# Patient Record
Sex: Female | Born: 1935 | Race: White | Hispanic: No | Marital: Single | Smoking: Former smoker
Health system: Southern US, Community
[De-identification: ages and names within clinical notes are randomized; demographics above are authoritative.]

## PROBLEM LIST (undated history)

## (undated) DIAGNOSIS — H9190 Unspecified hearing loss, unspecified ear: Secondary | ICD-10-CM

## (undated) DIAGNOSIS — R002 Palpitations: Secondary | ICD-10-CM

## (undated) DIAGNOSIS — R413 Other amnesia: Secondary | ICD-10-CM

## (undated) DIAGNOSIS — S32599A Other specified fracture of unspecified pubis, initial encounter for closed fracture: Secondary | ICD-10-CM

## (undated) DIAGNOSIS — M79671 Pain in right foot: Secondary | ICD-10-CM

## (undated) DIAGNOSIS — H3530 Unspecified macular degeneration: Secondary | ICD-10-CM

## (undated) DIAGNOSIS — T63441A Toxic effect of venom of bees, accidental (unintentional), initial encounter: Secondary | ICD-10-CM

## (undated) DIAGNOSIS — E785 Hyperlipidemia, unspecified: Secondary | ICD-10-CM

## (undated) DIAGNOSIS — K5900 Constipation, unspecified: Secondary | ICD-10-CM

## (undated) DIAGNOSIS — I480 Paroxysmal atrial fibrillation: Secondary | ICD-10-CM

## (undated) DIAGNOSIS — M1611 Unilateral primary osteoarthritis, right hip: Secondary | ICD-10-CM

## (undated) DIAGNOSIS — E041 Nontoxic single thyroid nodule: Secondary | ICD-10-CM

## (undated) DIAGNOSIS — I2510 Atherosclerotic heart disease of native coronary artery without angina pectoris: Secondary | ICD-10-CM

## (undated) DIAGNOSIS — R42 Dizziness and giddiness: Secondary | ICD-10-CM

## (undated) DIAGNOSIS — I10 Essential (primary) hypertension: Secondary | ICD-10-CM

## (undated) DIAGNOSIS — M810 Age-related osteoporosis without current pathological fracture: Secondary | ICD-10-CM

## (undated) DIAGNOSIS — K121 Other forms of stomatitis: Secondary | ICD-10-CM

## (undated) HISTORY — DX: Other amnesia: R41.3

## (undated) HISTORY — DX: Nontoxic single thyroid nodule: E04.1

## (undated) HISTORY — DX: Constipation, unspecified: K59.00

## (undated) HISTORY — PX: FOOT SURGERY: SHX648

## (undated) HISTORY — DX: Age-related osteoporosis without current pathological fracture: M81.0

## (undated) HISTORY — DX: Palpitations: R00.2

## (undated) HISTORY — PX: COLONOSCOPY: SHX174

## (undated) HISTORY — DX: Paroxysmal atrial fibrillation: I48.0

## (undated) HISTORY — DX: Hyperlipidemia, unspecified: E78.5

## (undated) HISTORY — DX: Pain in right foot: M79.671

## (undated) HISTORY — DX: Unilateral primary osteoarthritis, right hip: M16.11

## (undated) HISTORY — DX: Essential (primary) hypertension: I10

## (undated) HISTORY — DX: Atherosclerotic heart disease of native coronary artery without angina pectoris: I25.10

## (undated) HISTORY — DX: Unspecified macular degeneration: H35.30

## (undated) HISTORY — DX: Other specified fracture of unspecified pubis, initial encounter for closed fracture: S32.599A

## (undated) HISTORY — PX: KNEE SURGERY: SHX244

## (undated) HISTORY — DX: Other forms of stomatitis: K12.1

---
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
## 1966-11-21 HISTORY — PX: ABDOMINAL HYSTERECTOMY: SHX81

## 2011-07-08 ENCOUNTER — Encounter (HOSPITAL_COMMUNITY): Payer: Medicare Other | Attending: Orthopedic Surgery

## 2011-07-08 DIAGNOSIS — Z79899 Other long term (current) drug therapy: Secondary | ICD-10-CM | POA: Insufficient documentation

## 2011-07-08 DIAGNOSIS — M810 Age-related osteoporosis without current pathological fracture: Secondary | ICD-10-CM | POA: Insufficient documentation

## 2011-07-08 DIAGNOSIS — E7800 Pure hypercholesterolemia, unspecified: Secondary | ICD-10-CM | POA: Insufficient documentation

## 2012-02-16 ENCOUNTER — Other Ambulatory Visit: Payer: Self-pay | Admitting: Family Medicine

## 2012-02-16 DIAGNOSIS — Z1231 Encounter for screening mammogram for malignant neoplasm of breast: Secondary | ICD-10-CM

## 2012-02-28 ENCOUNTER — Ambulatory Visit
Admission: RE | Admit: 2012-02-28 | Discharge: 2012-02-28 | Disposition: A | Discharge status: Home / Self Care | Payer: Medicare Other | Source: Ambulatory Visit | Attending: Family Medicine | Admitting: Family Medicine

## 2012-02-28 DIAGNOSIS — Z1231 Encounter for screening mammogram for malignant neoplasm of breast: Secondary | ICD-10-CM

## 2012-08-01 ENCOUNTER — Other Ambulatory Visit (HOSPITAL_COMMUNITY): Payer: Self-pay | Admitting: *Deleted

## 2012-08-03 ENCOUNTER — Encounter (HOSPITAL_COMMUNITY): Admission: RE | Admit: 2012-08-03 | Payer: Medicare Other | Source: Ambulatory Visit

## 2012-10-04 ENCOUNTER — Ambulatory Visit (HOSPITAL_COMMUNITY)
Admission: RE | Admit: 2012-10-04 | Discharge: 2012-10-04 | Disposition: A | Discharge status: Home / Self Care | Payer: Medicare Other | Source: Ambulatory Visit | Attending: Orthopedic Surgery | Admitting: Orthopedic Surgery

## 2012-10-04 ENCOUNTER — Encounter (HOSPITAL_COMMUNITY): Payer: Self-pay

## 2012-10-04 DIAGNOSIS — M810 Age-related osteoporosis without current pathological fracture: Secondary | ICD-10-CM | POA: Insufficient documentation

## 2012-10-04 DIAGNOSIS — S72009A Fracture of unspecified part of neck of unspecified femur, initial encounter for closed fracture: Secondary | ICD-10-CM | POA: Insufficient documentation

## 2012-10-04 DIAGNOSIS — X58XXXA Exposure to other specified factors, initial encounter: Secondary | ICD-10-CM | POA: Insufficient documentation

## 2012-10-04 DIAGNOSIS — M899 Disorder of bone, unspecified: Secondary | ICD-10-CM | POA: Insufficient documentation

## 2012-10-04 MED ORDER — SODIUM CHLORIDE 0.9 % IV SOLN
Freq: Once | INTRAVENOUS | Status: AC
  Administered 2012-10-04: 12:00:00 via INTRAVENOUS

## 2012-10-04 MED ORDER — ZOLEDRONIC ACID 5 MG/100ML IV SOLN
5.0000 mg | Freq: Once | INTRAVENOUS | Status: AC
  Administered 2012-10-04: 5 mg via INTRAVENOUS
  Filled 2012-10-04: qty 100

## 2012-10-07 ENCOUNTER — Emergency Department (HOSPITAL_COMMUNITY): Payer: Medicare Other

## 2012-10-07 ENCOUNTER — Emergency Department (HOSPITAL_COMMUNITY)
Admission: EM | Admit: 2012-10-07 | Discharge: 2012-10-08 | Disposition: A | Discharge status: Home / Self Care | Payer: Medicare Other | Attending: Emergency Medicine | Admitting: Emergency Medicine

## 2012-10-07 DIAGNOSIS — S81009A Unspecified open wound, unspecified knee, initial encounter: Secondary | ICD-10-CM | POA: Insufficient documentation

## 2012-10-07 DIAGNOSIS — Y9301 Activity, walking, marching and hiking: Secondary | ICD-10-CM | POA: Insufficient documentation

## 2012-10-07 DIAGNOSIS — W1789XA Other fall from one level to another, initial encounter: Secondary | ICD-10-CM | POA: Insufficient documentation

## 2012-10-07 DIAGNOSIS — W19XXXA Unspecified fall, initial encounter: Secondary | ICD-10-CM

## 2012-10-07 DIAGNOSIS — S0003XA Contusion of scalp, initial encounter: Secondary | ICD-10-CM | POA: Insufficient documentation

## 2012-10-07 DIAGNOSIS — Z79899 Other long term (current) drug therapy: Secondary | ICD-10-CM | POA: Insufficient documentation

## 2012-10-07 DIAGNOSIS — S61409A Unspecified open wound of unspecified hand, initial encounter: Secondary | ICD-10-CM | POA: Insufficient documentation

## 2012-10-07 DIAGNOSIS — S46909A Unspecified injury of unspecified muscle, fascia and tendon at shoulder and upper arm level, unspecified arm, initial encounter: Secondary | ICD-10-CM | POA: Insufficient documentation

## 2012-10-07 DIAGNOSIS — IMO0002 Reserved for concepts with insufficient information to code with codable children: Secondary | ICD-10-CM

## 2012-10-07 DIAGNOSIS — Z87891 Personal history of nicotine dependence: Secondary | ICD-10-CM | POA: Insufficient documentation

## 2012-10-07 DIAGNOSIS — S0990XA Unspecified injury of head, initial encounter: Secondary | ICD-10-CM | POA: Insufficient documentation

## 2012-10-07 DIAGNOSIS — S81809A Unspecified open wound, unspecified lower leg, initial encounter: Secondary | ICD-10-CM | POA: Insufficient documentation

## 2012-10-07 DIAGNOSIS — Z23 Encounter for immunization: Secondary | ICD-10-CM | POA: Insufficient documentation

## 2012-10-07 DIAGNOSIS — S5010XA Contusion of unspecified forearm, initial encounter: Secondary | ICD-10-CM | POA: Insufficient documentation

## 2012-10-07 DIAGNOSIS — S4980XA Other specified injuries of shoulder and upper arm, unspecified arm, initial encounter: Secondary | ICD-10-CM | POA: Insufficient documentation

## 2012-10-07 DIAGNOSIS — R51 Headache: Secondary | ICD-10-CM | POA: Insufficient documentation

## 2012-10-07 DIAGNOSIS — Y929 Unspecified place or not applicable: Secondary | ICD-10-CM | POA: Insufficient documentation

## 2012-10-07 DIAGNOSIS — S1093XA Contusion of unspecified part of neck, initial encounter: Secondary | ICD-10-CM | POA: Insufficient documentation

## 2012-10-07 MED ORDER — TETANUS-DIPHTH-ACELL PERTUSSIS 5-2.5-18.5 LF-MCG/0.5 IM SUSP
0.5000 mL | Freq: Once | INTRAMUSCULAR | Status: AC
  Administered 2012-10-07: 0.5 mL via INTRAMUSCULAR
  Filled 2012-10-07: qty 0.5

## 2012-10-07 NOTE — ED Notes (Signed)
Pt complaining of right groin twitching.  Injuries assessed with Dr. Jeraldine Loots, bleeding controlled at present.  Pt alert and oriented X 4.

## 2012-10-07 NOTE — ED Provider Notes (Signed)
History     CSN: [id number]  Arrival date & time 10/07/12  2051   First MD Initiated Contact with Patient 10/07/12 2110      Chief Complaint  Patient presents with  . Fall  . Laceration    (Consider location/radiation/quality/duration/timing/severity/associated sxs/prior treatment) HPI Comments: The patient states she was "racing" down the stairs and lost her footing. She states she slid down 6 stairs and skinned bilateral shins and bruised her arms. She also hit her head. She denies loss of consciousness. She complains of a mild headache but more so throbbing of her left leg.  Patient is a 78 y.o. female presenting with fall and skin laceration. The history is provided by the patient. No language interpreter was used.  Fall The accident occurred 1 to 2 hours ago. The fall occurred while walking. She fell from a height of 3 to 5 ft. She landed on a hard floor. The volume of blood lost was minimal. The point of impact was the head (Bilateral shins and forearms). The pain is present in the head (Head, bilateral shins and forearms, left shoulder). The pain is at a severity of 7/10. The pain is moderate. She was ambulatory at the scene. There was no entrapment after the fall. There was no drug use involved in the accident. There was no alcohol use involved in the accident. Associated symptoms include headaches. Pertinent negatives include no visual change, no fever, no abdominal pain, no bowel incontinence, no nausea, no vomiting and no loss of consciousness. The symptoms are aggravated by pressure on the injury. Treatment on scene includes a c-collar and a backboard. She has tried nothing for the symptoms. The treatment provided no relief.  Laceration     No past medical history on file.  No past surgical history on file.  No family history on file.  History  Substance Use Topics  . Smoking status: Former Smoker -- 0.5 packs/day    Types: Cigarettes    Quit date: 10/05/1971  .  Smokeless tobacco: Not on file  . Alcohol Use: No    OB History    Grav Para Term Preterm Abortions TAB SAB Ect Mult Living                  Review of Systems  Constitutional: Negative for fever, chills, activity change and appetite change.  HENT: Negative for congestion, rhinorrhea, neck pain, neck stiffness and sinus pressure.   Eyes: Negative for discharge and visual disturbance.  Respiratory: Negative for cough, chest tightness, shortness of breath, wheezing and stridor.   Cardiovascular: Negative for chest pain and leg swelling.  Gastrointestinal: Negative for nausea, vomiting, abdominal pain, diarrhea, abdominal distention and bowel incontinence.  Genitourinary: Negative for decreased urine volume and difficulty urinating.  Musculoskeletal: Positive for arthralgias (right shoulder). Negative for back pain.  Skin: Negative for color change and pallor.  Neurological: Positive for headaches. Negative for loss of consciousness, weakness and light-headedness.  Psychiatric/Behavioral: Negative for behavioral problems and agitation.  All other systems reviewed and are negative.    Allergies  Penicillins  Home Medications   Current Outpatient Rx  Name  Route  Sig  Dispense  Refill  . BIOTIN 10 MG PO CAPS   Oral   Take 1 tablet by mouth daily.         Marland Kitchen CALCIUM CARBONATE-VITAMIN D 250-125 MG-UNIT PO TABS   Oral   Take 1 tablet by mouth daily.         . ADULT  MULTIVITAMIN W/MINERALS CH   Oral   Take 1 tablet by mouth daily.         Marland Kitchen PRESERVISION AREDS 2 PO   Oral   Take 2 capsules by mouth daily.         Marland Kitchen SIMVASTATIN 20 MG PO TABS   Oral   Take 20 mg by mouth daily.            BP 173/91  Pulse 79  Temp 98.2 F (36.8 C) (Oral)  Resp 20  SpO2 98%  Physical Exam  Nursing note and vitals reviewed. Constitutional: She is oriented to person, place, and time. She appears well-developed and well-nourished. No distress.  HENT:  Head: Normocephalic.    Mouth/Throat: No oropharyngeal exudate.       Moderate-sized hematoma just above the left lateral eyebrow on the patient's for head. Superficial abrasion over hematoma.  Eyes: EOM are normal. Pupils are equal, round, and reactive to light. Right eye exhibits no discharge. Left eye exhibits no discharge.  Neck: Neck supple. No JVD present.       Immobilizer and c-collar. No tenderness or step-offs.  Cardiovascular: Normal rate, regular rhythm and normal heart sounds.   Pulmonary/Chest: Effort normal and breath sounds normal. No stridor. No respiratory distress. She exhibits no tenderness.  Abdominal: Soft. Bowel sounds are normal. She exhibits no distension. There is no tenderness. There is no guarding.  Musculoskeletal: She exhibits tenderness. She exhibits no edema.       Tenderness over the lateral and anterior left shoulder. Decreased range of motion secondary to pain. Sensation intact throughout. Superficial 2 cm laceration over dorsum of right hand. Hematoma over the mid dorsal left forearm. Superficial abrasion to left elbow. Large 15-20 cm laceration over anterior right lower leg involving soft tissue. 3 cm laceration over anterior right lower leg that is superficial.  Neurological: She is alert and oriented to person, place, and time. No cranial nerve deficit. She exhibits normal muscle tone.  Skin: Skin is warm and dry. No rash noted. She is not diaphoretic.  Psychiatric: She has a normal mood and affect. Her behavior is normal. Judgment and thought content normal.    ED Course  LACERATION REPAIR Performed by: Warrick Parisian Authorized by: Warrick Parisian Consent: Verbal consent obtained. Body area: lower extremity Location details: right lower leg Laceration length: 15 cm Foreign bodies: no foreign bodies Tendon involvement: none Nerve involvement: none Vascular damage: no Anesthesia: local infiltration Local anesthetic: lidocaine 2% without epinephrine Anesthetic total: 25  ml Patient sedated: no Preparation: Patient was prepped and draped in the usual sterile fashion. Irrigation solution: saline Irrigation method: jet lavage Amount of cleaning: extensive Degree of undermining: none Skin closure: 3-0 Prolene Fascia closure: 4-0 Vicryl Number of sutures: 25 Technique: running Approximation: close Approximation difficulty: complex Patient tolerance: Patient tolerated the procedure well with no immediate complications.   (including critical care time)  Labs Reviewed - No data to display Dg Tibia/fibula Left  10/07/2012  *RADIOLOGY REPORT*  Clinical Data: Lacerations and pain at bilateral lower legs post fall  LEFT TIBIA AND FIBULA - 2 VIEW  Comparison: None  Findings: Osseous demineralization. Degenerative changes left knee with chondrocalcinosis question CPPD. Question osteochondral defect at the medial femoral condyle. No acute fracture, dislocation or bone destruction. No definite soft tissue gas or radiopaque foreign body.  IMPRESSION: No acute abnormalities. Osseous demineralization with degenerative changes question CPPD left knee. Question old osteochondral defect at the medial femoral condyle.   Original Report  Authenticated By: Ulyses Southward, M.D.    Dg Tibia/fibula Right  10/07/2012  *RADIOLOGY REPORT*  Clinical Data:  Pain and lacerations at the anterior aspect of bilateral lower legs post fall  RIGHT TIBIA AND FIBULA - 2 VIEW  Comparison: None  Findings: Dressing artifacts at proximal right lower leg and at ankle. Scattered foci of soft tissue gas laterally at the proximal lower leg. Bones appear diffusely demineralized. Chondrocalcinosis at knee. No acute fracture, dislocation, or bone destruction. No radiopaque foreign bodies identified.  IMPRESSION: Osseous demineralization. Soft tissue gas at lateral aspect of proximal right lower leg consistent with laceration history. Chondrocalcinosis at right knee question CPPD.   Original Report Authenticated By:  Ulyses Southward, M.D.    Ct Head Wo Contrast  10/07/2012  *RADIOLOGY REPORT*  Clinical Data:  Fall down stairs, left frontal hematoma  CT HEAD WITHOUT CONTRAST CT CERVICAL SPINE WITHOUT CONTRAST  Technique:  Multidetector CT imaging of the head and cervical spine was performed following the standard protocol without intravenous contrast.  Multiplanar CT image reconstructions of the cervical spine were also generated.  Comparison:  None  CT HEAD  Findings: Generalized atrophy. Normal ventricular morphology. No midline shift or mass effect. Minimal small vessel chronic ischemic changes of deep cerebral white matter. No intracranial hemorrhage, mass lesion or evidence of acute infarction. No definite extra-axial fluid collections. Large left frontal scalp hematoma. Atherosclerotic calcifications at the internal carotid and vertebral arteries at skull base. Bones appear diffusely demineralized. Calvaria appear intact.  IMPRESSION: Atrophy with minimal small vessel chronic ischemic changes of deep cerebral white matter. No acute intracranial abnormalities.  CT CERVICAL SPINE  Findings: Osseous demineralization. Multilevel disc space narrowing and endplate spur formation greatest at C5-C6. Diffuse multilevel facet degenerative changes bilaterally. Visualized skull base intact. Pannus with calcification identified adjacent to the odontoid process. Vertebral body heights maintained without fracture or subluxation. Prevertebral soft tissues normal thickness. Scattered distal calcification. Small bilateral thyroid nodules, with a nodule measuring 12 mm at inferior pole left lobe. Scattered atherosclerotic calcifications of the carotid arteries bilaterally.  IMPRESSION:  Multilevel degenerative disc and facet disease changes of the cervical spine. No acute cervical spine abnormalities. Multiple bilateral thyroid nodules, with at least one nodule measuring 12 mm diameter; follow-up dedicated non emergent thyroid ultrasound  recommended.   Original Report Authenticated By: Ulyses Southward, M.D.    Ct Cervical Spine Wo Contrast  10/07/2012  *RADIOLOGY REPORT*  Clinical Data:  Fall down stairs, left frontal hematoma  CT HEAD WITHOUT CONTRAST CT CERVICAL SPINE WITHOUT CONTRAST  Technique:  Multidetector CT imaging of the head and cervical spine was performed following the standard protocol without intravenous contrast.  Multiplanar CT image reconstructions of the cervical spine were also generated.  Comparison:  None  CT HEAD  Findings: Generalized atrophy. Normal ventricular morphology. No midline shift or mass effect. Minimal small vessel chronic ischemic changes of deep cerebral white matter. No intracranial hemorrhage, mass lesion or evidence of acute infarction. No definite extra-axial fluid collections. Large left frontal scalp hematoma. Atherosclerotic calcifications at the internal carotid and vertebral arteries at skull base. Bones appear diffusely demineralized. Calvaria appear intact.  IMPRESSION: Atrophy with minimal small vessel chronic ischemic changes of deep cerebral white matter. No acute intracranial abnormalities.  CT CERVICAL SPINE  Findings: Osseous demineralization. Multilevel disc space narrowing and endplate spur formation greatest at C5-C6. Diffuse multilevel facet degenerative changes bilaterally. Visualized skull base intact. Pannus with calcification identified adjacent to the odontoid process. Vertebral body heights maintained without  fracture or subluxation. Prevertebral soft tissues normal thickness. Scattered distal calcification. Small bilateral thyroid nodules, with a nodule measuring 12 mm at inferior pole left lobe. Scattered atherosclerotic calcifications of the carotid arteries bilaterally.  IMPRESSION:  Multilevel degenerative disc and facet disease changes of the cervical spine. No acute cervical spine abnormalities. Multiple bilateral thyroid nodules, with at least one nodule measuring 12 mm  diameter; follow-up dedicated non emergent thyroid ultrasound recommended.   Original Report Authenticated By: Ulyses Southward, M.D.    Dg Shoulder Left  10/07/2012  *RADIOLOGY REPORT*  Clinical Data: Fall, laceration, left shoulder pain.  LEFT SHOULDER - 2+ VIEW  Comparison:  None  Findings: Degraded by overlying artifact. No displaced fracture.  A subtle fracture could be obscured by this artifact.  No dislocation of the glenohumeral joint.  The acromioclavicular joint is mildly widened with associated degenerative change.  Left upper lung is predominately clear.  IMPRESSION: Degraded by overlying artifact.  No dislocation or displaced fracture. Consider repeat radiographs if clinical concern for subtle osseous injury persists.  Mild AC joint degenerative change and widening is not favored to be acute however correlate with point tenderness.   Original Report Authenticated By: Jearld Lesch, M.D.      1. Fall   2. Laceration   3. Scalp contusion       MDM  9:25 PM s/p mech fall. Will get CTH, CT csp, Xrays L shoulder, bilat tib fib. Tdap given. Neuro intact, alert. Denies LOC or syncope.  12:36 AM  Tolerated repair well. Imaging neg. proph abx due to depth of R leg wound. Gave strict return and infection precautions. Pt deemed stable for discharge. Return precautions were provided and pt expressed understanding to return to ED if any acute symptoms return. Follow up was instructed which pt also expressed understanding. All questions were answered and pt was in agreement w/ plan.       Warrick Parisian, MD 10/08/12 [phone]

## 2012-10-07 NOTE — ED Notes (Signed)
Pt arrived via [facility] from [facility]. She was walking down stairs when she tripped. Avulsion on right anterior lower extremity, and skin tear on lower left extremity, hmatoma 3 inch diameter on left side forehead with LAC, and skin tear left elbow. Denies N/V, blood thinners or pain unless you touch her. NKDA.

## 2012-10-08 MED ORDER — SODIUM CHLORIDE 0.9 % IV SOLN
Freq: Once | INTRAVENOUS | Status: AC
  Administered 2012-10-08: 01:00:00 via INTRAVENOUS

## 2012-10-08 MED ORDER — HYDROCODONE-ACETAMINOPHEN 5-325 MG PO TABS: 1.0000 | ORAL_TABLET | Freq: Four times a day (QID) | ORAL | Status: DC | PRN

## 2012-10-08 MED ORDER — CEPHALEXIN 250 MG PO CAPS: 250.0000 mg | ORAL_CAPSULE | Freq: Three times a day (TID) | ORAL | Status: AC

## 2012-10-08 NOTE — ED Notes (Signed)
Helped pt to get dressed, IV removed, [facility] called for transport.

## 2012-10-08 NOTE — ED Notes (Signed)
Pt's residence [facility] called, spoke with a nurse, Lynden Ang about injuries and treatments that pt received in the ED.  Asked to assist patient into apartment and check on her throughout the night.

## 2012-10-10 ENCOUNTER — Encounter (HOSPITAL_COMMUNITY): Payer: Self-pay

## 2012-10-10 ENCOUNTER — Emergency Department (HOSPITAL_COMMUNITY)
Admission: EM | Admit: 2012-10-10 | Discharge: 2012-10-10 | Disposition: A | Discharge status: Home / Self Care | Payer: Medicare Other | Attending: Emergency Medicine | Admitting: Emergency Medicine

## 2012-10-10 DIAGNOSIS — X58XXXA Exposure to other specified factors, initial encounter: Secondary | ICD-10-CM | POA: Insufficient documentation

## 2012-10-10 DIAGNOSIS — T8133XA Disruption of traumatic injury wound repair, initial encounter: Secondary | ICD-10-CM | POA: Insufficient documentation

## 2012-10-10 DIAGNOSIS — Z79899 Other long term (current) drug therapy: Secondary | ICD-10-CM | POA: Insufficient documentation

## 2012-10-10 DIAGNOSIS — Y929 Unspecified place or not applicable: Secondary | ICD-10-CM | POA: Insufficient documentation

## 2012-10-10 DIAGNOSIS — Z87891 Personal history of nicotine dependence: Secondary | ICD-10-CM | POA: Insufficient documentation

## 2012-10-10 DIAGNOSIS — T8130XA Disruption of wound, unspecified, initial encounter: Secondary | ICD-10-CM

## 2012-10-10 DIAGNOSIS — Y939 Activity, unspecified: Secondary | ICD-10-CM | POA: Insufficient documentation

## 2012-10-10 NOTE — ED Provider Notes (Signed)
History    This chart was scribed for Cheri Guppy, MD, MD by Smitty Pluck, ED Scribe. The patient was seen in room TR07C and the patient's care was started at 11:30AM.   CSN: [id number]  Arrival date & time 10/10/12  1008     Chief Complaint  Patient presents with  . Repair stitches     (Consider location/radiation/quality/duration/timing/severity/associated sxs/prior treatment) The history is provided by the patient. No language interpreter was used.   Candice Willis is a 78 y.o. female who presents to the Emergency Department due to suture recheck. Pt had sutures placed on right shin 4 days ago. She was worried that some sutures had come out and that it was not healing properly. She denies any fever, chills, nausea, vomiting and any other symptoms.   No past medical history on file.  Past Surgical History  Procedure Date  . Appendectomy   . Abdominal hysterectomy     No family history on file.  History  Substance Use Topics  . Smoking status: Former Smoker -- 0.5 packs/day    Types: Cigarettes    Quit date: 10/05/1971  . Smokeless tobacco: Not on file  . Alcohol Use: Yes     Comment: occasional    OB History    Grav Para Term Preterm Abortions TAB SAB Ect Mult Living                  Review of Systems  Constitutional: Negative for fever and chills.  Respiratory: Negative for shortness of breath.   Gastrointestinal: Negative for nausea and vomiting.  Neurological: Negative for weakness.  All other systems reviewed and are negative.    Allergies  Penicillins  Home Medications   Current Outpatient Rx  Name  Route  Sig  Dispense  Refill  . BIOTIN 10 MG PO CAPS   Oral   Take 1 tablet by mouth daily.         Marland Kitchen CALCIUM CARBONATE-VITAMIN D 250-125 MG-UNIT PO TABS   Oral   Take 1 tablet by mouth daily.         . CEPHALEXIN 250 MG PO CAPS   Oral   Take 1 capsule (250 mg total) by mouth 3 (three) times daily.   15 capsule   0   .  HYDROCODONE-ACETAMINOPHEN 5-325 MG PO TABS   Oral   Take 1 tablet by mouth every 6 (six) hours as needed for pain.   10 tablet   0   . ADULT MULTIVITAMIN W/MINERALS CH   Oral   Take 1 tablet by mouth daily.         Idolina Primer PRESERVISION PO   Oral   Take 1 tablet by mouth 2 (two) times daily.         Marland Kitchen SIMVASTATIN 20 MG PO TABS   Oral   Take 20 mg by mouth every morning.            BP 128/74  Pulse 81  Temp 97.7 F (36.5 C) (Oral)  Resp 18  Ht 5\' 3"  (1.6 m)  Wt 125 lb (56.7 kg)  BMI 22.14 kg/m2  SpO2 97%  Physical Exam  Nursing note and vitals reviewed. Constitutional: She is oriented to person, place, and time. She appears well-developed and well-nourished. No distress.  HENT:  Head: Normocephalic and atraumatic.  Eyes: EOM are normal.  Neck: Neck supple. No tracheal deviation present.  Cardiovascular: Normal rate.   Pulmonary/Chest: Effort normal. No respiratory distress.  Musculoskeletal:  Normal range of motion.  Neurological: She is alert and oriented to person, place, and time.  Skin: Skin is warm and dry.       Healing wound bilaterally  3 stitches have fallen out of laceration on right shin showing slight dehiscence  No evidence of infection   Psychiatric: She has a normal mood and affect. Her behavior is normal.    ED Course  Procedures (including critical care time) DIAGNOSTIC STUDIES: Oxygen Saturation is 97% on room air, normal by my interpretation.    COORDINATION OF CARE: 11:33 AM Discussed ED treatment with pt     Labs Reviewed - No data to display No results found.   No diagnosis found.    MDM  Healing wounds with mild dehiscence in right leg wound.  Dehiscence is several days old. Can't be closed and dose not need closure. No signs infection      I personally performed the services described in this documentation, which was scribed in my presence. The recorded information has been reviewed and is accurate.    Cheri Guppy, MD 10/10/12 [phone]

## 2012-10-10 NOTE — ED Provider Notes (Signed)
  I performed a history and physical examination of Candice Willis and discussed her management with Dr. Ambrose Mantle.  I agree with the history, physical, assessment, and plan of care, with the following exceptions: None  On my exam the patient was in no distress.  There are no neurologic deficits.  The patient did have wounds to all extremities.  With Dr. Ambrose Mantle we repaired her most prominent laceration of her right lower extremity, please see his documentation.  Additionally, the patient required Steri-Strips and gauze dressing of several other wounds.  All procedures were tolerated well.  She was discharged in stable condition.  Elyse Jarvis, MD 10/10/12 [phone]

## 2012-10-10 NOTE — ED Notes (Signed)
Right shin wound has a 2cm part that has no stitches and is open. No drainage or odor noted from wound. Other stitches in place.

## 2012-10-10 NOTE — ED Notes (Signed)
Pt had stitches placed on her right leg and was told by her nurse that she needs to be seen because it looks like the stitches are coming out.

## 2012-10-23 ENCOUNTER — Other Ambulatory Visit: Payer: Self-pay | Admitting: Nurse Practitioner

## 2012-10-23 DIAGNOSIS — E041 Nontoxic single thyroid nodule: Secondary | ICD-10-CM

## 2012-10-25 ENCOUNTER — Ambulatory Visit
Admission: RE | Admit: 2012-10-25 | Discharge: 2012-10-25 | Disposition: A | Discharge status: Home / Self Care | Payer: Medicare Other | Source: Ambulatory Visit | Attending: Nurse Practitioner | Admitting: Nurse Practitioner

## 2012-10-25 DIAGNOSIS — E041 Nontoxic single thyroid nodule: Secondary | ICD-10-CM

## 2012-11-05 ENCOUNTER — Other Ambulatory Visit (HOSPITAL_COMMUNITY): Payer: Self-pay | Admitting: Cardiovascular Disease

## 2012-11-05 DIAGNOSIS — I6529 Occlusion and stenosis of unspecified carotid artery: Secondary | ICD-10-CM

## 2012-12-10 ENCOUNTER — Telehealth (INDEPENDENT_AMBULATORY_CARE_PROVIDER_SITE_OTHER): Payer: Self-pay | Admitting: General Surgery

## 2012-12-10 ENCOUNTER — Encounter (INDEPENDENT_AMBULATORY_CARE_PROVIDER_SITE_OTHER): Payer: Self-pay | Admitting: General Surgery

## 2012-12-10 NOTE — Telephone Encounter (Signed)
Called to req recds again from Dr.Jones office [phone] but no answer and had to leave message to fax them to Candice Willis's attn

## 2012-12-12 ENCOUNTER — Ambulatory Visit (INDEPENDENT_AMBULATORY_CARE_PROVIDER_SITE_OTHER): Payer: Medicare Other | Admitting: General Surgery

## 2012-12-12 ENCOUNTER — Encounter (INDEPENDENT_AMBULATORY_CARE_PROVIDER_SITE_OTHER): Payer: Self-pay | Admitting: General Surgery

## 2012-12-12 VITALS — BP 134/72 | HR 64 | Resp 12 | Ht 63.0 in | Wt 128.0 lb

## 2012-12-12 DIAGNOSIS — E042 Nontoxic multinodular goiter: Secondary | ICD-10-CM

## 2012-12-12 NOTE — Progress Notes (Signed)
Subjective:     Patient ID: Candice Willis, female   DOB: 01-20-1924, 79 y.o.   MRN: [id number]  HPI The patient is an 79 year old female is referred by Dr. Yetta Barre secondary. Thyroid ultrasound which revealed microcalcifications. Patient states she's had no such symptoms of hypo-or hyperthyroidism or dysphagia from breathing.  The patient has no family history of a type of endocrine cancer. Her brothers have had prostate cancer and 1 brother has had laryngeal cancer.. Her sister is breast cancer.  Review of Systems  Constitutional: Negative.   HENT: Negative.   Respiratory: Negative.   Cardiovascular: Negative.   Neurological: Negative.        Objective:   Physical Exam  Constitutional: She is oriented to person, place, and time. She appears well-developed and well-nourished.  HENT:  Head: Normocephalic and atraumatic.  Eyes: Conjunctivae normal and EOM are normal. Pupils are equal, round, and reactive to light.  Neck: Normal range of motion. Neck supple. No mass and no thyromegaly present.  Cardiovascular: Normal rate and normal heart sounds.   Pulmonary/Chest: Effort normal.  Abdominal: Soft.  Musculoskeletal: Normal range of motion.  Neurological: She is alert and oriented to person, place, and time.       Assessment:     A 79 year old female was referred for bilateral thyroid nodules with a calcification. I discussed with the patient possibilities could potentially represent thyroid cancer. However it is fairly nonspecific. We discussed the need for biopsy of these nodules and then plan the  next a based on that.    Plan:     1. We'll schedule the patient for ultrasound-guided FNA of her thyroid nodules.  2. We'll have patient followup after FNA for discussion of the results and further planning. The patient was agreed with this plan.

## 2012-12-13 ENCOUNTER — Telehealth (INDEPENDENT_AMBULATORY_CARE_PROVIDER_SITE_OTHER): Payer: Self-pay

## 2012-12-13 ENCOUNTER — Other Ambulatory Visit (INDEPENDENT_AMBULATORY_CARE_PROVIDER_SITE_OTHER): Payer: Self-pay

## 2012-12-13 DIAGNOSIS — E042 Nontoxic multinodular goiter: Secondary | ICD-10-CM

## 2012-12-13 NOTE — Telephone Encounter (Signed)
Henri at [facility] called stating radiologist has reviewed imaging and request 2 additional orders be put in epic. There are 3 areas he would like to bx. Right upper pole 14mm, Left lower pole 19mm and Left upper pole 23mm. I advised her we will review with Dr Derrell Lolling and I will send request for orders to be put in epic with his assistant Lawson Fiscal. Henri can be reached at [phone].

## 2012-12-13 NOTE — Telephone Encounter (Signed)
Per verbal orders Dr.Ramirez said this was ok and per [facility] LPN she said she had put orders in system and copy in the box

## 2012-12-13 NOTE — Telephone Encounter (Signed)
Per Dr Derrell Lolling 2 other orders placed in epic for multiple thyroid bx.

## 2012-12-18 ENCOUNTER — Ambulatory Visit
Admission: RE | Admit: 2012-12-18 | Discharge: 2012-12-18 | Disposition: A | Discharge status: Home / Self Care | Payer: Medicare Other | Source: Ambulatory Visit | Attending: General Surgery | Admitting: General Surgery

## 2012-12-18 ENCOUNTER — Other Ambulatory Visit (HOSPITAL_COMMUNITY)
Admission: RE | Admit: 2012-12-18 | Discharge: 2012-12-18 | Disposition: A | Discharge status: Home / Self Care | Payer: Medicare Other | Source: Ambulatory Visit | Attending: Interventional Radiology | Admitting: Interventional Radiology

## 2012-12-18 DIAGNOSIS — E049 Nontoxic goiter, unspecified: Secondary | ICD-10-CM | POA: Insufficient documentation

## 2012-12-18 DIAGNOSIS — E042 Nontoxic multinodular goiter: Secondary | ICD-10-CM

## 2012-12-20 ENCOUNTER — Telehealth (INDEPENDENT_AMBULATORY_CARE_PROVIDER_SITE_OTHER): Payer: Self-pay | Admitting: General Surgery

## 2012-12-20 NOTE — Telephone Encounter (Signed)
Called to set up patient's

## 2012-12-20 NOTE — Telephone Encounter (Signed)
Called to set up reck thyroid appt per AR...she is coming in 2/4@11 :20.Marland KitchenMarland Kitchenpt is aware

## 2012-12-25 ENCOUNTER — Encounter (INDEPENDENT_AMBULATORY_CARE_PROVIDER_SITE_OTHER): Payer: Self-pay | Admitting: General Surgery

## 2012-12-25 ENCOUNTER — Ambulatory Visit (INDEPENDENT_AMBULATORY_CARE_PROVIDER_SITE_OTHER): Payer: Medicare Other | Admitting: General Surgery

## 2012-12-25 VITALS — BP 142/78 | HR 80 | Temp 97.9°F | Resp 18 | Ht 63.0 in | Wt 125.2 lb

## 2012-12-25 DIAGNOSIS — E049 Nontoxic goiter, unspecified: Secondary | ICD-10-CM

## 2012-12-25 NOTE — Progress Notes (Signed)
Patient ID: Candice Willis, female   DOB: May 16, 1924, 79 y.o.   MRN: [id number] The patient is a 79 year old female who was referred for thyroid nodule with calcifications ultrasound.  The patient underwent FNA biopsy 2 thyroid nodules which revealed non-neoplastic goiter. The patient again states she has no signs or symptomatology of hyper thyroidism/hypothyroidism, or any compressive symptoms.  As we initially discussed on her first visit the patient like to opening Surgery and considering the patient has no pressing signs for operative intervention patient can continue to be monitored by ultrasound and thyroid function tests. Should the patient's thyroid nodule become symptomatic or become enlarged over the next year or 2 we can rediscuss the possibility of thyroidectomy.  The patient to followup with Dr. Yetta Barre and follow up with her thyroid function tests an thyroid ultrasound on a yearly basis.  The patient follow up when necessary

## 2013-02-13 ENCOUNTER — Encounter (HOSPITAL_COMMUNITY): Payer: Medicare Other

## 2013-02-28 ENCOUNTER — Ambulatory Visit (HOSPITAL_COMMUNITY)
Admission: RE | Admit: 2013-02-28 | Discharge: 2013-02-28 | Disposition: A | Discharge status: Home / Self Care | Payer: Medicare Other | Source: Ambulatory Visit | Attending: Cardiovascular Disease | Admitting: Cardiovascular Disease

## 2013-02-28 DIAGNOSIS — I6529 Occlusion and stenosis of unspecified carotid artery: Secondary | ICD-10-CM | POA: Insufficient documentation

## 2013-02-28 NOTE — Progress Notes (Signed)
Carotid Duplex Imaging Complete Candice Willis 

## 2013-03-27 ENCOUNTER — Encounter (INDEPENDENT_AMBULATORY_CARE_PROVIDER_SITE_OTHER): Payer: Self-pay

## 2013-04-16 ENCOUNTER — Other Ambulatory Visit: Payer: Self-pay | Admitting: Nurse Practitioner

## 2013-04-16 DIAGNOSIS — Z1231 Encounter for screening mammogram for malignant neoplasm of breast: Secondary | ICD-10-CM

## 2013-04-19 ENCOUNTER — Ambulatory Visit
Admission: RE | Admit: 2013-04-19 | Discharge: 2013-04-19 | Disposition: A | Discharge status: Home / Self Care | Payer: Medicare Other | Source: Ambulatory Visit | Attending: Nurse Practitioner | Admitting: Nurse Practitioner

## 2013-04-19 DIAGNOSIS — Z1231 Encounter for screening mammogram for malignant neoplasm of breast: Secondary | ICD-10-CM

## 2014-03-25 ENCOUNTER — Other Ambulatory Visit: Payer: Self-pay

## 2014-04-08 ENCOUNTER — Other Ambulatory Visit: Payer: Self-pay | Admitting: Family Medicine

## 2014-04-08 DIAGNOSIS — M818 Other osteoporosis without current pathological fracture: Secondary | ICD-10-CM

## 2014-04-08 DIAGNOSIS — Z1231 Encounter for screening mammogram for malignant neoplasm of breast: Secondary | ICD-10-CM

## 2014-04-21 ENCOUNTER — Other Ambulatory Visit: Payer: Medicare Other

## 2014-04-21 ENCOUNTER — Ambulatory Visit: Payer: Medicare Other

## 2014-05-08 ENCOUNTER — Ambulatory Visit
Admission: RE | Admit: 2014-05-08 | Discharge: 2014-05-08 | Disposition: A | Discharge status: Home / Self Care | Payer: Medicare Other | Source: Ambulatory Visit | Attending: Family Medicine | Admitting: Family Medicine

## 2014-05-08 DIAGNOSIS — M818 Other osteoporosis without current pathological fracture: Secondary | ICD-10-CM

## 2014-05-08 DIAGNOSIS — Z1231 Encounter for screening mammogram for malignant neoplasm of breast: Secondary | ICD-10-CM

## 2014-10-15 ENCOUNTER — Encounter (HOSPITAL_COMMUNITY): Payer: Self-pay | Admitting: *Deleted

## 2014-10-15 ENCOUNTER — Emergency Department (HOSPITAL_COMMUNITY): Payer: Medicare Other

## 2014-10-15 ENCOUNTER — Emergency Department (HOSPITAL_COMMUNITY)
Admission: EM | Admit: 2014-10-15 | Discharge: 2014-10-15 | Disposition: A | Discharge status: Home / Self Care | Payer: Medicare Other | Attending: Emergency Medicine | Admitting: Emergency Medicine

## 2014-10-15 DIAGNOSIS — M1611 Unilateral primary osteoarthritis, right hip: Secondary | ICD-10-CM

## 2014-10-15 DIAGNOSIS — S32511A Fracture of superior rim of right pubis, initial encounter for closed fracture: Secondary | ICD-10-CM | POA: Insufficient documentation

## 2014-10-15 DIAGNOSIS — Y998 Other external cause status: Secondary | ICD-10-CM | POA: Insufficient documentation

## 2014-10-15 DIAGNOSIS — Y93E1 Activity, personal bathing and showering: Secondary | ICD-10-CM | POA: Insufficient documentation

## 2014-10-15 DIAGNOSIS — E785 Hyperlipidemia, unspecified: Secondary | ICD-10-CM | POA: Insufficient documentation

## 2014-10-15 DIAGNOSIS — Z8739 Personal history of other diseases of the musculoskeletal system and connective tissue: Secondary | ICD-10-CM | POA: Insufficient documentation

## 2014-10-15 DIAGNOSIS — S32591A Other specified fracture of right pubis, initial encounter for closed fracture: Secondary | ICD-10-CM | POA: Diagnosis not present

## 2014-10-15 DIAGNOSIS — Z7982 Long term (current) use of aspirin: Secondary | ICD-10-CM | POA: Diagnosis not present

## 2014-10-15 DIAGNOSIS — Y9289 Other specified places as the place of occurrence of the external cause: Secondary | ICD-10-CM | POA: Insufficient documentation

## 2014-10-15 DIAGNOSIS — S79911A Unspecified injury of right hip, initial encounter: Secondary | ICD-10-CM | POA: Diagnosis present

## 2014-10-15 DIAGNOSIS — I10 Essential (primary) hypertension: Secondary | ICD-10-CM | POA: Insufficient documentation

## 2014-10-15 DIAGNOSIS — Z791 Long term (current) use of non-steroidal anti-inflammatories (NSAID): Secondary | ICD-10-CM | POA: Insufficient documentation

## 2014-10-15 DIAGNOSIS — R52 Pain, unspecified: Secondary | ICD-10-CM

## 2014-10-15 DIAGNOSIS — W19XXXA Unspecified fall, initial encounter: Secondary | ICD-10-CM

## 2014-10-15 DIAGNOSIS — Z79899 Other long term (current) drug therapy: Secondary | ICD-10-CM | POA: Diagnosis not present

## 2014-10-15 DIAGNOSIS — Z87891 Personal history of nicotine dependence: Secondary | ICD-10-CM | POA: Insufficient documentation

## 2014-10-15 DIAGNOSIS — S29091A Other injury of muscle and tendon of front wall of thorax, initial encounter: Secondary | ICD-10-CM | POA: Diagnosis not present

## 2014-10-15 DIAGNOSIS — S32599A Other specified fracture of unspecified pubis, initial encounter for closed fracture: Secondary | ICD-10-CM

## 2014-10-15 DIAGNOSIS — Z880 Allergy status to penicillin: Secondary | ICD-10-CM | POA: Insufficient documentation

## 2014-10-15 DIAGNOSIS — W01198A Fall on same level from slipping, tripping and stumbling with subsequent striking against other object, initial encounter: Secondary | ICD-10-CM | POA: Diagnosis not present

## 2014-10-15 HISTORY — DX: Other specified fracture of unspecified pubis, initial encounter for closed fracture: S32.599A

## 2014-10-15 HISTORY — DX: Unilateral primary osteoarthritis, right hip: M16.11

## 2014-10-15 LAB — CBC WITH DIFFERENTIAL/PLATELET
BASOS ABS: 0 10*3/uL (ref 0.0–0.1)
Basophils Relative: 0 % (ref 0–1)
EOS PCT: 0 % (ref 0–5)
Eosinophils Absolute: 0.1 10*3/uL (ref 0.0–0.7)
HCT: 34.1 % — ABNORMAL LOW (ref 36.0–46.0)
Hemoglobin: 11.7 g/dL — ABNORMAL LOW (ref 12.0–15.0)
LYMPHS ABS: 0.6 10*3/uL — AB (ref 0.7–4.0)
Lymphocytes Relative: 5 % — ABNORMAL LOW (ref 12–46)
MCH: 30.4 pg (ref 26.0–34.0)
MCHC: 34.3 g/dL (ref 30.0–36.0)
MCV: 88.6 fL (ref 78.0–100.0)
Monocytes Absolute: 0.6 10*3/uL (ref 0.1–1.0)
Monocytes Relative: 4 % (ref 3–12)
NEUTROS ABS: 11.2 10*3/uL — AB (ref 1.7–7.7)
NEUTROS PCT: 91 % — AB (ref 43–77)
PLATELETS: 248 10*3/uL (ref 150–400)
RBC: 3.85 MIL/uL — AB (ref 3.87–5.11)
RDW: 13.3 % (ref 11.5–15.5)
WBC: 12.4 10*3/uL — ABNORMAL HIGH (ref 4.0–10.5)

## 2014-10-15 LAB — TYPE AND SCREEN
ABO/RH(D): A POS
ANTIBODY SCREEN: NEGATIVE

## 2014-10-15 LAB — PROTIME-INR
INR: 1.06 (ref 0.00–1.49)
Prothrombin Time: 13.9 seconds (ref 11.6–15.2)

## 2014-10-15 LAB — BASIC METABOLIC PANEL
ANION GAP: 13 (ref 5–15)
BUN: 10 mg/dL (ref 6–23)
CALCIUM: 9.5 mg/dL (ref 8.4–10.5)
CO2: 23 mEq/L (ref 19–32)
Chloride: 99 mEq/L (ref 96–112)
Creatinine, Ser: 0.7 mg/dL (ref 0.50–1.10)
GFR calc Af Amer: 87 mL/min — ABNORMAL LOW (ref >90)
GFR, EST NON AFRICAN AMERICAN: 75 mL/min — AB (ref >90)
GLUCOSE: 114 mg/dL — AB (ref 70–99)
POTASSIUM: 4.4 meq/L (ref 3.7–5.3)
SODIUM: 135 meq/L — AB (ref 137–147)

## 2014-10-15 LAB — ABO/RH: ABO/RH(D): A POS

## 2014-10-15 MED ORDER — MORPHINE SULFATE 4 MG/ML IJ SOLN
4.0000 mg | Freq: Once | INTRAMUSCULAR | Status: AC
  Administered 2014-10-15: 4 mg via INTRAVENOUS
  Filled 2014-10-15: qty 1

## 2014-10-15 MED ORDER — ONDANSETRON HCL 4 MG/2ML IJ SOLN
4.0000 mg | Freq: Once | INTRAMUSCULAR | Status: AC
  Administered 2014-10-15: 4 mg via INTRAVENOUS
  Filled 2014-10-15: qty 2

## 2014-10-15 MED ORDER — DOCUSATE SODIUM 100 MG PO CAPS: 100.0000 mg | ORAL_CAPSULE | Freq: Every day | ORAL | Status: DC

## 2014-10-15 MED ORDER — HYDROCODONE-ACETAMINOPHEN 5-325 MG PO TABS: 1.0000 | ORAL_TABLET | Freq: Four times a day (QID) | ORAL | Status: DC | PRN

## 2014-10-15 NOTE — ED Notes (Signed)
Bed: WA06 Expected date:  Expected time:  Means of arrival:  Comments: EMS fall/htn/hip pain

## 2014-10-15 NOTE — ED Notes (Signed)
[facility]

## 2014-10-15 NOTE — Discharge Instructions (Signed)
Stable Pelvic Fracture °You have one or more fractures (this means there is a break in the bones) of the pelvis. The pelvis is the ring of bones that make up your hipbones. These are the bones you sit on and the lower part of the spine. It is like a boney ring where your legs attach and which supports your upper body. You have an undisplaced fracture. This means the bones are in good position. The pelvic fracture you have is a simple (uncomplicated) fracture. °DIAGNOSIS  °X-rays usually diagnose these fractures. °TREATMENT  °The goal of treating pelvic fractures is to get the bones to heal in a good position. The patient should return to normal activities as soon as possible. Such fractures are often treated with normal bed rest and conservative measures.  °HOME CARE INSTRUCTIONS  °· You should be on bed rest for as long as directed by your caregiver. Change positions of your legs every 1-2 hours to maintain good blood flow. You may sit as long as is tolerable. Following this, you may do usual activities, but avoid strenuous activities for as long as directed by your caregiver. °· Only take over-the-counter or prescription medicines for pain, discomfort, or fever as directed by your caregiver. °· Bed rest may also be used for discomfort. °· Resume your activities when you are able. Use a cane or crutch on the injured side to reduce pain while walking, as needed. °· If you develop increased pain or discomfort not relieved with medications, contact your caregiver. °· Warning: Do not drive a car or operate a motor vehicle until your caregiver specifically tells you it is safe to do so. °SEEK IMMEDIATE MEDICAL CARE IF:  °· You feel light-headed or faint, develop chest pain or shortness of breath. °· An unexplained oral temperature above 102° F (38.9° C) develops. °· You develop blood in the urine or in the stools. °· There is difficulty urinating, and/or having a bowel movement, or pain with these efforts. °· There is a  difficulty or increased pain with walking. °· There is swelling in one or both legs that is not normal. °Document Released: 01/16/2002 Document Revised: 03/24/2014 Document Reviewed: 06/20/2008 °ExitCare® Patient Information ©2015 [employer]. This information is not intended to replace advice given to you by your health care provider. Make sure you discuss any questions you have with your health care provider. ° °

## 2014-10-15 NOTE — ED Notes (Signed)
Per EMS - patient comes from [facility].  She was in the shower at the fitness center and fell getting out of the shower.  Patient c/o right hip pain.  No shortening or rotation noted.  No obvious crepitus or deformity, but patient is exceptionally painful to movement and touch.  Patient denies hitting head or LOC.  Patient denies neck and back pain.  +2 peripheral pulse palpated in RLE in ED.  Patient's vitals, BP 198/107 (hx of same), HR 72, 98% on 2 L [place] (pt is not usually on O2).  Patient received 50 mcg of Fentanyl and 4 mg of Zofran IVP en route.  Patient's pain 0/10 after receiving Fentanyl.

## 2014-10-15 NOTE — ED Notes (Signed)
Pt unable to ambulate with walker because of the pain.

## 2014-10-15 NOTE — Progress Notes (Signed)
CSW received consult that patient arrived in ED from [facility] but would need short-term rehab before returning. CSW confirmed with Katie @ [facility] that they would be able to take her in their SNF. Patient agreeable. Discharge packet given to RN, Vernona RiegerLaura.   Lincoln MaxinKelly Jazlin Tapscott, LCSW [facility] Clinical Social Worker cell #: [phone]

## 2014-10-15 NOTE — ED Provider Notes (Addendum)
CSN: [id number]     Arrival date & time 10/15/14  1045 History   First MD Initiated Contact with Patient 10/15/14 1052     Chief Complaint  Patient presents with  . Hip Pain    post fall    HPI Pt had been swimming for exercise this morning.  She was getting out of the shower, slipped and fell on her right hip.  The pain is not bad when she is not moving but when she does it causes significant pain.  No other injuries.  No LOC.   No chest pain initially although her ribs are a little sore now.  No abdominal pain.  No back pain. Past Medical History  Diagnosis Date  . Hyperlipidemia   . Hypertension   . Osteoporosis    Past Surgical History  Procedure Laterality Date  . Appendectomy    . Abdominal hysterectomy    . Foot surgery      left  . Knee surgery     Family History  Problem Relation Age of Onset  . Heart disease Father   . Cancer Father     prostate  . Hyperlipidemia Sister   . Cancer Sister     breast  . Heart disease Brother   . Hyperlipidemia Brother   . Cancer Brother     prostate  . Cancer Maternal Grandmother     colon  . Heart disease Paternal Grandmother    History  Substance Use Topics  . Smoking status: Former Smoker -- 0.50 packs/day    Types: Cigarettes    Quit date: 10/05/1971  . Smokeless tobacco: Never Used  . Alcohol Use: Yes     Comment: 1-4 per week   OB History    No data available     Review of Systems  All other systems reviewed and are negative.     Allergies  Penicillins  Home Medications   Prior to Admission medications   Medication Sig Start Date End Date Taking? Authorizing Provider  aspirin 81 MG tablet Take 81 mg by mouth daily.   Yes Historical Provider, MD  Biotin 10 MG CAPS Take 1 tablet by mouth daily.   Yes Historical Provider, MD  celecoxib (CELEBREX) 200 MG capsule Take 200 mg by mouth daily.   Yes Historical Provider, MD  Doxylamine Succinate, Sleep, (SLEEP AID PO) Take 1 tablet by mouth at bedtime as  needed (for sleep).   Yes Historical Provider, MD  Fish Oil-Krill Oil (KRILL OIL PLUS) CAPS Take 1 capsule by mouth daily.   Yes Historical Provider, MD  ibuprofen (ADVIL,MOTRIN) 200 MG tablet Take 200 mg by mouth every 6 (six) hours as needed for moderate pain.   Yes Historical Provider, MD  Multiple Vitamin (MULTIVITAMIN WITH MINERALS) TABS Take 1 tablet by mouth daily.   Yes Historical Provider, MD  Multiple Vitamin (VITAMIN E/FOLIC ACID/B-6/B-12) CAPS Take 1 capsule by mouth daily.   Yes Historical Provider, MD  Multiple Vitamins-Minerals (OCUVITE PRESERVISION PO) Take 2 tablets by mouth daily.    Yes Historical Provider, MD  simvastatin (ZOCOR) 40 MG tablet Take 40 mg by mouth daily.   Yes Historical Provider, MD  docusate sodium (COLACE) 100 MG capsule Take 1 capsule (100 mg total) by mouth daily. 10/15/14   Linwood Dibbles, MD  HYDROcodone-acetaminophen (NORCO/VICODIN) 5-325 MG per tablet Take 1-2 tablets by mouth every 6 (six) hours as needed. 10/15/14   Linwood Dibbles, MD   BP 180/70 mmHg  Pulse 76  Temp(Src)  97.8 F (36.6 C) (Oral)  Resp 20  SpO2 99% Physical Exam  Constitutional: She appears well-developed and well-nourished. No distress.  HENT:  Head: Normocephalic and atraumatic.  Right Ear: External ear normal.  Left Ear: External ear normal.  Eyes: Conjunctivae are normal. Right eye exhibits no discharge. Left eye exhibits no discharge. No scleral icterus.  Neck: Neck supple. No tracheal deviation present.  Cardiovascular: Normal rate, regular rhythm and intact distal pulses.   Pulmonary/Chest: Effort normal and breath sounds normal. No accessory muscle usage or stridor. No respiratory distress. She has no wheezes. She has no rales. She exhibits bony tenderness. She exhibits no crepitus.    Abdominal: Soft. Bowel sounds are normal. She exhibits no distension. There is no tenderness. There is no rebound and no guarding.  Musculoskeletal: She exhibits no edema.       Right shoulder:  Normal.       Left shoulder: Normal.       Right elbow: She exhibits normal range of motion and no laceration (small superficial abrasions). No tenderness found.       Right hip: She exhibits tenderness. She exhibits normal range of motion (some pain with external rotation), no swelling, no crepitus and no deformity.       Right knee: Normal.       Right ankle: Normal.       Cervical back: Normal.       Thoracic back: Normal.       Lumbar back: Normal.  Neurological: She is alert. She has normal strength. No cranial nerve deficit (no facial droop, extraocular movements intact, no slurred speech) or sensory deficit. She exhibits normal muscle tone. She displays no seizure activity. Coordination normal.  Skin: Skin is warm and dry. No rash noted.  Psychiatric: She has a normal mood and affect.  Nursing note and vitals reviewed.   ED Course  Procedures (including critical care time) Labs Review Labs Reviewed  BASIC METABOLIC PANEL - Abnormal; Notable for the following:    Sodium 135 (*)    Glucose, Bld 114 (*)    GFR calc non Af Amer 75 (*)    GFR calc Af Amer 87 (*)    All other components within normal limits  CBC WITH DIFFERENTIAL - Abnormal; Notable for the following:    WBC 12.4 (*)    RBC 3.85 (*)    Hemoglobin 11.7 (*)    HCT 34.1 (*)    Neutrophils Relative % 91 (*)    Neutro Abs 11.2 (*)    Lymphocytes Relative 5 (*)    Lymphs Abs 0.6 (*)    All other components within normal limits  PROTIME-INR  TYPE AND SCREEN  ABO/RH    Imaging Review Dg Ribs Unilateral W/chest Right  10/15/2014   CLINICAL DATA:  Previous feel  EXAM: RIGHT RIBS AND CHEST - 3+ VIEW  COMPARISON:  None.  FINDINGS: The lungs are mildly hyperinflated. There is no pneumothorax or pleural effusion. There is increased density in the pulmonary apices bilaterally which not entirely new. The heart and pulmonary vascularity are normal. The mediastinum is normal in width. The trachea is midline.  Right rib  detail films reveal no acute displaced fractures.  IMPRESSION: 1. No acute rib fracture is demonstrated. 2. There is no acute cardiopulmonary abnormality.   Electronically Signed   By: David  Swazil[place]Jordan   On: 10/15/2014 12:16   Dg Hip Complete Right  10/15/2014   CLINICAL DATA:  Right hip pain after slipping  getting out of shower.  EXAM: RIGHT HIP - COMPLETE 2+ VIEW  COMPARISON:  None.  FINDINGS: Mildly displaced and comminuted fractures are seen involving the right inferior and superior pubic rami. These appear to be closed and posttraumatic. No fracture dislocation is seen involving either hip joint. Mild osteophyte formation is seen involving the right hip suggesting mild degenerative joint disease.  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 Mildly displaced and comminuted fractures of the right superior and inferior pubic rami are noted. This is the initial encounter.   Electronically Signed   By: Roque LiasJames  Green M.D.   On: 10/15/2014 12:12    Medications  morphine 4 MG/ML injection 4 mg (4 mg Intravenous Given 10/15/14 1301)  ondansetron (ZOFRAN) injection 4 mg (4 mg Intravenous Given 10/15/14 1300)    MDM   Final diagnoses:  Fracture of multiple pubic rami, right, closed, initial encounter    Pt has a pubic rami fracture.  No surgery is required.  Pt can bear weight on this injury but she will need some assistance while she recovers.  [facility]  Will consult with social work to get her into a higher level of assistance, ie rehab section.  Recommend rolling or standard walker for support.  Norco for pain.  Colace to help prevent constipation.  Follow up with Dr Shon BatonBrooks or her own orthpedist.     Linwood DibblesJon Verena Shawgo, MD 10/15/14 1331  Pt complained of some foot pain when standing.  It was more that the pain shot into her foot.  I have re-examined her foot.  No bruising or swelling.  No ttp of her toes or foot.  Able to squeeze her foot without pain.  Doubt  fracture or injury.  Linwood DibblesJon Yvetta Drotar, MD 10/15/14 [id number]

## 2014-10-15 NOTE — Progress Notes (Signed)
[facility] called for transport.     Willowdean Luhmann, LCSW [facility] Clinical Social Worker cell #: [phone]  

## 2014-10-15 NOTE — ED Notes (Signed)
[facility] in [facility].  Patient states she slipped as she got out and fell on right side.  Patient c/o right hip pain.  No shortening or rotation noted.  +2 RLE pulse palpated.  Lung sounds are clear bilat. No S3/S4 or split noted.  Patient's abdomen of soft and non-tender to palpation.  Bowel sounds are hypoactive, but present.

## 2014-10-20 ENCOUNTER — Non-Acute Institutional Stay (SKILLED_NURSING_FACILITY): Payer: Medicare Other | Admitting: Nurse Practitioner

## 2014-10-20 ENCOUNTER — Encounter: Payer: Self-pay | Admitting: Nurse Practitioner

## 2014-10-20 DIAGNOSIS — S329XXD Fracture of unspecified parts of lumbosacral spine and pelvis, subsequent encounter for fracture with routine healing: Secondary | ICD-10-CM

## 2014-10-20 DIAGNOSIS — M810 Age-related osteoporosis without current pathological fracture: Secondary | ICD-10-CM

## 2014-10-20 DIAGNOSIS — K5900 Constipation, unspecified: Secondary | ICD-10-CM

## 2014-10-20 DIAGNOSIS — K5909 Other constipation: Secondary | ICD-10-CM | POA: Insufficient documentation

## 2014-10-20 DIAGNOSIS — E785 Hyperlipidemia, unspecified: Secondary | ICD-10-CM

## 2014-10-20 DIAGNOSIS — I10 Essential (primary) hypertension: Secondary | ICD-10-CM

## 2014-10-20 DIAGNOSIS — S329XXA Fracture of unspecified parts of lumbosacral spine and pelvis, initial encounter for closed fracture: Secondary | ICD-10-CM | POA: Insufficient documentation

## 2014-10-20 NOTE — Assessment & Plan Note (Addendum)
Takes Colace daily. Large hard BM this am. She desires DulcoLax suppository 10mg  qd prn. Continue to observe.

## 2014-10-20 NOTE — Assessment & Plan Note (Addendum)
Mechanical fall 2//25/15 resulted in R pelvis fx. 05/08/14 last bone density. Will add Ca 1200mg  and Vit D 1000u po daily. Update Vit D level.

## 2014-10-20 NOTE — Progress Notes (Signed)
Patient ID: Candice Willis, female   DOB: 1923-12-10, 80 y.o.   MRN: [id number]   Code Status: DNR  Allergies  Allergen Reactions  . Penicillins Other (See Comments)    Unknown     Chief Complaint  Patient presents with  . Medical Management of Chronic Issues    HPI: Patient is a 80 y.o. female [facility] today for evaluation of s/p ED evaluation for R hip pain sustained from fall  and other chronic medical conditions.    10/15/14 ED evaluation for R hip pain sustained from a mechanical fall at home. Xray R hip/ribs: IMPRESSION:1. No acute rib fracture is demonstrated. 2. 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 Mildly displaced and comminuted fractures of the right superior and inferior pubic rami are noted. This is the initial encounter.    Pt has a pubic rami fracture.  No surgery is required.  Pt can bear weight on this injury but she will need some assistance while she recovers.  SNF for [place].    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 Mildly displaced and comminuted fractures of the right superior and inferior pubic rami are noted. This is the initial encounter.  Shot pain into her right foot. Takes Celebrex 200mg  daily and prn Ibuprofen 200mg  prn and Hydrocodone/Acetaminophen 5/325 q6h prn. Adding Omeprazole 20mg  for GI protection.       Osteoporosis    Mechanical fall 2//25/15 resulted in R pelvis fx. 05/08/14 last bone density. Will add Ca 1200mg  and Vit D 1000u po daily. Update Vit D level.     Hyperlipidemia    Continue Simvastatin 40mg  daily, update lipid panel.     Essential hypertension    Mild elevated Sbp140s, she is asymptomatic, no antihypertensive agent, update CBC, CMP and TSH    Constipation - Primary    Takes Colace daily. Large hard BM this am. She desires DulcoLax suppository 10mg  qd prn. Continue to observe.        Review of Systems:  Review of Systems  Constitutional: Negative for fever, chills, weight loss, malaise/fatigue and diaphoresis.  HENT: Positive for hearing loss. Negative for congestion, ear discharge, ear pain, nosebleeds, sore throat and tinnitus.   Eyes: Negative for blurred vision, double vision, photophobia, pain, discharge and redness.  Respiratory: Negative for cough, hemoptysis, sputum production, shortness of breath, wheezing and stridor.   Cardiovascular: Negative for chest pain, palpitations, orthopnea, claudication, leg swelling and PND.  Gastrointestinal: Positive for heartburn, nausea and constipation. Negative for vomiting, abdominal pain, diarrhea, blood in stool and melena.  Genitourinary: Negative for dysuria, urgency, frequency, hematuria and flank pain.  Musculoskeletal: Positive for joint pain and falls. Negative for myalgias, back pain and neck pain.       Sustained right pelvis fx from a fall at home. Pain is 3-4/10 with weight bearing.   Skin: Negative for itching and rash.       Right elbow bruise  Neurological: Negative for dizziness, tingling, tremors, sensory change, speech change, focal weakness, seizures, loss of consciousness, weakness and headaches.  Endo/Heme/Allergies: Negative for environmental allergies and polydipsia. Does not bruise/bleed easily.  Psychiatric/Behavioral: Negative for depression, suicidal ideas, hallucinations, memory loss and substance abuse. The patient is not  nervous/anxious and does not have insomnia.        Mildly confusion of time and place.       Past Medical History  Diagnosis Date  . Hyperlipidemia   . Hypertension   . Osteoporosis    Past Surgical History  Procedure Laterality Date  . Appendectomy    . Abdominal hysterectomy    . Foot surgery      left  . Knee surgery      Social History:   reports that she quit smoking about 43 years ago. Her smoking use included Cigarettes. She smoked 0.50 packs per day. She has never used smokeless tobacco. She reports that she drinks alcohol. She reports that she does not use illicit drugs.  Family History  Problem Relation Age of Onset  . Heart disease Father   . Cancer Father     prostate  . Hyperlipidemia Sister   . Cancer Sister     breast  . Heart disease Brother   . Hyperlipidemia Brother   . Cancer Brother     prostate  . Cancer Maternal Grandmother     colon  . Heart disease Paternal Grandmother     Medications: Patient's Medications  New Prescriptions   No medications on file  Previous Medications   ASPIRIN 81 MG TABLET    Take 81 mg by mouth daily.   BIOTIN 10 MG CAPS    Take 1 tablet by mouth daily.   CELECOXIB (CELEBREX) 200 MG CAPSULE    Take 200 mg by mouth daily.   DOCUSATE SODIUM (COLACE) 100 MG CAPSULE    Take 1 capsule (100 mg total) by mouth daily.   DOXYLAMINE SUCCINATE, SLEEP, (SLEEP AID PO)    Take 1 tablet by mouth at bedtime as needed (for sleep).   FISH OIL-KRILL OIL (KRILL OIL PLUS) CAPS    Take 1 capsule by mouth daily.   HYDROCODONE-ACETAMINOPHEN (NORCO/VICODIN) 5-325 MG PER TABLET    Take 1-2 tablets by mouth every 6 (six) hours as needed.   IBUPROFEN (ADVIL,MOTRIN) 200 MG TABLET    Take 200 mg by mouth every 6 (six) hours as needed for moderate pain.   MULTIPLE VITAMIN (MULTIVITAMIN WITH MINERALS) TABS    Take 1 tablet by mouth daily.   MULTIPLE VITAMIN (VITAMIN E/FOLIC ACID/B-6/B-12) CAPS    Take 1 capsule by mouth daily.   MULTIPLE VITAMINS-MINERALS (OCUVITE PRESERVISION PO)    Take 2 tablets by mouth daily.    SIMVASTATIN (ZOCOR) 40 MG TABLET    Take 40 mg by mouth daily.  Modified Medications   No medications on file  Discontinued Medications   No medications on file     Physical Exam: Physical Exam  Constitutional: She appears well-developed and well-nourished. No  distress.  HENT:  Head: Normocephalic and atraumatic.  Right Ear: External ear normal.  Left Ear: External ear normal.  Nose: Nose normal.  Mouth/Throat: Oropharynx is clear and moist. No oropharyngeal exudate.  Eyes: Conjunctivae and EOM are normal. Pupils are equal, round, and reactive to light. Right eye exhibits no discharge. Left eye exhibits no discharge. No scleral icterus.  Neck: Normal range of motion. Neck supple. No JVD present. No tracheal deviation present. No thyromegaly present.  Cardiovascular: Normal rate, regular rhythm and intact distal pulses.  Exam reveals no gallop and no friction rub.   No murmur heard. Pulmonary/Chest: Effort normal and breath sounds normal. No stridor. No respiratory distress. She has no wheezes. She has no rales. She exhibits  no tenderness.  Abdominal: Soft. Bowel sounds are normal. She exhibits no distension and no mass. There is no tenderness. There is no rebound and no guarding.  Musculoskeletal: She exhibits tenderness. She exhibits no edema.  Right hip ROM with pain and weight bearing. Also c/o right sided rib cage pain when palpated but not with deep breath or truncal movement.   Lymphadenopathy:    She has no cervical adenopathy.  Neurological: She is alert. She displays normal reflexes. No cranial nerve deficit. She exhibits normal muscle tone. Coordination normal.  Skin: Skin is warm and dry. No rash noted. She is not diaphoretic. No erythema. No pallor.  Right elbow abrasion  Psychiatric: She has a normal mood and affect. Her behavior is normal. Judgment and thought content normal.  Thought it was morning around 4pm upon my visit. Also confused her bed and bathroom.     Filed Vitals:   10/20/14 1036  BP: 144/87  Pulse: 76  Temp: 97.4 F (36.3 C)  TempSrc: Tympanic  Resp: 16      Labs reviewed: Basic Metabolic Panel:  Recent Labs  16/10/96 1208  NA 135*  K 4.4  CL 99  CO2 23  GLUCOSE 114*  BUN 10  CREATININE 0.70    CALCIUM 9.5   Liver Function Tests: No results for input(s): AST, ALT, ALKPHOS, BILITOT, PROT, ALBUMIN in the last 8760 hours. No results for input(s): LIPASE, AMYLASE in the last 8760 hours. No results for input(s): AMMONIA in the last 8760 hours. CBC:  Recent Labs  10/15/14 1208  WBC 12.4*  NEUTROABS 11.2*  HGB 11.7*  HCT 34.1*  MCV 88.6  PLT 248   Lipid Panel: No results for input(s): CHOL, HDL, LDLCALC, TRIG, CHOLHDL, LDLDIRECT in the last 8760 hours.  Past Procedures:  10/15/14 X-ray   R rib: IMPRESSION:1. No acute rib fracture is demonstrated. 2. There is no acute cardiopulmonary abnormality.  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 Mildly displaced and comminuted fractures of the right superior and inferior pubic rami are noted. This is the initial encounter.  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 Mildly displaced and comminuted fractures of the right superior and inferior pubic rami are noted. This is the initial encounter.  Shot pain into her right foot. Takes Celebrex 200mg  daily and prn Ibuprofen 200mg  prn and Hydrocodone/Acetaminophen 5/325 q6h prn. Adding Omeprazole 20mg  for GI protection.     Hyperlipidemia Continue Simvastatin 40mg  daily, update lipid panel.   Essential hypertension Mild elevated Sbp140s, she is asymptomatic, no antihypertensive agent, update CBC, CMP and TSH  Osteoporosis Mechanical fall 2//25/15 resulted in R pelvis fx. 05/08/14 last bone density. Will add Ca 1200mg  and Vit D 1000u po daily. Update Vit D level.   Constipation Takes Colace daily. Large hard BM this am. She desires DulcoLax suppository 10mg  qd prn. Continue to observe.     Family/ Staff Communication: observe the patient  Goals of Care: [place]  Labs/tests ordered: CBC, CMP, TSH, Vit D, and lipid panel.

## 2014-10-20 NOTE — Assessment & Plan Note (Signed)
Mild elevated Sbp140s, she is asymptomatic, no antihypertensive agent, update CBC, CMP and TSH

## 2014-10-20 NOTE — Assessment & Plan Note (Addendum)
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 Mildly displaced and comminuted fractures of the right superior and inferior pubic rami are noted. This is the initial encounter.  Shot pain into her right foot. Takes Celebrex 200mg  daily and prn Ibuprofen 200mg  prn and Hydrocodone/Acetaminophen 5/325 q6h prn. Adding Omeprazole 20mg  for GI protection.

## 2014-10-20 NOTE — Assessment & Plan Note (Signed)
Continue Simvastatin 40mg  daily, update lipid panel.

## 2014-10-21 LAB — CBC AND DIFFERENTIAL
HEMATOCRIT: 32 % — AB (ref 36–46)
Hemoglobin: 11.2 g/dL — AB (ref 12.0–16.0)
Platelets: 259 10*3/uL (ref 150–399)
WBC: 6.1 10^3/mL

## 2014-10-21 LAB — TSH: TSH: 2.12 u[IU]/mL (ref 0.41–5.90)

## 2014-10-21 LAB — LIPID PANEL
Cholesterol: 138 mg/dL (ref 0–200)
HDL: 62 mg/dL (ref 35–70)
LDL Cholesterol: 62 mg/dL
TRIGLYCERIDES: 69 mg/dL (ref 40–160)

## 2014-10-21 LAB — HEPATIC FUNCTION PANEL
ALT: 24 U/L (ref 7–35)
AST: 27 U/L (ref 13–35)
Alkaline Phosphatase: 63 U/L (ref 25–125)
BILIRUBIN, TOTAL: 0.9 mg/dL

## 2014-10-21 LAB — BASIC METABOLIC PANEL
BUN: 16 mg/dL (ref 4–21)
CREATININE: 0.6 mg/dL (ref 0.5–1.1)
GLUCOSE: 87 mg/dL
Potassium: 4.4 mmol/L (ref 3.4–5.3)
SODIUM: 133 mmol/L — AB (ref 137–147)

## 2014-10-22 ENCOUNTER — Other Ambulatory Visit: Payer: Self-pay | Admitting: Nurse Practitioner

## 2014-10-22 DIAGNOSIS — M810 Age-related osteoporosis without current pathological fracture: Secondary | ICD-10-CM

## 2014-10-22 DIAGNOSIS — E785 Hyperlipidemia, unspecified: Secondary | ICD-10-CM

## 2014-10-23 ENCOUNTER — Non-Acute Institutional Stay (SKILLED_NURSING_FACILITY): Payer: Medicare Other | Admitting: Internal Medicine

## 2014-10-23 DIAGNOSIS — K121 Other forms of stomatitis: Secondary | ICD-10-CM

## 2014-10-23 DIAGNOSIS — S329XXD Fracture of unspecified parts of lumbosacral spine and pelvis, subsequent encounter for fracture with routine healing: Secondary | ICD-10-CM

## 2014-10-23 DIAGNOSIS — R413 Other amnesia: Secondary | ICD-10-CM

## 2014-10-23 DIAGNOSIS — E041 Nontoxic single thyroid nodule: Secondary | ICD-10-CM

## 2014-10-23 DIAGNOSIS — E559 Vitamin D deficiency, unspecified: Secondary | ICD-10-CM

## 2014-10-23 DIAGNOSIS — M79671 Pain in right foot: Secondary | ICD-10-CM

## 2014-10-24 ENCOUNTER — Encounter: Payer: Self-pay | Admitting: Internal Medicine

## 2014-10-24 DIAGNOSIS — M79671 Pain in right foot: Secondary | ICD-10-CM | POA: Insufficient documentation

## 2014-10-24 DIAGNOSIS — F0150 Vascular dementia without behavioral disturbance: Secondary | ICD-10-CM | POA: Insufficient documentation

## 2014-10-24 DIAGNOSIS — E559 Vitamin D deficiency, unspecified: Secondary | ICD-10-CM | POA: Insufficient documentation

## 2014-10-24 DIAGNOSIS — K121 Other forms of stomatitis: Secondary | ICD-10-CM | POA: Insufficient documentation

## 2014-10-24 DIAGNOSIS — E041 Nontoxic single thyroid nodule: Secondary | ICD-10-CM | POA: Insufficient documentation

## 2014-10-24 DIAGNOSIS — R413 Other amnesia: Secondary | ICD-10-CM | POA: Insufficient documentation

## 2014-10-24 NOTE — Progress Notes (Signed)
Patient ID: Candice Willis, female   DOB: 30-Mar-1924, 80 y.o.   MRN: [id number]    HISTORY AND PHYSICAL  Location:  [facility]  Nursing Home Room Number: 14 Place of Service: SNF (31)   Extended Emergency Contact Information Primary Emergency Contact: Clark,Diana  [place] Home Phone: [phone] Relation: Friend  Advanced Directive information  unknown  Chief Complaint  Patient presents with  . New Admit To SNF    following ER 11/125/15 after fall with mildly displaced and comminuted fracture of the right superior and inferior pubic rami.    HPI:  This patient was seen in the emergency room 10/15/14 after a fall. Evaluation revealed a diagnoses of mildly displaced and comminuted fractures involving the right inferior and right superior pubic rami. Incidental x-ray findings also included DJD of the right hip with osteophytes  Multiple other chronic problems include hypertension, osteoporosis, hyperlipidemia, paroxysmal atrial fibrillation, arteriosclerotic cardiovascular disease, macular degeneration, palpitations, constipation, pain in the right foot, mild memory deficit, and a history of a thyroid nodule with surgery. She now complains of ulcers on her hard palate.  Labs since admission showed relatively low vitamin D at 30 and a hemoglobin of 11.2 with a normal MCV of 86.6. Lab was otherwise close to normal.  Past Medical History  Diagnosis Date  . Hyperlipidemia   . Hypertension   . Osteoporosis   . Macular degeneration   . Paroxysmal atrial fibrillation   . Arteriosclerotic cardiovascular disease   . Palpitations   . Constipation   . Memory deficit   . Ulcer of hard palate   . Pain in right foot   . Thyroid nodule     Status post surgery  . Fracture of multiple pubic rami 10/15/14    Right inferior and superior pubic rami  . Degenerative joint disease of right hip 10/15/14    Past Surgical History  Procedure Laterality Date  .  Appendectomy    . Abdominal hysterectomy    . Foot surgery      left  . Knee surgery      Patient Care Team: Paulino Rily, MD as PCP - General (Family Medicine) Venita Lick, MD as Consulting Physician (Orthopedic Surgery) Marykay Lex, MD as Consulting Physician (Cardiology)  History   Social History  . Marital Status: Single    Spouse Name: N/A    Number of Children: N/A  . Years of Education: N/A   Occupational History  . Not on file.   Social History Main Topics  . Smoking status: Former Smoker -- 0.50 packs/day    Types: Cigarettes    Quit date: 10/05/1971  . Smokeless tobacco: Never Used  . Alcohol Use: Yes     Comment: 1-4 per week  . Drug Use: No  . Sexual Activity: Not on file   Other Topics Concern  . Not on file   Social History Narrative     reports that she quit smoking about 43 years ago. Her smoking use included Cigarettes. She smoked 0.50 packs per day. She has never used smokeless tobacco. She reports that she drinks alcohol. She reports that she does not use illicit drugs.  Family History  Problem Relation Age of Onset  . Heart disease Father   . Cancer Father     prostate  . Hyperlipidemia Sister   . Cancer Sister     breast  . Heart disease Brother   . Hyperlipidemia Brother   . Cancer Brother  prostate  . Cancer Maternal Grandmother     colon  . Heart disease Paternal Grandmother    Family Status  Relation Status Death Age  . Father Deceased   . Mother Deceased     Immunization History  Administered Date(s) Administered  . Tdap 10/07/2012    Allergies  Allergen Reactions  . Penicillins Other (See Comments)    Unknown     Medications: Patient's Medications  New Prescriptions   No medications on file  Previous Medications   ASPIRIN 81 MG TABLET    Take 81 mg by mouth daily.   BIOTIN 10 MG CAPS    Take 1 tablet by mouth daily.   CELECOXIB (CELEBREX) 200 MG CAPSULE    Take 200 mg by mouth daily.   DOCUSATE  SODIUM (COLACE) 100 MG CAPSULE    Take 1 capsule (100 mg total) by mouth daily.   DOXYLAMINE SUCCINATE, SLEEP, (SLEEP AID PO)    Take 1 tablet by mouth at bedtime as needed (for sleep).   FISH OIL-KRILL OIL (KRILL OIL PLUS) CAPS    Take 1 capsule by mouth daily.   HYDROCODONE-ACETAMINOPHEN (NORCO/VICODIN) 5-325 MG PER TABLET    Take 1-2 tablets by mouth every 6 (six) hours as needed.   IBUPROFEN (ADVIL,MOTRIN) 200 MG TABLET    Take 200 mg by mouth every 6 (six) hours as needed for moderate pain.   MULTIPLE VITAMIN (MULTIVITAMIN WITH MINERALS) TABS    Take 1 tablet by mouth daily.   MULTIPLE VITAMIN (VITAMIN E/FOLIC WUJW/J-1/B-14) CAPS    Take 1 capsule by mouth daily.   MULTIPLE VITAMINS-MINERALS (OCUVITE PRESERVISION PO)    Take 2 tablets by mouth daily.    SIMVASTATIN (ZOCOR) 40 MG TABLET    Take 40 mg by mouth daily.  Modified Medications   No medications on file  Discontinued Medications   No medications on file    Review of Systems  Constitutional: Negative for fever, chills, diaphoresis, activity change, appetite change, fatigue and unexpected weight change.  HENT: Negative for congestion, ear discharge, ear pain, hearing loss, postnasal drip, rhinorrhea, sore throat, tinnitus, trouble swallowing and voice change.        Complains of sores on the roof of the mouth.  Eyes: Negative for pain, redness, itching and visual disturbance.  Respiratory: Negative for cough, choking, shortness of breath and wheezing.   Cardiovascular: Negative for chest pain, palpitations and leg swelling.  Gastrointestinal: Negative for nausea, abdominal pain, diarrhea, constipation and abdominal distention.  Endocrine: Negative for cold intolerance, heat intolerance, polydipsia, polyphagia and polyuria.  Genitourinary: Negative for dysuria, urgency, frequency, hematuria, flank pain, vaginal discharge, difficulty urinating and pelvic pain.  Musculoskeletal: Negative for myalgias, back pain, arthralgias, gait  problem, neck pain and neck stiffness.       Tender in the right groin and pubis  Skin: Negative for color change, pallor and rash.  Allergic/Immunologic: Negative.   Neurological: Negative for dizziness, tremors, seizures, syncope, weakness, numbness and headaches.       Mild confusion and forgetfulness.  Hematological: Negative for adenopathy. Does not bruise/bleed easily.  Psychiatric/Behavioral: Negative for suicidal ideas, hallucinations, behavioral problems, confusion, sleep disturbance, dysphoric mood and agitation. The patient is not nervous/anxious and is not hyperactive.     Filed Vitals:   10/24/14 1558  BP: 154/68  Pulse: 78  Temp: 96.8 F (36 C)  Resp: 16  Weight: 129 lb (58.514 kg)   Body mass index is 22.86 kg/(m^2).  Physical Exam  Constitutional: She  is oriented to person, place, and time. She appears well-developed and well-nourished. No distress.  HENT:  Right Ear: External ear normal.  Left Ear: External ear normal.  Nose: Nose normal.  Mouth/Throat: Oropharynx is clear and moist. No oropharyngeal exudate.  Multiple small ulcerations on the hard palate  Eyes: Conjunctivae and EOM are normal. Pupils are equal, round, and reactive to light. No scleral icterus.  Neck: No JVD present. No tracheal deviation present. No thyromegaly present.  Cardiovascular: Normal rate, regular rhythm, normal heart sounds and intact distal pulses.  Exam reveals no gallop and no friction rub.   No murmur heard. Pulmonary/Chest: Effort normal. No respiratory distress. She has no wheezes. She has no rales. She exhibits no tenderness.  Abdominal: She exhibits no distension and no mass. There is no tenderness.  Musculoskeletal: Normal range of motion. She exhibits no edema or tenderness.  Tender in the right groin and pubis  Lymphadenopathy:    She has no cervical adenopathy.  Neurological: She is alert and oriented to person, place, and time. No cranial nerve deficit. Coordination  normal.  Mild confusion and forgetfulness  Skin: No rash noted. She is not diaphoretic. No erythema. No pallor.  Psychiatric: She has a normal mood and affect. Her behavior is normal. Judgment and thought content normal.     Labs reviewed: Lab on 10/22/2014  Component Date Value Ref Range Status  . Hemoglobin 10/21/2014 11.2* 12.0 - 16.0 g/dL Final  . HCT 10/21/2014 32* 36 - 46 % Final  . Platelets 10/21/2014 259  150 - 399 K/L Final  . WBC 10/21/2014 6.1   Final  . Glucose 10/21/2014 87   Final  . BUN 10/21/2014 16  4 - 21 mg/dL Final  . Creatinine 10/21/2014 0.6  0.5 - 1.1 mg/dL Final  . Potassium 10/21/2014 4.4  3.4 - 5.3 mmol/L Final  . Sodium 10/21/2014 133* 137 - 147 mmol/L Final  . Triglycerides 10/21/2014 69  40 - 160 mg/dL Final  . Cholesterol 10/21/2014 138  0 - 200 mg/dL Final  . HDL 10/21/2014 62  35 - 70 mg/dL Final  . LDL Cholesterol 10/21/2014 62   Final  . Alkaline Phosphatase 10/21/2014 63  25 - 125 U/L Final  . ALT 10/21/2014 24  7 - 35 U/L Final  . AST 10/21/2014 27  13 - 35 U/L Final  . Bilirubin, Total 10/21/2014 0.9   Final  . TSH 10/21/2014 2.12  0.41 - 5.90 uIU/mL Final  Admission on 10/15/2014, Discharged on 10/15/2014  Component Date Value Ref Range Status  . Sodium 10/15/2014 135* 137 - 147 mEq/L Final  . Potassium 10/15/2014 4.4  3.7 - 5.3 mEq/L Final  . Chloride 10/15/2014 99  96 - 112 mEq/L Final  . CO2 10/15/2014 23  19 - 32 mEq/L Final  . Glucose, Bld 10/15/2014 114* 70 - 99 mg/dL Final  . BUN 10/15/2014 10  6 - 23 mg/dL Final  . Creatinine, Ser 10/15/2014 0.70  0.50 - 1.10 mg/dL Final  . Calcium 10/15/2014 9.5  8.4 - 10.5 mg/dL Final  . GFR calc non Af Amer 10/15/2014 75* >90 mL/min Final  . GFR calc Af Amer 10/15/2014 87* >90 mL/min Final   Comment: (NOTE) The eGFR has been calculated using the CKD EPI equation. This calculation has not been validated in all clinical situations. eGFR's persistently <90 mL/min signify possible Chronic  Kidney Disease.   . Anion gap 10/15/2014 13  5 - 15 Final  . WBC 10/15/2014  12.4* 4.0 - 10.5 K/uL Final  . RBC 10/15/2014 3.85* 3.87 - 5.11 MIL/uL Final  . Hemoglobin 10/15/2014 11.7* 12.0 - 15.0 g/dL Final  . HCT 10/15/2014 34.1* 36.0 - 46.0 % Final  . MCV 10/15/2014 88.6  78.0 - 100.0 fL Final  . MCH 10/15/2014 30.4  26.0 - 34.0 pg Final  . MCHC 10/15/2014 34.3  30.0 - 36.0 g/dL Final  . RDW 10/15/2014 13.3  11.5 - 15.5 % Final  . Platelets 10/15/2014 248  150 - 400 K/uL Final  . Neutrophils Relative % 10/15/2014 91* 43 - 77 % Final  . Neutro Abs 10/15/2014 11.2* 1.7 - 7.7 K/uL Final  . Lymphocytes Relative 10/15/2014 5* 12 - 46 % Final  . Lymphs Abs 10/15/2014 0.6* 0.7 - 4.0 K/uL Final  . Monocytes Relative 10/15/2014 4  3 - 12 % Final  . Monocytes Absolute 10/15/2014 0.6  0.1 - 1.0 K/uL Final  . Eosinophils Relative 10/15/2014 0  0 - 5 % Final  . Eosinophils Absolute 10/15/2014 0.1  0.0 - 0.7 K/uL Final  . Basophils Relative 10/15/2014 0  0 - 1 % Final  . Basophils Absolute 10/15/2014 0.0  0.0 - 0.1 K/uL Final  . Prothrombin Time 10/15/2014 13.9  11.6 - 15.2 seconds Final  . INR 10/15/2014 1.06  0.00 - 1.49 Final  . ABO/RH(D) 10/15/2014 A POS   Final  . Antibody Screen 10/15/2014 NEG   Final  . Sample Expiration 10/15/2014 10/18/2014   Final  . ABO/RH(D) 10/15/2014 A POS   Final    Dg Ribs Unilateral W/chest Right  10/15/2014   CLINICAL DATA:  Previous feel  EXAM: RIGHT RIBS AND CHEST - 3+ VIEW  COMPARISON:  None.  FINDINGS: The lungs are mildly hyperinflated. There is no pneumothorax or pleural effusion. There is increased density in the pulmonary apices bilaterally which not entirely new. The heart and pulmonary vascularity are normal. The mediastinum is normal in width. The trachea is midline.  Right rib detail films reveal no acute displaced fractures.  IMPRESSION: 1. No acute rib fracture is demonstrated. 2. There is no acute cardiopulmonary abnormality.   Electronically  Signed   By: David  Martinique   On: 10/15/2014 12:16   Dg Hip Complete Right  10/15/2014   CLINICAL DATA:  Right hip pain after slipping getting out of shower.  EXAM: RIGHT HIP - COMPLETE 2+ VIEW  COMPARISON:  None.  FINDINGS: Mildly displaced and comminuted fractures are seen involving the right inferior and superior pubic rami. These appear to be closed and posttraumatic. No fracture dislocation is seen involving either hip joint. Mild osteophyte formation is seen involving the right hip suggesting mild degenerative joint disease.  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 Mildly displaced and comminuted fractures of the right superior and inferior pubic rami are noted. This is the initial encounter.   Electronically Signed   By: Sabino Dick M.D.   On: 10/15/2014 12:12     Assessment/Plan  1. Pelvis fracture, right, with routine healing, subsequent encounter Patient is currently in skilled nursing facility for short-term rehabilitation. Goals are for safe ambulation, strengthening, self-sufficiency.  2. Ulcer of hard palate Etiology uncertain. Magic mouthwash started.  3. Memory deficit Observe. Not currently medicated. Unclear if this is Alzheimer's or vascular dementia.  4. Pain in right foot Chronic. Wears arch supports.  5. Thyroid nodule Remote history. She is unable to date when it was present. She says she has had  surgery to remove it in the past.  6. Vitamin D deficiency Vitamin D 2000 units daily started. Omeprazole changed to ranitidine because of potential drug effect to reduce vitamin D.

## 2014-10-27 ENCOUNTER — Encounter: Payer: Self-pay | Admitting: Nurse Practitioner

## 2014-10-27 ENCOUNTER — Non-Acute Institutional Stay (SKILLED_NURSING_FACILITY): Payer: Medicare Other | Admitting: Nurse Practitioner

## 2014-10-27 DIAGNOSIS — I10 Essential (primary) hypertension: Secondary | ICD-10-CM

## 2014-10-27 DIAGNOSIS — K121 Other forms of stomatitis: Secondary | ICD-10-CM

## 2014-10-27 DIAGNOSIS — K5900 Constipation, unspecified: Secondary | ICD-10-CM

## 2014-10-27 DIAGNOSIS — S329XXS Fracture of unspecified parts of lumbosacral spine and pelvis, sequela: Secondary | ICD-10-CM

## 2014-10-27 NOTE — Progress Notes (Signed)
Patient ID: Candice Willis, female   DOB: [birth year], 80 y.o.   MRN: [id number]   Code Status: DNR  Allergies  Allergen Reactions  . Penicillins Other (See Comments)    Unknown     Chief Complaint  Patient presents with  . Medical Management of Chronic Issues  . Acute Visit    c/o the roof of her mouth is sore.     HPI: Patient is a 80 y.o. female seen in the SNF at [facility] today for evaluation of soreness the roof of mouth  and other chronic medical conditions.    10/15/14 ED evaluation for R hip pain sustained from a mechanical fall at home. Xray R hip/ribs: IMPRESSION:1. No acute rib fracture is demonstrated. 2. 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 Mildly displaced and comminuted fractures of the right superior and inferior pubic rami are noted. This is the initial encounter.    Pt has a pubic rami fracture.  No surgery is required.  Pt can bear weight on this injury but she will need some assistance while she recovers.  SNF for Rehab and [place].    Problem List Items Addressed This Visit    Stomatitis - Primary    Will try Magic mouth wash 5ml s/s ac and hs x 2 weeks.     Pelvis fracture, right    10/15/14 X-ray   R hip: IMPRESSION:Mild degenerative joint disease of the right hip is noted. Mildly displaced and comminuted fractures of the right superior and inferior pubic rami are noted. This is the initial encounter.  Shot pain into her right foot. Takes Mobic 7.5mg  daily and prn Ibuprofen 200mg  q6h prn and Hydrocodone/Acetaminophen 5/325 q6h prn(jused most 2x/day). Continue Omeprazole 20mg  for GI protection.        Essential hypertension    Mild elevated Sbp140s, she is asymptomatic, no antihypertensive agent, takes ASA and and Atorvastatin for risk reduction.     Constipation    Takes Colace daily. DulcoLax suppository 10mg  qd prn. Continue  to observe.         Review of Systems:  Review of Systems  Constitutional: Negative for fever, chills, weight loss, malaise/fatigue and diaphoresis.  HENT: Positive for hearing loss. Negative for congestion, ear discharge, ear pain, nosebleeds, sore throat and tinnitus.        Roof of mouth soreness.   Eyes: Negative for blurred vision, double vision, photophobia, pain, discharge and redness.  Respiratory: Negative for cough, hemoptysis, sputum production, shortness of breath, wheezing and stridor.   Cardiovascular: Negative for chest pain, palpitations, orthopnea, claudication, leg swelling and PND.  Gastrointestinal: Positive for heartburn, nausea and constipation. Negative for vomiting, abdominal pain, diarrhea, blood in stool and melena.  Genitourinary: Negative for dysuria, urgency, frequency, hematuria and flank pain.  Musculoskeletal: Positive for joint pain and falls. Negative for myalgias, back pain and neck pain.       Sustained right pelvis fx from a fall at home. Pain is 3-4/10 with weight bearing.   Skin: Negative for itching and rash.       Right elbow bruise  Neurological: Negative for dizziness, tingling, tremors, sensory change, speech change, focal weakness, seizures, loss of consciousness, weakness and headaches.  Endo/Heme/Allergies: Negative for environmental allergies and polydipsia. Does not bruise/bleed easily.  Psychiatric/Behavioral: Negative for depression, suicidal ideas, hallucinations, memory loss and substance abuse. The patient is not nervous/anxious and does  not have insomnia.        Mildly confusion of time and place.       Past Medical History  Diagnosis Date  . Hyperlipidemia   . Hypertension   . Osteoporosis   . Macular degeneration   . Paroxysmal atrial fibrillation   . Arteriosclerotic cardiovascular disease   . Palpitations   . Constipation   . Memory deficit   . Ulcer of hard palate   . Pain in right foot   . Thyroid nodule     Status  post surgery  . Fracture of multiple pubic rami 10/15/14    Right inferior and superior pubic rami  . Degenerative joint disease of right hip 10/15/14   Past Surgical History  Procedure Laterality Date  . Appendectomy    . Abdominal hysterectomy    . Foot surgery      left  . Knee surgery     Social History:   reports that she quit smoking about 43 years ago. Her smoking use included Cigarettes. She smoked 0.50 packs per day. She has never used smokeless tobacco. She reports that she drinks alcohol. She reports that she does not use illicit drugs.  Family History  Problem Relation Age of Onset  . Heart disease Father   . Cancer Father     prostate  . Hyperlipidemia Sister   . Cancer Sister     breast  . Heart disease Brother   . Hyperlipidemia Brother   . Cancer Brother     prostate  . Cancer Maternal Grandmother     colon  . Heart disease Paternal Grandmother     Medications: Patient's Medications  New Prescriptions   No medications on file  Previous Medications   ASPIRIN 81 MG TABLET    Take 81 mg by mouth daily.   BIOTIN 10 MG CAPS    Take 1 tablet by mouth daily.   CELECOXIB (CELEBREX) 200 MG CAPSULE    Take 200 mg by mouth daily.   DOCUSATE SODIUM (COLACE) 100 MG CAPSULE    Take 1 capsule (100 mg total) by mouth daily.   DOXYLAMINE SUCCINATE, SLEEP, (SLEEP AID PO)    Take 1 tablet by mouth at bedtime as needed (for sleep).   FISH OIL-KRILL OIL (KRILL OIL PLUS) CAPS    Take 1 capsule by mouth daily.   HYDROCODONE-ACETAMINOPHEN (NORCO/VICODIN) 5-325 MG PER TABLET    Take 1-2 tablets by mouth every 6 (six) hours as needed.   IBUPROFEN (ADVIL,MOTRIN) 200 MG TABLET    Take 200 mg by mouth every 6 (six) hours as needed for moderate pain.   MULTIPLE VITAMIN (MULTIVITAMIN WITH MINERALS) TABS    Take 1 tablet by mouth daily.   MULTIPLE VITAMIN (VITAMIN E/FOLIC ACID/B-6/B-12) CAPS    Take 1 capsule by mouth daily.   MULTIPLE VITAMINS-MINERALS (OCUVITE PRESERVISION PO)     Take 2 tablets by mouth daily.    SIMVASTATIN (ZOCOR) 40 MG TABLET    Take 40 mg by mouth daily.  Modified Medications   No medications on file  Discontinued Medications   No medications on file     Physical Exam: Physical Exam  Constitutional: She appears well-developed and well-nourished. No distress.  HENT:  Head: Normocephalic and atraumatic.  Right Ear: External ear normal.  Left Ear: External ear normal.  Nose: Nose normal.  Mouth/Throat: Oropharynx is clear and moist. No oropharyngeal exudate.  Redness and inflamed roof of mouth.   Eyes: Conjunctivae and EOM are  normal. Pupils are equal, round, and reactive to light. Right eye exhibits no discharge. Left eye exhibits no discharge. No scleral icterus.  Neck: Normal range of motion. Neck supple. No JVD present. No tracheal deviation present. No thyromegaly present.  Cardiovascular: Normal rate, regular rhythm and intact distal pulses.  Exam reveals no gallop and no friction rub.   No murmur heard. Pulmonary/Chest: Effort normal and breath sounds normal. No stridor. No respiratory distress. She has no wheezes. She has no rales. She exhibits no tenderness.  Abdominal: Soft. Bowel sounds are normal. She exhibits no distension and no mass. There is no tenderness. There is no rebound and no guarding.  Musculoskeletal: She exhibits tenderness. She exhibits no edema.  Right hip ROM with pain and weight bearing. Also c/o right sided rib cage pain when palpated but not with deep breath or truncal movement.   Lymphadenopathy:    She has no cervical adenopathy.  Neurological: She is alert. She displays normal reflexes. No cranial nerve deficit. She exhibits normal muscle tone. Coordination normal.  Skin: Skin is warm and dry. No rash noted. She is not diaphoretic. No erythema. No pallor.  Right elbow abrasion  Psychiatric: She has a normal mood and affect. Her behavior is normal. Judgment and thought content normal.  Thought it was morning  around 4pm upon my visit. Also confused her bed and bathroom.     Filed Vitals:   10/27/14 1329  BP: 154/68  Pulse: 78  Temp: 96.8 F (36 C)  TempSrc: Tympanic  Resp: 16      Labs reviewed: Basic Metabolic Panel:  Recent Labs  16/08/9610/25/15 1208 10/21/14  NA 135* 133*  K 4.4 4.4  CL 99  --   CO2 23  --   GLUCOSE 114*  --   BUN 10 16  CREATININE 0.70 0.6  CALCIUM 9.5  --   TSH  --  2.12   Liver Function Tests:  Recent Labs  10/21/14  AST 27  ALT 24  ALKPHOS 63   No results for input(s): LIPASE, AMYLASE in the last 8760 hours. No results for input(s): AMMONIA in the last 8760 hours. CBC:  Recent Labs  10/15/14 1208 10/21/14  WBC 12.4* 6.1  NEUTROABS 11.2*  --   HGB 11.7* 11.2*  HCT 34.1* 32*  MCV 88.6  --   PLT 248 259   Lipid Panel:  Recent Labs  10/21/14  CHOL 138  HDL 62  LDLCALC 62  TRIG 69    Past Procedures:  10/15/14 X-ray   R rib: IMPRESSION:1. No acute rib fracture is demonstrated. 2. There is no acute cardiopulmonary abnormality.  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 Mildly displaced and comminuted fractures of the right superior and inferior pubic rami are noted. This is the initial encounter.  Assessment/Plan Stomatitis Will try Magic mouth wash 5ml s/s ac and hs x 2 weeks.   Essential hypertension Mild elevated Sbp140s, she is asymptomatic, no antihypertensive agent, takes ASA and and Atorvastatin for risk reduction.   Constipation Takes Colace daily. DulcoLax suppository 10mg  qd prn. Continue to observe.    Pelvis fracture, right 10/15/14 X-ray   R hip: IMPRESSION:Mild degenerative joint disease of the right hip is noted. Mildly displaced and comminuted fractures of the right superior and inferior pubic rami are noted. This is the initial encounter.  Shot pain into her right foot. Takes Mobic 7.5mg  daily and prn Ibuprofen 200mg  q6h prn and  Hydrocodone/Acetaminophen  5/325 q6h prn(jused most 2x/day). Continue Omeprazole 20mg  for GI protection.        Family/ Staff Communication: observe the patient  Goals of Care: [place]  Labs/tests ordered: none

## 2014-10-27 NOTE — Assessment & Plan Note (Signed)
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 Mildly displaced and comminuted fractures of the right superior and inferior pubic rami are noted. This is the initial encounter.  Shot pain into her right foot. Takes Mobic 7.5mg  daily and prn Ibuprofen 200mg  q6h prn and Hydrocodone/Acetaminophen 5/325 q6h prn(jused most 2x/day). Continue Omeprazole 20mg  for GI protection.

## 2014-10-27 NOTE — Assessment & Plan Note (Signed)
Mild elevated Sbp140s, she is asymptomatic, no antihypertensive agent, takes ASA and and Atorvastatin for risk reduction.

## 2014-10-27 NOTE — Assessment & Plan Note (Signed)
Will try Magic mouth wash 5ml s/s ac and hs x 2 weeks.

## 2014-10-27 NOTE — Assessment & Plan Note (Signed)
Takes Colace daily. DulcoLax suppository 10mg  qd prn. Continue to observe.

## 2014-11-24 ENCOUNTER — Non-Acute Institutional Stay (SKILLED_NURSING_FACILITY): Payer: Medicare Other | Admitting: Nurse Practitioner

## 2014-11-24 ENCOUNTER — Encounter: Payer: Self-pay | Admitting: Nurse Practitioner

## 2014-11-24 DIAGNOSIS — S329XXS Fracture of unspecified parts of lumbosacral spine and pelvis, sequela: Secondary | ICD-10-CM

## 2014-11-24 DIAGNOSIS — I10 Essential (primary) hypertension: Secondary | ICD-10-CM

## 2014-11-24 DIAGNOSIS — K5900 Constipation, unspecified: Secondary | ICD-10-CM

## 2014-11-24 NOTE — Assessment & Plan Note (Signed)
Takes Colace and MiraLax daily. DulcoLax suppository  qd prn. Continue to observe.

## 2014-11-24 NOTE — Assessment & Plan Note (Signed)
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 Mildly displaced and comminuted fractures of the right superior and inferior pubic rami are noted. This is the initial encounter.  Shot pain into her right foot. Takes Mobic 7.5mg  daily and prn Ibuprofen  q6h prn and Hydrocodone/Acetaminophen 5/325 q6h prn. Continue Omeprazole  for GI protection.   11/24/13 better.

## 2014-11-24 NOTE — Progress Notes (Signed)
Patient ID: Candice Willis, female   DOB: 1924/02/12, 81 y.o.   MRN: [id number]   Code Status: DNR  Allergies  Allergen Reactions  . Penicillins Other (See Comments)    Unknown     Chief Complaint  Patient presents with  . Medical Management of Chronic Issues    HPI: Patient is a 81 y.o. female seen in the SNF at [facility] today for evaluation of chronic medical conditions.    10/15/14 ED evaluation for R hip pain sustained from a mechanical fall at home. Xray R hip/ribs: IMPRESSION:1. No acute rib fracture is demonstrated. 2. 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 Mildly displaced and comminuted fractures of the right superior and inferior pubic rami are noted. This is the initial encounter.    Pt has a pubic rami fracture.  No surgery is required.  Pt can bear weight on this injury but she will need some assistance while she recovers.  SNF for [place]hab and [place]    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 Mildly displaced and comminuted fractures of the right superior and inferior pubic rami are noted. This is the initial encounter.  Shot pain into her right foot. Takes Mobic 7.5mg  daily and prn Ibuprofen  q6h prn and Hydrocodone/Acetaminophen 5/325 q6h prn. Continue Omeprazole  for GI protection.   11/24/13 better.         Essential hypertension    Mild elevated Sbp140s, she is asymptomatic, no antihypertensive agent, takes ASA and and Atorvastatin for risk reduction.      Constipation - Primary    Takes Colace and MiraLax daily. DulcoLax suppository  qd prn. Continue to observe.          Review of Systems:  Review of Systems  Constitutional: Negative for fever, chills, weight loss, malaise/fatigue and  diaphoresis.  HENT: Positive for hearing loss. Negative for congestion, ear discharge, ear pain, nosebleeds, sore throat and tinnitus.        Roof of mouth soreness-resolved  Eyes: Negative for blurred vision, double vision, photophobia, pain, discharge and redness.  Respiratory: Negative for cough, hemoptysis, sputum production, shortness of breath, wheezing and stridor.   Cardiovascular: Negative for chest pain, palpitations, orthopnea, claudication, leg swelling and PND.  Gastrointestinal: Positive for heartburn and constipation. Negative for nausea, vomiting, abdominal pain, diarrhea, blood in stool and melena.  Genitourinary: Negative for dysuria, urgency, frequency, hematuria and flank pain.  Musculoskeletal: Positive for joint pain and falls. Negative for myalgias, back pain and neck pain.       Sustained right pelvis fx from a fall at home. Pain is 3-4/10 with weight bearing-better controlled.   Skin: Negative for itching and rash.       Right elbow bruise-healed.   Neurological: Negative for dizziness, tingling, tremors, sensory change, speech change, focal weakness, seizures, loss of consciousness, weakness and headaches.  Endo/Heme/Allergies: Negative for environmental allergies and polydipsia. Does not bruise/bleed easily.  Psychiatric/Behavioral: Negative for depression, suicidal ideas, hallucinations, memory loss and substance abuse. The patient is not nervous/anxious and does not have insomnia.        Mildly confusion of time and place.       Past Medical History  Diagnosis Date  . Hyperlipidemia   . Hypertension   .  Osteoporosis   . Macular degeneration   . Paroxysmal atrial fibrillation   . Arteriosclerotic cardiovascular disease   . Palpitations   . Constipation   . Memory deficit   . Ulcer of hard palate   . Pain in right foot   . Thyroid nodule     Status post surgery  . Fracture of multiple pubic rami 10/15/14    Right inferior and superior pubic rami  .  Degenerative joint disease of right hip 10/15/14   Past Surgical History  Procedure Laterality Date  . Appendectomy    . Abdominal hysterectomy    . Foot surgery      left  . Knee surgery     Social History:   reports that she quit smoking about 43 years ago. Her smoking use included Cigarettes. She smoked 0.50 packs per day. She has never used smokeless tobacco. She reports that she drinks alcohol. She reports that she does not use illicit drugs.  Family History  Problem Relation Age of Onset  . Heart disease Father   . Cancer Father     prostate  . Hyperlipidemia Sister   . Cancer Sister     breast  . Heart disease Brother   . Hyperlipidemia Brother   . Cancer Brother     prostate  . Cancer Maternal Grandmother     colon  . Heart disease Paternal Grandmother     Medications: Patient's Medications  New Prescriptions   No medications on file  Previous Medications   ASPIRIN 81 MG TABLET    Take 81 mg by mouth daily.   BIOTIN 10 MG CAPS    Take 1 tablet by mouth daily.   CELECOXIB (CELEBREX) 200 MG CAPSULE    Take 200 mg by mouth daily.   DOCUSATE SODIUM (COLACE) 100 MG CAPSULE    Take 1 capsule (100 mg total) by mouth daily.   DOXYLAMINE SUCCINATE, SLEEP, (SLEEP AID PO)    Take 1 tablet by mouth at bedtime as needed (for sleep).   FISH OIL-KRILL OIL (KRILL OIL PLUS) CAPS    Take 1 capsule by mouth daily.   HYDROCODONE-ACETAMINOPHEN (NORCO/VICODIN) 5-325 MG PER TABLET    Take 1-2 tablets by mouth every 6 (six) hours as needed.   IBUPROFEN (ADVIL,MOTRIN) 200 MG TABLET    Take 200 mg by mouth every 6 (six) hours as needed for moderate pain.   MULTIPLE VITAMIN (MULTIVITAMIN WITH MINERALS) TABS    Take 1 tablet by mouth daily.   MULTIPLE VITAMIN (VITAMIN E/FOLIC ACID/B-6/B-12) CAPS    Take 1 capsule by mouth daily.   MULTIPLE VITAMINS-MINERALS (OCUVITE PRESERVISION PO)    Take 2 tablets by mouth daily.    SIMVASTATIN (ZOCOR) 40 MG TABLET    Take 40 mg by mouth daily.    Modified Medications   No medications on file  Discontinued Medications   No medications on file     Physical Exam: Physical Exam  Constitutional: She appears well-developed and well-nourished. No distress.  HENT:  Head: Normocephalic and atraumatic.  Right Ear: External ear normal.  Left Ear: External ear normal.  Nose: Nose normal.  Mouth/Throat: Oropharynx is clear and moist. No oropharyngeal exudate.  Eyes: Conjunctivae and EOM are normal. Pupils are equal, round, and reactive to light. Right eye exhibits no discharge. Left eye exhibits no discharge. No scleral icterus.  Neck: Normal range of motion. Neck supple. No JVD present. No tracheal deviation present. No thyromegaly present.  Cardiovascular: Normal rate, regular  rhythm and intact distal pulses.  Exam reveals no gallop and no friction rub.   No murmur heard. Pulmonary/Chest: Effort normal and breath sounds normal. No stridor. No respiratory distress. She has no wheezes. She has no rales. She exhibits no tenderness.  Abdominal: Soft. Bowel sounds are normal. She exhibits no distension and no mass. There is no tenderness. There is no rebound and no guarding.  Musculoskeletal: She exhibits tenderness. She exhibits no edema.  Right hip ROM with pain and weight bearing. Also c/o right sided rib cage pain when palpated but not with deep breath or truncal movement.  Better controlled pain  Lymphadenopathy:    She has no cervical adenopathy.  Neurological: She is alert. She displays normal reflexes. No cranial nerve deficit. She exhibits normal muscle tone. Coordination normal.  Skin: Skin is warm and dry. No rash noted. She is not diaphoretic. No erythema. No pallor.  Right elbow abrasion  Psychiatric: She has a normal mood and affect. Her behavior is normal. Judgment and thought content normal.    Filed Vitals:   11/24/14 1419  BP: 160/70  Pulse: 74  Temp: 97 F (36.1 C)  TempSrc: Tympanic  Resp: 20      Labs  reviewed: Basic Metabolic Panel:  Recent Labs  16/10/96 1208 10/21/14  NA 135* 133*  K 4.4 4.4  CL 99  --   CO2 23  --   GLUCOSE 114*  --   BUN 10 16  CREATININE 0.70 0.6  CALCIUM 9.5  --   TSH  --  2.12   Liver Function Tests:  Recent Labs  10/21/14  AST 27  ALT 24  ALKPHOS 63   No results for input(s): LIPASE, AMYLASE in the last 8760 hours. No results for input(s): AMMONIA in the last 8760 hours. CBC:  Recent Labs  10/15/14 1208 10/21/14  WBC 12.4* 6.1  NEUTROABS 11.2*  --   HGB 11.7* 11.2*  HCT 34.1* 32*  MCV 88.6  --   PLT 248 259   Lipid Panel:  Recent Labs  10/21/14  CHOL 138  HDL 62  LDLCALC 62  TRIG 69    Past Procedures:  10/15/14 X-ray   R rib: IMPRESSION:1. No acute rib fracture is demonstrated. 2. There is no acute cardiopulmonary abnormality.  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 Mildly displaced and comminuted fractures of the right superior and inferior pubic rami are noted. This is the initial encounter.  Assessment/Plan Constipation Takes Colace and MiraLax daily. DulcoLax suppository 10mg  qd prn. Continue to observe.     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 Mildly displaced and comminuted fractures of the right superior and inferior pubic rami are noted. This is the initial encounter.  Shot pain into her right foot. Takes Mobic 7.5mg  daily and prn Ibuprofen 200mg  q6h prn and Hydrocodone/Acetaminophen 5/325 q6h prn. Continue Omeprazole 20mg  for GI protection.   11/24/13 better.       Essential hypertension Mild elevated Sbp140s, she is asymptomatic, no antihypertensive agent, takes ASA and and Atorvastatin for risk reduction.      Family/ Staff Communication: observe the patient  Goals of Care: [place]  Labs/tests ordered: none

## 2014-11-24 NOTE — Assessment & Plan Note (Signed)
Mild elevated Sbp140s, she is asymptomatic, no antihypertensive agent, takes ASA and and Atorvastatin for risk reduction.  

## 2015-01-28 ENCOUNTER — Ambulatory Visit: Payer: Medicare Other | Admitting: Internal Medicine

## 2015-01-28 ENCOUNTER — Encounter: Payer: Medicare Other | Admitting: Internal Medicine

## 2015-03-04 ENCOUNTER — Telehealth (HOSPITAL_COMMUNITY): Payer: Self-pay | Admitting: *Deleted

## 2015-04-09 ENCOUNTER — Other Ambulatory Visit: Payer: Self-pay | Admitting: Family Medicine

## 2015-04-09 DIAGNOSIS — Z1231 Encounter for screening mammogram for malignant neoplasm of breast: Secondary | ICD-10-CM

## 2015-05-05 ENCOUNTER — Other Ambulatory Visit: Payer: Self-pay | Admitting: *Deleted

## 2015-05-05 DIAGNOSIS — M79651 Pain in right thigh: Secondary | ICD-10-CM

## 2015-05-12 ENCOUNTER — Ambulatory Visit
Admission: RE | Admit: 2015-05-12 | Discharge: 2015-05-12 | Disposition: A | Discharge status: Home / Self Care | Payer: Medicare Other | Source: Ambulatory Visit | Attending: Family Medicine | Admitting: Family Medicine

## 2015-05-12 DIAGNOSIS — Z1231 Encounter for screening mammogram for malignant neoplasm of breast: Secondary | ICD-10-CM

## 2015-06-11 ENCOUNTER — Encounter: Payer: Self-pay | Admitting: Vascular Surgery

## 2015-06-16 ENCOUNTER — Ambulatory Visit (HOSPITAL_COMMUNITY)
Admission: RE | Admit: 2015-06-16 | Discharge: 2015-06-16 | Disposition: A | Discharge status: Home / Self Care | Payer: Medicare Other | Source: Ambulatory Visit | Attending: Vascular Surgery | Admitting: Vascular Surgery

## 2015-06-16 ENCOUNTER — Encounter: Payer: Self-pay | Admitting: Vascular Surgery

## 2015-06-16 ENCOUNTER — Ambulatory Visit (INDEPENDENT_AMBULATORY_CARE_PROVIDER_SITE_OTHER): Payer: Medicare Other | Admitting: Vascular Surgery

## 2015-06-16 VITALS — BP 188/92 | HR 88 | Temp 97.6°F | Resp 16 | Ht 63.0 in | Wt 118.0 lb

## 2015-06-16 DIAGNOSIS — M79651 Pain in right thigh: Secondary | ICD-10-CM | POA: Diagnosis not present

## 2015-06-16 DIAGNOSIS — M79604 Pain in right leg: Secondary | ICD-10-CM | POA: Diagnosis not present

## 2015-06-16 DIAGNOSIS — M25561 Pain in right knee: Secondary | ICD-10-CM | POA: Insufficient documentation

## 2015-06-16 NOTE — Progress Notes (Signed)
Filed Vitals:   06/16/15 0947 06/16/15 0952  BP: 180/90 188/92  Pulse: 88   Temp: 97.6 F (36.4 C)   Resp: 16   Height:  (1.6 m)   Weight: 118 lb (53.524 kg)   SpO2: 100%

## 2015-06-16 NOTE — Progress Notes (Signed)
Subjective:     Patient ID: Candice Willis, female   DOB: 08-Nov-1924, 81 y.o.   MRN: [id number]  HPI  This 81 year old female was referred by Dr. Knox Royalty for possible varicose veins right leg. Patient states she has noticed a "lump" in her mid thigh area for the past few years. She now has some discomfort radiating lateral to this. She's had no history of DVT, thrombophlebitis, stasis ulcers, bleeding, or chronic edema. She does not roll last to compression stockings.   Past Medical History  Diagnosis Date  . Hyperlipidemia   . Hypertension   . Osteoporosis   . Macular degeneration   . Paroxysmal atrial fibrillation   . Arteriosclerotic cardiovascular disease   . Palpitations   . Constipation   . Memory deficit   . Ulcer of hard palate   . Pain in right foot   . Thyroid nodule     Status post surgery  . Fracture of multiple pubic rami 10/15/14    Right inferior and superior pubic rami  . Degenerative joint disease of right hip 10/15/14    History  Substance Use Topics  . Smoking status: Former Smoker -- 0.50 packs/day    Types: Cigarettes    Quit date: 10/05/1971  . Smokeless tobacco: Never Used  . Alcohol Use: Yes     Comment: 1-4 per week    Family History  Problem Relation Age of Onset  . Heart disease Father   . Cancer Father     prostate  . Hyperlipidemia Sister   . Cancer Sister     breast  . Heart disease Brother   . Hyperlipidemia Brother   . Cancer Brother     prostate  . Cancer Maternal Grandmother     colon  . Heart disease Paternal Grandmother     Allergies  Allergen Reactions  . Penicillins Other (See Comments)    Unknown      Current outpatient prescriptions:  .  aspirin 81 MG tablet, Take 81 mg by mouth daily., Disp: , Rfl:  .  Biotin 10 MG CAPS, Take 1 tablet by mouth daily., Disp: , Rfl:  .  celecoxib (CELEBREX) 200 MG capsule, Take 200 mg by mouth daily., Disp: , Rfl:  .  docusate sodium (COLACE) 100 MG capsule, Take 1 capsule  (100 mg total) by mouth daily., Disp: 60 capsule, Rfl: 0 .  Doxylamine Succinate, Sleep, (SLEEP AID PO), Take 1 tablet by mouth at bedtime as needed (for sleep)., Disp: , Rfl:  .  Fish Oil-Krill Oil (KRILL OIL PLUS) CAPS, Take 1 capsule by mouth daily., Disp: , Rfl:  .  HYDROcodone-acetaminophen (NORCO/VICODIN) 5-325 MG per tablet, Take 1-2 tablets by mouth every 6 (six) hours as needed., Disp: 30 tablet, Rfl: 0 .  ibuprofen (ADVIL,MOTRIN) 200 MG tablet, Take 200 mg by mouth every 6 (six) hours as needed for moderate pain., Disp: , Rfl:  .  Multiple Vitamin (MULTIVITAMIN WITH MINERALS) TABS, Take 1 tablet by mouth daily., Disp: , Rfl:  .  Multiple Vitamin (VITAMIN E/FOLIC ACID/B-6/B-12) CAPS, Take 1 capsule by mouth daily., Disp: , Rfl:  .  Multiple Vitamins-Minerals (OCUVITE PRESERVISION PO), Take 2 tablets by mouth daily. , Disp: , Rfl:  .  simvastatin (ZOCOR) 40 MG tablet, Take 40 mg by mouth daily., Disp: , Rfl:   Filed Vitals:   06/16/15 0947 06/16/15 0952  BP: 180/90 188/92  Pulse: 88   Temp: 97.6 F (36.4 C)   Resp: 16  Height: 5\' 3"  (1.6 m)   Weight: 118 lb (53.524 kg)   SpO2: 100%     Body mass index is 20.91 kg/(m^2).          Review of Systems Denies chest pain, dyspnea on exertion, PND, orthopnea, hemoptysis, claudication.     Objective:   Physical Exam BP 188/92 mmHg  Pulse 88  Temp(Src) 97.6 F (36.4 C)  Resp 16  Ht 5\' 3"  (1.6 m)  Wt 118 lb (53.524 kg)  BMI 20.91 kg/m2  SpO2 100%  Gen.-alert and oriented x3 in no apparent distress -elderly - spry HEENT normal for age Lungs no rhonchi or wheezing Cardiovascular regular rhythm no murmurs carotid pulses 3+ palpable no bruits audible Abdomen soft nontender no palpable masses Musculoskeletal free of  major deformities Skin clear -no rashes Neurologic normal Lower extremities 3+ femoral and dorsalis pedis pulses palpable bilaterally with no edema  examination of right leg has small palpable "lump" about  1-1/2 cm in diameter in mid anterior thigh which appears to be lipoma. A few spider veins lateral to this but no significant varicosities are noted.   Today I ordered a venous duplex exam of the right leg which I reviewed and interpreted. There is no DVT. There is some reflux at the saphenofemoral junction which is not significant. There is a 2.2 x 0.77 cm mass with no color flow noted where the patient's lump is located.       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

## 2015-09-10 ENCOUNTER — Emergency Department (HOSPITAL_COMMUNITY): Payer: Medicare Other

## 2015-09-10 ENCOUNTER — Inpatient Hospital Stay (HOSPITAL_COMMUNITY)
Admission: EM | Admit: 2015-09-10 | Discharge: 2015-09-14 | DRG: 481 | Disposition: A | Discharge status: Skilled Nursing Facility | Payer: Medicare Other | Attending: Internal Medicine | Admitting: Internal Medicine

## 2015-09-10 ENCOUNTER — Encounter (HOSPITAL_COMMUNITY): Payer: Self-pay | Admitting: Emergency Medicine

## 2015-09-10 DIAGNOSIS — Z791 Long term (current) use of non-steroidal anti-inflammatories (NSAID): Secondary | ICD-10-CM | POA: Diagnosis not present

## 2015-09-10 DIAGNOSIS — E785 Hyperlipidemia, unspecified: Secondary | ICD-10-CM | POA: Diagnosis present

## 2015-09-10 DIAGNOSIS — F0150 Vascular dementia without behavioral disturbance: Secondary | ICD-10-CM | POA: Diagnosis present

## 2015-09-10 DIAGNOSIS — I10 Essential (primary) hypertension: Secondary | ICD-10-CM | POA: Diagnosis present

## 2015-09-10 DIAGNOSIS — M810 Age-related osteoporosis without current pathological fracture: Secondary | ICD-10-CM | POA: Diagnosis present

## 2015-09-10 DIAGNOSIS — Y9389 Activity, other specified: Secondary | ICD-10-CM

## 2015-09-10 DIAGNOSIS — I480 Paroxysmal atrial fibrillation: Secondary | ICD-10-CM | POA: Diagnosis present

## 2015-09-10 DIAGNOSIS — W1839XA Other fall on same level, initial encounter: Secondary | ICD-10-CM | POA: Diagnosis present

## 2015-09-10 DIAGNOSIS — S72009A Fracture of unspecified part of neck of unspecified femur, initial encounter for closed fracture: Secondary | ICD-10-CM

## 2015-09-10 DIAGNOSIS — I2510 Atherosclerotic heart disease of native coronary artery without angina pectoris: Secondary | ICD-10-CM | POA: Diagnosis present

## 2015-09-10 DIAGNOSIS — E041 Nontoxic single thyroid nodule: Secondary | ICD-10-CM | POA: Diagnosis present

## 2015-09-10 DIAGNOSIS — R42 Dizziness and giddiness: Secondary | ICD-10-CM | POA: Diagnosis present

## 2015-09-10 DIAGNOSIS — S72001A Fracture of unspecified part of neck of right femur, initial encounter for closed fracture: Secondary | ICD-10-CM | POA: Diagnosis present

## 2015-09-10 DIAGNOSIS — H3530 Unspecified macular degeneration: Secondary | ICD-10-CM | POA: Diagnosis present

## 2015-09-10 DIAGNOSIS — E871 Hypo-osmolality and hyponatremia: Secondary | ICD-10-CM | POA: Diagnosis not present

## 2015-09-10 DIAGNOSIS — D62 Acute posthemorrhagic anemia: Secondary | ICD-10-CM | POA: Diagnosis not present

## 2015-09-10 DIAGNOSIS — T148XXA Other injury of unspecified body region, initial encounter: Secondary | ICD-10-CM

## 2015-09-10 DIAGNOSIS — R296 Repeated falls: Secondary | ICD-10-CM | POA: Diagnosis present

## 2015-09-10 DIAGNOSIS — H5509 Other forms of nystagmus: Secondary | ICD-10-CM | POA: Diagnosis present

## 2015-09-10 DIAGNOSIS — Z87891 Personal history of nicotine dependence: Secondary | ICD-10-CM | POA: Diagnosis not present

## 2015-09-10 DIAGNOSIS — Z880 Allergy status to penicillin: Secondary | ICD-10-CM | POA: Diagnosis not present

## 2015-09-10 DIAGNOSIS — Z9181 History of falling: Secondary | ICD-10-CM | POA: Diagnosis not present

## 2015-09-10 DIAGNOSIS — R197 Diarrhea, unspecified: Secondary | ICD-10-CM | POA: Diagnosis present

## 2015-09-10 DIAGNOSIS — M25551 Pain in right hip: Secondary | ICD-10-CM | POA: Diagnosis present

## 2015-09-10 DIAGNOSIS — R413 Other amnesia: Secondary | ICD-10-CM | POA: Diagnosis present

## 2015-09-10 DIAGNOSIS — S72141A Displaced intertrochanteric fracture of right femur, initial encounter for closed fracture: Principal | ICD-10-CM | POA: Diagnosis present

## 2015-09-10 DIAGNOSIS — Z7982 Long term (current) use of aspirin: Secondary | ICD-10-CM | POA: Diagnosis not present

## 2015-09-10 DIAGNOSIS — W19XXXA Unspecified fall, initial encounter: Secondary | ICD-10-CM

## 2015-09-10 LAB — CBC WITH DIFFERENTIAL/PLATELET
Basophils Absolute: 0 10*3/uL (ref 0.0–0.1)
Basophils Relative: 0 %
EOS ABS: 0 10*3/uL (ref 0.0–0.7)
EOS PCT: 1 %
HCT: 37 % (ref 36.0–46.0)
HEMOGLOBIN: 13 g/dL (ref 12.0–15.0)
LYMPHS ABS: 0.9 10*3/uL (ref 0.7–4.0)
LYMPHS PCT: 20 %
MCH: 31.2 pg (ref 26.0–34.0)
MCHC: 35.1 g/dL (ref 30.0–36.0)
MCV: 88.7 fL (ref 78.0–100.0)
MONOS PCT: 9 %
Monocytes Absolute: 0.4 10*3/uL (ref 0.1–1.0)
Neutro Abs: 3.2 10*3/uL (ref 1.7–7.7)
Neutrophils Relative %: 70 %
PLATELETS: 223 10*3/uL (ref 150–400)
RBC: 4.17 MIL/uL (ref 3.87–5.11)
RDW: 13.3 % (ref 11.5–15.5)
WBC: 4.5 10*3/uL (ref 4.0–10.5)

## 2015-09-10 LAB — BASIC METABOLIC PANEL
Anion gap: 9 (ref 5–15)
BUN: 13 mg/dL (ref 6–20)
CALCIUM: 9.1 mg/dL (ref 8.9–10.3)
CO2: 23 mmol/L (ref 22–32)
CREATININE: 0.7 mg/dL (ref 0.44–1.00)
Chloride: 102 mmol/L (ref 101–111)
GFR calc Af Amer: >60 mL/min (ref >60)
Glucose, Bld: 151 mg/dL — ABNORMAL HIGH (ref 65–99)
Potassium: 3.5 mmol/L (ref 3.5–5.1)
SODIUM: 134 mmol/L — AB (ref 135–145)

## 2015-09-10 MED ORDER — MORPHINE SULFATE (PF) 2 MG/ML IV SOLN
2.0000 mg | INTRAVENOUS | Status: DC | PRN
  Administered 2015-09-10: 2 mg via INTRAVENOUS
  Filled 2015-09-10: qty 1

## 2015-09-10 MED ORDER — MECLIZINE HCL 25 MG PO TABS
25.0000 mg | ORAL_TABLET | Freq: Once | ORAL | Status: AC
  Administered 2015-09-10: 25 mg via ORAL
  Filled 2015-09-10: qty 1

## 2015-09-10 MED ORDER — ACETAMINOPHEN 325 MG PO TABS
650.0000 mg | ORAL_TABLET | Freq: Four times a day (QID) | ORAL | Status: DC | PRN
  Administered 2015-09-12 – 2015-09-13 (×2): 650 mg via ORAL
  Filled 2015-09-10 (×2): qty 2

## 2015-09-10 MED ORDER — CYCLOBENZAPRINE HCL 10 MG PO TABS
10.0000 mg | ORAL_TABLET | Freq: Once | ORAL | Status: AC
  Administered 2015-09-10: 10 mg via ORAL
  Filled 2015-09-10: qty 1

## 2015-09-10 MED ORDER — ONDANSETRON HCL 4 MG/2ML IJ SOLN
4.0000 mg | Freq: Once | INTRAMUSCULAR | Status: AC | PRN
  Administered 2015-09-10: 4 mg via INTRAVENOUS
  Filled 2015-09-10: qty 2

## 2015-09-10 MED ORDER — ASPIRIN 81 MG PO CHEW
81.0000 mg | CHEWABLE_TABLET | Freq: Every day | ORAL | Status: DC
Start: 2015-09-11 — End: 2015-09-11

## 2015-09-10 MED ORDER — ADULT MULTIVITAMIN W/MINERALS CH
1.0000 | ORAL_TABLET | Freq: Every day | ORAL | Status: DC
  Administered 2015-09-11 – 2015-09-14 (×4): 1 via ORAL
  Filled 2015-09-10 (×4): qty 1

## 2015-09-10 MED ORDER — MECLIZINE HCL 12.5 MG PO TABS
12.5000 mg | ORAL_TABLET | Freq: Three times a day (TID) | ORAL | Status: DC | PRN
  Filled 2015-09-10: qty 1

## 2015-09-10 MED ORDER — CYCLOBENZAPRINE HCL 10 MG PO TABS: 5.0000 mg | ORAL_TABLET | Freq: Three times a day (TID) | ORAL | Status: DC | PRN

## 2015-09-10 MED ORDER — DOXYLAMINE SUCCINATE (SLEEP) 25 MG PO TABS
25.0000 mg | ORAL_TABLET | Freq: Every evening | ORAL | Status: DC | PRN
  Filled 2015-09-10: qty 1

## 2015-09-10 MED ORDER — SODIUM CHLORIDE 0.9 % IV SOLN
INTRAVENOUS | Status: DC
  Administered 2015-09-10 – 2015-09-11 (×2): via INTRAVENOUS

## 2015-09-10 MED ORDER — ONDANSETRON HCL 4 MG/2ML IJ SOLN: 4.0000 mg | Freq: Three times a day (TID) | INTRAMUSCULAR | Status: DC | PRN

## 2015-09-10 MED ORDER — BIOTIN 10 MG PO CAPS: 1.0000 | ORAL_CAPSULE | Freq: Every day | ORAL | Status: DC

## 2015-09-10 MED ORDER — OMEGA-3-ACID ETHYL ESTERS 1 G PO CAPS
1.0000 g | ORAL_CAPSULE | Freq: Every day | ORAL | Status: DC
  Administered 2015-09-11 – 2015-09-14 (×4): 1 g via ORAL
  Filled 2015-09-10 (×4): qty 1

## 2015-09-10 MED ORDER — SIMVASTATIN 40 MG PO TABS
40.0000 mg | ORAL_TABLET | Freq: Every day | ORAL | Status: DC
  Administered 2015-09-11 – 2015-09-14 (×4): 40 mg via ORAL
  Filled 2015-09-10 (×4): qty 1

## 2015-09-10 MED ORDER — SODIUM CHLORIDE 0.9 % IJ SOLN
3.0000 mL | Freq: Two times a day (BID) | INTRAMUSCULAR | Status: DC
  Administered 2015-09-10: 3 mL via INTRAVENOUS
  Administered 2015-09-12: 10 mL via INTRAVENOUS
  Administered 2015-09-13 – 2015-09-14 (×3): 3 mL via INTRAVENOUS

## 2015-09-10 MED ORDER — HYDROCODONE-ACETAMINOPHEN 5-325 MG PO TABS
1.0000 | ORAL_TABLET | Freq: Four times a day (QID) | ORAL | Status: DC | PRN
  Administered 2015-09-12: 1 via ORAL
  Administered 2015-09-13 (×2): 2 via ORAL
  Administered 2015-09-14 (×3): 1 via ORAL
  Filled 2015-09-10: qty 1
  Filled 2015-09-10: qty 2
  Filled 2015-09-10: qty 1
  Filled 2015-09-10: qty 2
  Filled 2015-09-10 (×2): qty 1

## 2015-09-10 MED ORDER — HYDRALAZINE HCL 20 MG/ML IJ SOLN: 5.0000 mg | INTRAMUSCULAR | Status: DC | PRN

## 2015-09-10 MED ORDER — KRILL OIL PLUS PO CAPS: 1.0000 | ORAL_CAPSULE | Freq: Every day | ORAL | Status: DC

## 2015-09-10 NOTE — ED Notes (Signed)
Lost balance while hanging picture on wall, landed on right hip. Felt a "pop," any movement/palpation to hip causes a lot of pain. EMS states obvious deformity. Given 50 mcg of fentanyl IV in route. Pt only complaining of nausea at this time. Refused zofran from EMS.

## 2015-09-10 NOTE — ED Provider Notes (Signed)
CSN: [id number]     Arrival date & time 09/10/15  1743 History   First MD Initiated Contact with Patient 09/10/15 1747     Chief Complaint  Patient presents with  . Fall  . Hip Injury     HPI   Candice Willis is a 81 y.o. female with a PMH of HLD, HTN, pubic rami fracture who presents to the ED with fall and right hip pain. She states she was hanging a picture on the wall, turned around, felt dizzy, and fell, landing on her right side. She reports right sided hip pain since that time. She states she has felt more tired than usual over the past 3 days, and that she has also experienced intermittent dizziness. She states she had similar symptoms of dizziness in the past, but has not experienced dizziness over the last 5 years until several days ago. She reports nausea. She denies hitting her head or LOC. She denies headache, lightheadedness, vision changes, chest pain, shortness of breath, abdominal pain, N/V/D/C, numbness, weakness, paresthesia.    Past Medical History  Diagnosis Date  . Hyperlipidemia   . Hypertension   . Osteoporosis   . Macular degeneration   . Paroxysmal atrial fibrillation (HCC)   . Arteriosclerotic cardiovascular disease   . Palpitations   . Constipation   . Memory deficit   . Ulcer of hard palate   . Pain in right foot   . Thyroid nodule     Status post surgery  . Fracture of multiple pubic rami (HCC) 10/15/14    Right inferior and superior pubic rami  . Degenerative joint disease of right hip 10/15/14   Past Surgical History  Procedure Laterality Date  . Appendectomy    . Abdominal hysterectomy    . Foot surgery      left  . Knee surgery     Family History  Problem Relation Age of Onset  . Heart disease Father   . Cancer Father     prostate  . Hyperlipidemia Sister   . Cancer Sister     breast  . Heart disease Brother   . Hyperlipidemia Brother   . Cancer Brother     prostate  . Cancer Maternal Grandmother     colon  . Heart disease  Paternal Grandmother    Social History  Substance Use Topics  . Smoking status: Former Smoker -- 0.50 packs/day    Types: Cigarettes    Quit date: 10/05/1971  . Smokeless tobacco: Never Used  . Alcohol Use: Yes     Comment: 1-4 per week   OB History    No data available      Review of Systems  Constitutional: Negative for fever and chills.  Eyes: Negative for visual disturbance.  Respiratory: Negative for shortness of breath.   Cardiovascular: Negative for chest pain.  Gastrointestinal: Negative for nausea, vomiting, abdominal pain, diarrhea and constipation.  Musculoskeletal: Positive for arthralgias. Negative for back pain and neck pain.  Neurological: Positive for dizziness. Negative for syncope, weakness, light-headedness, numbness and headaches.  All other systems reviewed and are negative.     Allergies  Penicillins  Home Medications   Prior to Admission medications   Medication Sig Start Date End Date Taking? Authorizing Provider  aspirin 81 MG tablet Take 81 mg by mouth daily.    Historical Provider, MD  Biotin 10 MG CAPS Take 1 tablet by mouth daily.    Historical Provider, MD  celecoxib (CELEBREX) 200 MG capsule  Take 200 mg by mouth daily.    Historical Provider, MD  docusate sodium (COLACE) 100 MG capsule Take 1 capsule (100 mg total) by mouth daily. 10/15/14   Linwood Dibbles, MD  Doxylamine Succinate, Sleep, (SLEEP AID PO) Take 1 tablet by mouth at bedtime as needed (for sleep).    Historical Provider, MD  Fish Oil-Krill Oil (KRILL OIL PLUS) CAPS Take 1 capsule by mouth daily.    Historical Provider, MD  HYDROcodone-acetaminophen (NORCO/VICODIN) 5-325 MG per tablet Take 1-2 tablets by mouth every 6 (six) hours as needed. 10/15/14   Linwood Dibbles, MD  ibuprofen (ADVIL,MOTRIN) 200 MG tablet Take 200 mg by mouth every 6 (six) hours as needed for moderate pain.    Historical Provider, MD  Multiple Vitamin (MULTIVITAMIN WITH MINERALS) TABS Take 1 tablet by mouth daily.     Historical Provider, MD  Multiple Vitamin (VITAMIN E/FOLIC ACID/B-6/B-12) CAPS Take 1 capsule by mouth daily.    Historical Provider, MD  Multiple Vitamins-Minerals (OCUVITE PRESERVISION PO) Take 2 tablets by mouth daily.     Historical Provider, MD  simvastatin (ZOCOR) 40 MG tablet Take 40 mg by mouth daily.    Historical Provider, MD    BP 178/69 mmHg  Pulse 67  Temp(Src) 98.4 F (36.9 C) (Oral)  Resp 20  SpO2 93% Physical Exam  Constitutional: She is oriented to person, place, and time. She appears well-developed and well-nourished. No distress.  HENT:  Head: Normocephalic and atraumatic.  Right Ear: External ear normal.  Left Ear: External ear normal.  Nose: Nose normal.  Mouth/Throat: Uvula is midline, oropharynx is clear and moist and mucous membranes are normal.  Eyes: Conjunctivae, EOM and lids are normal. Pupils are equal, round, and reactive to light. Right eye exhibits no discharge. Left eye exhibits no discharge. No scleral icterus.  Horizontal nystagmus bilaterally.  Neck: Normal range of motion. Neck supple. No spinous process tenderness and no muscular tenderness present.  Cardiovascular: Normal rate, regular rhythm, normal heart sounds, intact distal pulses and normal pulses.   Pulmonary/Chest: Effort normal and breath sounds normal. No respiratory distress.  Abdominal: Soft. Normal appearance and bowel sounds are normal. She exhibits no distension and no mass. There is no tenderness. There is no rigidity, no rebound and no guarding.  Musculoskeletal: She exhibits tenderness. She exhibits no edema.  Mild TTP over right lateral hip. Patient unable to move RLE at hip due to pain. RLE shortened. Distal pulses intact. Strength and sensation intact.  Neurological: She is alert and oriented to person, place, and time. She has normal strength. No cranial nerve deficit or sensory deficit.  Skin: Skin is warm, dry and intact. No rash noted. She is not diaphoretic. No erythema. No  pallor.  Psychiatric: She has a normal mood and affect. Her speech is normal and behavior is normal.  Nursing note and vitals reviewed.   ED Course  Procedures (including critical care time)  Labs Review Labs Reviewed  BASIC METABOLIC PANEL - Abnormal; Notable for the following:    Sodium 134 (*)    Glucose, Bld 151 (*)    All other components within normal limits  CBC WITH DIFFERENTIAL/PLATELET    Imaging Review Dg Chest 1 View  09/10/2015  CLINICAL DATA:  Right hip pain after falling today. History of pelvic fractures. Initial encounter. EXAM: CHEST 1 VIEW COMPARISON:  10/15/2014 radiographs. FINDINGS: 1814 hours. The heart size and mediastinal contours are stable. There is aortic atherosclerosis and mild chronic vascular congestion. The lungs are  otherwise clear. No pleural effusion, pneumothorax or acute osseous abnormality identified. IMPRESSION: No acute posttraumatic findings or active cardiopulmonary process identified. Electronically Signed   By: Carey BullocksWilliam  Veazey M.D.   On: 09/10/2015 18:26   Ct Head Wo Contrast  09/10/2015  CLINICAL DATA:  Lost balance while hanging a picture on the wall, fell landing on RIGHT hip, dizziness, suffered a RIGHT hip fracture, nausea, hypertension, atrial fibrillation, former smoker EXAM: CT HEAD WITHOUT CONTRAST TECHNIQUE: Contiguous axial images were obtained from the base of the skull through the vertex without intravenous contrast. COMPARISON:  10/07/2012 FINDINGS: Generalized atrophy. Normal ventricular morphology. No midline shift or mass effect. Small vessel chronic ischemic changes of deep cerebral white matter. No intracranial hemorrhage, mass lesion, or acute infarction. Visualized paranasal sinuses and mastoid air cells clear. Bones demineralized. Atherosclerotic calcification of internal carotid and vertebral arteries at skullbase. IMPRESSION: Atrophy with small vessel chronic ischemic changes of deep cerebral white matter. No acute  intracranial abnormalities. Electronically Signed   By: Ulyses SouthwardMark  Boles M.D.   On: 09/10/2015 19:24   Dg Hip Unilat  With Pelvis 2-3 Views Right  09/10/2015  CLINICAL DATA:  Lost balance and fell while hanging a picture on wall, landing on RIGHT hip, felt a pop, RIGHT hip pain, increased pain with movement and palpation, initial encounter EXAM: DG HIP (WITH OR WITHOUT PELVIS) 2-3V RIGHT COMPARISON:  10/15/2014 FINDINGS: Osseous demineralization. Nondisplaced intertrochanteric fracture RIGHT femur with a nondisplaced fracture plane extending subtrochanteric. No dislocation. Narrowing of the hip joints bilaterally. Old healed pubic rami fractures on RIGHT. Remaining pelvis appears intact. IMPRESSION: Osseous demineralization with nondisplaced intertrochanteric fracture RIGHT femur extending subtrochanteric. Old RIGHT pubic rami fractures. Electronically Signed   By: Ulyses SouthwardMark  Boles M.D.   On: 09/10/2015 18:25   I have personally reviewed and evaluated these images and lab results as part of my medical decision-making.   EKG Interpretation None      MDM   Final diagnoses:  Dizziness    81 year old female presents with fall and right hip pain. Reports she felt dizzy and fell, landing on her right side. Reports nausea. Denies hitting her head or LOC. Denies headache, lightheadedness, vision changes, chest pain, shortness of breath, abdominal pain, N/V/D/C, numbness, weakness, paresthesia.  Patient is afebrile. Vital signs stable. Heart RRR. Lungs clear to auscultation bilaterally. Abdomen soft, non-tender, non-distended. RLE shortened. TTP over right lateral hip. Patient unable to move RLE at hip due to pain. Strength and sensation intact. Distal pulses intact. Horizontal nystagmus on eye exam. Otherwise, normal neuro exam with no focal deficit.  CBC, BMP unremarkable. Imaging of right hip demonstrates nondisplaced intertrochanteric extending subtrochanteric right femur fracture.   Patient given  meclizine for dizziness. Will obtain head CT. Imaging negative for acute intracranial abnormalities  Ortho consulted.  Last PO intake noon today. Patient complains of muscle spasms, given flexeril.  Spoke with Dr. Eulah PontMurphy (orthopedics), who will see the patient in the AM and plan for surgical repair tomorrow. Patient NPO after midnight. Hospitalist consulted. Spoke with Dr. Clyde LundborgNiu, who will see the patient in the ED. [facility]  BP 159/65 mmHg  Pulse 63  Temp(Src) 98.4 F (36.9 C) (Oral)  Resp 20  SpO2 95%   Mady Gemmalizabeth C Rosalita Carey, PA-C 09/11/15 0056  Leta BaptistEmily Roe Nguyen, MD 09/15/15 [phone]

## 2015-09-10 NOTE — Progress Notes (Signed)
PHARMACIST - PHYSICIAN ORDER COMMUNICATION  CONCERNING: P&T Medication Policy on Herbal Medications  DESCRIPTION:  This patient's order for:  BIOTIN  has been noted.  This product(s) is classified as an "herbal" or natural product. Due to a lack of definitive safety studies or FDA approval, nonstandard manufacturing practices, plus the potential risk of unknown drug-drug interactions while on inpatient medications, the Pharmacy and Therapeutics Committee does not permit the use of "herbal" or natural products of this type within [facility].   ACTION TAKEN: The pharmacy department is unable to verify this order at this time and your patient has been informed of this safety policy. Please reevaluate patient's clinical condition at discharge and address if the herbal or natural product(s) should be resumed at that time.  Terrilee FilesLeann Laurann Mcmorris, PharmD

## 2015-09-10 NOTE — H&P (Signed)
[facility] History and Physical  Candice GrosJoan S Rude ZOX:[id number] DOB: 1924-03-10 DOA: 09/10/2015  Referring physician: ED physician PCP: Candice Willis,Candice Willis, Candice Willis  Specialists:   Chief Complaint: Right hip pain after fall  HPI: Candice Willis is a 81 y.o. female with PMH of hypertension, hyperlipidemia, open, PAF (not on anticoagulant possibly due to high-risk of fall due to dizziness), CAD, dizziness, who presents with a right hip pain after fall.  Patient reports that she had diarrhea for several days, which has resolved since yesterday. She had nausea, but no abdominal pain. She developed dizziness, particularly when she rolls over on the bed. She states that he had fall and injured her right hip when she tried hanging up a picture at about 4:30 PM. She developed moderate pain over right hip and muscle spasm in the right leg, but no weakness or numbness in right leg. She does not have chest pain, shortness of breath, cough, unilateral weakness, numbness or tingling sensations in her extremities. She states that she had a history of dizziness which had resolved long time ago.   In ED, patient was found to have nondisplaced intertrochanteric right femoral fracture and old right pubic rami fracture by x-ray. WBC 4.5, temperature normal, no tachycardia, electrolytes okay, negative chest x-ray, negative CT-head for acute abnormalities. Patient is admitted to inpatient for further evaluation and treatment. Orthopedics was consulted by ED.    Where does patient live?   At home   Can patient participate in ADLs?  Some   Review of Systems:   General: no fevers, chills, no changes in body weight, has fatigue HEENT: no blurry vision, hearing changes or sore throat Pulm: no dyspnea, coughing, wheezing CV: no chest pain, palpitations Abd: had nausea and diarrhea, no vomiting, abdominal pain, constipation GU: no dysuria, burning on urination, increased urinary frequency, hematuria  Ext: no leg  edema Neuro: no unilateral weakness, numbness, or tingling, no vision change or hearing loss Skin: no rash MSK: Has muscle spasm in right leg, and tenderness over right hip Heme: No easy bruising.  Travel history: No recent long distant travel.  Allergy:  Allergies  Allergen Reactions  . Penicillins Other (See Comments)    Unknown     Past Medical History  Diagnosis Date  . Hyperlipidemia   . Hypertension   . Osteoporosis   . Macular degeneration   . Paroxysmal atrial fibrillation (HCC)   . Arteriosclerotic cardiovascular disease   . Palpitations   . Constipation   . Memory deficit   . Ulcer of hard palate   . Pain in right foot   . Thyroid nodule     Status post surgery  . Fracture of multiple pubic rami (HCC) 10/15/14    Right inferior and superior pubic rami  . Degenerative joint disease of right hip 10/15/14    Past Surgical History  Procedure Laterality Date  . Appendectomy    . Abdominal hysterectomy    . Foot surgery      left  . Knee surgery      Social History:  reports that she quit smoking about 43 years ago. Her smoking use included Cigarettes. She smoked 0.50 packs per day. She has never used smokeless tobacco. She reports that she drinks alcohol. She reports that she does not use illicit drugs.  Family History:  Family History  Problem Relation Age of Onset  . Heart disease Father   . Cancer Father     prostate  . Hyperlipidemia Sister   .  Cancer Sister     breast  . Heart disease Brother   . Hyperlipidemia Brother   . Cancer Brother     prostate  . Cancer Maternal Grandmother     colon  . Heart disease Paternal Grandmother      Prior to Admission medications   Medication Sig Start Date End Date Taking? Authorizing Provider  acetaminophen (TYLENOL) 325 MG tablet Take 650 mg by mouth every 6 (six) hours as needed for moderate pain.   Yes Historical Provider, Candice Willis  aspirin 81 MG tablet Take 81 mg by mouth daily.   Yes Historical Provider, Candice Willis   Biotin 10 MG CAPS Take 1 tablet by mouth daily.   Yes Historical Provider, Candice Willis  docusate sodium (COLACE) 100 MG capsule Take 1 capsule (100 mg total) by mouth daily. Patient taking differently: Take 100 mg by mouth daily as needed for mild constipation.  10/15/14  Yes Linwood Dibbles, Candice Willis  Fish Oil-Krill Oil (KRILL OIL PLUS) CAPS Take 1 capsule by mouth daily.   Yes Historical Provider, Candice Willis  hydrochlorothiazide (HYDRODIURIL) 12.5 MG tablet Take 12.5 mg by mouth daily. 08/31/15  Yes Historical Provider, Candice Willis  HYDROcodone-acetaminophen (NORCO/VICODIN) 5-325 MG per tablet Take 1-2 tablets by mouth every 6 (six) hours as needed. Patient taking differently: Take 1-2 tablets by mouth every 6 (six) hours as needed for severe pain.  10/15/14  Yes Linwood Dibbles, Candice Willis  Multiple Vitamin (MULTIVITAMIN WITH MINERALS) TABS Take 1 tablet by mouth daily.   Yes Historical Provider, Candice Willis  Doxylamine Succinate, Sleep, (SLEEP AID PO) Take 1 tablet by mouth at bedtime as needed (for sleep).    Historical Provider, Candice Willis  simvastatin (ZOCOR) 40 MG tablet Take 40 mg by mouth daily.    Historical Provider, Candice Willis    Physical Exam: Filed Vitals:   09/10/15 2003 09/10/15 2154 09/10/15 2158 09/10/15 2310  BP: 158/65 159/65 159/65 148/53  Pulse: 58 63 63 63  Temp:    98.2 F (36.8 C)  TempSrc:      Resp: SpO2: 98% 95% 95% 97%   General: Not in acute distress HEENT:       Eyes: PERRL, EOMI, no scleral icterus.       ENT: No discharge from the ears and nose, no pharynx injection, no tonsillar enlargement. Horizontal eye nystagmus       Neck: No JVD, no bruit, no mass felt. Heme: No neck lymph node enlargement. Cardiac: S1/S2, RRR, No murmurs, No gallops or rubs. Pulm:  No rales, wheezing, rhonchi or rubs. Abd: Soft, nondistended, nontender, no rebound pain, no organomegaly, BS present. Ext: No pitting leg edema bilaterally. 2+DP/PT pulse bilaterally. Musculoskeletal: Tenderness over right hip.Right leg is shortened and  externally rotated. Skin: No rashes.  Neuro: Alert, oriented X3, cranial nerves II-XII grossly intact, muscle strength 5/5 in all extremities, sensation to light touch intact. Brachial reflex 1+ bilaterally. Knee reflex 1+ bilaterally. Negative Babinski's sign. Normal finger to nose test. Psych: Patient is not psychotic, no suicidal or hemocidal ideation.  Labs on Admission:  Basic Metabolic Panel:  Recent Labs Lab 09/10/15 1835  NA 134*  K 3.5  CL 102  CO2 23  GLUCOSE 151*  BUN 13  CREATININE 0.70  CALCIUM 9.1   Liver Function Tests: No results for input(s): AST, ALT, ALKPHOS, BILITOT, PROT, ALBUMIN in the last 168 hours. No results for input(s): LIPASE, AMYLASE in the last 168 hours. No results for input(s): AMMONIA in the last 168 hours. CBC:  Recent Labs Lab 09/10/15 1835  WBC 4.5  NEUTROABS 3.2  HGB 13.0  HCT 37.0  MCV 88.7  PLT 223   Cardiac Enzymes: No results for input(s): CKTOTAL, CKMB, CKMBINDEX, TROPONINI in the last 168 hours.  BNP (last 3 results) No results for input(s): BNP in the last 8760 hours.  ProBNP (last 3 results) No results for input(s): PROBNP in the last 8760 hours.  CBG: No results for input(s): GLUCAP in the last 168 hours.  Radiological Exams on Admission: Dg Chest 1 View  09/10/2015  CLINICAL DATA:  Right hip pain after falling today. History of pelvic fractures. Initial encounter. EXAM: CHEST 1 VIEW COMPARISON:  10/15/2014 radiographs. FINDINGS: 1814 hours. The heart size and mediastinal contours are stable. There is aortic atherosclerosis and mild chronic vascular congestion. The lungs are otherwise clear. No pleural effusion, pneumothorax or acute osseous abnormality identified. IMPRESSION: No acute posttraumatic findings or active cardiopulmonary process identified. Electronically Signed   By: Carey Bullocks M.D.   On: 09/10/2015 18:26   Ct Head Wo Contrast  09/10/2015  CLINICAL DATA:  Lost balance while hanging a picture on  the wall, fell landing on RIGHT hip, dizziness, suffered a RIGHT hip fracture, nausea, hypertension, atrial fibrillation, former smoker EXAM: CT HEAD WITHOUT CONTRAST TECHNIQUE: Contiguous axial images were obtained from the base of the skull through the vertex without intravenous contrast. COMPARISON:  10/07/2012 FINDINGS: Generalized atrophy. Normal ventricular morphology. No midline shift or mass effect. Small vessel chronic ischemic changes of deep cerebral white matter. No intracranial hemorrhage, mass lesion, or acute infarction. Visualized paranasal sinuses and mastoid air cells clear. Bones demineralized. Atherosclerotic calcification of internal carotid and vertebral arteries at skullbase. IMPRESSION: Atrophy with small vessel chronic ischemic changes of deep cerebral white matter. No acute intracranial abnormalities. Electronically Signed   By: Ulyses Southward M.D.   On: 09/10/2015 19:24   Dg Hip Unilat  With Pelvis 2-3 Views Right  09/10/2015  CLINICAL DATA:  Lost balance and fell while hanging a picture on wall, landing on RIGHT hip, felt a pop, RIGHT hip pain, increased pain with movement and palpation, initial encounter EXAM: DG HIP (WITH OR WITHOUT PELVIS) 2-3V RIGHT COMPARISON:  10/15/2014 FINDINGS: Osseous demineralization. Nondisplaced intertrochanteric fracture RIGHT femur with a nondisplaced fracture plane extending subtrochanteric. No dislocation. Narrowing of the hip joints bilaterally. Old healed pubic rami fractures on RIGHT. Remaining pelvis appears intact. IMPRESSION: Osseous demineralization with nondisplaced intertrochanteric fracture RIGHT femur extending subtrochanteric. Old RIGHT pubic rami fractures. Electronically Signed   By: Ulyses Southward M.D.   On: 09/10/2015 18:25    EKG: Independently reviewed.  Abnormal findings: First degree AV block, occasional PVC  Assessment/Plan Principal Problem:   Hip fracture, right (HCC) Active Problems:   Hyperlipidemia   Essential  hypertension   Osteoporosis   Memory deficit   Thyroid nodule   Fall   Dizziness   Closed right hip fracture (HCC)   Hip fracture (HCC)   Diarrhea   PAF (paroxysmal atrial fibrillation) (HCC)   Hip fracture, right (HCC): As evidenced by x-ray. Patient has moderate pain now. No neurovascular compromise. Orthopedic surgeon was consulted. Dr. Eulah Pont will likely do surgery tonight.  - will admit to tele bed - Pain control: morphine prn and Norco - When necessary Flexeril for muscle spasm - follow up ortho recs - NPO - type and cross - INR/PTT  Dizziness: Most likely due to orthostatic vital signs secondary to dehydration due to recent diarrhea. She reports that she had  a history of vertigo, which had resolved long time ago. On physical examination, patient has horizontal eye nystagmus, but otherwise no other focal neurologic findings. Less likely to have TIA or stroke, though we can not completely rule out this possibility. Patient had a history of PAF, but not using anticoagulant, possibly because of high risk of fall due to vertigo. This increases the risk of stroke. -will check orthostatic vital signs -IV fluid: Normal saline 100 mL/h -Vestibular PT -if not improving, may get MRI of brain (not ordered). -On ASA -prn meclizine  HTN: -Hold HCTZ (will give some IVF while on NPO and need surgery) -IV hydralazine when necessary  HLD: Last LDL was 62 on 10/21/14 -Continue home medications: Zocor  Diarrhea: Has resolved. -IVF as above  Atrial Fibrillation: CHA2DS2-VASc Score is 4, needs oral anticoagulation. Patient is not on AC at home, possibly due to high risk of fall due to vertigo. Now with Hip fracture, can not start AC. Heart rate is well controlled without CCB or BB. Patient has a sinus rhythm on admission. -tele monitorinng   DVT ppx: SCD  Code Status: Full code Family Communication: None at bed side. Disposition Plan: Admit to inpatient   Date of Service 09/11/2015     Lorretta Harp [facility] Pager [phone]  If 7PM-7AM, please contact night-coverage [email or web address] Password [email or web address] 09/11/2015, 12:44 AM

## 2015-09-11 ENCOUNTER — Inpatient Hospital Stay (HOSPITAL_COMMUNITY): Payer: Medicare Other

## 2015-09-11 ENCOUNTER — Inpatient Hospital Stay (HOSPITAL_COMMUNITY): Payer: Medicare Other | Admitting: Anesthesiology

## 2015-09-11 ENCOUNTER — Encounter (HOSPITAL_COMMUNITY)
Admission: EM | Disposition: A | Discharge status: Skilled Nursing Facility | Payer: Self-pay | Source: Home / Self Care | Attending: Internal Medicine

## 2015-09-11 DIAGNOSIS — R42 Dizziness and giddiness: Secondary | ICD-10-CM

## 2015-09-11 DIAGNOSIS — I480 Paroxysmal atrial fibrillation: Secondary | ICD-10-CM | POA: Diagnosis present

## 2015-09-11 HISTORY — PX: INTRAMEDULLARY (IM) NAIL INTERTROCHANTERIC: SHX5875

## 2015-09-11 LAB — ABO/RH: ABO/RH(D): A POS

## 2015-09-11 LAB — SURGICAL PCR SCREEN
MRSA, PCR: NEGATIVE
Staphylococcus aureus: POSITIVE — AB

## 2015-09-11 LAB — PROTIME-INR
INR: 1.05 (ref 0.00–1.49)
PROTHROMBIN TIME: 13.9 s (ref 11.6–15.2)

## 2015-09-11 LAB — APTT: aPTT: 26 seconds (ref 24–37)

## 2015-09-11 LAB — GLUCOSE, CAPILLARY: GLUCOSE-CAPILLARY: 126 mg/dL — AB (ref 65–99)

## 2015-09-11 SURGERY — FIXATION, FRACTURE, INTERTROCHANTERIC, WITH INTRAMEDULLARY ROD
Anesthesia: General | Site: Hip | Laterality: Right

## 2015-09-11 MED ORDER — PROPOFOL 10 MG/ML IV BOLUS
INTRAVENOUS | Status: DC | PRN
  Administered 2015-09-11: 130 mg via INTRAVENOUS

## 2015-09-11 MED ORDER — SODIUM CHLORIDE 0.9 % IJ SOLN
INTRAMUSCULAR | Status: AC
  Filled 2015-09-11: qty 10

## 2015-09-11 MED ORDER — NEOSTIGMINE METHYLSULFATE 10 MG/10ML IV SOLN
INTRAVENOUS | Status: AC
  Filled 2015-09-11: qty 1

## 2015-09-11 MED ORDER — SUCCINYLCHOLINE CHLORIDE 20 MG/ML IJ SOLN
INTRAMUSCULAR | Status: DC | PRN
  Administered 2015-09-11: 100 mg via INTRAVENOUS

## 2015-09-11 MED ORDER — LACTATED RINGERS IV SOLN
INTRAVENOUS | Status: DC | PRN
  Administered 2015-09-11: 06:00:00 via INTRAVENOUS

## 2015-09-11 MED ORDER — HYDROCODONE-ACETAMINOPHEN 5-325 MG PO TABS: 1.0000 | ORAL_TABLET | Freq: Four times a day (QID) | ORAL | Status: DC | PRN

## 2015-09-11 MED ORDER — CEFAZOLIN SODIUM-DEXTROSE 2-3 GM-% IV SOLR
2.0000 g | Freq: Four times a day (QID) | INTRAVENOUS | Status: AC
  Administered 2015-09-11 (×2): 2 g via INTRAVENOUS
  Filled 2015-09-11 (×2): qty 50

## 2015-09-11 MED ORDER — ASPIRIN EC 325 MG PO TBEC
325.0000 mg | DELAYED_RELEASE_TABLET | Freq: Every day | ORAL | Status: DC
  Administered 2015-09-12 – 2015-09-14 (×3): 325 mg via ORAL
  Filled 2015-09-11 (×3): qty 1

## 2015-09-11 MED ORDER — SUCCINYLCHOLINE CHLORIDE 20 MG/ML IJ SOLN
INTRAMUSCULAR | Status: AC
  Filled 2015-09-11: qty 1

## 2015-09-11 MED ORDER — PROPOFOL 10 MG/ML IV BOLUS
INTRAVENOUS | Status: AC
  Filled 2015-09-11: qty 20

## 2015-09-11 MED ORDER — FENTANYL CITRATE (PF) 100 MCG/2ML IJ SOLN
INTRAMUSCULAR | Status: AC
  Filled 2015-09-11: qty 2

## 2015-09-11 MED ORDER — METOCLOPRAMIDE HCL 5 MG PO TABS: 5.0000 mg | ORAL_TABLET | Freq: Three times a day (TID) | ORAL | Status: DC | PRN

## 2015-09-11 MED ORDER — GLYCOPYRROLATE 0.2 MG/ML IJ SOLN
INTRAMUSCULAR | Status: DC | PRN
  Administered 2015-09-11: 0.2 mg via INTRAVENOUS

## 2015-09-11 MED ORDER — LIDOCAINE HCL (CARDIAC) 20 MG/ML IV SOLN
INTRAVENOUS | Status: AC
  Filled 2015-09-11: qty 5

## 2015-09-11 MED ORDER — EPHEDRINE SULFATE 50 MG/ML IJ SOLN
INTRAMUSCULAR | Status: AC
  Filled 2015-09-11: qty 1

## 2015-09-11 MED ORDER — LIDOCAINE HCL (CARDIAC) 20 MG/ML IV SOLN
50.0000 mg | Freq: Once | INTRAVENOUS | Status: AC
  Administered 2015-09-11: 50 mg via INTRAVENOUS

## 2015-09-11 MED ORDER — FENTANYL CITRATE (PF) 100 MCG/2ML IJ SOLN
25.0000 ug | INTRAMUSCULAR | Status: DC | PRN
  Administered 2015-09-11: 25 ug via INTRAVENOUS

## 2015-09-11 MED ORDER — FENTANYL CITRATE (PF) 250 MCG/5ML IJ SOLN
INTRAMUSCULAR | Status: AC
  Filled 2015-09-11: qty 5

## 2015-09-11 MED ORDER — ASPIRIN 325 MG PO TABS: 325.0000 mg | ORAL_TABLET | Freq: Every day | ORAL | Status: DC

## 2015-09-11 MED ORDER — CEFAZOLIN SODIUM 1-5 GM-% IV SOLN
INTRAVENOUS | Status: AC
  Filled 2015-09-11: qty 50

## 2015-09-11 MED ORDER — LIDOCAINE HCL (PF) 2 % IJ SOLN: 50.0000 mg | Freq: Once | INTRAMUSCULAR | Status: DC

## 2015-09-11 MED ORDER — GLYCOPYRROLATE 0.2 MG/ML IJ SOLN
INTRAMUSCULAR | Status: AC
  Filled 2015-09-11: qty 2

## 2015-09-11 MED ORDER — ONDANSETRON HCL 4 MG/2ML IJ SOLN
INTRAMUSCULAR | Status: AC
  Filled 2015-09-11: qty 2

## 2015-09-11 MED ORDER — METOCLOPRAMIDE HCL 5 MG/ML IJ SOLN: 5.0000 mg | Freq: Three times a day (TID) | INTRAMUSCULAR | Status: DC | PRN

## 2015-09-11 MED ORDER — PHENOL 1.4 % MT LIQD: 1.0000 | OROMUCOSAL | Status: DC | PRN

## 2015-09-11 MED ORDER — 0.9 % SODIUM CHLORIDE (POUR BTL) OPTIME
TOPICAL | Status: DC | PRN
  Administered 2015-09-11: 1000 mL

## 2015-09-11 MED ORDER — MENTHOL 3 MG MT LOZG: 1.0000 | LOZENGE | OROMUCOSAL | Status: DC | PRN

## 2015-09-11 MED ORDER — EPHEDRINE SULFATE 50 MG/ML IJ SOLN
INTRAMUSCULAR | Status: DC | PRN
  Administered 2015-09-11: 10 mg via INTRAVENOUS

## 2015-09-11 MED ORDER — LIDOCAINE HCL (CARDIAC) 20 MG/ML IV SOLN
INTRAVENOUS | Status: DC | PRN
  Administered 2015-09-11: 60 mg via INTRAVENOUS

## 2015-09-11 MED ORDER — LIDOCAINE HCL (PF) 2 % IJ SOLN
50.0000 mg | Freq: Once | INTRAMUSCULAR | Status: DC
  Administered 2015-09-11: 50 mg

## 2015-09-11 MED ORDER — ROCURONIUM BROMIDE 50 MG/5ML IV SOLN
INTRAVENOUS | Status: AC
  Filled 2015-09-11: qty 1

## 2015-09-11 MED ORDER — CEFAZOLIN SODIUM 1-5 GM-% IV SOLN
INTRAVENOUS | Status: DC | PRN
  Administered 2015-09-11: 1 g via INTRAVENOUS

## 2015-09-11 MED ORDER — FENTANYL CITRATE (PF) 100 MCG/2ML IJ SOLN
INTRAMUSCULAR | Status: DC | PRN
  Administered 2015-09-11 (×3): 50 ug via INTRAVENOUS

## 2015-09-11 MED ORDER — SODIUM CHLORIDE 0.9 % IV SOLN
10.0000 mg | INTRAVENOUS | Status: DC | PRN
  Administered 2015-09-11: 50 ug/min via INTRAVENOUS

## 2015-09-11 SURGICAL SUPPLY — 41 items
BIT DRILL AO GAMMA 4.2X180 (BIT) ×2 IMPLANT
BNDG COHESIVE 4X5 TAN STRL (GAUZE/BANDAGES/DRESSINGS) ×2 IMPLANT
BNDG GAUZE ELAST 4 BULKY (GAUZE/BANDAGES/DRESSINGS) ×2 IMPLANT
CLSR STERI-STRIP ANTIMIC 1/2X4 (GAUZE/BANDAGES/DRESSINGS) ×2 IMPLANT
COVER PERINEAL POST (MISCELLANEOUS) ×2 IMPLANT
COVER SURGICAL LIGHT HANDLE (MISCELLANEOUS) ×2 IMPLANT
DRAPE STERI IOBAN 125X83 (DRAPES) ×2 IMPLANT
DRSG MEPILEX BORDER 4X4 (GAUZE/BANDAGES/DRESSINGS) ×6 IMPLANT
DURAPREP 26ML APPLICATOR (WOUND CARE) ×2 IMPLANT
ELECT REM PT RETURN 9FT ADLT (ELECTROSURGICAL) ×2
ELECTRODE REM PT RTRN 9FT ADLT (ELECTROSURGICAL) ×1 IMPLANT
GLOVE BIO SURGEON STRL SZ7 (GLOVE) ×4 IMPLANT
GLOVE BIO SURGEON STRL SZ7.5 (GLOVE) ×2 IMPLANT
GLOVE BIOGEL PI IND STRL 7.0 (GLOVE) ×2 IMPLANT
GLOVE BIOGEL PI IND STRL 8 (GLOVE) ×1 IMPLANT
GLOVE BIOGEL PI INDICATOR 7.0 (GLOVE) ×2
GLOVE BIOGEL PI INDICATOR 8 (GLOVE) ×1
GOWN STRL REUS W/ TWL LRG LVL3 (GOWN DISPOSABLE) ×2 IMPLANT
GOWN STRL REUS W/TWL LRG LVL3 (GOWN DISPOSABLE) ×2
GUIDEROD T2 3X1000 (ROD) ×4 IMPLANT
K-WIRE  3.2X450M STR (WIRE) ×1
K-WIRE 3.2X450M STR (WIRE) ×1
KIT NAIL LONG 10X380MMX125 (Nail) ×2 IMPLANT
KIT ROOM TURNOVER OR (KITS) ×2 IMPLANT
KWIRE 3.2X450M STR (WIRE) ×1 IMPLANT
MANIFOLD NEPTUNE II (INSTRUMENTS) ×2 IMPLANT
NS IRRIG 1000ML POUR BTL (IV SOLUTION) ×2 IMPLANT
PACK GENERAL/GYN (CUSTOM PROCEDURE TRAY) ×2 IMPLANT
PAD ARMBOARD 7.5X6 YLW CONV (MISCELLANEOUS) ×2 IMPLANT
PAD CAST 4YDX4 CTTN HI CHSV (CAST SUPPLIES) ×1 IMPLANT
PADDING CAST COTTON 4X4 STRL (CAST SUPPLIES) ×1
SCREW LAG GAMMA 3 TI 10.5X80MM (Screw) ×2 IMPLANT
SCREW LOCKING T2 F/T  5MMX40MM (Screw) ×1 IMPLANT
SCREW LOCKING T2 F/T 5MMX40MM (Screw) ×1 IMPLANT
SUT MNCRL AB 4-0 PS2 18 (SUTURE) IMPLANT
SUT MON AB 2-0 CT1 27 (SUTURE) ×2 IMPLANT
SUT MON AB 2-0 CT1 36 (SUTURE) ×2 IMPLANT
SUT VIC AB 0 CT1 27 (SUTURE) ×2
SUT VIC AB 0 CT1 27XBRD ANBCTR (SUTURE) ×2 IMPLANT
TOWEL OR 17X24 6PK STRL BLUE (TOWEL DISPOSABLE) ×2 IMPLANT
TOWEL OR 17X26 10 PK STRL BLUE (TOWEL DISPOSABLE) ×2 IMPLANT

## 2015-09-11 NOTE — Clinical Social Work Note (Addendum)
Clinical Social Work Assessment  Patient Details  Name: Candice Willis MRN: [id number] Date of Birth: April 21, 1924  Date of referral:  09/11/15               Reason for consult:  Facility Placement                Permission sought to share information with:  Facility Medical sales representativeContact Representative, Family Supports Permission granted to share information::  Yes, Verbal Permission Granted  Name::     Felipa EvenerDiana Clark Patient's friend  Agency::  [facility]  Relationship::     Contact Information:     Housing/Transportation Living arrangements for the past 2 months:  Skilled Nursing Facility Source of Information:  Patient, Friend/Neighbor Patient Interpreter Needed:  None Criminal Activity/Legal Involvement Pertinent to Current Situation/Hospitalization:  No - Comment as needed Significant Relationships:  Friend Lives with:  Facility Resident Do you feel safe going back to the place where you live?  Yes (Patient feels she needs some short term rehab in order to [facility] Need for family participation in patient care:  Yes (Comment) (Patient requests to have friend help with discharge planning)  Care giving concerns: Patient expresses that she wants some short term rehab before returning back to her independent living.   Social Worker assessment / plan:  Patient is a pleasant and talkative 81 year old female who lives at [facility].  Patient is alert and oriented x4.  Patient states she has been at rehab before and was pleased with the experience that she had there.  Patient expressed that she is looking forward to discharging back to SNF in order to get her strength back up and return back to her own home.  Employment status:  Retired Database administratornsurance information:  Managed Medicare PT Recommendations:  Skilled Nursing Facility Information / Referral to community resources:  Skilled Nursing  Facility  Patient/Family's Response to care:  Patient is in agreement [facility].  Patient/Family's Understanding of and Emotional Response to Diagnosis, Current Treatment, and Prognosis:  Patient is aware of her prognosis and diagnosis along with her current treatment plan. Emotional Assessment Appearance:  Appears stated age Attitude/Demeanor/Rapport:    Affect (typically observed):  Appropriate, Calm, Hopeful, Stable, Pleasant Orientation:  Oriented to Self, Oriented to Place, Oriented to  Time, Oriented to Situation Alcohol / Substance use:  Not Applicable Psych involvement (Current and /or in the community):  No (Comment)  Discharge Needs  Concerns to be addressed:  No discharge needs identified Readmission within the last 30 days:  No Current discharge risk:  None Barriers to Discharge:  No Barriers Identified   Darleene Cleavernterhaus, Shontez Sermon R, LCSWA 09/11/2015, 9:25 PM

## 2015-09-11 NOTE — Care Management (Signed)
Utilization review completed. Wyeth Hoffer, RN Case Manager [phone]. 

## 2015-09-11 NOTE — Progress Notes (Signed)
pts ekg indicating vent bigeminy dr Randa Evensedwards called and notifed of pts ekg change coming to see pt

## 2015-09-11 NOTE — Op Note (Signed)
DATE OF SURGERY:  09/11/2015  TIME: 7:52 AM  PATIENT NAME:  Candice Willis  AGE: 81 y.o.  PRE-OPERATIVE DIAGNOSIS:  Right hip fracture  POST-OPERATIVE DIAGNOSIS:  SAME  PROCEDURE:  INTRAMEDULLARY (IM) NAIL INTERTROCHANTRIC  SURGEON:  Sorah Falkenstein D  ASSISTANT:  Janalee DaneBrittney Kelly, PA-C, She was present and scrubbed throughout the case, critical for completion in a timely fashion, and for retraction, instrumentation, and closure.   OPERATIVE IMPLANTS: Stryker Gamma Nail with distal interlock screw  PREOPERATIVE INDICATIONS:  Candice Willis is a 81 y.o. year old who fell and suffered a hip fracture. She was brought into the ER and then admitted and optimized and then elected for surgical intervention.    The risks benefits and alternatives were discussed with the patient including but not limited to the risks of nonoperative treatment, versus surgical intervention including infection, bleeding, nerve injury, malunion, nonunion, hardware prominence, hardware failure, need for hardware removal, blood clots, cardiopulmonary complications, morbidity, mortality, among others, and they were willing to proceed.    OPERATIVE PROCEDURE:  The patient was brought to the operating room and placed in the supine position. General anesthesia was administered, with a foley. She was placed on the fracture table.  Closed reduction was performed under C-arm guidance. The length of the femur was also measured using fluoroscopy. Time out was then performed after sterile prep and drape. She received preoperative antibiotics.  Incision was made proximal to the greater trochanter. A guidewire was placed in the appropriate position. Confirmation was made on AP and lateral views. The above-named nail was opened. I opened the proximal femur with a reamer. I then placed the nail by hand easily down. I did not need to ream the femur.  Once the nail was completely seated, I placed a guidepin into the femoral head  into the center center position. I measured the length, and then reamed the lateral cortex and up into the head. I then placed the lag screw. Slight compression was applied. Anatomic fixation achieved. Bone quality was mediocre.  I then secured the proximal interlocking bolt, and took off a half a turn, and then removed the instruments, and took final C-arm pictures AP and lateral the entire length of the leg.  I then used perfect circles technique to place a distal interlock screw.   Anatomic reconstruction was achieved, and the wounds were irrigated copiously and closed with Vicryl followed by staples and sterile gauze for the skin. The patient was awakened and returned to PACU in stable and satisfactory condition. There no complications and the patient tolerated the procedure well.  She will be weightbearing as tolerated, and will be on ASA 325  for a period of four weeks after discharge.   Margarita Ranaimothy Kamauri Kathol, M.D.    This note was generated using a template and dragon dictation system. In light of that, I have reviewed the note and all aspects of it are applicable to this case. Any dictation errors are due to the computerized dictation system.

## 2015-09-11 NOTE — Progress Notes (Signed)
[facility] PROGRESS NOTE  Candice Willis ZOX:[id number] DOB: Aug 02, 1924 DOA: 09/10/2015 PCP: Paulino Rily, MD  Assessment/Plan:  Principal Problem:   Hip fracture, right (HCC) s/p IM nail 10/21 by Dr. Wandra Feinstein. Up with PT. ASA for DVT prophylaxis per ortho Active Problems:   Hyperlipidemia   Essential hypertension   Osteoporosis   Memory deficit   Thyroid nodule   Fall   Vertigo: PT, meclizine prn   Diarrhea: none further   PAF (paroxysmal atrial fibrillation) (HCC)  Chadvasc 4. Rate controlled   Code Status:  full Family Communication:   Friend at bedside Disposition Plan:  Likely SNF  Consultants:  ortho  Procedures:   IM nail, right  Antibiotics:    HPI/Subjective:  Some pain. No nausea. No other complaints.  Objective: Filed Vitals:   09/11/15 1327  BP:   Pulse: 57  Temp: 97.7 F (36.5 C)  Resp: 16    Intake/Output Summary (Last 24 hours) at 09/11/15 1449 Last data filed at 09/11/15 0730  Gross per 24 hour  Intake    700 ml  Output    150 ml  Net    550 ml   There were no vitals filed for this visit.  Exam:   General:   Asleep. Arousable. Comfortable and talkative.   HEENT: Some nystagmus horizontal.  Voice mucous membranes.  Cardiovascular:  Irregularly irregular without murmurs gallops rubs  Respiratory:  Clear to auscultation bilaterally without wheezes rhonchi or rales  Abdomen:  Soft nontender nondistended  Ext:  No edema. Dressings in place.   Neuro: Nonfocal  Basic Metabolic Panel:  Recent Labs Lab 09/10/15 1835  NA 134*  K 3.5  CL 102  CO2 23  GLUCOSE 151*  BUN 13  CREATININE 0.70  CALCIUM 9.1   Liver Function Tests: No results for input(s): AST, ALT, ALKPHOS, BILITOT, PROT, ALBUMIN in the last 168 hours. No results for input(s): LIPASE, AMYLASE in the last 168 hours. No results for input(s): AMMONIA in the last 168 hours. CBC:  Recent Labs Lab 09/10/15 1835  WBC 4.5  NEUTROABS 3.2  HGB 13.0   HCT 37.0  MCV 88.7  PLT 223   Cardiac Enzymes: No results for input(s): CKTOTAL, CKMB, CKMBINDEX, TROPONINI in the last 168 hours. BNP (last 3 results) No results for input(s): BNP in the last 8760 hours.  ProBNP (last 3 results) No results for input(s): PROBNP in the last 8760 hours.  CBG:  Recent Labs Lab 09/11/15 0454  GLUCAP 126*    Recent Results (from the past 240 hour(s))  Surgical pcr screen     Status: Abnormal   Collection Time: 09/11/15  4:39 AM  Result Value Ref Range Status   MRSA, PCR NEGATIVE NEGATIVE Final   Staphylococcus aureus POSITIVE (A) NEGATIVE Final    Comment:        The Xpert SA Assay (FDA approved for NASAL specimens in patients over 35 years of age), is one component of a comprehensive surveillance program.  Test performance has been validated by [facility] for patients greater than or equal to 85 year old. It is not intended to diagnose infection nor to guide or monitor treatment.      Studies: Dg Chest 1 View  09/10/2015  CLINICAL DATA:  Right hip pain after falling today. History of pelvic fractures. Initial encounter. EXAM: CHEST 1 VIEW COMPARISON:  10/15/2014 radiographs. FINDINGS: 1814 hours. The heart size and mediastinal contours are stable. There is aortic atherosclerosis and mild chronic vascular  congestion. The lungs are otherwise clear. No pleural effusion, pneumothorax or acute osseous abnormality identified. IMPRESSION: No acute posttraumatic findings or active cardiopulmonary process identified. Electronically Signed   By: Carey Bullocks M.D.   On: 09/10/2015 18:26   Ct Head Wo Contrast  09/10/2015  CLINICAL DATA:  Lost balance while hanging a picture on the wall, fell landing on RIGHT hip, dizziness, suffered a RIGHT hip fracture, nausea, hypertension, atrial fibrillation, former smoker EXAM: CT HEAD WITHOUT CONTRAST TECHNIQUE: Contiguous axial images were obtained from the base of the skull through the vertex without  intravenous contrast. COMPARISON:  10/07/2012 FINDINGS: Generalized atrophy. Normal ventricular morphology. No midline shift or mass effect. Small vessel chronic ischemic changes of deep cerebral white matter. No intracranial hemorrhage, mass lesion, or acute infarction. Visualized paranasal sinuses and mastoid air cells clear. Bones demineralized. Atherosclerotic calcification of internal carotid and vertebral arteries at skullbase. IMPRESSION: Atrophy with small vessel chronic ischemic changes of deep cerebral white matter. No acute intracranial abnormalities. Electronically Signed   By: Ulyses Southward M.D.   On: 09/10/2015 19:24   Pelvis Portable  09/11/2015  CLINICAL DATA:  Postop from internal fixation of right hip fracture. EXAM: PORTABLE PELVIS 1-2 VIEWS COMPARISON:  09/10/2015 FINDINGS: Screw and intramedullary rod fixation of intertrochanteric and subtrochanteric right hip fracture is now seen, which is in near anatomic alignment. No evidence of dislocation. Old fracture deformities of the right superior and inferior pubic rami again noted. IMPRESSION: Internal fixation of right hip fracture in near anatomic alignment. Electronically Signed   By: Myles Rosenthal M.D.   On: 09/11/2015 08:53   Dg C-arm 1-60 Min  09/11/2015  CLINICAL DATA:  81 year old female undergoing repair of intertrochanteric fracture EXAM: RIGHT FEMUR 2 VIEWS; DG C-ARM 61-120 MIN COMPARISON:  Preoperative radiographs 09/10/2015 FINDINGS: Interval operative repair of intertrochanteric fracture with intra medullary nail (single distal interlocking screw) and a transfemoral neck lag screw. There is improvement in alignment of the fracture fragments compared to the preoperative radiographs. No evidence of immediate hardware complication. IMPRESSION: ORIF of right intertrochanteric fracture as above. Electronically Signed   By: Malachy Moan M.D.   On: 09/11/2015 08:06   Dg Hip Unilat  With Pelvis 2-3 Views Right  09/10/2015   CLINICAL DATA:  Lost balance and fell while hanging a picture on wall, landing on RIGHT hip, felt a pop, RIGHT hip pain, increased pain with movement and palpation, initial encounter EXAM: DG HIP (WITH OR WITHOUT PELVIS) 2-3V RIGHT COMPARISON:  10/15/2014 FINDINGS: Osseous demineralization. Nondisplaced intertrochanteric fracture RIGHT femur with a nondisplaced fracture plane extending subtrochanteric. No dislocation. Narrowing of the hip joints bilaterally. Old healed pubic rami fractures on RIGHT. Remaining pelvis appears intact. IMPRESSION: Osseous demineralization with nondisplaced intertrochanteric fracture RIGHT femur extending subtrochanteric. Old RIGHT pubic rami fractures. Electronically Signed   By: Ulyses Southward M.D.   On: 09/10/2015 18:25   Dg Femur, Min 2 Views Right  09/11/2015  CLINICAL DATA:  81 year old female undergoing repair of intertrochanteric fracture EXAM: RIGHT FEMUR 2 VIEWS; DG C-ARM 61-120 MIN COMPARISON:  Preoperative radiographs 09/10/2015 FINDINGS: Interval operative repair of intertrochanteric fracture with intra medullary nail (single distal interlocking screw) and a transfemoral neck lag screw. There is improvement in alignment of the fracture fragments compared to the preoperative radiographs. No evidence of immediate hardware complication. IMPRESSION: ORIF of right intertrochanteric fracture as above. Electronically Signed   By: Malachy Moan M.D.   On: 09/11/2015 08:06    Scheduled Meds: . [START ON 09/12/2015]  aspirin EC  325 mg Oral Q breakfast  . ceFAZolin      .  ceFAZolin (ANCEF) IV  2 g Intravenous Q6H  . fentaNYL      . lidocaine (cardiac) 100 mg/545ml      . multivitamin with minerals  1 tablet Oral Daily  . omega-3 acid ethyl esters  1 g Oral Daily  . simvastatin  40 mg Oral Daily  . sodium chloride  3 mL Intravenous Q12H   Continuous Infusions: . sodium chloride 100 mL/hr at 09/10/15 2313    Time spent: 25 minutes  Asjia Berrios L  [facility] [email or web address], password [email or web address] 09/11/2015, 2:49 PM  LOS: 1 day

## 2015-09-11 NOTE — Anesthesia Procedure Notes (Signed)
Procedure Name: Intubation Date/Time: 09/11/2015 6:39 AM Performed by: Quentin OreWALKER, Viraj Liby E Pre-anesthesia Checklist: Patient identified, Emergency Drugs available, Suction available, Patient being monitored and Timeout performed Patient Re-evaluated:Patient Re-evaluated prior to inductionOxygen Delivery Method: Circle system utilized Preoxygenation: Pre-oxygenation with 100% oxygen Intubation Type: IV induction and Cricoid Pressure applied Laryngoscope Size: Mac and 3 Grade View: Grade I Tube type: Oral Tube size: 7.5 mm Number of attempts: 1 Airway Equipment and Method: Stylet Placement Confirmation: ETT inserted through vocal cords under direct vision,  positive ETCO2 and breath sounds checked- equal and bilateral Secured at: 22 cm Tube secured with: Tape Dental Injury: Teeth and Oropharynx as per pre-operative assessment

## 2015-09-11 NOTE — Anesthesia Preprocedure Evaluation (Addendum)
Anesthesia Evaluation  Patient identified by MRN, date of birth, ID band Patient awake    Reviewed: Allergy & Precautions, NPO status , Patient's Chart, lab work & pertinent test results  History of Anesthesia Complications Negative for: history of anesthetic complications  Airway Mallampati: II  TM Distance: >3 FB Neck ROM: Full    Dental   Pulmonary former smoker,    breath sounds clear to auscultation       Cardiovascular hypertension, Pt. on medications  Rhythm:Regular Rate:Normal     Neuro/Psych negative neurological ROS     GI/Hepatic negative GI ROS, Neg liver ROS,   Endo/Other  negative endocrine ROS  Renal/GU negative Renal ROS     Musculoskeletal  (+) Arthritis , Osteoarthritis,    Abdominal   Peds  Hematology   Anesthesia Other Findings   Reproductive/Obstetrics                           Anesthesia Physical Anesthesia Plan  ASA: III  Anesthesia Plan: General   Post-op Pain Management:    Induction: Intravenous  Airway Management Planned: Oral ETT  Additional Equipment:   Intra-op Plan:   Post-operative Plan: Possible Post-op intubation/ventilation  Informed Consent: I have reviewed the patients History and Physical, chart, labs and discussed the procedure including the risks, benefits and alternatives for the proposed anesthesia with the patient or authorized representative who has indicated his/her understanding and acceptance.   Dental advisory given  Plan Discussed with: CRNA and Anesthesiologist  Anesthesia Plan Comments:         Anesthesia Quick Evaluation

## 2015-09-11 NOTE — Discharge Instructions (Signed)
INSTRUCTIONS ° °o Remove items at home which could result in a fall. This includes throw rugs or furniture in walking pathways °o ICE to the affected joint every three hours while awake for 30 minutes at a time, for at least the first 3-5 days, and then as needed for pain and swelling.  Continue to use ice for pain and swelling. You may notice swelling that will progress down to the foot and ankle.  This is normal after surgery.  Elevate your leg when you are not up walking on it.   °o Continue to use the breathing machine you got in the hospital (incentive spirometer) which will help keep your temperature down.  It is common for your temperature to cycle up and down following surgery, especially at night when you are not up moving around and exerting yourself.  The breathing machine keeps your lungs expanded and your temperature down. ° ° °DIET:  As you were doing prior to hospitalization, we recommend a well-balanced diet. ° °DRESSING / WOUND CARE / SHOWERING ° °Keep the surgical dressing until follow up.  IF THE DRESSING FALLS OFF or the wound gets wet inside, change the dressing with sterile gauze.  Please use good hand washing techniques before changing the dressing.  Do not use any lotions or creams on the incision until instructed by your surgeon.   ° °ACTIVITY ° °o Increase activity slowly as tolerated, but follow the weight bearing instructions below.   °o No driving for 6 weeks or until further direction given by your physician.  You cannot drive while taking narcotics.  °o No lifting or carrying greater than 10 lbs. until further directed by your surgeon. °o Avoid periods of inactivity such as sitting longer than an hour when not asleep. This helps prevent blood clots.  °o You may return to work once you are authorized by your doctor.  ° ° °WEIGHT BEARING  ° °Weight bearing as tolerated with assist device (walker, cane, etc) as directed, use it as long as suggested by your surgeon or therapist, typically  at least 4-6 weeks. ° ° °CONSTIPATION ° °Constipation is defined medically as fewer than three stools per week and severe constipation as less than one stool per week.  Even if you have a regular bowel pattern at home, your normal regimen is likely to be disrupted due to multiple reasons following surgery.  Combination of anesthesia, postoperative narcotics, change in appetite and fluid intake all can affect your bowels.  ° °YOU MUST use at least one of the following options; they are listed in order of increasing strength to get the job done.  They are all available over the counter, and you may need to use some, POSSIBLY even all of these options:   ° °Drink plenty of fluids (prune juice may be helpful) and high fiber foods °Colace 100 mg by mouth twice a day  °Senokot for constipation as directed and as needed Dulcolax (bisacodyl), take with full glass of water  °Miralax (polyethylene glycol) once or twice a day as needed. ° °If you have tried all these things and are unable to have a bowel movement in the first 3-4 days after surgery call either your surgeon or your primary doctor.   ° °If you experience loose stools or diarrhea, hold the medications until you stool forms back up.  If your symptoms do not get better within 1 week or if they get worse, check with your doctor.  If you experience "the worst   abdominal pain ever" or develop nausea or vomiting, please contact the office immediately for further recommendations for treatment. ° ° °ITCHING:  If you experience itching with your medications, try taking only a single pain pill, or even half a pain pill at a time.  You can also use Benadryl over the counter for itching or also to help with sleep.  ° °TED HOSE STOCKINGS:  Use stockings on both legs until for at least 2 weeks or as directed by physician office. They may be removed at night for sleeping. ° °MEDICATIONS:  See your medication summary on the “After Visit Summary” that nursing will review with you.   You may have some home medications which will be placed on hold until you complete the course of blood thinner medication.  It is important for you to complete the blood thinner medication as prescribed. ° °PRECAUTIONS:  If you experience chest pain or shortness of breath - call 911 immediately for transfer to the hospital emergency department.  ° °If you develop a fever greater that 101 F, purulent drainage from wound, increased redness or drainage from wound, foul odor from the wound/dressing, or calf pain - CONTACT YOUR SURGEON.   °                                                °FOLLOW-UP APPOINTMENTS:  If you do not already have a post-op appointment, please call the office for an appointment to be seen by your surgeon.  Guidelines for how soon to be seen are listed in your “After Visit Summary”, but are typically between 1-4 weeks after surgery. ° °MAKE SURE YOU:  °• Understand these instructions.  °• Get help right away if you are not doing well or get worse.  ° ° °Thank you for letting us be a part of your medical care team.  It is a privilege we respect greatly.  We hope these instructions will help you stay on track for a fast and full recovery!  ° °

## 2015-09-11 NOTE — Progress Notes (Signed)
PT Cancellation Note  Patient Details Name: Candice Willis MRN: [id number] DOB: [birth year]   Cancelled Treatment:    Reason Eval/Treat Not Completed: Patient at procedure or test/unavailable. Pt currently in OR. Will complete PT eval when able.    Candice Willis, Candice Willis 09/11/2015, 7:41 AM   Candice Willis, PT, DPT Acute Rehabilitation Services Pager: [phone]

## 2015-09-11 NOTE — Transfer of Care (Signed)
Immediate Anesthesia Transfer of Care Note  Patient: Candice GrosJoan S Mousel  Procedure(s) Performed: Procedure(s): INTRAMEDULLARY (IM) NAIL INTERTROCHANTRIC (Right)  Patient Location: PACU  Anesthesia Type:General  Level of Consciousness: sedated  Airway & Oxygen Therapy: Patient Spontanous Breathing and Patient connected to face mask oxygen  Post-op Assessment: Report given to RN and Post -op Vital signs reviewed and stable  Post vital signs: Reviewed and stable  Last Vitals:  Filed Vitals:   09/11/15 0507  BP: 154/52  Pulse: 54  Temp: 36.9 C  Resp: 20    Complications: No apparent anesthesia complications

## 2015-09-11 NOTE — Consult Note (Signed)
ORTHOPAEDIC CONSULTATION  REQUESTING PHYSICIAN: Lorretta Harp, MD  Chief Complaint: fall 2/2 vertigo  HPI: Candice Willis is a 81 y.o. female who complains of right hip pain after a fall while trying to hang a picture. 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 Hyperlipidemia   . Hypertension   . Osteoporosis   . Macular degeneration   . Paroxysmal atrial fibrillation (HCC)   . Arteriosclerotic cardiovascular disease   . Palpitations   . Constipation   . Memory deficit   . Ulcer of hard palate   . Pain in right foot   . Thyroid nodule     Status post surgery  . Fracture of multiple pubic rami (HCC) 10/15/14    Right inferior and superior pubic rami  . Degenerative joint disease of right hip 10/15/14   Past Surgical History  Procedure Laterality Date  . Appendectomy    . Abdominal hysterectomy    . Foot surgery      left  . Knee surgery     Social History   Social History  . Marital Status: Single    Spouse Name: N/A  . Number of Children: N/A  . Years of Education: N/A   Social History Main Topics  . Smoking status: Former Smoker -- 0.50 packs/day    Types: Cigarettes    Quit date: 10/05/1971  . Smokeless tobacco: Never Used  . Alcohol Use: Yes     Comment: 1-4 per week  . Drug Use: No  . Sexual Activity: Not Asked   Other Topics Concern  . None   Social History Narrative   Family History  Problem Relation Age of Onset  . Heart disease Father   . Cancer Father     prostate  . Hyperlipidemia Sister   . Cancer Sister     breast  . Heart disease Brother   . Hyperlipidemia Brother   . Cancer Brother     prostate  . Cancer Maternal Grandmother     colon  . Heart disease Paternal Grandmother    Allergies  Allergen Reactions  . Penicillins Other (See Comments)    Unknown    Prior to Admission medications   Medication Sig Start Date End Date Taking? Authorizing Provider  acetaminophen  (TYLENOL) 325 MG tablet Take 650 mg by mouth every 6 (six) hours as needed for moderate pain.   Yes Historical Provider, MD  aspirin 81 MG tablet Take 81 mg by mouth daily.   Yes Historical Provider, MD  Biotin 10 MG CAPS Take 1 tablet by mouth daily.   Yes Historical Provider, MD  docusate sodium (COLACE) 100 MG capsule Take 1 capsule (100 mg total) by mouth daily. Patient taking differently: Take 100 mg by mouth daily as needed for mild constipation.  10/15/14  Yes Linwood Dibbles, MD  Fish Oil-Krill Oil (KRILL OIL PLUS) CAPS Take 1 capsule by mouth daily.   Yes Historical Provider, MD  hydrochlorothiazide (HYDRODIURIL) 12.5 MG tablet Take 12.5 mg by mouth daily. 08/31/15  Yes Historical Provider, MD  HYDROcodone-acetaminophen (NORCO/VICODIN) 5-325 MG per tablet Take 1-2 tablets by mouth every 6 (six) hours as needed. Patient taking differently: Take 1-2 tablets by mouth every 6 (six) hours as needed for severe pain.  10/15/14  Yes Linwood Dibbles, MD  Multiple Vitamin (MULTIVITAMIN WITH MINERALS) TABS Take 1 tablet by mouth daily.   Yes Historical Provider, MD  Doxylamine Succinate, Sleep, (  SLEEP AID PO) Take 1 tablet by mouth at bedtime as needed (for sleep).    Historical Provider, MD  simvastatin (ZOCOR) 40 MG tablet Take 40 mg by mouth daily.    Historical Provider, MD   Dg Chest 1 View  09/10/2015  CLINICAL DATA:  Right hip pain after falling today. History of pelvic fractures. Initial encounter. EXAM: CHEST 1 VIEW COMPARISON:  10/15/2014 radiographs. FINDINGS: 1814 hours. The heart size and mediastinal contours are stable. There is aortic atherosclerosis and mild chronic vascular congestion. The lungs are otherwise clear. No pleural effusion, pneumothorax or acute osseous abnormality identified. IMPRESSION: No acute posttraumatic findings or active cardiopulmonary process identified. Electronically Signed   By: Richardean Sale M.D.   On: 09/10/2015 18:26   Ct Head Wo Contrast  09/10/2015  CLINICAL  DATA:  Lost balance while hanging a picture on the wall, fell landing on RIGHT hip, dizziness, suffered a RIGHT hip fracture, nausea, hypertension, atrial fibrillation, former smoker EXAM: CT HEAD WITHOUT CONTRAST TECHNIQUE: Contiguous axial images were obtained from the base of the skull through the vertex without intravenous contrast. COMPARISON:  10/07/2012 FINDINGS: Generalized atrophy. Normal ventricular morphology. No midline shift or mass effect. Small vessel chronic ischemic changes of deep cerebral white matter. No intracranial hemorrhage, mass lesion, or acute infarction. Visualized paranasal sinuses and mastoid air cells clear. Bones demineralized. Atherosclerotic calcification of internal carotid and vertebral arteries at skullbase. IMPRESSION: Atrophy with small vessel chronic ischemic changes of deep cerebral white matter. No acute intracranial abnormalities. Electronically Signed   By: Lavonia Dana M.D.   On: 09/10/2015 19:24   Dg Hip Unilat  With Pelvis 2-3 Views Right  09/10/2015  CLINICAL DATA:  Lost balance and fell while hanging a picture on wall, landing on RIGHT hip, felt a pop, RIGHT hip pain, increased pain with movement and palpation, initial encounter EXAM: DG HIP (WITH OR WITHOUT PELVIS) 2-3V RIGHT COMPARISON:  10/15/2014 FINDINGS: Osseous demineralization. Nondisplaced intertrochanteric fracture RIGHT femur with a nondisplaced fracture plane extending subtrochanteric. No dislocation. Narrowing of the hip joints bilaterally. Old healed pubic rami fractures on RIGHT. Remaining pelvis appears intact. IMPRESSION: Osseous demineralization with nondisplaced intertrochanteric fracture RIGHT femur extending subtrochanteric. Old RIGHT pubic rami fractures. Electronically Signed   By: Lavonia Dana M.D.   On: 09/10/2015 18:25    Positive ROS: All other systems have been reviewed and were otherwise negative with the exception of those mentioned in the HPI and as above.  Labs cbc  Recent  Labs  09/10/15 1835  WBC 4.5  HGB 13.0  HCT 37.0  PLT 223    Labs inflam No results for input(s): CRP in the last 72 hours.  Invalid input(s): ESR  Labs coag  Recent Labs  09/10/15 2345  INR 1.05     Recent Labs  09/10/15 1835  NA 134*  K 3.5  CL 102  CO2 23  GLUCOSE 151*  BUN 13  CREATININE 0.70  CALCIUM 9.1    Physical Exam: Filed Vitals:   09/11/15 0507  BP: 154/52  Pulse: 54  Temp: 98.4 F (36.9 C)  Resp: 20   General: Alert, no acute distress Cardiovascular: No pedal edema Respiratory: No cyanosis, no use of accessory musculature GI: No organomegaly, abdomen is soft and non-tender Skin: No lesions in the area of chief complaint other than those listed below in MSK exam.  Neurologic: Sensation intact distally Psychiatric: Patient is competent for consent with normal mood and affect Lymphatic: No axillary or cervical lymphadenopathy  MUSCULOSKELETAL:  RLE: Compartments are soft she is neurovascularly intact she has pain with any hip motion. Skin is benign. Other extremities are atraumatic with painless ROM and NVI.  Assessment: Right hip intertroch fracture  Plan: IM nail in OR Weight Bearing Status: WBAT post op PT VTE px: SCD's and ASA post op   Renette Butters, MD Cell [phone]   09/11/2015 5:27 AM

## 2015-09-11 NOTE — Progress Notes (Signed)
Dr Katrinka Blazingsmith at bedside pt remains in bigeminy vss stable dr Katrinka Blazingsmith statwes ok to return pt to 5n05

## 2015-09-11 NOTE — Clinical Social Work Note (Addendum)
CSW received consult for patient needing SNF placement.  [facility], [facility] who said that patient will have a bed available on Monday pending PT recommendations and insurance authorization.  CSW to continue to follow patient's progress, formal assessment to follow, FL2 on chart to be signed by physician.  Ervin KnackEric R. Tashima Scarpulla, MSW, Theresia MajorsLCSWA [phone] 09/11/2015 6:40 PM

## 2015-09-11 NOTE — Progress Notes (Signed)
Dr  Katrinka BlazingSmith at bedside orders obtained and carried out

## 2015-09-12 LAB — TYPE AND SCREEN
ABO/RH(D): A POS
ANTIBODY SCREEN: NEGATIVE

## 2015-09-12 NOTE — Progress Notes (Signed)
OT Cancellation Note  Patient Details Name: Candice Willis MRN: [id number] DOB: 13-Aug-1924   Cancelled Treatment:    Reason Eval/Treat Not Completed:  (Screen) Per chart, current D/C plan is SNF. No apparent immediate acute care OT needs, therefore will defer OT to SNF. If OT eval is needed please call Acute Rehab Dept. at [phone] or text page OT at [phone].    Earlie RavelingStraub, [name] L OTR/L [phone] 09/12/2015, 8:06 AM

## 2015-09-12 NOTE — Care Management Note (Addendum)
Case Management Note  Patient Details  Name: Vincent GrosJoan S Splitt MRN: [id number] Date of Birth: Mar 02, 1924  Subjective/Objective:            Right hip pain after fall and now is s/p INTRAMEDULLARY (IM) NAIL INTERTROCHANTRIC (Right)  Action/Plan: CM notes that CSW consulted for SNF placement. [facility]. CM remains available for any further discharge planning needs.   Expected Discharge Date:   (unknown)               Expected Discharge Plan:  Skilled Nursing Facility  In-House Referral:  Clinical Social Work  Discharge planning Services  CM Consult  Post Acute Care Choice:    Choice offered to:     DME Arranged:    DME Agency:     HH Arranged:    HH Agency:     Status of Service:  In process, will continue to follow  Medicare Important Message Given:    Date Medicare IM Given:    Medicare IM give by:    Date Additional Medicare IM Given:    Additional Medicare Important Message give by:     If discussed at Long Length of Stay Meetings, dates discussed:    Additional Comments:  Darcel SmallingAnna C Tyri Elmore, RN 09/12/2015, 3:29 PM

## 2015-09-12 NOTE — Progress Notes (Signed)
Subjective: 1 Day Post-Op Procedure(s) (LRB): INTRAMEDULLARY (IM) NAIL INTERTROCHANTRIC (Right) Patient reports pain as mild.  Patient reported lightheadedness yesterday when sitting up.  This has resolved.  No nausea/vomiting, chest pain/sob.  Negative flatus/bm.  Decreased appetite.  Objective: Vital signs in last 24 hours: Temp:  [97.7 F (36.5 C)-99.1 F (37.3 C)] 99.1 F (37.3 C) (10/22 0532) Pulse Rate:  [57-88] 88 (10/22 0532) Resp:  [16-18] 16 (10/22 0532) BP: (103-134)/(48-54) 134/51 mmHg (10/22 0532) SpO2:  [96 %-100 %] 96 % (10/22 0532)  Intake/Output from previous day: 10/21 0701 - 10/22 0700 In: 3079 [I.V.:3079] Out: 400 [Urine:350; Blood:50] Intake/Output this shift: Total I/O In: 480 [P.O.:480] Out: -    Recent Labs  09/10/15 1835  HGB 13.0    Recent Labs  09/10/15 1835  WBC 4.5  RBC 4.17  HCT 37.0  PLT 223    Recent Labs  09/10/15 1835  NA 134*  K 3.5  CL 102  CO2 23  BUN 13  CREATININE 0.70  GLUCOSE 151*  CALCIUM 9.1    Recent Labs  09/10/15 2345  INR 1.05    Neurologically intact Neurovascular intact Sensation intact distally Intact pulses distally Dorsiflexion/Plantar flexion intact Compartment soft  Negative homans bilaterally  Assessment/Plan: 1 Day Post-Op Procedure(s) (LRB): INTRAMEDULLARY (IM) NAIL INTERTROCHANTRIC (Right) Advance diet Up with therapy  WBAT RLE Dry dressing change prn Likely d/c to SNF tomorrow but this will be up to medicine.  Ok from ortho standpoint Continue plan per medicine  Otilio SaberM Lindsey Laraya Pestka 09/12/2015, 10:21 AM

## 2015-09-12 NOTE — Progress Notes (Signed)
[facility] PROGRESS NOTE  Candice Willis DOB: [birth year] DOA: 09/10/2015 PCP: Candice Willis  Assessment/Plan:  Principal Problem:   Hip fracture, right (HCC) s/p IM nail 10/21 by Dr. Wandra Feinstein. [name] Up with PT. ASA for DVT prophylaxis per ortho Active Problems:   Hyperlipidemia   Essential hypertension   Osteoporosis   Memory deficit   Thyroid nodule   Fall   Vertigo: improved   Diarrhea: none further   PAF (paroxysmal atrial fibrillation) (HCC)  Chadvasc 4. Rate controlled. Not on anticoag   Code Status:  full Family Communication:   Friend at bedside 10/21 Disposition Plan:  Likely SNF tomorrow if stable  Consultants:  ortho  Procedures:   IM nail, right  Antibiotics:    HPI/Subjective: Pain controlled. Dizziness improved  Objective: Filed Vitals:   09/12/15 0532  BP: 134/51  Pulse: 88  Temp: 99.1 F (37.3 C)  Resp: 16    Intake/Output Summary (Last 24 hours) at 09/12/15 1346 Last data filed at 09/12/15 1242  Gross per 24 hour  Intake   3099 ml  Output    350 ml  Net   2749 ml   There were no vitals filed for this visit.  Exam:   General:   Asleep. Arousable. Comfortable  Cardiovascular:  Irregularly irregular without murmurs gallops rubs  Respiratory:  Clear to auscultation bilaterally without wheezes rhonchi or rales  Abdomen:  Soft nontender nondistended  Ext:  No edema. Dressings in place.   Neuro: Nonfocal  Basic Metabolic Panel:  Recent Labs Lab 09/10/15 1835  NA 134*  K 3.5  CL 102  CO2 23  GLUCOSE 151*  BUN 13  CREATININE 0.70  CALCIUM 9.1   Liver Function Tests: No results for input(s): AST, ALT, ALKPHOS, BILITOT, PROT, ALBUMIN in the last 168 hours. No results for input(s): LIPASE, AMYLASE in the last 168 hours. No results for input(s): AMMONIA in the last 168 hours. CBC:  Recent Labs Lab 09/10/15 1835  WBC 4.5  NEUTROABS 3.2  HGB 13.0  HCT 37.0  MCV 88.7  PLT 223   Cardiac  Enzymes: No results for input(s): CKTOTAL, CKMB, CKMBINDEX, TROPONINI in the last 168 hours. BNP (last 3 results) No results for input(s): BNP in the last 8760 hours.  ProBNP (last 3 results) No results for input(s): PROBNP in the last 8760 hours.  CBG:  Recent Labs Lab 09/11/15 0454  GLUCAP 126*    Recent Results (from the past 240 hour(s))  Surgical pcr screen     Status: Abnormal   Collection Time: 09/11/15  4:39 AM  Result Value Ref Range Status   MRSA, PCR NEGATIVE NEGATIVE Final   Staphylococcus aureus POSITIVE (A) NEGATIVE Final    Comment:        The Xpert SA Assay (FDA approved for NASAL specimens in patients over 79 years of age), is one component of a comprehensive surveillance program.  Test performance has been validated by [facility] for patients greater than or equal to 10917 year old. It is not intended to diagnose infection nor to guide or monitor treatment.      Studies: Dg Chest 1 View  09/10/2015  CLINICAL DATA:  Right hip pain after falling today. History of pelvic fractures. Initial encounter. EXAM: CHEST 1 VIEW COMPARISON:  10/15/2014 radiographs. FINDINGS: 1814 hours. The heart size and mediastinal contours are stable. There is aortic atherosclerosis and mild chronic vascular congestion. The lungs are otherwise clear. No pleural effusion, pneumothorax or acute osseous  abnormality identified. IMPRESSION: No acute posttraumatic findings or active cardiopulmonary process identified. Electronically Signed   By: Carey Bullocks M.D.   On: 09/10/2015 18:26   Ct Head Wo Contrast  09/10/2015  CLINICAL DATA:  Lost balance while hanging a picture on the wall, fell landing on RIGHT hip, dizziness, suffered a RIGHT hip fracture, nausea, hypertension, atrial fibrillation, former smoker EXAM: CT HEAD WITHOUT CONTRAST TECHNIQUE: Contiguous axial images were obtained from the base of the skull through the vertex without intravenous contrast. COMPARISON:  10/07/2012  FINDINGS: Generalized atrophy. Normal ventricular morphology. No midline shift or mass effect. Small vessel chronic ischemic changes of deep cerebral white matter. No intracranial hemorrhage, mass lesion, or acute infarction. Visualized paranasal sinuses and mastoid air cells clear. Bones demineralized. Atherosclerotic calcification of internal carotid and vertebral arteries at skullbase. IMPRESSION: Atrophy with small vessel chronic ischemic changes of deep cerebral white matter. No acute intracranial abnormalities. Electronically Signed   By: Ulyses Southward M.D.   On: 09/10/2015 19:24   Pelvis Portable  09/11/2015  CLINICAL DATA:  Postop from internal fixation of right hip fracture. EXAM: PORTABLE PELVIS 1-2 VIEWS COMPARISON:  09/10/2015 FINDINGS: Screw and intramedullary rod fixation of intertrochanteric and subtrochanteric right hip fracture is now seen, which is in near anatomic alignment. No evidence of dislocation. Old fracture deformities of the right superior and inferior pubic rami again noted. IMPRESSION: Internal fixation of right hip fracture in near anatomic alignment. Electronically Signed   By: Myles Rosenthal M.D.   On: 09/11/2015 08:53   Dg C-arm 1-60 Min  09/11/2015  CLINICAL DATA:  81 year old female undergoing repair of intertrochanteric fracture EXAM: RIGHT FEMUR 2 VIEWS; DG C-ARM 61-120 MIN COMPARISON:  Preoperative radiographs 09/10/2015 FINDINGS: Interval operative repair of intertrochanteric fracture with intra medullary nail (single distal interlocking screw) and a transfemoral neck lag screw. There is improvement in alignment of the fracture fragments compared to the preoperative radiographs. No evidence of immediate hardware complication. IMPRESSION: ORIF of right intertrochanteric fracture as above. Electronically Signed   By: Malachy Moan M.D.   On: 09/11/2015 08:06   Dg Hip Unilat  With Pelvis 2-3 Views Right  09/10/2015  CLINICAL DATA:  Lost balance and fell while hanging a  picture on wall, landing on RIGHT hip, felt a pop, RIGHT hip pain, increased pain with movement and palpation, initial encounter EXAM: DG HIP (WITH OR WITHOUT PELVIS) 2-3V RIGHT COMPARISON:  10/15/2014 FINDINGS: Osseous demineralization. Nondisplaced intertrochanteric fracture RIGHT femur with a nondisplaced fracture plane extending subtrochanteric. No dislocation. Narrowing of the hip joints bilaterally. Old healed pubic rami fractures on RIGHT. Remaining pelvis appears intact. IMPRESSION: Osseous demineralization with nondisplaced intertrochanteric fracture RIGHT femur extending subtrochanteric. Old RIGHT pubic rami fractures. Electronically Signed   By: Ulyses Southward M.D.   On: 09/10/2015 18:25   Dg Femur, Min 2 Views Right  09/11/2015  CLINICAL DATA:  81 year old female undergoing repair of intertrochanteric fracture EXAM: RIGHT FEMUR 2 VIEWS; DG C-ARM 61-120 MIN COMPARISON:  Preoperative radiographs 09/10/2015 FINDINGS: Interval operative repair of intertrochanteric fracture with intra medullary nail (single distal interlocking screw) and a transfemoral neck lag screw. There is improvement in alignment of the fracture fragments compared to the preoperative radiographs. No evidence of immediate hardware complication. IMPRESSION: ORIF of right intertrochanteric fracture as above. Electronically Signed   By: Malachy Moan M.D.   On: 09/11/2015 08:06    Scheduled Meds: . aspirin EC  325 mg Oral Q breakfast  . multivitamin with minerals  1 tablet  Oral Daily  . omega-3 acid ethyl esters  1 g Oral Daily  . simvastatin  40 mg Oral Daily  . sodium chloride  3 mL Intravenous Q12H   Continuous Infusions: . sodium chloride 100 mL/hr at 09/11/15 2157    Time spent: 25 minutes  Rylin Seavey L  [facility] [email or web address], password [facility] 09/12/2015, 1:46 PM  LOS: 2 days

## 2015-09-12 NOTE — Progress Notes (Signed)
Physical Therapy Evaluation Patient Details Name: Candice Willis MRN: [id number] DOB: 1923-11-23 Today's Date: 09/12/2015   History of Present Illness  81 yo female post mechanical fall with Rt hip fracture and IM nail completed 09/11/15.  Clinical Impression  Patient seen with friend, Fannie Knee, present.  MOD assist overall for basic mobility in bed and to stand at bedside, able to take a few steps, but with rapid fatigue and feeling unsteady.  Patient with complaints of dizziness at times with movement, reports history of dizziness including prior treatment where they "moved her around in certain positions" several years ago (per Fannie Knee).  Unable to completely assess for dizziness, symptoms noted during today's evaluation likely include Vestibular component, also with likely orthostatic piece today.   Vestibular evaluation pending.  Patient is appropriate for skilled PT services and anticipated to recover rapidly as pain decreases.      Follow Up Recommendations SNF (Patient reports services available at the [facility])    Equipment Recommendations  None recommended by PT (Will continue to assess)    Recommendations for Other Services   Vestibular evaluation pending.    Precautions / Restrictions Precautions Precautions: Fall Restrictions Weight Bearing Restrictions: Yes RLE Weight Bearing: Weight bearing as tolerated Other Position/Activity Restrictions: Monitor for dizziness, vestibular eval pending      Mobility  Bed Mobility Overal bed mobility: Needs Assistance Bed Mobility: Supine to Sit     Supine to sit: Mod assist     General bed mobility comments: Plus increased time and cues  Transfers Overall transfer level: Needs assistance Equipment used: Rolling walker (2 wheeled) Transfers: Sit to/from Stand Sit to Stand: Mod assist         General transfer comment: Antalgic, cues for hand placement  Ambulation/Gait Ambulation/Gait assistance: Min assist;Mod  assist Ambulation Distance (Feet): 3 Feet Assistive device: Rolling walker (2 wheeled) Gait Pattern/deviations: Antalgic;Step-to pattern     General Gait Details: Feels unsteady, 3 steps forward/back  Stairs            Wheelchair Mobility    Modified Rankin (Stroke Patients Only)       Balance Overall balance assessment: Needs assistance Sitting-balance support: Single extremity supported Sitting balance-Leahy Scale: Fair     Standing balance support: Bilateral upper extremity supported Standing balance-Leahy Scale: Poor                               Pertinent Vitals/Pain Pain Assessment: 0-10 Pain Score: 5  Pain Location: Rt hip Pain Descriptors / Indicators: Aching;Sore Pain Intervention(s): Monitored during session;Limited activity within patient's tolerance;Premedicated before session    Home Living Family/patient expects to be discharged to:: Other (Comment) ([facility]) Living Arrangements: Alone                    Prior Function Level of Independence: Independent         Comments: very active, pool exercises 3x/wk     Hand Dominance        Extremity/Trunk Assessment   Upper Extremity Assessment: Overall WFL for tasks assessed           Lower Extremity Assessment: RLE deficits/detail RLE Deficits / Details: Pain, weakness       Communication   Communication: No difficulties  Cognition Arousal/Alertness: Awake/alert Behavior During Therapy: WFL for tasks assessed/performed Overall Cognitive Status: Within Functional Limits for tasks assessed  General Comments      Exercises General Exercises - Lower Extremity Ankle Circles/Pumps: Both;10 reps;AROM Quad Sets: Right;5 reps;AROM Heel Slides: AAROM;Right;10 reps Hip ABduction/ADduction: AAROM;Right;10 reps Straight Leg Raises: AAROM;Right;10 reps      Assessment/Plan    PT Assessment Patient needs continued PT services  PT  Diagnosis Difficulty walking;Abnormality of gait;Generalized weakness;Acute pain   PT Problem List Decreased strength;Decreased activity tolerance;Decreased balance;Decreased mobility;Pain  PT Treatment Interventions Gait training;Functional mobility training;Therapeutic activities;Therapeutic exercise;Balance training;Patient/family education   PT Goals (Current goals can be found in the Care Plan section) Acute Rehab PT Goals Patient Stated Goal: To get moving again PT Goal Formulation: With patient Time For Goal Achievement: 09/26/15 Potential to Achieve Goals: Good    Frequency Min 3X/week   Barriers to discharge        Co-evaluation               End of Session Equipment Utilized During Treatment: Gait belt;Oxygen Activity Tolerance: Patient tolerated treatment well;No increased pain;Patient limited by fatigue Patient left: in bed;with call bell/phone within reach;with family/visitor present;with SCD's reapplied Nurse Communication: Mobility status         Time: [id number] PT Time Calculation (min) (ACUTE ONLY): 50 min   Charges:   PT Evaluation $Initial PT Evaluation Tier I: 1 Procedure PT Treatments $Therapeutic Exercise: 8-22 mins $Therapeutic Activity: 8-22 mins   PT G CodesFreida Busman:        Arria Naim L 09/12/2015, 12:48 PM

## 2015-09-13 DIAGNOSIS — D62 Acute posthemorrhagic anemia: Secondary | ICD-10-CM | POA: Diagnosis not present

## 2015-09-13 DIAGNOSIS — E871 Hypo-osmolality and hyponatremia: Secondary | ICD-10-CM

## 2015-09-13 LAB — CBC
HCT: 23.2 % — ABNORMAL LOW (ref 36.0–46.0)
Hemoglobin: 8.1 g/dL — ABNORMAL LOW (ref 12.0–15.0)
MCH: 30.9 pg (ref 26.0–34.0)
MCHC: 34.9 g/dL (ref 30.0–36.0)
MCV: 88.5 fL (ref 78.0–100.0)
PLATELETS: 163 10*3/uL (ref 150–400)
RBC: 2.62 MIL/uL — ABNORMAL LOW (ref 3.87–5.11)
RDW: 13.4 % (ref 11.5–15.5)
WBC: 12 10*3/uL — AB (ref 4.0–10.5)

## 2015-09-13 LAB — BASIC METABOLIC PANEL
Anion gap: 5 (ref 5–15)
BUN: 14 mg/dL (ref 6–20)
CALCIUM: 8.2 mg/dL — AB (ref 8.9–10.3)
CO2: 25 mmol/L (ref 22–32)
Chloride: 99 mmol/L — ABNORMAL LOW (ref 101–111)
Creatinine, Ser: 0.67 mg/dL (ref 0.44–1.00)
GFR calc Af Amer: >60 mL/min (ref >60)
GLUCOSE: 118 mg/dL — AB (ref 65–99)
Potassium: 3.8 mmol/L (ref 3.5–5.1)
Sodium: 129 mmol/L — ABNORMAL LOW (ref 135–145)

## 2015-09-13 LAB — GLUCOSE, CAPILLARY: Glucose-Capillary: 110 mg/dL — ABNORMAL HIGH (ref 65–99)

## 2015-09-13 NOTE — Progress Notes (Signed)
Subjective: 2 Days Post-Op Procedure(s) (LRB): INTRAMEDULLARY (IM) NAIL INTERTROCHANTRIC (Right) Patient reports pain as mild.  Patient with increased pain in lower back and was unable to sleep much at all last night. Patient believes this is due to known arthritis in her back/pelvis.  She is still c/o lightheadedness/dizziness this am.  No nausea/vomiting, chest pain/sob.  Decreased appetite.  Objective: Vital signs in last 24 hours: Temp:  [97.3 F (36.3 C)-98.5 F (36.9 C)] 97.3 F (36.3 C) (10/23 0533) Pulse Rate:  [61-94] 69 (10/23 0533) Resp:  [16-18] 16 (10/23 0533) BP: (111-126)/(39-81) 117/39 mmHg (10/23 0533) SpO2:  [97 %-100 %] 97 % (10/23 0533)  Intake/Output from previous day: 10/22 0701 - 10/23 0700 In: 960 [P.O.:960] Out: -  Intake/Output this shift:     Recent Labs  09/10/15 1835 09/13/15 0510  HGB 13.0 8.1*    Recent Labs  09/10/15 1835 09/13/15 0510  WBC 4.5 12.0*  RBC 4.17 2.62*  HCT 37.0 23.2*  PLT 223 163    Recent Labs  09/10/15 1835 09/13/15 0510  NA 134* 129*  K 3.5 3.8  CL 102 99*  CO2 23 25  BUN 13 14  CREATININE 0.70 0.67  GLUCOSE 151* 118*  CALCIUM 9.1 8.2*    Recent Labs  09/10/15 2345  INR 1.05    Neurologically intact Neurovascular intact Sensation intact distally Intact pulses distally Dorsiflexion/Plantar flexion intact Compartment soft  No drainage noted through dressing Negative homans bilaterally  Assessment/Plan: 2 Days Post-Op Procedure(s) (LRB): INTRAMEDULLARY (IM) NAIL INTERTROCHANTRIC (Right) Advance diet Up with therapy  WBAT RLE Dry dressing change prn ABLA-mild and stable Spoke with Dr. Lendell CapriceSullivan (hospitalist), who would like to keep patient until tomorrow to monitor Hb.  I agree with this plan   Otilio SaberM Lindsey Stanbery 09/13/2015, 9:08 AM

## 2015-09-13 NOTE — Progress Notes (Signed)
[facility] PROGRESS NOTE  Candice GrosJoan S Willis QMV:[id number] DOB: 1924-03-11 DOA: 09/10/2015 PCP: Paulino RilyJONES,ENRICO G, MD  Summary 81 year old female with h/o vertigo got dizzy while hanging a picture, fell and broke her right hip.  Assessment/Plan:  Principal Problem:   Hip fracture, right (HCC) s/p IM nail 10/21 by Dr. Wandra Feinstein. [name] Up with PT. ASA for DVT prophylaxis per ortho Active Problems: Anemia: With hydration, hemoglobin has dropped 5 points. 8.0 today. Mild bruising around the operative site. No reports of overt bleeding. Will hold off on discharge and recheck hemoglobin in the morning Hyponatremia: Sodium was 134 on admission. 129 today. Will recheck tomorrow. Appears euvolemic. Will stop IV fluids.   Hyperlipidemia   Essential hypertension   Osteoporosis   Memory deficit   Thyroid nodule   Fall   Vertigo: improved   Diarrhea: none further   PAF (paroxysmal atrial fibrillation) (HCC)  Chadvasc 4. Rate controlled. Not on anticoag as an outpatient, question whether the decision was made due to fall risk. It would be reasonable to continue aspirin long-term in this patient.   Code Status:  full Family Communication:   Friend at bedside 10/21 Disposition Plan:  Likely SNF tomorrow if labs  Consultants:  ortho  Procedures:   IM nail, right  Antibiotics:    HPI/Subjective: Pain controlled. No bleeding. No dizziness.   Objective: Filed Vitals:   09/13/15 0533  BP: 117/39  Pulse: 69  Temp: 97.3 F (36.3 C)  Resp: 16    Intake/Output Summary (Last 24 hours) at 09/13/15 [id number] Last data filed at 09/12/15 1700  Gross per 24 hour  Intake    480 ml  Output      0 ml  Net    480 ml   There were no vitals filed for this visit.  Exam:   General:   Asleep. Arousable. Comfortable  Cardiovascular:  Irregularly irregular without murmurs gallops rubs  Respiratory:  Clear to auscultation bilaterally without wheezes rhonchi or rales  Abdomen:  Soft nontender  nondistended  Ext:  No edema. Dressings in place.   Neuro: Nonfocal  Basic Metabolic Panel:  Recent Labs Lab 09/10/15 1835 09/13/15 0510  NA 134* 129*  K 3.5 3.8  CL 102 99*  CO2 23 25  GLUCOSE 151* 118*  BUN 13 14  CREATININE 0.70 0.67  CALCIUM 9.1 8.2*   Liver Function Tests: No results for input(s): AST, ALT, ALKPHOS, BILITOT, PROT, ALBUMIN in the last 168 hours. No results for input(s): LIPASE, AMYLASE in the last 168 hours. No results for input(s): AMMONIA in the last 168 hours. CBC:  Recent Labs Lab 09/10/15 1835 09/13/15 0510  WBC 4.5 12.0*  NEUTROABS 3.2  --   HGB 13.0 8.1*  HCT 37.0 23.2*  MCV 88.7 88.5  PLT 223 163   Cardiac Enzymes: No results for input(s): CKTOTAL, CKMB, CKMBINDEX, TROPONINI in the last 168 hours. BNP (last 3 results) No results for input(s): BNP in the last 8760 hours.  ProBNP (last 3 results) No results for input(s): PROBNP in the last 8760 hours.  CBG:  Recent Labs Lab 09/11/15 0454 09/13/15 0727  GLUCAP 126* 110*    Recent Results (from the past 240 hour(s))  Surgical pcr screen     Status: Abnormal   Collection Time: 09/11/15  4:39 AM  Result Value Ref Range Status   MRSA, PCR NEGATIVE NEGATIVE Final   Staphylococcus aureus POSITIVE (A) NEGATIVE Final    Comment:  The Xpert SA Assay (FDA approved for NASAL specimens in patients over 19 years of age), is one component of a comprehensive surveillance program.  Test performance has been validated by [facility] for patients greater than or equal to 64 year old. It is not intended to diagnose infection nor to guide or monitor treatment.      Studies: No results found.  Scheduled Meds: . aspirin EC  325 mg Oral Q breakfast  . multivitamin with minerals  1 tablet Oral Daily  . omega-3 acid ethyl esters  1 g Oral Daily  . simvastatin  40 mg Oral Daily  . sodium chloride  3 mL Intravenous Q12H   Continuous Infusions:    Time spent: 25  minutes  Amoni Scallan L  [facility] [email or web address], password [facility] 09/13/2015, 9:21 AM  LOS: 3 days

## 2015-09-14 ENCOUNTER — Encounter (HOSPITAL_COMMUNITY): Payer: Self-pay | Admitting: Orthopedic Surgery

## 2015-09-14 DIAGNOSIS — D62 Acute posthemorrhagic anemia: Secondary | ICD-10-CM

## 2015-09-14 DIAGNOSIS — I10 Essential (primary) hypertension: Secondary | ICD-10-CM

## 2015-09-14 DIAGNOSIS — I480 Paroxysmal atrial fibrillation: Secondary | ICD-10-CM

## 2015-09-14 LAB — BASIC METABOLIC PANEL
Anion gap: 6 (ref 5–15)
BUN: 15 mg/dL (ref 6–20)
CALCIUM: 8.1 mg/dL — AB (ref 8.9–10.3)
CO2: 26 mmol/L (ref 22–32)
CREATININE: 0.59 mg/dL (ref 0.44–1.00)
Chloride: 97 mmol/L — ABNORMAL LOW (ref 101–111)
GFR calc Af Amer: >60 mL/min (ref >60)
Glucose, Bld: 112 mg/dL — ABNORMAL HIGH (ref 65–99)
POTASSIUM: 3.8 mmol/L (ref 3.5–5.1)
SODIUM: 129 mmol/L — AB (ref 135–145)

## 2015-09-14 LAB — CBC
HCT: 22.2 % — ABNORMAL LOW (ref 36.0–46.0)
Hemoglobin: 7.8 g/dL — ABNORMAL LOW (ref 12.0–15.0)
MCH: 31.1 pg (ref 26.0–34.0)
MCHC: 35.1 g/dL (ref 30.0–36.0)
MCV: 88.4 fL (ref 78.0–100.0)
PLATELETS: 180 10*3/uL (ref 150–400)
RBC: 2.51 MIL/uL — AB (ref 3.87–5.11)
RDW: 13.4 % (ref 11.5–15.5)
WBC: 9.3 10*3/uL (ref 4.0–10.5)

## 2015-09-14 MED ORDER — FUROSEMIDE 20 MG PO TABS
20.0000 mg | ORAL_TABLET | Freq: Once | ORAL | Status: AC
  Administered 2015-09-14: 20 mg via ORAL
  Filled 2015-09-14: qty 1

## 2015-09-14 MED ORDER — FERROUS GLUCONATE 324 (38 FE) MG PO TABS: 324.0000 mg | ORAL_TABLET | Freq: Every day | ORAL | Status: DC

## 2015-09-14 MED ORDER — MECLIZINE HCL 12.5 MG PO TABS: 12.5000 mg | ORAL_TABLET | Freq: Three times a day (TID) | ORAL | Status: DC | PRN

## 2015-09-14 NOTE — Clinical Social Work Note (Signed)
[facility]  Patient and family agreeable to plans will transport via ems RN to call report.  Windell MouldingEric Kyreese Chio, MSW, Theresia MajorsLCSWA [phone]

## 2015-09-14 NOTE — Progress Notes (Signed)
Called to give report to danielle at [facility]

## 2015-09-14 NOTE — Progress Notes (Signed)
Called [facility] to let them know they need to wake pt if sleeping to let her know her meals are there other wise they become cold if she is sleeping

## 2015-09-14 NOTE — Progress Notes (Signed)
     Subjective:  POD#3 Im nail of the R hip. Patient reports pain as moderate.  Resting comfortably in bed this morning.  Dizzy/ lightheaded yesterday.  Repeat CBC still pending.  PT recommending SNF at discharge. Patient resident of [facility].   Objective:   VITALS:   Filed Vitals:   09/13/15 0533 09/13/15 0959 09/13/15 2014 09/14/15 0600  BP: 117/39  103/47 116/48  Pulse: 69  79 97  Temp: 97.3 F (36.3 C)  98 F (36.7 C) 98.9 F (37.2 C)  TempSrc: Oral  Oral Oral  Resp: 16  18 18   Height:  5\' 3"  (1.6 m)    Weight:  53.524 kg (118 lb)    SpO2: 97%  100% 98%    Neurologically intact ABD soft Neurovascular intact Sensation intact distally Intact pulses distally Dorsiflexion/Plantar flexion intact Incision: dressing C/D/I   Lab Results  Component Value Date   WBC 12.0* 09/13/2015   HGB 8.1* 09/13/2015   HCT 23.2* 09/13/2015   MCV 88.5 09/13/2015   PLT 163 09/13/2015   BMET    Component Value Date/Time   NA 129* 09/13/2015 0510   NA 133* 10/21/2014   K 3.8 09/13/2015 0510   CL 99* 09/13/2015 0510   CO2 25 09/13/2015 0510   GLUCOSE 118* 09/13/2015 0510   BUN 14 09/13/2015 0510   BUN 16 10/21/2014   CREATININE 0.67 09/13/2015 0510   CREATININE 0.6 10/21/2014   CALCIUM 8.2* 09/13/2015 0510   GFRNONAA >60 09/13/2015 0510   GFRAA >60 09/13/2015 0510     Assessment/Plan: 3 Days Post-Op   Principal Problem:   Hip fracture, right (HCC) Active Problems:   Hyperlipidemia   Essential hypertension   Osteoporosis   Memory deficit   Thyroid nodule   Fall   Dizziness   Closed right hip fracture (HCC)   Hip fracture (HCC)   Diarrhea   PAF (paroxysmal atrial fibrillation) (HCC)   Postoperative anemia due to acute blood loss   Hyponatremia   Up with therapy WBAT in the RLE ASA 325mg  daily for DVT prophylaxis Ok for discharge to facility from ortho standpoint.    Candice Willis Candice Willis 09/14/2015, 6:52 AM Cell [phone]

## 2015-09-14 NOTE — Progress Notes (Signed)
Called Dr Benjamine MolaVann and [place], Dr Orie FishermanMurphey PA regarding Candice Willis Hemoglobin of 7.8, Dr Benjamine MolaVann states she is not symptomatic so we can Discharge

## 2015-09-14 NOTE — Discharge Summary (Signed)
Physician Discharge Summary  Candice Willis WUJ:[id number] DOB: 1924-02-10 DOA: 09/10/2015  PCP: Paulino Rily, MD  Admit date: 09/10/2015 Discharge date: 09/14/2015  Time spent: 35 minutes  Recommendations for Outpatient Follow-up:  1. SNF 2. CBC 1 week  Discharge Diagnoses:  Principal Problem:   Hip fracture, right (HCC) Active Problems:   Hyperlipidemia   Essential hypertension   Osteoporosis   Memory deficit   Thyroid nodule   Fall   Dizziness   Closed right hip fracture (HCC)   Hip fracture (HCC)   Diarrhea   PAF (paroxysmal atrial fibrillation) (HCC)   Postoperative anemia due to acute blood loss   Hyponatremia   Discharge Condition: improved  Diet recommendation: regular  Filed Weights   09/13/15 0959  Weight: 53.524 kg (118 lb)    History of present illness:  Candice Willis is a 81 y.o. female with PMH of hypertension, hyperlipidemia, open, PAF (not on anticoagulant possibly due to high-risk of fall due to dizziness), CAD, dizziness, who presents with a right hip pain after fall.  Patient reports that she had diarrhea for several days, which has resolved since yesterday. She had nausea, but no abdominal pain. She developed dizziness, particularly when she rolls over on the bed. She states that he had fall and injured her right hip when she tried hanging up a picture at about 4:30 PM. She developed moderate pain over right hip and muscle spasm in the right leg, but no weakness or numbness in right leg. She does not have chest pain, shortness of breath, cough, unilateral weakness, numbness or tingling sensations in her extremities. She states that she had a history of dizziness which had resolved long time ago.   In ED, patient was found to have nondisplaced intertrochanteric right femoral fracture and old right pubic rami fracture by x-ray. WBC 4.5, temperature normal, no tachycardia, electrolytes okay, negative chest x-ray, negative CT-head for acute  abnormalities. Patient is admitted to inpatient for further evaluation and treatment. Orthopedics was consulted by ED.   Hospital Course:  Hip fracture, right (HCC) s/p IM nail 10/21 by Dr. Wandra Feinstein. Up with PT. ASA for DVT prophylaxis per ortho  Anemia: With hydration hemoglobin has dropped- positive 4.7L : No reports of overt bleeding. Start iron-- patient denies SOB, denies lightheadedness  Hyponatremia: Sodium was 134 on admission. 129 and stable. Appears euvolemic  Hyperlipidemia  Essential hypertension  Osteoporosis  Memory deficit  Thyroid nodule  Fall  Vertigo: improved  Diarrhea: none further  PAF (paroxysmal atrial fibrillation) (HCC) Chadvasc 4. Rate controlled. Not on anticoag as an outpatient, question whether the decision was made due to fall risk. It would be reasonable to continue aspirin long-term in this patient.  Procedures: INTRAMEDULLARY (IM) NAIL INTERTROCHANTRIC  Consultations:  ortho  Discharge Exam: Filed Vitals:   09/14/15 0600  BP: 116/48  Pulse: 97  Temp: 98.9 F (37.2 C)  Resp: 18    General: awake, hard of hearing Cardiovascular: rrr Respiratory: diminished at bases  Discharge Instructions   Discharge Instructions    Diet general    Complete by:  As directed      Discharge instructions    Complete by:  As directed   Cbc 1 week     Increase activity slowly    Complete by:  As directed      Weight bearing as tolerated    Complete by:  As directed   Laterality:  right  Extremity:  Lower  Current Discharge Medication List    START taking these medications   Details  ferrous gluconate (FERGON) 324 MG tablet Take 1 tablet (324 mg total) by mouth daily with breakfast. Refills: 3    meclizine (ANTIVERT) 12.5 MG tablet Take 1 tablet (12.5 mg total) by mouth 3 (three) times daily as needed for dizziness. Qty: 30 tablet, Refills: 0      CONTINUE these medications which have CHANGED   Details  aspirin 325 MG  tablet Take 1 tablet (325 mg total) by mouth daily. Qty: 30 tablet, Refills: 0    HYDROcodone-acetaminophen (NORCO) 5-325 MG tablet Take 1-2 tablets by mouth every 6 (six) hours as needed for moderate pain. Qty: 90 tablet, Refills: 0      CONTINUE these medications which have NOT CHANGED   Details  acetaminophen (TYLENOL) 325 MG tablet Take 650 mg by mouth every 6 (six) hours as needed for moderate pain.    Biotin 10 MG CAPS Take 1 tablet by mouth daily.    docusate sodium (COLACE) 100 MG capsule Take 1 capsule (100 mg total) by mouth daily. Qty: 60 capsule, Refills: 0    Fish Oil-Krill Oil (KRILL OIL PLUS) CAPS Take 1 capsule by mouth daily.    Multiple Vitamin (MULTIVITAMIN WITH MINERALS) TABS Take 1 tablet by mouth daily.    Doxylamine Succinate, Sleep, (SLEEP AID PO) Take 1 tablet by mouth at bedtime as needed (for sleep).    simvastatin (ZOCOR) 40 MG tablet Take 40 mg by mouth daily.      STOP taking these medications     hydrochlorothiazide (HYDRODIURIL) 12.5 MG tablet        Allergies  Allergen Reactions  . Penicillins Other (See Comments)    Unknown    Follow-up Information    Follow up with MURPHY, TIMOTHY D, MD In 10 days.   Specialty:  Orthopedic Surgery   Contact information:   [address] [place] [address] [phone]       Follow up with Paulino Rily, MD In 1 week.   Specialty:  Family Medicine   Why:  cbc   Contact information:   [address] [place] [address] [phone]        The results of significant diagnostics from this hospitalization (including imaging, microbiology, ancillary and laboratory) are listed below for reference.    Significant Diagnostic Studies: Dg Chest 1 View  09/10/2015  CLINICAL DATA:  Right hip pain after falling today. History of pelvic fractures. Initial encounter. EXAM: CHEST 1 VIEW COMPARISON:  10/15/2014 radiographs. FINDINGS: 1814 hours. The heart size and mediastinal contours  are stable. There is aortic atherosclerosis and mild chronic vascular congestion. The lungs are otherwise clear. No pleural effusion, pneumothorax or acute osseous abnormality identified. IMPRESSION: No acute posttraumatic findings or active cardiopulmonary process identified. Electronically Signed   By: Carey Bullocks M.D.   On: 09/10/2015 18:26   Ct Head Wo Contrast  09/10/2015  CLINICAL DATA:  Lost balance while hanging a picture on the wall, fell landing on RIGHT hip, dizziness, suffered a RIGHT hip fracture, nausea, hypertension, atrial fibrillation, former smoker EXAM: CT HEAD WITHOUT CONTRAST TECHNIQUE: Contiguous axial images were obtained from the base of the skull through the vertex without intravenous contrast. COMPARISON:  10/07/2012 FINDINGS: Generalized atrophy. Normal ventricular morphology. No midline shift or mass effect. Small vessel chronic ischemic changes of deep cerebral white matter. No intracranial hemorrhage, mass lesion, or acute infarction. Visualized paranasal sinuses and mastoid air cells  clear. Bones demineralized. Atherosclerotic calcification of internal carotid and vertebral arteries at skullbase. IMPRESSION: Atrophy with small vessel chronic ischemic changes of deep cerebral white matter. No acute intracranial abnormalities. Electronically Signed   By: Ulyses SouthwardMark  Boles M.D.   On: 09/10/2015 19:24   Pelvis Portable  09/11/2015  CLINICAL DATA:  Postop from internal fixation of right hip fracture. EXAM: PORTABLE PELVIS 1-2 VIEWS COMPARISON:  09/10/2015 FINDINGS: Screw and intramedullary rod fixation of intertrochanteric and subtrochanteric right hip fracture is now seen, which is in near anatomic alignment. No evidence of dislocation. Old fracture deformities of the right superior and inferior pubic rami again noted. IMPRESSION: Internal fixation of right hip fracture in near anatomic alignment. Electronically Signed   By: Myles RosenthalJohn  Stahl M.D.   On: 09/11/2015 08:53   Dg C-arm 1-60  Min  09/11/2015  CLINICAL DATA:  81 year old female undergoing repair of intertrochanteric fracture EXAM: RIGHT FEMUR 2 VIEWS; DG C-ARM 61-120 MIN COMPARISON:  Preoperative radiographs 09/10/2015 FINDINGS: Interval operative repair of intertrochanteric fracture with intra medullary nail (single distal interlocking screw) and a transfemoral neck lag screw. There is improvement in alignment of the fracture fragments compared to the preoperative radiographs. No evidence of immediate hardware complication. IMPRESSION: ORIF of right intertrochanteric fracture as above. Electronically Signed   By: Malachy MoanHeath  McCullough M.D.   On: 09/11/2015 08:06   Dg Hip Unilat  With Pelvis 2-3 Views Right  09/10/2015  CLINICAL DATA:  Lost balance and fell while hanging a picture on wall, landing on RIGHT hip, felt a pop, RIGHT hip pain, increased pain with movement and palpation, initial encounter EXAM: DG HIP (WITH OR WITHOUT PELVIS) 2-3V RIGHT COMPARISON:  10/15/2014 FINDINGS: Osseous demineralization. Nondisplaced intertrochanteric fracture RIGHT femur with a nondisplaced fracture plane extending subtrochanteric. No dislocation. Narrowing of the hip joints bilaterally. Old healed pubic rami fractures on RIGHT. Remaining pelvis appears intact. IMPRESSION: Osseous demineralization with nondisplaced intertrochanteric fracture RIGHT femur extending subtrochanteric. Old RIGHT pubic rami fractures. Electronically Signed   By: Ulyses SouthwardMark  Boles M.D.   On: 09/10/2015 18:25   Dg Femur, Min 2 Views Right  09/11/2015  CLINICAL DATA:  81 year old female undergoing repair of intertrochanteric fracture EXAM: RIGHT FEMUR 2 VIEWS; DG C-ARM 61-120 MIN COMPARISON:  Preoperative radiographs 09/10/2015 FINDINGS: Interval operative repair of intertrochanteric fracture with intra medullary nail (single distal interlocking screw) and a transfemoral neck lag screw. There is improvement in alignment of the fracture fragments compared to the preoperative  radiographs. No evidence of immediate hardware complication. IMPRESSION: ORIF of right intertrochanteric fracture as above. Electronically Signed   By: Malachy MoanHeath  McCullough M.D.   On: 09/11/2015 08:06    Microbiology: Recent Results (from the past 240 hour(s))  Surgical pcr screen     Status: Abnormal   Collection Time: 09/11/15  4:39 AM  Result Value Ref Range Status   MRSA, PCR NEGATIVE NEGATIVE Final   Staphylococcus aureus POSITIVE (A) NEGATIVE Final    Comment:        The Xpert SA Assay (FDA approved for NASAL specimens in patients over 79 years of age), is one component of a comprehensive surveillance program.  Test performance has been validated by [facility] for patients greater than or equal to 79 year old. It is not intended to diagnose infection nor to guide or monitor treatment.      Labs: Basic Metabolic Panel:  Recent Labs Lab 09/10/15 1835 09/13/15 0510 09/14/15 0328  NA 134* 129* 129*  K 3.5 3.8 3.8  CL 102  99* 97*  CO2 GLUCOSE 151* 118* 112*  BUN CREATININE 0.70 0.67 0.59  CALCIUM 9.1 8.2* 8.1*   Liver Function Tests: No results for input(s): AST, ALT, ALKPHOS, BILITOT, PROT, ALBUMIN in the last 168 hours. No results for input(s): LIPASE, AMYLASE in the last 168 hours. No results for input(s): AMMONIA in the last 168 hours. CBC:  Recent Labs Lab 09/10/15 1835 09/13/15 0510 09/14/15 0328  WBC 4.5 12.0* 9.3  NEUTROABS 3.2  --   --   HGB 13.0 8.1* 7.8*  HCT 37.0 23.2* 22.2*  MCV 88.7 88.5 88.4  PLT 223 163 180   Cardiac Enzymes: No results for input(s): CKTOTAL, CKMB, CKMBINDEX, TROPONINI in the last 168 hours. BNP: BNP (last 3 results) No results for input(s): BNP in the last 8760 hours.  ProBNP (last 3 results) No results for input(s): PROBNP in the last 8760 hours.  CBG:  Recent Labs Lab 09/11/15 0454 09/13/15 0727  GLUCAP 126* 110*       Signed:  Khris Jansson  [facility] 09/14/2015, 9:52  AM

## 2015-09-14 NOTE — Care Management Important Message (Signed)
Important Message  Patient Details  Name: Candice Willis MRN: [id number] Date of Birth: 1924-01-07   Medicare Important Message Given:  Yes-second notification given    Orson AloeMegan P Threasa Kinch 09/14/2015, 12:21 PM

## 2015-09-15 NOTE — Progress Notes (Signed)
Physical Therapy Treatment Patient Details Name: Candice GrosJoan S Willis MRN: [id number] DOB: March 19, 1924 Today's Date: 09/15/2015    History of Present Illness 81 yo female post mechanical fall with Rt hip fracture and IM nail completed 09/11/15.Pt with significant PMHx of HTN, osteoporosis, macular degeneration (wears bifocals), PAF, arteriosclerotic cardiovascular disease, memory deficit, pelvis fx, DJD of R hip, left foot surgery, and knee surgery.      PT Comments    Pt is progressing slowly with mobility and continues to be mildly limited by dizziness.  She tested positive for right ear posterior canal BPPV.  x1 horizontal exercises given and Epley's preformed x 2 rounds with mild improvement in symptoms.   PT to follow acutely for deficits listed below.     Follow Up Recommendations  SNF     Equipment Recommendations  None recommended by PT    Recommendations for Other Services   NA     Precautions / Restrictions Precautions Precautions: Fall Precaution Comments: pt with h/o falls and h/o vertigo Restrictions RLE Weight Bearing: Weight bearing as tolerated    Mobility  Bed Mobility Overal bed mobility: Needs Assistance Bed Mobility: Supine to Sit;Sit to Supine     Supine to sit: +2 for physical assistance;Mod assist Sit to supine: +2 for physical assistance;Mod assist   General bed mobility comments: Two person mod assist to help support trunk and maneuver legs both into and out of bed.    Transfers Overall transfer level: Needs assistance Equipment used: Rolling walker (2 wheeled) Transfers: Sit to/from [employer] CorporationStand;Stand Pivot Transfers Sit to Stand: Mod assist;+2 physical assistance Stand pivot transfers: +2 physical assistance;Mod assist       General transfer comment: Two person mod assist to stand with RW, cues for hand placement for transitions, and cues for safe RW use and ok to WB through right leg during steps around to the chair.                            Balance Overall balance assessment: Needs assistance Sitting-balance support: Feet supported;Bilateral upper extremity supported Sitting balance-Leahy Scale: Fair     Standing balance support: Bilateral upper extremity supported Standing balance-Leahy Scale: Poor                      Cognition Arousal/Alertness: Awake/alert Behavior During Therapy: WFL for tasks assessed/performed Overall Cognitive Status: History of cognitive impairments - at baseline                      Exercises Total Joint Exercises Ankle Circles/Pumps: AROM;Both;10 reps Quad Sets: AROM;Right;10 reps Short Arc Quad: AROM;Right;10 reps Heel Slides: AAROM;Right;10 reps Hip ABduction/ADduction: AAROM;Right;10 reps Other Exercises Other Exercises: talked pt through x1 horizontal exercises. Handout and target "A"s given.     General Comments General comments (skin integrity, edema, etc.): See also vestibular assessment.       Pertinent Vitals/Pain Pain Assessment: 0-10 Pain Score: 9  Pain Location: right hip and left side low back Pain Descriptors / Indicators: Aching;Burning;Grimacing;Guarding Pain Intervention(s): Limited activity within patient's tolerance;Monitored during session;Patient requesting pain meds-RN notified       Vestibular Assessment:   09/15/15 0005  Vestibular Assessment  General Observation Pt seated in recliner chair, eye alighment good, wearing bifocals.  Reports glaucoma and h/o bil cataract extraction.   Symptom Behavior  Type of Dizziness Spinning  Frequency of Dizziness with position changes, rapid motion of the head  Duration  of Dizziness <20 seconds  Aggravating Factors Activity in general;Turning head quickly;Turning head sideways  Relieving Factors Lying supine;Head stationary  Occulomotor Exam  Occulomotor Alignment Normal  Spontaneous Absent  Gaze-induced Absent  Saccades Intact  Vestibulo-Occular Reflex  VOR 1 Head Only (x 1 viewing)  symptomatic horizontally, not vertically, slow speed  Auditory  Comments Pt with bil hearing aids, no reports of tinnitis, or recent hearing loss  Positional Testing  Dix-Hallpike Dix-Hallpike Right;Dix-Hallpike Left  Horizontal Canal Testing Horizontal Canal Left;Horizontal Canal Right  Dix-Hallpike Right  Dix-Hallpike Right Duration <20 seconds  Dix-Hallpike Right Symptoms Other (comment) (fleeting rotational, too quick to see direction)  Dix-Hallpike Left  Dix-Hallpike Left Duration none  Dix-Hallpike Left Symptoms No nystagmus  Horizontal Canal Right  Horizontal Canal Right Duration pt doesn't feel "quite right" in this position, but no nystagmus.   Horizontal Canal Right Symptoms Normal  Horizontal Canal Left  Horizontal Canal Left Duration NA  Horizontal Canal Left Symptoms Normal      PT Goals (current goals can now be found in the care plan section) Acute Rehab PT Goals Patient Stated Goal: To get moving again Progress towards PT goals: Progressing toward goals    Frequency  Min 3X/week    PT Plan Current plan remains appropriate       End of Session Equipment Utilized During Treatment: Gait belt Activity Tolerance: Patient limited by pain Patient left: in bed;with call bell/phone within reach;with family/visitor present     Time: 1411-1506 PT Time Calculation (min) (ACUTE ONLY): 55 min  Charges:    2 TA, 1 TE, 1 canalith repositioning            Malvika Tung B. Jhanae Jaskowiak, PT, DPT [phone]   09/15/2015, 12:11 AM

## 2015-09-16 ENCOUNTER — Encounter: Payer: Self-pay | Admitting: Nurse Practitioner

## 2015-09-16 ENCOUNTER — Non-Acute Institutional Stay (SKILLED_NURSING_FACILITY): Payer: Medicare Other | Admitting: Nurse Practitioner

## 2015-09-16 DIAGNOSIS — R413 Other amnesia: Secondary | ICD-10-CM

## 2015-09-16 DIAGNOSIS — I10 Essential (primary) hypertension: Secondary | ICD-10-CM

## 2015-09-16 DIAGNOSIS — S72001D Fracture of unspecified part of neck of right femur, subsequent encounter for closed fracture with routine healing: Secondary | ICD-10-CM | POA: Diagnosis not present

## 2015-09-16 DIAGNOSIS — L89312 Pressure ulcer of right buttock, stage 2: Secondary | ICD-10-CM | POA: Diagnosis not present

## 2015-09-16 DIAGNOSIS — I480 Paroxysmal atrial fibrillation: Secondary | ICD-10-CM

## 2015-09-16 DIAGNOSIS — D62 Acute posthemorrhagic anemia: Secondary | ICD-10-CM

## 2015-09-16 DIAGNOSIS — E871 Hypo-osmolality and hyponatremia: Secondary | ICD-10-CM

## 2015-09-16 DIAGNOSIS — R42 Dizziness and giddiness: Secondary | ICD-10-CM

## 2015-09-16 DIAGNOSIS — L89319 Pressure ulcer of right buttock, unspecified stage: Secondary | ICD-10-CM | POA: Insufficient documentation

## 2015-09-16 NOTE — Assessment & Plan Note (Signed)
09/10/15 CT head w/o contrast IMPRESSION: Atrophy with small vessel chronic ischemic changes of deep cerebral white matter.

## 2015-09-16 NOTE — Progress Notes (Signed)
Patient ID: Candice Willis, female   DOB: 14-May-1924, 81 y.o.   MRN: [id number]  Location:  SNF [facility] Provider:  Chipper Oman NP  Code Status:  DNR Goals of care: Advanced Directive information    Chief Complaint  Patient presents with  . Medical Management of Chronic Issues  . Acute Visit    right buttock pressure ulcer, s/p R hip ORIF     HPI: Patient is a 81 y.o. female seen in the SNF at [facility] today for evaluation of right buttock pressure ulcer with superficial skin missing, no s/s of infection. Hospital stay from 09/10/15 to 09/14/15 S/p ORIF of the right hip fx sustained from a mechanical fall. Hip fracture, right (HCC) s/p IM nail 10/21 by Dr. Wandra Feinstein. Up with PT. ASA for DVT prophylaxis per ortho  Review of Systems  Constitutional: Negative for fever, chills, weight loss, malaise/fatigue and diaphoresis.  HENT: Positive for hearing loss. Negative for congestion, ear discharge, ear pain, nosebleeds and sore throat.        Roof of mouth soreness-resolved  Eyes: Negative for double vision, photophobia and pain.  Respiratory: Negative for cough, hemoptysis, sputum production, shortness of breath, wheezing and stridor.   Cardiovascular: Positive for leg swelling. Negative for chest pain, palpitations, orthopnea, claudication and PND.       RLE  Gastrointestinal: Negative for heartburn, nausea, vomiting, abdominal pain, diarrhea and constipation.  Genitourinary: Negative for dysuria, urgency and frequency.  Musculoskeletal: Positive for joint pain and falls. Negative for myalgias, back pain and neck pain.       Right hip pain with weight bearing.   Skin: Negative for itching and rash.       Right buttock stage II pressure ulcer likely from previous injury, superficial skin missing, mild erythema in the area. Right hip surgical incisions intact, mild erythema, trace edema RLE  Neurological: Positive for dizziness and weakness. Negative for tingling, tremors, sensory  change, speech change, focal weakness, seizures and headaches.  Endo/Heme/Allergies: Does not bruise/bleed easily.  Psychiatric/Behavioral: Positive for memory loss. Negative for depression, suicidal ideas, hallucinations and substance abuse. The patient is not nervous/anxious and does not have insomnia.        Mildly confusion of time and place.     Past Medical History  Diagnosis Date  . Hyperlipidemia   . Hypertension   . Osteoporosis   . Macular degeneration   . Paroxysmal atrial fibrillation (HCC)   . Arteriosclerotic cardiovascular disease   . Palpitations   . Constipation   . Memory deficit   . Ulcer of hard palate   . Pain in right foot   . Thyroid nodule     Status post surgery  . Fracture of multiple pubic rami (HCC) 10/15/14    Right inferior and superior pubic rami  . Degenerative joint disease of right hip 10/15/14    Patient Active Problem List   Diagnosis Date Noted  . Pressure ulcer of right buttock 09/16/2015  . Postoperative anemia due to acute blood loss 09/13/2015  . Hyponatremia 09/13/2015  . PAF (paroxysmal atrial fibrillation) (HCC) 09/11/2015  . Fall 09/10/2015  . Dizziness 09/10/2015  . Hip fracture, right (HCC) 09/10/2015  . Closed right hip fracture (HCC) 09/10/2015  . Hip fracture (HCC) 09/10/2015  . Diarrhea 09/10/2015  . Leg pain, right 06/16/2015  . Stomatitis 10/27/2014  . Vitamin D deficiency 10/24/2014  . Memory deficit   . Ulcer of hard palate   . Pain in right foot   .  Thyroid nodule   . Pelvis fracture, right (HCC) 10/20/2014  . Hyperlipidemia 10/20/2014  . Essential hypertension 10/20/2014  . Osteoporosis 10/20/2014  . Constipation 10/20/2014    Allergies  Allergen Reactions  . Penicillins Other (See Comments)    Unknown     Medications: Patient's Medications  New Prescriptions   No medications on file  Previous Medications   ACETAMINOPHEN (TYLENOL) 325 MG TABLET    Take 650 mg by mouth every 6 (six) hours as needed  for moderate pain.   ASPIRIN 325 MG TABLET    Take 1 tablet (325 mg total) by mouth daily.   BIOTIN 10 MG CAPS    Take 1 tablet by mouth daily.   DOCUSATE SODIUM (COLACE) 100 MG CAPSULE    Take 1 capsule (100 mg total) by mouth daily.   DOXYLAMINE SUCCINATE, SLEEP, (SLEEP AID PO)    Take 1 tablet by mouth at bedtime as needed (for sleep).   FERROUS GLUCONATE (FERGON) 324 MG TABLET    Take 1 tablet (324 mg total) by mouth daily with breakfast.   FISH OIL-KRILL OIL (KRILL OIL PLUS) CAPS    Take 1 capsule by mouth daily.   HYDROCODONE-ACETAMINOPHEN (NORCO) 5-325 MG TABLET    Take 1-2 tablets by mouth every 6 (six) hours as needed for moderate pain.   MECLIZINE (ANTIVERT) 12.5 MG TABLET    Take 1 tablet (12.5 mg total) by mouth 3 (three) times daily as needed for dizziness.   MULTIPLE VITAMIN (MULTIVITAMIN WITH MINERALS) TABS    Take 1 tablet by mouth daily.   SIMVASTATIN (ZOCOR) 40 MG TABLET    Take 40 mg by mouth daily.  Modified Medications   No medications on file  Discontinued Medications   No medications on file    Physical Exam: Filed Vitals:   09/16/15 1608  BP: 130/70  Pulse: 74  Temp: 97.9 F (36.6 C)  TempSrc: Tympanic  Resp: 20   There is no weight on file to calculate BMI.  Physical Exam  Constitutional: She appears well-developed and well-nourished. No distress.  HENT:  Head: Normocephalic and atraumatic.  Right Ear: External ear normal.  Left Ear: External ear normal.  Nose: Nose normal.  Mouth/Throat: Oropharynx is clear and moist. No oropharyngeal exudate.  Eyes: Conjunctivae and EOM are normal. Pupils are equal, round, and reactive to light. Right eye exhibits no discharge. Left eye exhibits no discharge. No scleral icterus.  Neck: Normal range of motion. Neck supple. No JVD present. No tracheal deviation present. No thyromegaly present.  Cardiovascular: Normal rate, regular rhythm and intact distal pulses.  Exam reveals no gallop and no friction rub.   No murmur  heard. Pulmonary/Chest: Effort normal and breath sounds normal. No stridor. No respiratory distress. She has no wheezes. She has no rales. She exhibits no tenderness.  Abdominal: Soft. Bowel sounds are normal. She exhibits no distension and no mass. There is no tenderness. There is no rebound and no guarding.  Musculoskeletal: She exhibits tenderness. She exhibits no edema.  Right hip ROM with pain and weight bearing. Also c/o right sided rib cage pain when palpated but not with deep breath or truncal movement.  Better controlled pain  Lymphadenopathy:    She has no cervical adenopathy.  Neurological: She is alert. She displays normal reflexes. No cranial nerve deficit. She exhibits normal muscle tone. Coordination normal.  Skin: Skin is warm and dry. No rash noted. She is not diaphoretic. No erythema. No pallor.  Right buttock stage  II pressure ulcer likely from previous injury, superficial skin missing, mild erythema in the area. Right hip surgical incisions intact, mild erythema, trace edema RLE   Psychiatric: She has a normal mood and affect. Her behavior is normal. Judgment and thought content normal.    Labs reviewed: Basic Metabolic Panel:  Recent Labs  16/10/96 1835 09/13/15 0510 09/14/15 0328  NA 134* 129* 129*  K 3.5 3.8 3.8  CL 102 99* 97*  CO2 GLUCOSE 151* 118* 112*  BUN CREATININE 0.70 0.67 0.59  CALCIUM 9.1 8.2* 8.1*    Liver Function Tests:  Recent Labs  10/21/14  AST 27  ALT 24  ALKPHOS 63    CBC:  Recent Labs  10/15/14 1208  09/10/15 1835 09/13/15 0510 09/14/15 0328  WBC 12.4*  < > 4.5 12.0* 9.3  NEUTROABS 11.2*  --  3.2  --   --   HGB 11.7*  < > 13.0 8.1* 7.8*  HCT 34.1*  < > 37.0 23.2* 22.2*  MCV 88.6  --  88.7 88.5 88.4  PLT 248  < > 223 163 180  < > = values in this interval not displayed.  Lab Results  Component Value Date   TSH 2.12 10/21/2014   No results found for: HGBA1C Lab Results  Component Value Date    CHOL 138 10/21/2014   HDL 62 10/21/2014   LDLCALC 62 10/21/2014   TRIG 69 10/21/2014    Significant Diagnostic Results since last visit: none  Patient Care Team: Knox Royalty, MD as PCP - General (Family Medicine) Venita Lick, MD as Consulting Physician (Orthopedic Surgery) Marykay Lex, MD as Consulting Physician (Cardiology)  Assessment/Plan Problem List Items Addressed This Visit    Dizziness - Primary    09/10/15 CT head w/o contrast IMPRESSION: Atrophy with small vessel chronic ischemic changes of deep cerebral white matter.      Essential hypertension    Controlled w/o meds.       Hip fracture, right (HCC)    09/11/15 X-ray IMPRESSION: Internal fixation of right hip fracture in near anatomic alignment. Incisions intact, mild erythema and swelling in the areas.       Hyponatremia    Serum sodium 125-130, repeat CMP      Memory deficit    Obtain MMSE, TSH      PAF (paroxysmal atrial fibrillation) (HCC)    Heart rate is in control upon my examination.       Postoperative anemia due to acute blood loss    Hgb around 8, continue Fe, update CBC in am.       Pressure ulcer of right buttock    Stage II likely from previous injury, not infected, pressure reduction, barrier cream prn.           Family/ staff Communication: here for Rehab, goal is to [place] or [place]  Labs/tests ordered: CBC, CMP, TSH, MMSE  ManXie Jyron Turman NP Geriatrics [facility] [address], [place] [address] On Call:  [phone] & follow prompts after 5pm & weekends Office Phone:  [phone] Office Fax:  [phone]

## 2015-09-16 NOTE — Assessment & Plan Note (Signed)
Heart rate is in control upon my examination.  

## 2015-09-16 NOTE — Assessment & Plan Note (Signed)
Hgb around 8, continue Fe, update CBC in am.

## 2015-09-16 NOTE — Assessment & Plan Note (Signed)
Obtain MMSE, TSH

## 2015-09-16 NOTE — Assessment & Plan Note (Signed)
Stage II likely from previous injury, not infected, pressure reduction, barrier cream prn.

## 2015-09-16 NOTE — Assessment & Plan Note (Signed)
Controlled w/o meds 

## 2015-09-16 NOTE — Assessment & Plan Note (Addendum)
Serum sodium 125-130, repeat CMP

## 2015-09-16 NOTE — Assessment & Plan Note (Signed)
09/11/15 X-ray IMPRESSION: Internal fixation of right hip fracture in near anatomic alignment. Incisions intact, mild erythema and swelling in the areas.

## 2015-09-17 NOTE — Progress Notes (Signed)
This patients fracture was displaced on fluoroscopic films obtained pre-op so she had a displaced intertroch/subtroch fracture. I performed a closed reduction and IM nail of this fracture.   Elyas Villamor D

## 2015-09-18 LAB — BASIC METABOLIC PANEL
BUN: 16 mg/dL (ref 4–21)
CREATININE: 0.6 mg/dL (ref 0.5–1.1)
POTASSIUM: 5.2 mmol/L (ref 3.4–5.3)
Sodium: 128 mmol/L — AB (ref 137–147)

## 2015-09-18 LAB — HEPATIC FUNCTION PANEL
ALK PHOS: 127 U/L — AB (ref 25–125)
ALT: 48 U/L — AB (ref 7–35)
AST: 61 U/L — AB (ref 13–35)
Bilirubin, Total: 1.5 mg/dL

## 2015-09-18 LAB — CBC AND DIFFERENTIAL
HEMATOCRIT: 25 % — AB (ref 36–46)
Hemoglobin: 8.7 g/dL — AB (ref 12.0–16.0)
PLATELETS: 436 10*3/uL — AB (ref 150–399)
WBC: 11.1 10^3/mL

## 2015-09-18 LAB — TSH: TSH: 1.73 u[IU]/mL (ref 0.41–5.90)

## 2015-09-18 NOTE — Anesthesia Postprocedure Evaluation (Signed)
  Anesthesia Post-op Note  Patient: Candice Willis  Procedure(s) Performed: Procedure(s): INTRAMEDULLARY (IM) NAIL INTERTROCHANTRIC (Right)  Patient Location:   Anesthesia Type:General  Level of Consciousness: awake  Airway and Oxygen Therapy: Patient Spontanous Breathing  Post-op Pain: mild  Post-op Assessment: Post-op Vital signs reviewed     RLE Motor Response: Purposeful movement RLE Sensation: Full sensation      Post-op Vital Signs: Reviewed  Last Vitals:  Filed Vitals:   09/14/15 1417  BP: 108/46  Pulse: 62  Temp:   Resp:     Complications: No apparent anesthesia complications

## 2015-09-21 ENCOUNTER — Other Ambulatory Visit: Payer: Self-pay | Admitting: Nurse Practitioner

## 2015-09-21 DIAGNOSIS — E871 Hypo-osmolality and hyponatremia: Secondary | ICD-10-CM

## 2015-09-21 DIAGNOSIS — D62 Acute posthemorrhagic anemia: Secondary | ICD-10-CM

## 2015-09-21 DIAGNOSIS — M79604 Pain in right leg: Secondary | ICD-10-CM

## 2015-09-22 LAB — CBC AND DIFFERENTIAL
HCT: 24 % — AB (ref 36–46)
Hemoglobin: 8.5 g/dL — AB (ref 12.0–16.0)
Platelets: 535 10*3/uL — AB (ref 150–399)
WBC: 8.2 10*3/mL

## 2015-09-22 LAB — LIPID PANEL
CHOLESTEROL: 92 mg/dL (ref 0–200)
HDL: 40 mg/dL (ref 35–70)
LDL Cholesterol: 41 mg/dL
TRIGLYCERIDES: 57 mg/dL (ref 40–160)

## 2015-09-22 LAB — BASIC METABOLIC PANEL
BUN: 10 mg/dL (ref 4–21)
CREATININE: 0.7 mg/dL (ref 0.5–1.1)
Glucose: 88 mg/dL
Potassium: 4.6 mmol/L (ref 3.4–5.3)
SODIUM: 129 mmol/L — AB (ref 137–147)

## 2015-09-23 ENCOUNTER — Other Ambulatory Visit: Payer: Self-pay | Admitting: Nurse Practitioner

## 2015-09-23 DIAGNOSIS — R413 Other amnesia: Secondary | ICD-10-CM

## 2015-09-23 DIAGNOSIS — R42 Dizziness and giddiness: Secondary | ICD-10-CM

## 2015-09-23 DIAGNOSIS — D62 Acute posthemorrhagic anemia: Secondary | ICD-10-CM

## 2015-09-23 DIAGNOSIS — E871 Hypo-osmolality and hyponatremia: Secondary | ICD-10-CM

## 2015-09-30 ENCOUNTER — Encounter: Payer: Self-pay | Admitting: Nurse Practitioner

## 2015-09-30 ENCOUNTER — Non-Acute Institutional Stay (SKILLED_NURSING_FACILITY): Payer: Medicare Other | Admitting: Nurse Practitioner

## 2015-09-30 DIAGNOSIS — E871 Hypo-osmolality and hyponatremia: Secondary | ICD-10-CM | POA: Diagnosis not present

## 2015-09-30 DIAGNOSIS — I10 Essential (primary) hypertension: Secondary | ICD-10-CM

## 2015-09-30 DIAGNOSIS — R42 Dizziness and giddiness: Secondary | ICD-10-CM | POA: Diagnosis not present

## 2015-09-30 DIAGNOSIS — I480 Paroxysmal atrial fibrillation: Secondary | ICD-10-CM

## 2015-09-30 DIAGNOSIS — D62 Acute posthemorrhagic anemia: Secondary | ICD-10-CM | POA: Diagnosis not present

## 2015-09-30 DIAGNOSIS — R413 Other amnesia: Secondary | ICD-10-CM

## 2015-09-30 DIAGNOSIS — M79604 Pain in right leg: Secondary | ICD-10-CM

## 2015-09-30 NOTE — Assessment & Plan Note (Addendum)
Neurology referral, off [employer], R Melatonin 3mg  Improved.

## 2015-09-30 NOTE — Assessment & Plan Note (Signed)
Heart rate is in control upon my examination.

## 2015-09-30 NOTE — Assessment & Plan Note (Signed)
Continue Iron, 09/22/15 Hgb 8.5, 09/29/15 Hgb 9.4

## 2015-09-30 NOTE — Assessment & Plan Note (Addendum)
09/17/15 venous US RLE no evidence of deep venous thrombosis right lower extremity could be identified.  S/p ORIF R hip. C/o pain with hip and left SIJ/pelvis with weight bearing and movement, X-ray pelvis and hips to eval further.

## 2015-09-30 NOTE — Assessment & Plan Note (Signed)
09/17/15 MMSE 30/30

## 2015-09-30 NOTE — Assessment & Plan Note (Signed)
Controlled w/o meds 

## 2015-09-30 NOTE — Progress Notes (Signed)
Patient ID: Candice Willis, female   DOB: [birth year], 81 y.o.   MRN: [id number]  Location:  SNF [facility] Provider:  Chipper Oman NP  Code Status:  DNR Goals of care: Advanced Directive information    Chief Complaint  Patient presents with  . Medical Management of Chronic Issues  . Acute Visit    right hip/left SIJ pain     HPI: Patient is a 81 y.o. female seen in the SNF at [facility] today for evaluation of right buttock pressure ulcer with superficial skin missing, no s/s of infection. Hospital stay from 09/10/15 to 09/14/15 S/p ORIF of the right hip fx sustained from a mechanical fall. Hip fracture, right (HCC) s/p IM nail 10/21 by Dr. Wandra Feinstein. Up with PT. ASA for DVT prophylaxis per ortho  Review of Systems  Constitutional: Negative for fever, chills, weight loss, malaise/fatigue and diaphoresis.  HENT: Positive for hearing loss. Negative for congestion, ear discharge, ear pain, nosebleeds and sore throat.        Roof of mouth soreness-resolved  Eyes: Negative for double vision, photophobia and pain.  Respiratory: Negative for cough, hemoptysis, sputum production, shortness of breath, wheezing and stridor.   Cardiovascular: Negative for chest pain, palpitations, orthopnea, claudication, leg swelling and PND.       RLE  Gastrointestinal: Negative for heartburn, nausea, vomiting, abdominal pain, diarrhea and constipation.  Genitourinary: Negative for dysuria, urgency and frequency.  Musculoskeletal: Positive for joint pain and falls. Negative for myalgias, back pain and neck pain.       Right hip pain with weight bearing, improved. C/o left SIJ/pelvis pain with weight bearing  Skin: Negative for itching and rash.       Right buttock stage II pressure ulcer healed, mild erythema in the area. Right hip surgical incision healed.   Neurological: Negative for dizziness, tingling, tremors, sensory change, speech change, focal weakness, seizures, weakness and headaches.    Endo/Heme/Allergies: Does not bruise/bleed easily.  Psychiatric/Behavioral: Positive for memory loss. Negative for depression, suicidal ideas, hallucinations and substance abuse. The patient is not nervous/anxious and does not have insomnia.        Mildly confusion of time and place.     Past Medical History  Diagnosis Date  . Hyperlipidemia   . Hypertension   . Osteoporosis   . Macular degeneration   . Paroxysmal atrial fibrillation (HCC)   . Arteriosclerotic cardiovascular disease   . Palpitations   . Constipation   . Memory deficit   . Ulcer of hard palate   . Pain in right foot   . Thyroid nodule     Status post surgery  . Fracture of multiple pubic rami (HCC) 10/15/14    Right inferior and superior pubic rami  . Degenerative joint disease of right hip 10/15/14    Patient Active Problem List   Diagnosis Date Noted  . Pressure ulcer of right buttock 09/16/2015  . Postoperative anemia due to acute blood loss 09/13/2015  . Hyponatremia 09/13/2015  . PAF (paroxysmal atrial fibrillation) (HCC) 09/11/2015  . Fall 09/10/2015  . Dizziness 09/10/2015  . Hip fracture, right (HCC) 09/10/2015  . Closed right hip fracture (HCC) 09/10/2015  . Hip fracture (HCC) 09/10/2015  . Diarrhea 09/10/2015  . Leg pain, right 06/16/2015  . Stomatitis 10/27/2014  . Vitamin D deficiency 10/24/2014  . Memory deficit   . Ulcer of hard palate   . Pain in right foot   . Thyroid nodule   . Pelvis fracture, right (  HCC) 10/20/2014  . Hyperlipidemia 10/20/2014  . Essential hypertension 10/20/2014  . Osteoporosis 10/20/2014  . Constipation 10/20/2014    Allergies  Allergen Reactions  . Penicillins Other (See Comments)    Unknown     Medications: Patient's Medications  New Prescriptions   No medications on file  Previous Medications   ACETAMINOPHEN (TYLENOL) 325 MG TABLET    Take 650 mg by mouth every 6 (six) hours as needed for moderate pain.   ASPIRIN 325 MG TABLET    Take 1 tablet  (325 mg total) by mouth daily.   BIOTIN 10 MG CAPS    Take 1 tablet by mouth daily.   DOCUSATE SODIUM (COLACE) 100 MG CAPSULE    Take 1 capsule (100 mg total) by mouth daily.   DOXYLAMINE SUCCINATE, SLEEP, (SLEEP AID PO)    Take 1 tablet by mouth at bedtime as needed (for sleep).   FERROUS GLUCONATE (FERGON) 324 MG TABLET    Take 1 tablet (324 mg total) by mouth daily with breakfast.   FISH OIL-KRILL OIL (KRILL OIL PLUS) CAPS    Take 1 capsule by mouth daily.   HYDROCODONE-ACETAMINOPHEN (NORCO) 5-325 MG TABLET    Take 1-2 tablets by mouth every 6 (six) hours as needed for moderate pain.   MECLIZINE (ANTIVERT) 12.5 MG TABLET    Take 1 tablet (12.5 mg total) by mouth 3 (three) times daily as needed for dizziness.   MULTIPLE VITAMIN (MULTIVITAMIN WITH MINERALS) TABS    Take 1 tablet by mouth daily.   SIMVASTATIN (ZOCOR) 40 MG TABLET    Take 40 mg by mouth daily.  Modified Medications   No medications on file  Discontinued Medications   No medications on file    Physical Exam: Filed Vitals:   09/30/15 1214  BP: 120/78  Pulse: 74  Temp: 97.4 F (36.3 C)  TempSrc: Tympanic  Resp: 20   There is no weight on file to calculate BMI.  Physical Exam  Constitutional: She appears well-developed and well-nourished. No distress.  HENT:  Head: Normocephalic and atraumatic.  Right Ear: External ear normal.  Left Ear: External ear normal.  Nose: Nose normal.  Mouth/Throat: Oropharynx is clear and moist. No oropharyngeal exudate.  Eyes: Conjunctivae and EOM are normal. Pupils are equal, round, and reactive to light. Right eye exhibits no discharge. Left eye exhibits no discharge. No scleral icterus.  Neck: Normal range of motion. Neck supple. No JVD present. No tracheal deviation present. No thyromegaly present.  Cardiovascular: Normal rate, regular rhythm and intact distal pulses.  Exam reveals no gallop and no friction rub.   No murmur heard. Pulmonary/Chest: Effort normal and breath sounds  normal. No stridor. No respiratory distress. She has no wheezes. She has no rales. She exhibits no tenderness.  Abdominal: Soft. Bowel sounds are normal. She exhibits no distension and no mass. There is no tenderness. There is no rebound and no guarding.  Musculoskeletal: She exhibits tenderness. She exhibits no edema.  Right hip ROM with pain and weight bearing, improved. New c/o left SIJ/pelvis pain with weight bearing.   Lymphadenopathy:    She has no cervical adenopathy.  Neurological: She is alert. She displays normal reflexes. No cranial nerve deficit. She exhibits normal muscle tone. Coordination normal.  Skin: Skin is warm and dry. No rash noted. She is not diaphoretic. No erythema. No pallor.  Right buttock stage II pressure ulcer healed, mild erythema in the area. Right hip surgical incisions healed.    Psychiatric: She  has a normal mood and affect. Her behavior is normal. Judgment and thought content normal.    Labs reviewed: Basic Metabolic Panel:  Recent Labs  16/10/96 1835 09/13/15 0510 09/14/15 0328 09/18/15 09/22/15  NA 134* 129* 129* 128* 129*  K 3.5 3.8 3.8 5.2 4.6  CL 102 99* 97*  --   --   CO2 --   --   GLUCOSE 151* 118* 112*  --   --   BUN CREATININE 0.70 0.67 0.59 0.6 0.7  CALCIUM 9.1 8.2* 8.1*  --   --     Liver Function Tests:  Recent Labs  10/21/14 09/18/15  AST 27 61*  ALT 24 48*  ALKPHOS 63 127*    CBC:  Recent Labs  10/15/14 1208  09/10/15 1835 09/13/15 0510 09/14/15 0328 09/18/15 09/22/15  WBC 12.4*  < > 4.5 12.0* 9.3 11.1 8.2  NEUTROABS 11.2*  --  3.2  --   --   --   --   HGB 11.7*  < > 13.0 8.1* 7.8* 8.7* 8.5*  HCT 34.1*  < > 37.0 23.2* 22.2* 25* 24*  MCV 88.6  --  88.7 88.5 88.4  --   --   PLT 248  < > 223 163 180 436* 535*  < > = values in this interval not displayed.  Lab Results  Component Value Date   TSH 1.73 09/18/2015   No results found for: HGBA1C Lab Results  Component Value Date   CHOL  92 09/22/2015   HDL 40 09/22/2015   LDLCALC 41 09/22/2015   TRIG 57 09/22/2015    Significant Diagnostic Results since last visit: none  Patient Care Team: Knox Royalty, MD as PCP - General (Family Medicine) Venita Lick, MD as Consulting Physician (Orthopedic Surgery) Marykay Lex, MD as Consulting Physician (Cardiology)  Assessment/Plan Problem List Items Addressed This Visit    Essential hypertension    Controlled w/o meds.       Memory deficit    09/17/15 MMSE 30/30      Leg pain, right    09/17/15 venous US RLE no evidence of deep venous thrombosis right lower extremity could be identified.  S/p ORIF R hip. C/o pain with hip and left SIJ/pelvis with weight bearing and movement, X-ray pelvis and hips to eval further.       Dizziness    Neurology referral, off [employer], R Melatonin  Improved.       PAF (paroxysmal atrial fibrillation) (HCC)    Heart rate is in control upon my examination.       Postoperative anemia due to acute blood loss    Continue Iron, 09/22/15 Hgb 8.5, 09/29/15 Hgb 9.4      Hyponatremia - Primary    Serum sodium 125-130          Family/ staff Communication: here for Rehab, goal is to [place] or [place]  Labs/tests ordered: none  [facility] Ceyda Peterka NP Geriatrics [facility] [address], [place] [address] On Call:  [phone] & follow prompts after 5pm & weekends Office Phone:  [phone] Office Fax:  [phone]

## 2015-09-30 NOTE — Assessment & Plan Note (Signed)
Serum sodium 125-130

## 2015-10-13 ENCOUNTER — Ambulatory Visit (INDEPENDENT_AMBULATORY_CARE_PROVIDER_SITE_OTHER): Payer: Medicare Other | Admitting: Neurology

## 2015-10-13 ENCOUNTER — Encounter: Payer: Self-pay | Admitting: Neurology

## 2015-10-13 ENCOUNTER — Encounter: Payer: Self-pay | Admitting: *Deleted

## 2015-10-13 VITALS — BP 129/59 | HR 53 | Ht 63.0 in | Wt 116.2 lb

## 2015-10-13 DIAGNOSIS — W19XXXA Unspecified fall, initial encounter: Secondary | ICD-10-CM

## 2015-10-13 DIAGNOSIS — H9193 Unspecified hearing loss, bilateral: Secondary | ICD-10-CM | POA: Diagnosis not present

## 2015-10-13 DIAGNOSIS — R270 Ataxia, unspecified: Secondary | ICD-10-CM | POA: Diagnosis not present

## 2015-10-13 DIAGNOSIS — R42 Dizziness and giddiness: Secondary | ICD-10-CM

## 2015-10-13 NOTE — Progress Notes (Signed)
[facility]    Provider:  Dr Lucia GaskinsAhern Referring Provider: Knox RoyaltyJones, Enrico, MD Primary Care Physician:  Paulino RilyJONES,ENRICO G, MD  CC:  vertigo  HPI:  Candice GrosJoan S Willis is a 81 y.o. female here as a referral from Dr. Yetta BarreJones for vertigo.She has a PMHx of afib, dizziness, memory deficit, HTN. She fell a month ago when she was trying to hang a Ship brokermirror. [facility] the [facility]. She turned around and she saw a blue light and she was on the ground. She broke 3 areas in her hip joint. They put a bar in her leg due to the fall. She has had vertigo for years. For many years it was resolved and now it is coming back. The room spins. Sometimes the whole room can spin and she looks at one spot and it goes away. She has had a couple of times since she fell. It happens when she moves her head. Lasts very briefly, a few seconds maybe a minute if it is really bad. Looking at one spot helps resolve the symptoms. She denies any recent illnesses or URI or ear infections. No known strokes. The meclizine does not help. She gets queasy with the episodes but no vomiting. No new hearing loss. She has fallen many times. She fell and broke her pelvic bone last thanksgiving she was in the shower and was hurrying and fell.    Reviewed notes, labs and imaging from outside physicians, which showed:  CT of the head 08/2015:: Atrophy with small vessel chronic ischemic changes of deep cerebral white matter.No acute intracranial abnormalities.  BMP with hyponatremia (Na 129) CBC with anemia and elevated plts (8.5/24, 535)  Review of Systems: Patient complains of symptoms per HPI as well as the following symptoms: dizziness, joint swelling, spinning sensation, incontinence, decreased energy. Pertinent negatives per HPI. All others negative.   Social History   Social History  . Marital Status: Single    Spouse Name: N/A  . Number of Children: 0  . Years of Education: 16+   Occupational History  .  Retired     Social History Main Topics  . Smoking status: Former Smoker -- 0.50 packs/day    Types: Cigarettes    Quit date: 10/05/1971  . Smokeless tobacco: Never Used  . Alcohol Use: Yes     Comment: 1-4 per week  . Drug Use: No  . Sexual Activity: Not on file   Other Topics Concern  . Not on file   Social History Narrative   [facility] [place]   Caffeine use: 2 cups coffee per day   No tea/soda    Family History  Problem Relation Age of Onset  . Heart disease Father   . Cancer Father     prostate  . Hyperlipidemia Sister   . Cancer Sister     breast  . Heart disease Brother   . Hyperlipidemia Brother   . Cancer Brother     prostate  . Cancer Maternal Grandmother     colon  . Heart disease Paternal Grandmother     Past Medical History  Diagnosis Date  . Hyperlipidemia   . Hypertension   . Osteoporosis   . Macular degeneration   . Paroxysmal atrial fibrillation (HCC)   . Arteriosclerotic cardiovascular disease   . Palpitations   . Constipation   . Memory deficit   . Ulcer of hard palate   . Pain in right foot   . Thyroid nodule  Status post surgery  . Fracture of multiple pubic rami (HCC) 10/15/14    Right inferior and superior pubic rami  . Degenerative joint disease of right hip 10/15/14    Past Surgical History  Procedure Laterality Date  . Appendectomy  1940's  . Abdominal hysterectomy  1968  . Foot surgery      left  . Knee surgery    . Intramedullary (im) nail intertrochanteric Right 09/11/2015    Procedure: INTRAMEDULLARY (IM) NAIL INTERTROCHANTRIC;  Surgeon: Sheral Apley, MD;  Location: [facility] OR;  Service: Orthopedics;  Laterality: Right;    Current Outpatient Prescriptions  Medication Sig Dispense Refill  . acetaminophen (TYLENOL) 325 MG tablet Take 650 mg by mouth every 6 (six) hours as needed for moderate pain.    Marland Kitchen aspirin 325 MG tablet Take 1 tablet (325 mg total) by mouth daily. 30 tablet 0  . atorvastatin  (LIPITOR) 40 MG tablet Take 20 mg by mouth daily.    . Biotin 10 MG CAPS Take 5 mg by mouth 2 (two) times daily.     Marland Kitchen docusate sodium (COLACE) 100 MG capsule Take 1 capsule (100 mg total) by mouth daily. (Patient taking differently: Take 100 mg by mouth daily as needed for mild constipation. ) 60 capsule 0  . Doxylamine Succinate, Sleep, (SLEEP AID PO) Take 1 tablet by mouth at bedtime as needed (for sleep).    . ferrous gluconate (FERGON) 324 MG tablet Take 1 tablet (324 mg total) by mouth daily with breakfast.  3  . Fish Oil-Krill Oil (KRILL OIL PLUS) CAPS Take 1 capsule by mouth daily.    Marland Kitchen FLUVIRIN SUSP Inject 1 Dose into the muscle once.    Marland Kitchen HYDROcodone-acetaminophen (NORCO) 5-325 MG tablet Take 1-2 tablets by mouth every 6 (six) hours as needed for moderate pain. 90 tablet 0  . lactose free nutrition (BOOST) LIQD Take 237 mLs by mouth 2 (two) times daily between meals.    . meclizine (ANTIVERT) 12.5 MG tablet Take 1 tablet (12.5 mg total) by mouth 3 (three) times daily as needed for dizziness. 30 tablet 0  . Melatonin 3 MG CAPS Take 1 capsule by mouth at bedtime as needed.    . Multiple Vitamin (MULTIVITAMIN WITH MINERALS) TABS Take 1 tablet by mouth daily.    . polyethylene glycol (MIRALAX / GLYCOLAX) packet Take 17 g by mouth daily.    . simvastatin (ZOCOR) 40 MG tablet Take 40 mg by mouth daily.     No current facility-administered medications for this visit.    Allergies as of 10/13/2015 - Review Complete 10/13/2015  Allergen Reaction Noted  . Penicillins Other (See Comments) 10/07/2012    Vitals: BP 129/59 mmHg  Pulse 53  Ht  (1.6 m)  Wt 116 lb 3.2 oz (52.708 kg)  BMI 20.59 kg/m2 Last Weight:  Wt Readings from Last 1 Encounters:  10/13/15 116 lb 3.2 oz (52.708 kg)   Last Height:   Ht Readings from Last 1 Encounters:  10/13/15  (1.6 m)    Physical exam: Exam: Gen: NAD, conversant              CV: irregular, no MRG. No Carotid Bruits. No peripheral edema,  warm, nontender Eyes: Conjunctivae clear without exudates or hemorrhage  Neuro: Detailed Neurologic Exam  Speech:    Speech is normal; fluent and spontaneous with normal comprehension.  Cognition:    The patient is oriented to person, place, and time;     recent  memory imaired and remote memory intact;     language fluent;     normal attention, concentration,     fund of knowledge, she knows who is president, doesn't know the VP Cranial Nerves:    Left pupil is small and minimally reactive. The right is reactive.  Attempted fundoscopic exam and could not visalize due to small pupils. Visual fields are full to finger confrontation. Extraocular movements are intact. Trigeminal sensation is intact and the muscles of mastication are normal. The face is symmetric. The palate elevates in the midline. Hearing impaired. Voice is normal. Shoulder shrug is normal. The tongue has normal motion without fasciculations.   Coordination:    Normal finger to nose and heel to shin on the left (can't do the right leg due to recent surgery)   Gait: narrow based with a walker, good stride      Motor Observation:    No asymmetry, no atrophy, and no involuntary movements noted. Tone:    Normal muscle tone.    Posture:    Posture is normal. normal erect    Strength: mild proximal weakness legs.arms but overall intact (could not test right leg due to recent surgery)        Sensation: intact to LT     Reflex Exam:  DTR's:    Deep tendon reflexes in the upper and lower extremities are brisk bilaterally.   Toes:    The toes are downgoing bilaterally.   Clonus:    Clonus is absent.      Assessment/Plan:  A lovely 81 year old female with episodic vertigo, hearing loss, falls, ataxia. Likely BPPV however patient has risk factors for stroke especially afib, need to rule out central cause of vertigo or a vestibular schwannoma, cerebellar infarct and for pathologies affecting the brainstem or vestibular  nerve, vascular event involving the cerebellum, schwannoma. Continue meclizine. Continue exercises for BPPV (epley maneuvers) which appear to be helping. Fall risk, is in PT at her living facility. Needs to always use walking aids, be careful in the shower and sit, take time walking, always keep a light on especially at night.  Cc; Dr. Drucilla Chalet  Naomie Dean, MD  [facility] [address] [place], [place] [address]  Phone [phone] Fax [phone]

## 2015-10-13 NOTE — Patient Instructions (Signed)
Overall you are doing fairly well but I do want to suggest a few things today:   Remember to drink plenty of fluid, eat healthy meals and do not skip any meals. Try to eat protein with a every meal and eat a healthy snack such as fruit or nuts in between meals. Try to keep a regular sleep-wake schedule and try to exercise daily, particularly in the form of walking, 20-30 minutes a day, if you can.   As far as your medications are concerned, I would like to suggest: continue meclizine as needed.   As far as diagnostic testing: MRi of the brain. Continue with the vertigo exercises  I would like to see you back after MRi imaging, sooner if we need to. Please call us with any interim questions, concerns, problems, updates or refill requests.   Our phone number is [phone]. We also have an after hours call service for urgent matters and there is a physician on-call for urgent questions. For any emergencies you know to call 911 or go to the nearest emergency room

## 2015-11-03 ENCOUNTER — Ambulatory Visit
Admission: RE | Admit: 2015-11-03 | Discharge: 2015-11-03 | Disposition: A | Discharge status: Home / Self Care | Payer: Medicare Other | Source: Ambulatory Visit | Attending: Neurology | Admitting: Neurology

## 2015-11-03 DIAGNOSIS — R42 Dizziness and giddiness: Secondary | ICD-10-CM

## 2015-11-03 DIAGNOSIS — R270 Ataxia, unspecified: Secondary | ICD-10-CM

## 2015-11-03 DIAGNOSIS — H9193 Unspecified hearing loss, bilateral: Secondary | ICD-10-CM

## 2015-11-03 DIAGNOSIS — W19XXXA Unspecified fall, initial encounter: Secondary | ICD-10-CM

## 2015-11-03 MED ORDER — GADOBENATE DIMEGLUMINE 529 MG/ML IV SOLN
10.0000 mL | Freq: Once | INTRAVENOUS | Status: AC | PRN
  Administered 2015-11-03: 10 mL via INTRAVENOUS

## 2015-11-09 ENCOUNTER — Telehealth: Payer: Self-pay | Admitting: *Deleted

## 2015-11-09 NOTE — Telephone Encounter (Signed)
LVM for pt to call about results. Gave [facility] phone number.  

## 2015-11-09 NOTE — Telephone Encounter (Signed)
-----   Message from Anson FretAntonia B Ahern, MD sent at 11/08/2015 12:13 PM EST ----- There were no strokes or lesions in the brain to explain her vertigo and dizziness. We did find some brain atrophy which is consistent with aging and memory loss. We also found some mild white matter changes consistent with aging and sometimes atherosclerotic changes (cholesterol etc). I am happy to review it with her in the office if she likes. Nothing acute or concerning.  thanks

## 2015-11-17 ENCOUNTER — Ambulatory Visit: Payer: Medicare Other | Admitting: Neurology

## 2015-11-24 ENCOUNTER — Telehealth: Payer: Self-pay | Admitting: Neurology

## 2015-11-24 NOTE — Telephone Encounter (Signed)
Pt called to get results of MRI. Please call and advise [phone]

## 2015-11-24 NOTE — Telephone Encounter (Signed)
Tried calling pt back. Phone kept ringing. Did not go to VM.

## 2015-11-24 NOTE — Telephone Encounter (Signed)
Error

## 2015-11-25 NOTE — Telephone Encounter (Signed)
Called pt back. Relayed results per Dr Lucia Gaskinsahern note below. Pt verbalized understanding and declines appt to come in and discuss results. Will call back if she wants appt.

## 2015-11-25 NOTE — Telephone Encounter (Signed)
LVM for pt to call about results. Gave [facility] phone number.  

## 2015-11-25 NOTE — Telephone Encounter (Signed)
Pt returned call

## 2015-12-25 ENCOUNTER — Telehealth: Payer: Self-pay | Admitting: Neurology

## 2015-12-25 NOTE — Telephone Encounter (Signed)
Kelly with [facility] [phone] called said pt is having hip nail replaced with partial hip replacement and needs medical clearance. Appt is scheduled with Dr Lucia Gaskins on 01/04/16. FYI only if appt is ok

## 2016-01-04 ENCOUNTER — Ambulatory Visit: Payer: Medicare Other | Admitting: Neurology

## 2016-01-05 ENCOUNTER — Ambulatory Visit (INDEPENDENT_AMBULATORY_CARE_PROVIDER_SITE_OTHER): Payer: Medicare Other | Admitting: Cardiovascular Disease

## 2016-01-05 ENCOUNTER — Encounter: Payer: Self-pay | Admitting: Cardiovascular Disease

## 2016-01-05 ENCOUNTER — Encounter: Payer: Self-pay | Admitting: Neurology

## 2016-01-05 VITALS — BP 140/64 | HR 80 | Ht 63.0 in | Wt 112.8 lb

## 2016-01-05 DIAGNOSIS — I10 Essential (primary) hypertension: Secondary | ICD-10-CM

## 2016-01-05 DIAGNOSIS — Z01810 Encounter for preprocedural cardiovascular examination: Secondary | ICD-10-CM

## 2016-01-05 DIAGNOSIS — I493 Ventricular premature depolarization: Secondary | ICD-10-CM | POA: Diagnosis not present

## 2016-01-05 NOTE — Patient Instructions (Signed)
Medication Instructions:  Your physician recommends that you continue on your current medications as directed. Please refer to the Current Medication list given to you today.   Labwork: None Ordered   Testing/Procedures: Your physician has requested that you have an echocardiogram. Echocardiography is a painless test that uses sound waves to create images of your heart. It provides your doctor with information about the size and shape of your heart and how well your heart's chambers and valves are working. This procedure takes approximately one hour. There are no restrictions for this procedure.    Follow-Up: Your physician recommends that you schedule a follow-up appointment in: as needed with Dr. Nahser.    If you need a refill on your cardiac medications before your next appointment, please call your pharmacy.   Thank you for choosing [facility]! Deserea Bordley, RN [phone]    

## 2016-01-05 NOTE — Progress Notes (Signed)
Cardiology Office Note   Date:  01/05/2016   ID:  JLEE HARKLESS, DOB 1924-04-23, MRN [id number]  PCP:  Paulino Rily, MD  Cardiologist:   Vesta Mixer, MD   Chief Complaint  Patient presents with  . Pre-op Exam   Problem list 1.  History of hip fracture 2. Premature ventricular contractions 3. Essential hypertension 4. Hyperlipidemia 5. PVCs 6. Hx of paroxysmal atrial fib    History of Present Illness: Candice Willis is a 82 y.o. female who presents for preoperative evaluation prior to having hip surgery. She fell and broke her right hip on oct. 2016. Had no cardiac complications with that surgery .   Has a hx of PVCs ( documeneted by ECG )    Past Medical History  Diagnosis Date  . Hyperlipidemia   . Hypertension   . Osteoporosis   . Macular degeneration   . Paroxysmal atrial fibrillation (HCC)   . Arteriosclerotic cardiovascular disease   . Palpitations   . Constipation   . Memory deficit   . Ulcer of hard palate   . Pain in right foot   . Thyroid nodule     Status post surgery  . Fracture of multiple pubic rami (HCC) 10/15/14    Right inferior and superior pubic rami  . Degenerative joint disease of right hip 10/15/14    Past Surgical History  Procedure Laterality Date  . Appendectomy  1940's  . Abdominal hysterectomy  1968  . Foot surgery      left  . Knee surgery    . Intramedullary (im) nail intertrochanteric Right 09/11/2015    Procedure: INTRAMEDULLARY (IM) NAIL INTERTROCHANTRIC;  Surgeon: Sheral Apley, MD;  Location: [facility] OR;  Service: Orthopedics;  Laterality: Right;     Current Outpatient Prescriptions  Medication Sig Dispense Refill  . acetaminophen (TYLENOL) 325 MG tablet Take 650 mg by mouth every 6 (six) hours as needed for moderate pain.    Marland Kitchen aspirin 325 MG tablet Take 1 tablet (325 mg total) by mouth daily. 30 tablet 0  . atorvastatin (LIPITOR) 40 MG tablet Take 20 mg by mouth daily.    . Biotin 10 MG CAPS Take 5 mg by  mouth 2 (two) times daily.     Marland Kitchen docusate sodium (COLACE) 100 MG capsule Take 1 capsule (100 mg total) by mouth daily. (Patient taking differently: Take 100 mg by mouth daily as needed for mild constipation. ) 60 capsule 0  . Doxylamine Succinate, Sleep, (SLEEP AID PO) Take 1 tablet by mouth at bedtime as needed (for sleep).    . ferrous gluconate (FERGON) 324 MG tablet Take 1 tablet (324 mg total) by mouth daily with breakfast.  3  . Fish Oil-Krill Oil (KRILL OIL PLUS) CAPS Take 1 capsule by mouth daily.    Marland Kitchen FLUVIRIN SUSP Inject 1 Dose into the muscle once.    Marland Kitchen HYDROcodone-acetaminophen (NORCO) 5-325 MG tablet Take 1-2 tablets by mouth every 6 (six) hours as needed for moderate pain. 90 tablet 0  . lactose free nutrition (BOOST) LIQD Take 237 mLs by mouth 2 (two) times daily between meals.    . meclizine (ANTIVERT) 12.5 MG tablet Take 1 tablet (12.5 mg total) by mouth 3 (three) times daily as needed for dizziness. 30 tablet 0  . Melatonin 3 MG CAPS Take 1 capsule by mouth at bedtime as needed.    . Multiple Vitamin (MULTIVITAMIN WITH MINERALS) TABS Take 1 tablet by mouth daily.    Marland Kitchen  polyethylene glycol (MIRALAX / GLYCOLAX) packet Take 17 g by mouth daily.    . simvastatin (ZOCOR) 40 MG tablet Take 40 mg by mouth daily.     No current facility-administered medications for this visit.    Allergies:   Penicillins    Social History:  The patient  reports that she quit smoking about 44 years ago. Her smoking use included Cigarettes. She smoked 0.50 packs per day. She has never used smokeless tobacco. She reports that she drinks alcohol. She reports that she does not use illicit drugs.   Family History:  The patient's family history includes Cancer in her brother, father, maternal grandmother, and sister; Heart disease in her brother, father, and paternal grandmother; Hyperlipidemia in her brother and sister.    ROS:  Please see the history of present illness.    Review of  Systems: Constitutional:  denies fever, chills, diaphoresis, appetite change and fatigue.  HEENT: denies photophobia, eye pain, redness, hearing loss, ear pain, congestion, sore throat, rhinorrhea, sneezing, neck pain, neck stiffness and tinnitus.  Respiratory: denies SOB, DOE, cough, chest tightness, and wheezing.  Cardiovascular: denies chest pain, palpitations and leg swelling.  Gastrointestinal: denies nausea, vomiting, abdominal pain, diarrhea, constipation, blood in stool.  Genitourinary: denies dysuria, urgency, frequency, hematuria, flank pain and difficulty urinating.  Musculoskeletal: denies  myalgias, back pain, joint swelling, arthralgias and gait problem.   Skin: denies pallor, rash and wound.  Neurological: denies dizziness, seizures, syncope, weakness, light-headedness, numbness and headaches.   Hematological: denies adenopathy, easy bruising, personal or family bleeding history.  Psychiatric/ Behavioral: denies suicidal ideation, mood changes, confusion, nervousness, sleep disturbance and agitation.       All other systems are reviewed and negative.    PHYSICAL EXAM: VS:  BP 140/64 mmHg  Pulse 80  Ht 5\' 3"  (1.6 m)  Wt 112 lb 12.8 oz (51.166 kg)  BMI 19.99 kg/m2 , BMI Body mass index is 19.99 kg/(m^2). GEN: Well nourished, well developed, in no acute distress HEENT: normal Neck: no JVD, carotid bruits, or masses Cardiac: regularly irregl ; no murmurs, rubs, or gallops,no edema  Respiratory:  clear to auscultation bilaterally, normal work of breathing GI: soft, nontender, nondistended, + BS MS: no deformity or atrophy Skin: warm and dry, no rash Neuro:  Strength and sensation are intact Psych: normal   EKG:  EKG is ordered today. The ekg ordered today demonstrates  NSR at 80 with 1st degree AV block .  No ST or T wave changes.    Recent Labs: 09/18/2015: ALT 48*; TSH 1.73 09/22/2015: BUN 10; Creatinine 0.7; Hemoglobin 8.5*; Platelets 535*; Potassium 4.6; Sodium  129*    Lipid Panel    Component Value Date/Time   CHOL 92 09/22/2015   TRIG 57 09/22/2015   HDL 40 09/22/2015   LDLCALC 41 09/22/2015      Wt Readings from Last 3 Encounters:  01/05/16 112 lb 12.8 oz (51.166 kg)  10/13/15 116 lb 3.2 oz (52.708 kg)  09/13/15 118 lb (53.524 kg)      Other studies Reviewed: Additional studies/ records that were reviewed today include: . Review of the above records demonstrates:    ASSESSMENT AND PLAN:  1.  Pre Op exam :   She fractured her right hip in October, 2006 team. She had a nail placed at that time.    There were no complications with surgery. She now presents for preoperative evaluation. She has frequent premature ventricular contractions in a bigeminal pattern.  I think would  be reasonable to get an echocardiogram for further evaluation of her left ventricular function.  Otherwise she remains very stable. She will follow-up with her general medical doctor.   Current medicines are reviewed at length with the patient today.  The patient does not have concerns regarding medicines.  The following changes have been made:  no change  Labs/ tests ordered today include:  No orders of the defined types were placed in this encounter.     Disposition:   FU with me as needed.     Jeniel Slauson, Deloris Ping, MD  01/05/2016 3:16 PM    [facility] [address] [place], [place], [place]  [address] Phone: [phone]; Fax: [phone]   [facility]  [address] [place], [place]  [address] [phone]   Fax [phone]

## 2016-01-12 ENCOUNTER — Encounter: Payer: Self-pay | Admitting: *Deleted

## 2016-01-12 ENCOUNTER — Ambulatory Visit (INDEPENDENT_AMBULATORY_CARE_PROVIDER_SITE_OTHER): Payer: Medicare Other | Admitting: Nurse Practitioner

## 2016-01-12 ENCOUNTER — Encounter: Payer: Self-pay | Admitting: Nurse Practitioner

## 2016-01-12 VITALS — BP 144/62 | HR 68 | Ht 63.0 in | Wt 114.6 lb

## 2016-01-12 DIAGNOSIS — S72001D Fracture of unspecified part of neck of right femur, subsequent encounter for closed fracture with routine healing: Secondary | ICD-10-CM

## 2016-01-12 DIAGNOSIS — R42 Dizziness and giddiness: Secondary | ICD-10-CM | POA: Diagnosis not present

## 2016-01-12 DIAGNOSIS — I10 Essential (primary) hypertension: Secondary | ICD-10-CM

## 2016-01-12 NOTE — Progress Notes (Signed)
Faxed surgery clearance back to Delbert Harness, [facility]. Fax: [phone]. Received confirmation. Sent copy to MR.

## 2016-01-12 NOTE — Progress Notes (Addendum)
[facility]  PATIENT: Candice Willis DOB: 05/29/1924   REASON FOR VISIT: follow up for vertigo, falls, surgery clearance for hip HISTORY FROM: Patient alone at visit    HISTORY OF PRESENT ILLNESS:Candice Willis, 82 year old female returns for follow-up. She has a history of dizziness and vertigo for years. She also has a prior history of atrial fibrillation, memory deficit and hypertension. She also has hearing loss and wears hearing aids. She denies any dizziness episodes since last seen she is performing her Epley maneuvers. She is here for surgical clearance for nail removal right hip and hemi arthroplasty. MRI of the brain performed 11/03/2015 was abnormal demonstrating moderate diffuse perisylvian and mesial temporal atrophy. Chronic small vessel ischemic disease and no acute findings. She has not fallen since last seen and she ambulates with a rolling walker. She denies any seizure events ,syncope, numbness, or  Headaches. 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 female here as a referral from Dr. Yetta Barre for vertigo.She has a PMHx of afib, dizziness, memory deficit, HTN. She fell a month ago when she was trying to hang a Ship broker. [facility] the [facility]. She turned around and she saw a blue light and she was on the ground. She broke 3 areas in her hip joint. They put a bar in her leg due to the fall. She has had vertigo for years. For many years it was resolved and now it is coming back. The room spins. Sometimes the whole room can spin and she looks at one spot and it goes away. She has had a couple of times since she fell. It happens when she moves her head. Lasts very briefly, a few seconds maybe a minute if it is really bad. Looking at one spot helps resolve the symptoms. She denies any recent illnesses or URI or ear infections. No known strokes. The meclizine does not help. She gets queasy with the episodes but no  vomiting. No new hearing loss. She has fallen many times. She fell and broke her pelvic bone last thanksgiving she was in the shower and was hurrying and fell.    REVIEW OF SYSTEMS: Full 14 system review of systems performed and notable only for those listed, all others are neg:  Constitutional: neg  Cardiovascular: neg Ear/Nose/Throat: neg  Skin: neg Eyes: neg Respiratory: neg Gastroitestinal: neg  Hematology/Lymphatic: neg  Endocrine: neg Musculoskeletal:neg Allergy/Immunology: neg Neurological: neg Psychiatric: neg Sleep : neg   ALLERGIES: Allergies  Allergen Reactions  . Penicillins Other (See Comments)    Unknown     HOME MEDICATIONS: Outpatient Prescriptions Prior to Visit  Medication Sig Dispense Refill  . acetaminophen (TYLENOL) 325 MG tablet Take 650 mg by mouth every 6 (six) hours as needed for moderate pain.    Marland Kitchen aspirin 325 MG tablet Take 1 tablet (325 mg total) by mouth daily. 30 tablet 0  . atorvastatin (LIPITOR) 40 MG tablet Take 20 mg by mouth daily.    . Biotin 10 MG CAPS Take 5 mg by mouth 2 (two) times daily.     Marland Kitchen docusate sodium (COLACE) 100 MG capsule Take 1 capsule (100 mg total) by mouth daily. (Patient taking differently: Take 100 mg by mouth daily as needed for mild constipation. ) 60 capsule 0  . Doxylamine Succinate, Sleep, (SLEEP AID PO) Take 1 tablet by mouth at bedtime as needed (for sleep).    . ferrous gluconate (FERGON) 324 MG  tablet Take 1 tablet (324 mg total) by mouth daily with breakfast.  3  . Fish Oil-Krill Oil (KRILL OIL PLUS) CAPS Take 1 capsule by mouth daily.    Marland Kitchen FLUVIRIN SUSP Inject 1 Dose into the muscle once.    Marland Kitchen HYDROcodone-acetaminophen (NORCO) 5-325 MG tablet Take 1-2 tablets by mouth every 6 (six) hours as needed for moderate pain. 90 tablet 0  . lactose free nutrition (BOOST) LIQD Take 237 mLs by mouth 2 (two) times daily between meals.    . meclizine (ANTIVERT) 12.5 MG tablet Take 1 tablet (12.5 mg total) by mouth 3  (three) times daily as needed for dizziness. 30 tablet 0  . Melatonin 3 MG CAPS Take 1 capsule by mouth at bedtime as needed.    . Multiple Vitamin (MULTIVITAMIN WITH MINERALS) TABS Take 1 tablet by mouth daily.    . polyethylene glycol (MIRALAX / GLYCOLAX) packet Take 17 g by mouth daily.    . simvastatin (ZOCOR) 40 MG tablet Take 40 mg by mouth daily.     No facility-administered medications prior to visit.    PAST MEDICAL HISTORY: Past Medical History  Diagnosis Date  . Hyperlipidemia   . Hypertension   . Osteoporosis   . Macular degeneration   . Paroxysmal atrial fibrillation (HCC)   . Arteriosclerotic cardiovascular disease   . Palpitations   . Constipation   . Memory deficit   . Ulcer of hard palate   . Pain in right foot   . Thyroid nodule     Status post surgery  . Fracture of multiple pubic rami (HCC) 10/15/14    Right inferior and superior pubic rami  . Degenerative joint disease of right hip 10/15/14    PAST SURGICAL HISTORY: Past Surgical History  Procedure Laterality Date  . Appendectomy  1940's  . Abdominal hysterectomy  1968  . Foot surgery      left  . Knee surgery    . Intramedullary (im) nail intertrochanteric Right 09/11/2015    Procedure: INTRAMEDULLARY (IM) NAIL INTERTROCHANTRIC;  Surgeon: Sheral Apley, MD;  Location: [facility] OR;  Service: Orthopedics;  Laterality: Right;    FAMILY HISTORY: Family History  Problem Relation Age of Onset  . Heart disease Father   . Cancer Father     prostate  . Hyperlipidemia Sister   . Cancer Sister     breast  . Heart disease Brother   . Hyperlipidemia Brother   . Cancer Brother     prostate  . Cancer Maternal Grandmother     colon  . Heart disease Paternal Grandmother     SOCIAL HISTORY: Social History   Social History  . Marital Status: Single    Spouse Name: N/A  . Number of Children: 0  . Years of Education: 16+   Occupational History  . Retired     Social History Main Topics  . Smoking  status: Former Smoker -- 0.50 packs/day    Types: Cigarettes    Quit date: 10/05/1971  . Smokeless tobacco: Never Used  . Alcohol Use: Yes     Comment: 1-4 per week  . Drug Use: No  . Sexual Activity: Not on file   Other Topics Concern  . Not on file   Social History Narrative   Lives at [facility].   Caffeine use: 2 cups coffee per day   No tea/soda     PHYSICAL EXAM  Filed Vitals:   01/12/16 0817  Height:  (1.6 m)  Weight: 114 lb 9.6 oz (51.982 kg)   Body mass index is 20.31 kg/(m^2).  Generalized: Well developed, in no acute distress  Head: normocephalic and atraumatic,. Oropharynx benign  Neck: Supple, no carotid bruits  Cardiac: Regular irregular rate rhythm, no murmur  Musculoskeletal: No deformity   Neurological examination   Mentation: Alert oriented to time, place, history taking. Attention span and concentration appropriate. Recent memory impaired and remote memory intact. She knows the president but not the vice president.  Follows all commands speech and language fluent.   Cranial nerve II-XII: Left pupil small and minimally reactive, right pupil reactive.  Visual field were full on confrontational test.  Extraocular movements intact.Facial sensation and strength were normal. hearing was intact to finger rubbing bilaterally. Uvula tongue midline. head turning and shoulder shrug were normal and symmetric.Tongue protrusion into cheek strength was normal. Motor: normal bulk and tone, full strength in the BUE, BLE, except mild proximal weakness in the right leg  Sensory: normal and symmetric to light touch, pinprick, and  Vibration,  in the upper and lower extremities  Coordination: finger-nose-finger, heel-to-shin bilaterally, no dysmetria Reflexes:  symmetric upper and lower  plantar responses were flexor bilaterally. Gait and Station: Rising up from seated position without assistance, normal stance,  moderate stride, smooth turning, ambulates with  rolling walker  DIAGNOSTIC DATA (LABS, IMAGING, TESTING) - I reviewed patient records, labs, notes, testing and imaging myself where available.  Lab Results  Component Value Date   WBC 8.2 09/22/2015   HGB 8.5* 09/22/2015   HCT 24* 09/22/2015   MCV 88.4 09/14/2015   PLT 535* 09/22/2015      Component Value Date/Time   NA 129* 09/22/2015   NA 129* 09/14/2015 0328   K 4.6 09/22/2015   CL 97* 09/14/2015 0328   CO2 26 09/14/2015 0328   GLUCOSE 112* 09/14/2015 0328   BUN 10 09/22/2015   BUN 15 09/14/2015 0328   CREATININE 0.7 09/22/2015   CREATININE 0.59 09/14/2015 0328   CALCIUM 8.1* 09/14/2015 0328   AST 61* 09/18/2015   ALT 48* 09/18/2015   ALKPHOS 127* 09/18/2015   GFRNONAA >60 09/14/2015 0328   GFRAA >60 09/14/2015 0328   Lab Results  Component Value Date   CHOL 92 09/22/2015   HDL 40 09/22/2015   LDLCALC 41 09/22/2015   TRIG 57 09/22/2015    Lab Results  Component Value Date   TSH 1.73 09/18/2015      ASSESSMENT AND PLAN  82 y.o. year old female  has a past medical history of Hyperlipidemia; Hypertension; Osteoporosis; Macular degeneration; Paroxysmal atrial fibrillation (HCC); Arteriosclerotic cardiovascular disease; Palpitations; Memory deficit;  Fracture of multiple pubic rami (HCC) (10/15/14); and Degenerative joint disease of right hip (10/15/14). and long history of vertigo here to follow-up. She needs surgical clearance.MRI of the brain performed 11/03/2015 was abnormal demonstrating moderate diffuse perisylvian and mesial temporal atrophy. Chronic small vessel ischemic disease and no acute findings.   Patient is cleared for. Hip surgery Use  walker at all times Continue meclizine as needed when necessary No follow-up plan Nilda Riggs, [facility], [facility], APRN  [facility] [address], [address] [place], [place] [address] [phone]  Personally examined images, and have participated in and made any corrections needed to history,  physical, neuro exam,assessment and plan as stated above and agree with plan.    Naomie Dean, MD Neurology [facility]

## 2016-01-12 NOTE — Patient Instructions (Signed)
Patient is cleared for. Hip surgery Use  walker at all times Continue meclizine as needed when necessary No follow-up plan

## 2016-01-15 ENCOUNTER — Other Ambulatory Visit: Payer: Self-pay

## 2016-01-15 ENCOUNTER — Ambulatory Visit (HOSPITAL_COMMUNITY): Payer: Medicare Other | Attending: Cardiovascular Disease

## 2016-01-15 DIAGNOSIS — Z01810 Encounter for preprocedural cardiovascular examination: Secondary | ICD-10-CM

## 2016-01-15 DIAGNOSIS — I340 Nonrheumatic mitral (valve) insufficiency: Secondary | ICD-10-CM | POA: Diagnosis not present

## 2016-01-15 DIAGNOSIS — I119 Hypertensive heart disease without heart failure: Secondary | ICD-10-CM | POA: Diagnosis not present

## 2016-01-15 DIAGNOSIS — I351 Nonrheumatic aortic (valve) insufficiency: Secondary | ICD-10-CM | POA: Diagnosis not present

## 2016-01-15 DIAGNOSIS — Z87891 Personal history of nicotine dependence: Secondary | ICD-10-CM | POA: Insufficient documentation

## 2016-01-15 DIAGNOSIS — Z8249 Family history of ischemic heart disease and other diseases of the circulatory system: Secondary | ICD-10-CM | POA: Insufficient documentation

## 2016-01-15 DIAGNOSIS — E785 Hyperlipidemia, unspecified: Secondary | ICD-10-CM | POA: Insufficient documentation

## 2016-01-15 DIAGNOSIS — I071 Rheumatic tricuspid insufficiency: Secondary | ICD-10-CM | POA: Diagnosis not present

## 2016-01-15 DIAGNOSIS — Z01818 Encounter for other preprocedural examination: Secondary | ICD-10-CM | POA: Diagnosis not present

## 2016-01-15 DIAGNOSIS — R002 Palpitations: Secondary | ICD-10-CM | POA: Insufficient documentation

## 2016-01-18 ENCOUNTER — Telehealth: Payer: Self-pay | Admitting: Cardiovascular Disease

## 2016-01-18 NOTE — Telephone Encounter (Signed)
Returning call,she does not know who it was. °

## 2016-01-19 NOTE — Telephone Encounter (Signed)
Left message for patient to call back for echo results 

## 2016-01-19 NOTE — Telephone Encounter (Signed)
Reviewed results with patient who verbalized understanding and thanked me for the call 

## 2016-01-29 ENCOUNTER — Other Ambulatory Visit: Payer: Self-pay | Admitting: Physician Assistant

## 2016-01-29 NOTE — H&P (Signed)
REMOVAL IM NAIL FOLLOWED BY HIP HEMI ADMISSION H&P  Patient is admitted for right HEMIl hip arthroplasty.  Subjective:  Chief Complaint: right hip pain  HPI: Candice Willis, 82 y.o. female, has a history of pain and functional disability in the right hip(s) due to trauma and patient has failed non-surgical conservative treatments for greater than 12 weeks to include use of assistive devices.  Onset of symptoms was abrupt starting 1 years ago with stable course since that time.The patient noted prior procedures of the hip to include IM NAIL on the right hip(s).  Patient currently rates pain in the right hip at 1 out of 10 with activity. Patient has worsening of pain with activity and weight bearing. Patient has evidence of SUBSIDENCE by imaging studies. This condition presents safety issues increasing the risk of falls. This patient has had SUBSIDENCE.  There is no current active infection.  Patient Active Problem List   Diagnosis Date Noted  . PVC's (premature ventricular contractions) 01/05/2016  . Pressure ulcer of right buttock 09/16/2015  . Postoperative anemia due to acute blood loss 09/13/2015  . Hyponatremia 09/13/2015  . PAF (paroxysmal atrial fibrillation) (HCC) 09/11/2015  . Fall 09/10/2015  . Dizziness 09/10/2015  . Hip fracture, right (HCC) 09/10/2015  . Closed right hip fracture (HCC) 09/10/2015  . Hip fracture (HCC) 09/10/2015  . Diarrhea 09/10/2015  . Leg pain, right 06/16/2015  . Stomatitis 10/27/2014  . Vitamin D deficiency 10/24/2014  . Memory deficit   . Ulcer of hard palate   . Pain in right foot   . Thyroid nodule   . Pelvis fracture, right (HCC) 10/20/2014  . Hyperlipidemia 10/20/2014  . Essential hypertension 10/20/2014  . Osteoporosis 10/20/2014  . Constipation 10/20/2014   Past Medical History  Diagnosis Date  . Hyperlipidemia   . Hypertension   . Osteoporosis   . Macular degeneration   . Paroxysmal atrial fibrillation (HCC)   . Arteriosclerotic  cardiovascular disease   . Palpitations   . Constipation   . Memory deficit   . Ulcer of hard palate   . Pain in right foot   . Thyroid nodule     Status post surgery  . Fracture of multiple pubic rami (HCC) 10/15/14    Right inferior and superior pubic rami  . Degenerative joint disease of right hip 10/15/14    Past Surgical History  Procedure Laterality Date  . Appendectomy  1940's  . Abdominal hysterectomy  1968  . Foot surgery      left  . Knee surgery    . Intramedullary (im) nail intertrochanteric Right 09/11/2015    Procedure: INTRAMEDULLARY (IM) NAIL INTERTROCHANTRIC;  Surgeon: Sheral Apley, MD;  Location: [facility] OR;  Service: Orthopedics;  Laterality: Right;     (Not in a hospital admission) Allergies  Allergen Reactions  . Penicillins Other (See Comments)    Unknown     Social History  Substance Use Topics  . Smoking status: Former Smoker -- 0.50 packs/day    Types: Cigarettes    Quit date: 10/05/1971  . Smokeless tobacco: Never Used  . Alcohol Use: Yes     Comment: 1-4 per week    Family History  Problem Relation Age of Onset  . Heart disease Father   . Cancer Father     prostate  . Hyperlipidemia Sister   . Cancer Sister     breast  . Heart disease Brother   . Hyperlipidemia Brother   . Cancer Brother  prostate  . Cancer Maternal Grandmother     colon  . Heart disease Paternal Grandmother      Review of Systems  Constitutional: Negative.   HENT: Positive for hearing loss and tinnitus.   Eyes: Negative.   Respiratory: Negative.   Cardiovascular: Negative.   Gastrointestinal: Negative.   Genitourinary: Negative.   Musculoskeletal: Positive for joint pain.  Skin: Negative.   Neurological: Negative.   Endo/Heme/Allergies: Negative.   Psychiatric/Behavioral: Negative.     Objective:  Physical Exam  Constitutional: She is oriented to person, place, and time. She appears well-developed and well-nourished.  HENT:  Head: Normocephalic  and atraumatic.  Eyes: EOM are normal. Pupils are equal, round, and reactive to light.  Neck: Normal range of motion. Neck supple.  Cardiovascular: Normal rate and regular rhythm.   Respiratory: Effort normal and breath sounds normal.  GI: Soft. Bowel sounds are normal.  Musculoskeletal:  On exam her incisions have healed in well.  No tenderness to palpation.  She does have significant pain when asked to fully weight bear on the right leg and has to immediately shift her weight to the left.  She has full range of motion of the hip with no discomfort doing so.  Sensation is intact with 2+ distal pulses.   Neurological: She is alert and oriented to person, place, and time.  Skin: Skin is warm and dry.  Psychiatric: She has a normal mood and affect. Her behavior is normal. Judgment and thought content normal.    Vital signs in last 24 hours: @VSRANGES @  Labs:   Estimated body mass index is 20.31 kg/(m^2) as calculated from the following:   Height as of 01/12/16: 5\' 3"  (1.6 m).   Weight as of 01/12/16: 51.982 kg (114 lb 9.6 oz).   Imaging Review Plain radiographs demonstrate severe degenerative joint disease of the right hip(s). The bone quality appears to be fair for age and reported activity level.  Assessment/Plan:  End stage arthritis, right hip(s)  The patient history, physical examination, clinical judgement of the provider and imaging studies are consistent with end stage degenerative joint disease of the right hip(s) and total hip arthroplasty is deemed medically necessary. The treatment options including medical management, injection therapy, arthroscopy and arthroplasty were discussed at length. The risks and benefits of total hip arthroplasty were presented and reviewed. The risks due to aseptic loosening, infection, stiffness, dislocation/subluxation,  thromboembolic complications and other imponderables were discussed.  The patient acknowledged the explanation, agreed to proceed  with the plan and consent was signed. Patient is being admitted for inpatient treatment for surgery, pain control, PT, OT, prophylactic antibiotics, VTE prophylaxis, progressive ambulation and ADL's and discharge planning.The patient is planning to be discharged home with home health services

## 2016-02-04 NOTE — Pre-Procedure Instructions (Signed)
Vincent GrosJoan S Lahey  02/04/2016     Your procedure is scheduled on : Tuesday February 16, 2016 at 7:30 AM.  [facility] at 5:30 AM.  Call this number if you have problems the morning of surgery: [phone]    Remember:  Do not eat food or drink liquids after midnight.  Take these medicines the morning of surgery with A SIP OF WATER : Acetaminophen (Tylenol) if needed, Hydrocodone if needed   Stop taking any vitamins, herbal medications/supplements, NSAIDs, Ibuprofen, Advil, Motrin, Aleve, Fish Oil, Biotin, etc on Tuesday March 21st   Do not wear jewelry, make-up or nail polish.  Do not wear lotions, powders, or perfumes.    Do not shave 48 hours prior to surgery.   Do not bring valuables to the hospital.  [facility] is not responsible for any belongings or valuables.  Contacts, dentures or bridgework may not be worn into surgery.  Leave your suitcase in the car.  After surgery it may be brought to your room.  For patients admitted to the hospital, discharge time will be determined by your treatment team.  Patients discharged the day of surgery will not be allowed to drive home.   Name and phone number of your driver:    Special instructions:  Shower using CHG soap the night before and the morning of your surgery  Please read over the following fact sheets that you were given. Pain Booklet, Coughing and Deep Breathing, Blood Transfusion Information, Total Joint Packet, MRSA Information and Surgical Site Infection Prevention

## 2016-02-05 ENCOUNTER — Encounter (HOSPITAL_COMMUNITY)
Admission: RE | Admit: 2016-02-05 | Discharge: 2016-02-05 | Disposition: A | Discharge status: Home / Self Care | Payer: Medicare Other | Source: Ambulatory Visit | Attending: Orthopedic Surgery | Admitting: Orthopedic Surgery

## 2016-02-05 ENCOUNTER — Encounter (HOSPITAL_COMMUNITY): Payer: Self-pay

## 2016-02-05 DIAGNOSIS — Z01818 Encounter for other preprocedural examination: Secondary | ICD-10-CM | POA: Diagnosis not present

## 2016-02-05 DIAGNOSIS — I10 Essential (primary) hypertension: Secondary | ICD-10-CM | POA: Insufficient documentation

## 2016-02-05 DIAGNOSIS — Z87891 Personal history of nicotine dependence: Secondary | ICD-10-CM | POA: Diagnosis not present

## 2016-02-05 DIAGNOSIS — E785 Hyperlipidemia, unspecified: Secondary | ICD-10-CM | POA: Insufficient documentation

## 2016-02-05 DIAGNOSIS — H9190 Unspecified hearing loss, unspecified ear: Secondary | ICD-10-CM | POA: Diagnosis not present

## 2016-02-05 DIAGNOSIS — Z01812 Encounter for preprocedural laboratory examination: Secondary | ICD-10-CM | POA: Insufficient documentation

## 2016-02-05 DIAGNOSIS — Z0183 Encounter for blood typing: Secondary | ICD-10-CM | POA: Insufficient documentation

## 2016-02-05 DIAGNOSIS — I480 Paroxysmal atrial fibrillation: Secondary | ICD-10-CM | POA: Insufficient documentation

## 2016-02-05 DIAGNOSIS — Z79899 Other long term (current) drug therapy: Secondary | ICD-10-CM | POA: Diagnosis not present

## 2016-02-05 DIAGNOSIS — Z7982 Long term (current) use of aspirin: Secondary | ICD-10-CM | POA: Diagnosis not present

## 2016-02-05 HISTORY — DX: Unspecified hearing loss, unspecified ear: H91.90

## 2016-02-05 HISTORY — DX: Dizziness and giddiness: R42

## 2016-02-05 LAB — COMPREHENSIVE METABOLIC PANEL
ALBUMIN: 4.2 g/dL (ref 3.5–5.0)
ALK PHOS: 90 U/L (ref 38–126)
ALT: 26 U/L (ref 14–54)
ANION GAP: 12 (ref 5–15)
AST: 30 U/L (ref 15–41)
BILIRUBIN TOTAL: 0.7 mg/dL (ref 0.3–1.2)
BUN: 15 mg/dL (ref 6–20)
CALCIUM: 9.9 mg/dL (ref 8.9–10.3)
CO2: 23 mmol/L (ref 22–32)
Chloride: 102 mmol/L (ref 101–111)
Creatinine, Ser: 0.78 mg/dL (ref 0.44–1.00)
GFR calc non Af Amer: >60 mL/min (ref >60)
GLUCOSE: 87 mg/dL (ref 65–99)
POTASSIUM: 4.1 mmol/L (ref 3.5–5.1)
Sodium: 137 mmol/L (ref 135–145)
TOTAL PROTEIN: 7.2 g/dL (ref 6.5–8.1)

## 2016-02-05 LAB — CBC WITH DIFFERENTIAL/PLATELET
BASOS PCT: 0 %
Basophils Absolute: 0 10*3/uL (ref 0.0–0.1)
EOS ABS: 0.1 10*3/uL (ref 0.0–0.7)
Eosinophils Relative: 1 %
HEMATOCRIT: 37.7 % (ref 36.0–46.0)
HEMOGLOBIN: 12.7 g/dL (ref 12.0–15.0)
LYMPHS ABS: 1.5 10*3/uL (ref 0.7–4.0)
Lymphocytes Relative: 22 %
MCH: 30.2 pg (ref 26.0–34.0)
MCHC: 33.7 g/dL (ref 30.0–36.0)
MCV: 89.5 fL (ref 78.0–100.0)
MONO ABS: 0.4 10*3/uL (ref 0.1–1.0)
MONOS PCT: 6 %
NEUTROS ABS: 5 10*3/uL (ref 1.7–7.7)
Neutrophils Relative %: 71 %
Platelets: 300 10*3/uL (ref 150–400)
RBC: 4.21 MIL/uL (ref 3.87–5.11)
RDW: 15.6 % — AB (ref 11.5–15.5)
WBC: 7.1 10*3/uL (ref 4.0–10.5)

## 2016-02-05 LAB — SURGICAL PCR SCREEN
MRSA, PCR: NEGATIVE
Staphylococcus aureus: POSITIVE — AB

## 2016-02-05 LAB — PROTIME-INR
INR: 1.02 (ref 0.00–1.49)
Prothrombin Time: 13.6 s (ref 11.6–15.2)

## 2016-02-05 LAB — APTT: aPTT: 29 seconds (ref 24–37)

## 2016-02-05 LAB — TYPE AND SCREEN
ABO/RH(D): A POS
Antibody Screen: NEGATIVE

## 2016-02-05 NOTE — Progress Notes (Signed)
Nurse sent down a A-11 requesting some small regular sized knee length TED hose.

## 2016-02-05 NOTE — Progress Notes (Signed)
Nurse called prescription for [facility]upriocin [facility], and then called and left a voicemail instructing patient to pick up prescription at her earliest convenience.

## 2016-02-06 LAB — URINE CULTURE

## 2016-02-08 NOTE — Progress Notes (Signed)
Anesthesia Chart Review:  Pt is an 82 year old female scheduled for R biopolar hip hemiarthroplasty, R hip hardware removal on 02/16/2016 with Dr. Wandra Feinstein. [name]   PCP is Dr. Knox RoyaltyEnrico Jones.   Pt saw cardiologist Dr. Kristeen MissPhilip Nahser for preop eval 01/05/16 who cleared pt for surgery at low risk.   PMH includes:  PAF, HTN, hyperlipidemia, palpitations, thyroid nodule (s/p surgery). Hard of hearing. Former smoker. BMI 21. S/p IM nail R hip 09/11/15.   Medications include: ASA, lipitor, iron.   Preoperative labs reviewed.    1 view CXR 09/10/15: No active cardiopulmonary process  EKG 01/05/16: sinus rhythm with 1st degree AV block, frequent PVCs.   Echo 01/15/16:  - Left ventricle: The cavity size was normal. There was mild hypertrophy of the posterior wall. Systolic function was normal. The estimated ejection fraction was in the range of 60% to 65%. Wall motion was normal; there were no regional wall motion abnormalities. Features are consistent with a pseudonormal left ventricular filling pattern, with concomitant abnormal relaxation and increased filling pressure (grade 2 diastolic dysfunction). - Aortic valve: There was moderate regurgitation. - Mitral valve: There was mild regurgitation. - Left atrium: The atrium was severely dilated. - Right ventricle: The cavity size was normal. Wall thickness was normal. Systolic function was normal. - Right atrium: The atrium was moderately dilated. - Tricuspid valve: There was mild regurgitation. - Inferior vena cava: The vessel was normal in size. The respirophasic diameter changes were in the normal range (= 50%), consistent with normal central venous pressure.  If no changes, I anticipate pt can proceed with surgery as scheduled.   Rica Mastngela Keiyon Plack, FNP-BC [facility]/Anesthesiology Phone: [phone] 02/08/2016 3:16 PM

## 2016-02-08 NOTE — Progress Notes (Signed)
Patient called Nurse and wanted to know if she needed to stop taking Preservision (multivitamin for her eyes) before surgery. Nurse then called Dr. Maple HudsonMoser and informed him of patients concern, and Dr. Maple HudsonMoser stated that patient did not need to stop taking Preservision before surgery.

## 2016-02-15 MED ORDER — SODIUM CHLORIDE 0.9 % IV SOLN
1000.0000 mg | INTRAVENOUS | Status: AC
  Administered 2016-02-16: 1000 mg via INTRAVENOUS
  Filled 2016-02-15: qty 10

## 2016-02-15 MED ORDER — LACTATED RINGERS IV SOLN
INTRAVENOUS | Status: DC
  Administered 2016-02-16 (×2): via INTRAVENOUS

## 2016-02-15 MED ORDER — VANCOMYCIN HCL IN DEXTROSE 1-5 GM/200ML-% IV SOLN
1000.0000 mg | INTRAVENOUS | Status: AC
  Administered 2016-02-16: 1000 mg via INTRAVENOUS
  Filled 2016-02-15: qty 200

## 2016-02-15 NOTE — Anesthesia Preprocedure Evaluation (Addendum)
Anesthesia Evaluation  Patient identified by MRN, date of birth, ID band Patient awake    Reviewed: Allergy & Precautions, NPO status , Patient's Chart, lab work & pertinent test results  History of Anesthesia Complications Negative for: history of anesthetic complications  Airway Mallampati: II  TM Distance: >3 FB Neck ROM: Full    Dental   Pulmonary former smoker (quit 1972),    breath sounds clear to auscultation       Cardiovascular hypertension, Pt. on medications + dysrhythmias (PAF)  Rhythm:Regular Rate:Normal  ECHO 12/2015 EF 65%, Cleared by cardiology as low risk   Neuro/Psych negative neurological ROS     GI/Hepatic negative GI ROS, Neg liver ROS,   Endo/Other  negative endocrine ROS  Renal/GU negative Renal ROS     Musculoskeletal  (+) Arthritis , Osteoarthritis,    Abdominal   Peds  Hematology   Anesthesia Other Findings   Reproductive/Obstetrics                           Anesthesia Physical Anesthesia Plan  ASA: III  Anesthesia Plan: General   Post-op Pain Management:    Induction: Intravenous  Airway Management Planned: Oral ETT  Additional Equipment:   Intra-op Plan:   Post-operative Plan: Extubation in OR  Informed Consent: I have reviewed the patients History and Physical, chart, labs and discussed the procedure including the risks, benefits and alternatives for the proposed anesthesia with the patient or authorized representative who has indicated his/her understanding and acceptance.     Plan Discussed with:   Anesthesia Plan Comments:        Anesthesia Quick Evaluation

## 2016-02-16 ENCOUNTER — Encounter (HOSPITAL_COMMUNITY)
Admission: RE | Disposition: A | Discharge status: Skilled Nursing Facility | Payer: Self-pay | Source: Ambulatory Visit | Attending: Orthopedic Surgery

## 2016-02-16 ENCOUNTER — Encounter (HOSPITAL_COMMUNITY): Payer: Self-pay | Admitting: Certified Registered"

## 2016-02-16 ENCOUNTER — Inpatient Hospital Stay (HOSPITAL_COMMUNITY): Payer: Medicare Other | Admitting: Certified Registered"

## 2016-02-16 ENCOUNTER — Inpatient Hospital Stay (HOSPITAL_COMMUNITY): Payer: Medicare Other | Admitting: Emergency Medicine

## 2016-02-16 ENCOUNTER — Inpatient Hospital Stay (HOSPITAL_COMMUNITY)
Admission: RE | Admit: 2016-02-16 | Discharge: 2016-02-18 | DRG: 470 | Disposition: A | Discharge status: Skilled Nursing Facility | Payer: Medicare Other | Source: Ambulatory Visit | Attending: Orthopedic Surgery | Admitting: Orthopedic Surgery

## 2016-02-16 ENCOUNTER — Inpatient Hospital Stay (HOSPITAL_COMMUNITY): Payer: Medicare Other

## 2016-02-16 DIAGNOSIS — E785 Hyperlipidemia, unspecified: Secondary | ICD-10-CM | POA: Diagnosis present

## 2016-02-16 DIAGNOSIS — S72009A Fracture of unspecified part of neck of unspecified femur, initial encounter for closed fracture: Secondary | ICD-10-CM | POA: Diagnosis present

## 2016-02-16 DIAGNOSIS — H9190 Unspecified hearing loss, unspecified ear: Secondary | ICD-10-CM | POA: Diagnosis present

## 2016-02-16 DIAGNOSIS — I2510 Atherosclerotic heart disease of native coronary artery without angina pectoris: Secondary | ICD-10-CM | POA: Diagnosis present

## 2016-02-16 DIAGNOSIS — I10 Essential (primary) hypertension: Secondary | ICD-10-CM | POA: Diagnosis present

## 2016-02-16 DIAGNOSIS — H3530 Unspecified macular degeneration: Secondary | ICD-10-CM | POA: Diagnosis present

## 2016-02-16 DIAGNOSIS — M1611 Unilateral primary osteoarthritis, right hip: Principal | ICD-10-CM | POA: Diagnosis present

## 2016-02-16 DIAGNOSIS — M25551 Pain in right hip: Secondary | ICD-10-CM | POA: Diagnosis present

## 2016-02-16 DIAGNOSIS — Z9889 Other specified postprocedural states: Secondary | ICD-10-CM

## 2016-02-16 DIAGNOSIS — Z87891 Personal history of nicotine dependence: Secondary | ICD-10-CM | POA: Diagnosis not present

## 2016-02-16 HISTORY — PX: HIP ARTHROPLASTY: SHX981

## 2016-02-16 HISTORY — PX: HARDWARE REMOVAL: SHX979

## 2016-02-16 SURGERY — HEMIARTHROPLASTY, HIP, DIRECT ANTERIOR APPROACH, FOR FRACTURE
Anesthesia: General | Site: Hip | Laterality: Right

## 2016-02-16 MED ORDER — PRESERVISION/LUTEIN PO CAPS: 2.0000 | ORAL_CAPSULE | Freq: Every day | ORAL | Status: DC

## 2016-02-16 MED ORDER — MEPERIDINE HCL 25 MG/ML IJ SOLN: 6.2500 mg | INTRAMUSCULAR | Status: DC | PRN

## 2016-02-16 MED ORDER — DOCUSATE SODIUM 100 MG PO CAPS
100.0000 mg | ORAL_CAPSULE | Freq: Two times a day (BID) | ORAL | Status: DC
  Administered 2016-02-16 – 2016-02-17 (×3): 100 mg via ORAL
  Filled 2016-02-16 (×4): qty 1

## 2016-02-16 MED ORDER — PHENYLEPHRINE HCL 10 MG/ML IJ SOLN
INTRAMUSCULAR | Status: DC | PRN
  Administered 2016-02-16 (×2): 80 ug via INTRAVENOUS

## 2016-02-16 MED ORDER — FENTANYL CITRATE (PF) 100 MCG/2ML IJ SOLN
INTRAMUSCULAR | Status: DC | PRN
  Administered 2016-02-16 (×3): 50 ug via INTRAVENOUS
  Administered 2016-02-16: 100 ug via INTRAVENOUS

## 2016-02-16 MED ORDER — PROPOFOL 10 MG/ML IV BOLUS
INTRAVENOUS | Status: AC
  Filled 2016-02-16: qty 20

## 2016-02-16 MED ORDER — CHLORHEXIDINE GLUCONATE 4 % EX LIQD: 60.0000 mL | Freq: Once | CUTANEOUS | Status: DC

## 2016-02-16 MED ORDER — LIDOCAINE HCL (CARDIAC) 20 MG/ML IV SOLN
INTRAVENOUS | Status: DC | PRN
  Administered 2016-02-16: 100 mg via INTRAVENOUS

## 2016-02-16 MED ORDER — FENTANYL CITRATE (PF) 100 MCG/2ML IJ SOLN: 25.0000 ug | INTRAMUSCULAR | Status: DC | PRN

## 2016-02-16 MED ORDER — ONDANSETRON HCL 4 MG PO TABS: 4.0000 mg | ORAL_TABLET | Freq: Four times a day (QID) | ORAL | Status: DC | PRN

## 2016-02-16 MED ORDER — ONDANSETRON HCL 4 MG/2ML IJ SOLN: 4.0000 mg | Freq: Four times a day (QID) | INTRAMUSCULAR | Status: DC | PRN

## 2016-02-16 MED ORDER — DEXAMETHASONE SODIUM PHOSPHATE 10 MG/ML IJ SOLN
10.0000 mg | Freq: Once | INTRAMUSCULAR | Status: AC
  Administered 2016-02-17: 10 mg via INTRAVENOUS
  Filled 2016-02-16: qty 1

## 2016-02-16 MED ORDER — PHENOL 1.4 % MT LIQD: 1.0000 | OROMUCOSAL | Status: DC | PRN

## 2016-02-16 MED ORDER — VANCOMYCIN HCL IN DEXTROSE 1-5 GM/200ML-% IV SOLN
1000.0000 mg | Freq: Two times a day (BID) | INTRAVENOUS | Status: AC
  Administered 2016-02-16: 1000 mg via INTRAVENOUS
  Filled 2016-02-16: qty 200

## 2016-02-16 MED ORDER — LIDOCAINE HCL (CARDIAC) 20 MG/ML IV SOLN
INTRAVENOUS | Status: AC
  Filled 2016-02-16: qty 5

## 2016-02-16 MED ORDER — DEXTROSE-NACL 5-0.45 % IV SOLN
INTRAVENOUS | Status: DC
  Administered 2016-02-17: 06:00:00 via INTRAVENOUS

## 2016-02-16 MED ORDER — METOCLOPRAMIDE HCL 5 MG PO TABS: 5.0000 mg | ORAL_TABLET | Freq: Three times a day (TID) | ORAL | Status: DC | PRN

## 2016-02-16 MED ORDER — DEXTROSE 5 % IV SOLN
10.0000 mg | INTRAVENOUS | Status: DC | PRN
  Administered 2016-02-16: 20 ug/min via INTRAVENOUS

## 2016-02-16 MED ORDER — PROPOFOL 10 MG/ML IV BOLUS
INTRAVENOUS | Status: DC | PRN
  Administered 2016-02-16: 120 mg via INTRAVENOUS

## 2016-02-16 MED ORDER — ASPIRIN EC 325 MG PO TBEC
325.0000 mg | DELAYED_RELEASE_TABLET | Freq: Every day | ORAL | Status: DC
  Administered 2016-02-17 – 2016-02-18 (×2): 325 mg via ORAL
  Filled 2016-02-16 (×2): qty 1

## 2016-02-16 MED ORDER — ONDANSETRON HCL 4 MG/2ML IJ SOLN
4.0000 mg | Freq: Once | INTRAMUSCULAR | Status: AC
  Administered 2016-02-16: 4 mg via INTRAVENOUS

## 2016-02-16 MED ORDER — POLYETHYLENE GLYCOL 3350 17 G PO PACK: 17.0000 g | PACK | Freq: Every day | ORAL | Status: DC | PRN

## 2016-02-16 MED ORDER — ROCURONIUM BROMIDE 100 MG/10ML IV SOLN
INTRAVENOUS | Status: DC | PRN
  Administered 2016-02-16: 35 mg via INTRAVENOUS

## 2016-02-16 MED ORDER — GLYCOPYRROLATE 0.2 MG/ML IJ SOLN
INTRAMUSCULAR | Status: DC | PRN
  Administered 2016-02-16: 0.2 mg via INTRAVENOUS

## 2016-02-16 MED ORDER — MORPHINE SULFATE (PF) 2 MG/ML IV SOLN: 1.0000 mg | INTRAVENOUS | Status: DC | PRN

## 2016-02-16 MED ORDER — CEFAZOLIN SODIUM 1 G IJ SOLR
INTRAMUSCULAR | Status: DC | PRN
  Administered 2016-02-16: 1 g via INTRAMUSCULAR

## 2016-02-16 MED ORDER — SUCCINYLCHOLINE CHLORIDE 20 MG/ML IJ SOLN
INTRAMUSCULAR | Status: AC
  Filled 2016-02-16: qty 1

## 2016-02-16 MED ORDER — ROCURONIUM BROMIDE 50 MG/5ML IV SOLN
INTRAVENOUS | Status: AC
  Filled 2016-02-16: qty 1

## 2016-02-16 MED ORDER — 0.9 % SODIUM CHLORIDE (POUR BTL) OPTIME
TOPICAL | Status: DC | PRN
  Administered 2016-02-16 (×2): 1000 mL

## 2016-02-16 MED ORDER — OCUVITE-LUTEIN PO CAPS
2.0000 | ORAL_CAPSULE | Freq: Every day | ORAL | Status: DC
  Filled 2016-02-16: qty 2

## 2016-02-16 MED ORDER — FENTANYL CITRATE (PF) 250 MCG/5ML IJ SOLN
INTRAMUSCULAR | Status: AC
  Filled 2016-02-16: qty 5

## 2016-02-16 MED ORDER — MENTHOL 3 MG MT LOZG
1.0000 | LOZENGE | OROMUCOSAL | Status: DC | PRN
  Filled 2016-02-16: qty 9

## 2016-02-16 MED ORDER — FERROUS GLUCONATE 324 (38 FE) MG PO TABS
324.0000 mg | ORAL_TABLET | Freq: Every day | ORAL | Status: DC
  Administered 2016-02-17 – 2016-02-18 (×2): 324 mg via ORAL
  Filled 2016-02-16 (×2): qty 1

## 2016-02-16 MED ORDER — ACETAMINOPHEN 650 MG RE SUPP: 650.0000 mg | Freq: Four times a day (QID) | RECTAL | Status: DC | PRN

## 2016-02-16 MED ORDER — ONDANSETRON HCL 4 MG/2ML IJ SOLN
INTRAMUSCULAR | Status: AC
  Filled 2016-02-16: qty 2

## 2016-02-16 MED ORDER — CEFAZOLIN SODIUM 1 G IJ SOLR
INTRAMUSCULAR | Status: AC
  Filled 2016-02-16: qty 10

## 2016-02-16 MED ORDER — METOCLOPRAMIDE HCL 5 MG/ML IJ SOLN: 5.0000 mg | Freq: Three times a day (TID) | INTRAMUSCULAR | Status: DC | PRN

## 2016-02-16 MED ORDER — MECLIZINE HCL 12.5 MG PO TABS
12.5000 mg | ORAL_TABLET | Freq: Three times a day (TID) | ORAL | Status: DC | PRN
  Filled 2016-02-16: qty 1

## 2016-02-16 MED ORDER — HYDROCODONE-ACETAMINOPHEN 5-325 MG PO TABS
1.0000 | ORAL_TABLET | ORAL | Status: DC | PRN
  Administered 2016-02-16 (×2): 2 via ORAL
  Administered 2016-02-17 (×2): 1 via ORAL
  Filled 2016-02-16: qty 2
  Filled 2016-02-16: qty 1
  Filled 2016-02-16 (×2): qty 2

## 2016-02-16 MED ORDER — ATORVASTATIN CALCIUM 20 MG PO TABS
20.0000 mg | ORAL_TABLET | Freq: Every day | ORAL | Status: DC
  Administered 2016-02-16 – 2016-02-17 (×2): 20 mg via ORAL
  Filled 2016-02-16 (×2): qty 1

## 2016-02-16 MED ORDER — NEOSTIGMINE METHYLSULFATE 10 MG/10ML IV SOLN
INTRAVENOUS | Status: DC | PRN
  Administered 2016-02-16: 2 mg via INTRAVENOUS

## 2016-02-16 MED ORDER — ONDANSETRON HCL 4 MG/2ML IJ SOLN
INTRAMUSCULAR | Status: DC | PRN
  Administered 2016-02-16: 4 mg via INTRAVENOUS

## 2016-02-16 MED ORDER — PHENYLEPHRINE 40 MCG/ML (10ML) SYRINGE FOR IV PUSH (FOR BLOOD PRESSURE SUPPORT)
PREFILLED_SYRINGE | INTRAVENOUS | Status: AC
  Filled 2016-02-16: qty 10

## 2016-02-16 MED ORDER — ACETAMINOPHEN 325 MG PO TABS
650.0000 mg | ORAL_TABLET | Freq: Four times a day (QID) | ORAL | Status: DC | PRN
  Administered 2016-02-18: 650 mg via ORAL
  Filled 2016-02-16: qty 2

## 2016-02-16 SURGICAL SUPPLY — 57 items
BIT DRILL 7/64X5 DISP (BIT) ×2 IMPLANT
BLADE SAGITTAL 25.0X1.27X90 (BLADE) ×2 IMPLANT
CLSR STERI-STRIP ANTIMIC 1/2X4 (GAUZE/BANDAGES/DRESSINGS) ×4 IMPLANT
COVER SURGICAL LIGHT HANDLE (MISCELLANEOUS) ×2 IMPLANT
DRAPE IMP U-DRAPE 54X76 (DRAPES) ×2 IMPLANT
DRAPE INCISE IOBAN 66X45 STRL (DRAPES) ×4 IMPLANT
DRAPE ORTHO SPLIT 77X108 STRL (DRAPES) ×2
DRAPE SURG ORHT 6 SPLT 77X108 (DRAPES) ×2 IMPLANT
DRAPE U-SHAPE 47X51 STRL (DRAPES) ×2 IMPLANT
DRSG MEPILEX BORDER 4X4 (GAUZE/BANDAGES/DRESSINGS) ×2 IMPLANT
DRSG MEPILEX BORDER 4X8 (GAUZE/BANDAGES/DRESSINGS) ×2 IMPLANT
DRSG VAC ATS SM SENSATRAC (GAUZE/BANDAGES/DRESSINGS) ×2 IMPLANT
DURAPREP 26ML APPLICATOR (WOUND CARE) ×2 IMPLANT
ELECT BLADE 4.0 EZ CLEAN MEGAD (MISCELLANEOUS) ×2
ELECT CAUTERY BLADE 6.4 (BLADE) ×2 IMPLANT
ELECT REM PT RETURN 9FT ADLT (ELECTROSURGICAL) ×2
ELECTRODE BLDE 4.0 EZ CLN MEGD (MISCELLANEOUS) ×1 IMPLANT
ELECTRODE REM PT RTRN 9FT ADLT (ELECTROSURGICAL) ×1 IMPLANT
FACESHIELD WRAPAROUND (MASK) ×4 IMPLANT
GLOVE BIO SURGEON STRL SZ7 (GLOVE) ×2 IMPLANT
GLOVE BIO SURGEON STRL SZ7.5 (GLOVE) IMPLANT
GLOVE BIOGEL PI IND STRL 7.0 (GLOVE) ×3 IMPLANT
GLOVE BIOGEL PI IND STRL 8 (GLOVE) ×1 IMPLANT
GLOVE BIOGEL PI INDICATOR 7.0 (GLOVE) ×3
GLOVE BIOGEL PI INDICATOR 8 (GLOVE) ×1
GLOVE SURG SS PI 7.0 STRL IVOR (GLOVE) ×8 IMPLANT
GOWN STRL REUS W/ TWL LRG LVL3 (GOWN DISPOSABLE) ×1 IMPLANT
GOWN STRL REUS W/ TWL XL LVL3 (GOWN DISPOSABLE) ×2 IMPLANT
GOWN STRL REUS W/TWL LRG LVL3 (GOWN DISPOSABLE) ×1
GOWN STRL REUS W/TWL XL LVL3 (GOWN DISPOSABLE) ×2
HEAD MODULAR ENDO (Orthopedic Implant) ×1 IMPLANT
HEAD UNPLR 42XMDLR STRL HIP (Orthopedic Implant) ×1 IMPLANT
HIP STEM HA 30X155 C-TPR (Hips) ×1 IMPLANT
HIP STEM HA 30X155MM C-TPR (Hips) ×1 IMPLANT
KIT BASIN OR (CUSTOM PROCEDURE TRAY) ×2 IMPLANT
KIT ROOM TURNOVER OR (KITS) ×2 IMPLANT
MANIFOLD NEPTUNE II (INSTRUMENTS) ×2 IMPLANT
NS IRRIG 1000ML POUR BTL (IV SOLUTION) ×4 IMPLANT
PACK TOTAL JOINT (CUSTOM PROCEDURE TRAY) ×2 IMPLANT
PACK UNIVERSAL I (CUSTOM PROCEDURE TRAY) ×2 IMPLANT
PAD ARMBOARD 7.5X6 YLW CONV (MISCELLANEOUS) ×4 IMPLANT
PILLOW ABDUCTION HIP (SOFTGOODS) ×2 IMPLANT
RETRIEVER SUT HEWSON (MISCELLANEOUS) ×2 IMPLANT
SLEEVE CABLE 2MM VT (Orthopedic Implant) ×2 IMPLANT
SLEEVE UNITRAX CTAPER (Orthopedic Implant) ×2 IMPLANT
STEM HIP HA 30X155 C-TPR (Hips) ×1 IMPLANT
STRIP CLOSURE SKIN 1/2X4 (GAUZE/BANDAGES/DRESSINGS) ×2 IMPLANT
SUT FIBERWIRE #2 38 REV NDL BL (SUTURE) ×4
SUT MNCRL AB 4-0 PS2 18 (SUTURE) ×2 IMPLANT
SUT MON AB 2-0 CT1 36 (SUTURE) ×4 IMPLANT
SUT VIC AB 1 CT1 27 (SUTURE) ×1
SUT VIC AB 1 CT1 27XBRD ANBCTR (SUTURE) ×1 IMPLANT
SUTURE FIBERWR#2 38 REV NDL BL (SUTURE) ×2 IMPLANT
TOWEL OR 17X24 6PK STRL BLUE (TOWEL DISPOSABLE) ×2 IMPLANT
TOWEL OR 17X26 10 PK STRL BLUE (TOWEL DISPOSABLE) ×2 IMPLANT
TRAY FOLEY CATH 14FR (SET/KITS/TRAYS/PACK) IMPLANT
WATER STERILE IRR 1000ML POUR (IV SOLUTION) ×4 IMPLANT

## 2016-02-16 NOTE — Anesthesia Procedure Notes (Signed)
Procedure Name: Intubation Date/Time: 02/16/2016 7:40 AM Performed by: Charm BargesBUTLER, Jolie Strohecker R Pre-anesthesia Checklist: Patient identified, Emergency Drugs available, Suction available and Patient being monitored Patient Re-evaluated:Patient Re-evaluated prior to inductionOxygen Delivery Method: Circle System Utilized Preoxygenation: Pre-oxygenation with 100% oxygen Intubation Type: IV induction Ventilation: Mask ventilation without difficulty Laryngoscope Size: Mac and 3 Grade View: Grade II Tube type: Oral Number of attempts: 1 Airway Equipment and Method: Stylet Placement Confirmation: ETT inserted through vocal cords under direct vision,  positive ETCO2 and breath sounds checked- equal and bilateral Secured at: 21 cm Tube secured with: Tape Dental Injury: Teeth and Oropharynx as per pre-operative assessment

## 2016-02-16 NOTE — Op Note (Signed)
02/16/2016  9:18 AM  PATIENT:  Candice Willis    PRE-OPERATIVE DIAGNOSIS:  RIGHT HIP FRACTURE  POST-OPERATIVE DIAGNOSIS:  Same  PROCEDURE:  ARTHROPLASTY BIPOLAR HIP (HEMIARTHROPLASTY), HARDWARE REMOVAL RT HIP  SURGEON:  Aeva Posey D, MD  ASSISTANT: none  ANESTHESIA:   gen  PREOPERATIVE INDICATIONS:  Candice Willis is a  82 y.o. female with a diagnosis of RIGHT HIP FRACTURE who failed conservative measures and elected for surgical management.    The risks benefits and alternatives were discussed with the patient preoperatively including but not limited to the risks of infection, bleeding, nerve injury, cardiopulmonary complications, the need for revision surgery, among others, and the patient was willing to proceed.  OPERATIVE IMPLANTS: stryker retoration hemi stem with 8/14 stem with a 44+0 head. Cable around the greater/lesser.   OPERATIVE FINDINGS: She had healed her calcar well. Her greater trochanter had a solid fibrous union.  BLOOD LOSS: 150  COMPLICATIONS: none  TOURNIQUET TIME: none  OPERATIVE PROCEDURE:  Patient was identified in the preoperative holding area and site was marked by me She was transported to the operating theater and placed on the table in supine position taking care to pad all bony prominences. After a preincinduction time out anesthesia was induced. She was placed in the lateral position again padding all bony prominences. The right lower extremity was prepped and draped in normal sterile fashion and a pre-incision timeout was performed. She received Ancef and thank for preoperative antibiotics.   I made a lateral incision and performed a posterior approach to her hip. After incising her IT band and identified the components of the gamma nail I removed the lag screw after loosening the set screw. I then obtain purchase on the proximal aspect of the gamma nail. Through a stab incision distally I removed the distal interlock screw the nail then came  out intact. I then thoroughly irrigated her canal watching fluid flow out of the distal interlock hole. I irrigated this with a liter.  I then completed the posterior approach to the hip tagging her posterior capsule.  I made a neck cut at the base of the greater in the anatomic location and remove the head for sizing. I examined the acetabulum there was not a significant defect of cartilage here. I irrigated and removed any loose bodies.  Identified the canal I curetted out any fibrous tissue.   At this point I examined the remaining calcar bone felt that there was a solid amount of bone up top rather than resecting this and placing a modular stem I elected to place the restoration fully porous-coated hemi as she had solid subtrochanteric bone and I felt we could obtain an excellent rotational control.  I then sequentially reamed up the shaft to a 13.5. For plain use of a 14 stem I broached the proximal femur up to a size 8. I then trialed these implants she had excellent stability. I removed the trials next I placed a cerclage cable below this calcar bone and around the greater taking care to pass it subperiosteally. Attention this cable and then inserted the implant stem.  I then thoroughly irrigated her joint and her canal.   I then placed the above-mentioned head ball and impacted in the in the place. I reduced her hip she had excellent stability.  I then repaired her posterior capsule through drill holes after a thorough irrigation. I then closed her IT band followed by the skin in layers with absorbable stitch.  Sterile  dressing was applied she was awoken and taken the PACU in stable condition.  POST OPERATIVE PLAN: WBAT, ASA 325 for dvt px.     This note was generated using a template and dragon dictation system. In light of that, I have reviewed the note and all aspects of it are applicable to this case. Any dictation errors are due to the computerized dictation system.

## 2016-02-16 NOTE — Anesthesia Postprocedure Evaluation (Signed)
Anesthesia Post Note  Patient: Vincent GrosJoan S Neels  Procedure(s) Performed: Procedure(s) (LRB): ARTHROPLASTY BIPOLAR HIP (HEMIARTHROPLASTY) (Right) HARDWARE REMOVAL RT HIP (Right)  Patient location during evaluation: PACU Anesthesia Type: General Level of consciousness: awake and alert Pain management: pain level controlled Vital Signs Assessment: post-procedure vital signs reviewed and stable Respiratory status: spontaneous breathing, nonlabored ventilation, respiratory function stable and patient connected to nasal cannula oxygen Cardiovascular status: blood pressure returned to baseline and stable Postop Assessment: no signs of nausea or vomiting Anesthetic complications: no    Last Vitals:  Filed Vitals:   02/16/16 1000 02/16/16 1015  BP:    Pulse: 53 64  Temp:    Resp: 10 13    Last Pain:  Filed Vitals:   02/16/16 1030  PainSc: Asleep                 Sebastian Acheheodore Keyosha Tiedt

## 2016-02-16 NOTE — Interval H&P Note (Signed)
History and Physical Interval Note:  02/16/2016 7:09 AM  Candice GrosJoan S Nyce  has presented today for surgery, with the diagnosis of RIGHT HIP FRACTURE  The various methods of treatment have been discussed with the patient and family. After consideration of risks, benefits and other options for treatment, the patient has consented to  Procedure(s): ARTHROPLASTY BIPOLAR HIP (HEMIARTHROPLASTY) (Right) HARDWARE REMOVAL RT HIP (Right) as a surgical intervention .  The patient's history has been reviewed, patient examined, no change in status, stable for surgery.  I have reviewed the patient's chart and labs.  Questions were answered to the patient's satisfaction.     Melessa Cowell D

## 2016-02-16 NOTE — H&P (View-Only) (Signed)
REMOVAL IM NAIL FOLLOWED BY HIP HEMI ADMISSION H&P  Patient is admitted for right HEMIl hip arthroplasty.  Subjective:  Chief Complaint: right hip pain  HPI: Candice Willis, 82 y.o. female, has a history of pain and functional disability in the right hip(s) due to trauma and patient has failed non-surgical conservative treatments for greater than 12 weeks to include use of assistive devices.  Onset of symptoms was abrupt starting 1 years ago with stable course since that time.The patient noted prior procedures of the hip to include IM NAIL on the right hip(s).  Patient currently rates pain in the right hip at 1 out of 10 with activity. Patient has worsening of pain with activity and weight bearing. Patient has evidence of SUBSIDENCE by imaging studies. This condition presents safety issues increasing the risk of falls. This patient has had SUBSIDENCE.  There is no current active infection.  Patient Active Problem List   Diagnosis Date Noted  . PVC's (premature ventricular contractions) 01/05/2016  . Pressure ulcer of right buttock 09/16/2015  . Postoperative anemia due to acute blood loss 09/13/2015  . Hyponatremia 09/13/2015  . PAF (paroxysmal atrial fibrillation) (HCC) 09/11/2015  . Fall 09/10/2015  . Dizziness 09/10/2015  . Hip fracture, right (HCC) 09/10/2015  . Closed right hip fracture (HCC) 09/10/2015  . Hip fracture (HCC) 09/10/2015  . Diarrhea 09/10/2015  . Leg pain, right 06/16/2015  . Stomatitis 10/27/2014  . Vitamin D deficiency 10/24/2014  . Memory deficit   . Ulcer of hard palate   . Pain in right foot   . Thyroid nodule   . Pelvis fracture, right (HCC) 10/20/2014  . Hyperlipidemia 10/20/2014  . Essential hypertension 10/20/2014  . Osteoporosis 10/20/2014  . Constipation 10/20/2014   Past Medical History  Diagnosis Date  . Hyperlipidemia   . Hypertension   . Osteoporosis   . Macular degeneration   . Paroxysmal atrial fibrillation (HCC)   . Arteriosclerotic  cardiovascular disease   . Palpitations   . Constipation   . Memory deficit   . Ulcer of hard palate   . Pain in right foot   . Thyroid nodule     Status post surgery  . Fracture of multiple pubic rami (HCC) 10/15/14    Right inferior and superior pubic rami  . Degenerative joint disease of right hip 10/15/14    Past Surgical History  Procedure Laterality Date  . Appendectomy  1940's  . Abdominal hysterectomy  1968  . Foot surgery      left  . Knee surgery    . Intramedullary (im) nail intertrochanteric Right 09/11/2015    Procedure: INTRAMEDULLARY (IM) NAIL INTERTROCHANTRIC;  Surgeon: Timothy D Murphy, MD;  Location: [facility] OR;  Service: Orthopedics;  Laterality: Right;     (Not in a hospital admission) Allergies  Allergen Reactions  . Penicillins Other (See Comments)    Unknown     Social History  Substance Use Topics  . Smoking status: Former Smoker -- 0.50 packs/day    Types: Cigarettes    Quit date: 10/05/1971  . Smokeless tobacco: Never Used  . Alcohol Use: Yes     Comment: 1-4 per week    Family History  Problem Relation Age of Onset  . Heart disease Father   . Cancer Father     prostate  . Hyperlipidemia Sister   . Cancer Sister     breast  . Heart disease Brother   . Hyperlipidemia Brother   . Cancer Brother       prostate  . Cancer Maternal Grandmother     colon  . Heart disease Paternal Grandmother      Review of Systems  Constitutional: Negative.   HENT: Positive for hearing loss and tinnitus.   Eyes: Negative.   Respiratory: Negative.   Cardiovascular: Negative.   Gastrointestinal: Negative.   Genitourinary: Negative.   Musculoskeletal: Positive for joint pain.  Skin: Negative.   Neurological: Negative.   Endo/Heme/Allergies: Negative.   Psychiatric/Behavioral: Negative.     Objective:  Physical Exam  Constitutional: She is oriented to person, place, and time. She appears well-developed and well-nourished.  HENT:  Head: Normocephalic  and atraumatic.  Eyes: EOM are normal. Pupils are equal, round, and reactive to light.  Neck: Normal range of motion. Neck supple.  Cardiovascular: Normal rate and regular rhythm.   Respiratory: Effort normal and breath sounds normal.  GI: Soft. Bowel sounds are normal.  Musculoskeletal:  On exam her incisions have healed in well.  No tenderness to palpation.  She does have significant pain when asked to fully weight bear on the right leg and has to immediately shift her weight to the left.  She has full range of motion of the hip with no discomfort doing so.  Sensation is intact with 2+ distal pulses.   Neurological: She is alert and oriented to person, place, and time.  Skin: Skin is warm and dry.  Psychiatric: She has a normal mood and affect. Her behavior is normal. Judgment and thought content normal.    Vital signs in last 24 hours: @VSRANGES@  Labs:   Estimated body mass index is 20.31 kg/(m^2) as calculated from the following:   Height as of 01/12/16: 5' 3" (1.6 m).   Weight as of 01/12/16: 51.982 kg (114 lb 9.6 oz).   Imaging Review Plain radiographs demonstrate severe degenerative joint disease of the right hip(s). The bone quality appears to be fair for age and reported activity level.  Assessment/Plan:  End stage arthritis, right hip(s)  The patient history, physical examination, clinical judgement of the provider and imaging studies are consistent with end stage degenerative joint disease of the right hip(s) and total hip arthroplasty is deemed medically necessary. The treatment options including medical management, injection therapy, arthroscopy and arthroplasty were discussed at length. The risks and benefits of total hip arthroplasty were presented and reviewed. The risks due to aseptic loosening, infection, stiffness, dislocation/subluxation,  thromboembolic complications and other imponderables were discussed.  The patient acknowledged the explanation, agreed to proceed  with the plan and consent was signed. Patient is being admitted for inpatient treatment for surgery, pain control, PT, OT, prophylactic antibiotics, VTE prophylaxis, progressive ambulation and ADL's and discharge planning.The patient is planning to be discharged home with home health services 

## 2016-02-16 NOTE — Progress Notes (Addendum)
Utilization review completed.  L. J. Samanvi Cuccia RN, BSN, CM 

## 2016-02-16 NOTE — Discharge Instructions (Signed)
Maintain posterior hip precautions  Keep incisions dry and covered until follow up  Bear weight as tolerated.

## 2016-02-16 NOTE — Transfer of Care (Signed)
Immediate Anesthesia Transfer of Care Note  Patient: Candice Willis  Procedure(s) Performed: Procedure(s): ARTHROPLASTY BIPOLAR HIP (HEMIARTHROPLASTY) (Right) HARDWARE REMOVAL RT HIP (Right)  Patient Location: PACU  Anesthesia Type:General  Level of Consciousness: awake, oriented and patient cooperative  Airway & Oxygen Therapy: Patient Spontanous Breathing and Patient connected to nasal cannula oxygen  Post-op Assessment: Report given to RN, Post -op Vital signs reviewed and stable and Patient moving all extremities  Post vital signs: Reviewed and stable  Last Vitals:  Filed Vitals:   02/16/16 0625 02/16/16 0947  BP: 172/53 141/75  Pulse: 82 67  Temp: 36.5 C 36.5 C  Resp: 18 11    Complications: No apparent anesthesia complications

## 2016-02-17 ENCOUNTER — Encounter (HOSPITAL_COMMUNITY): Payer: Self-pay | Admitting: Orthopedic Surgery

## 2016-02-17 MED ORDER — PROSIGHT PO TABS
2.0000 | ORAL_TABLET | Freq: Every day | ORAL | Status: DC
  Administered 2016-02-17 – 2016-02-18 (×2): 2 via ORAL
  Filled 2016-02-17 (×3): qty 2

## 2016-02-17 NOTE — Evaluation (Signed)
Physical Therapy Evaluation Patient Details Name: Candice Willis MRN: [id number] DOB: January 27, 1924 Today's Date: 02/17/2016   History of Present Illness  82 y.o. female, who fell approximately 1 year ago with IM nail placement following. The patient states that she had continued pain and is now s/p ARTHROPLASTY BIPOLAR HIP (HEMIARTHROPLASTY), HARDWARE REMOVAL RT HIP. PMH: hypertension, osteoporosis, macular degeneration, pubic rami fracture, vertigo, IM nail 2016.   Clinical Impression  Pt is s/p THA resulting in the deficits listed below (see PT Problem List). Pt is moving very slowly during evaluation. Able to stand X2 with progression to stand pivot turn to chair. Cues utilized throughout session for posterior hip precautions. Pt will benefit from skilled PT to increase their independence and safety with mobility to allow discharge to the venue listed below.      Follow Up Recommendations SNF;Supervision/Assistance - 24 hour    Equipment Recommendations  None recommended by PT;Other (comment) (to be addressed at next venue)    Recommendations for Other Services       Precautions / Restrictions Precautions Precautions: Posterior Hip;Fall Precaution Booklet Issued: Yes (comment) Precaution Comments: HEP and precautions (provided and posted).  Restrictions Weight Bearing Restrictions: Yes RLE Weight Bearing: Weight bearing as tolerated      Mobility  Bed Mobility Overal bed mobility: Needs Assistance Bed Mobility: Supine to Sit     Supine to sit: Mod assist (Rt LE and trunk)     General bed mobility comments: cues for maintaining hip precautions  Transfers Overall transfer level: Needs assistance Equipment used: Rolling walker (2 wheeled) Transfers: Sit to/from Stand Sit to Stand: Mod assist Stand pivot transfers: Mod assist       General transfer comment: Repeat X2 with cues for precautions and hand placement. Pt reports UEs fatigued with initial stand.    Ambulation/Gait                Stairs            Wheelchair Mobility    Modified Rankin (Stroke Patients Only)       Balance Overall balance assessment: Needs assistance Sitting-balance support: Bilateral upper extremity supported Sitting balance-Leahy Scale: Poor     Standing balance support: Bilateral upper extremity supported Standing balance-Leahy Scale: Poor Standing balance comment: using rw                             Pertinent Vitals/Pain Pain Assessment: Faces Faces Pain Scale: Hurts little more Pain Location: Rt hip Pain Descriptors / Indicators: Heaviness;Aching Pain Intervention(s): Monitored during session    Home Living Family/patient expects to be discharged to:: Skilled nursing facility Living Arrangements: Alone                    Prior Function Level of Independence: Independent with assistive device(s)         Comments: using rollator     Hand Dominance        Extremity/Trunk Assessment   Upper Extremity Assessment: Generalized weakness             RLE Deficits / Details: mod assist to move to edge of bed.        Communication   Communication: HOH  Cognition Arousal/Alertness: Awake/alert Behavior During Therapy: WFL for tasks assessed/performed Overall Cognitive Status: Impaired/Different from baseline Area of Impairment: Memory     Memory: Decreased recall of precautions;Decreased short-term memory  General Comments      Exercises        Assessment/Plan    PT Assessment Patient needs continued PT services  PT Diagnosis Difficulty walking;Generalized weakness   PT Problem List Decreased strength;Decreased range of motion;Decreased activity tolerance;Decreased balance;Decreased mobility;Decreased knowledge of use of DME  PT Treatment Interventions DME instruction;Gait training;Functional mobility training;Therapeutic activities;Therapeutic exercise;Balance  training;Stair training;Patient/family education   PT Goals (Current goals can be found in the Care Plan section) Acute Rehab PT Goals Patient Stated Goal: be able to walk again PT Goal Formulation: With patient Time For Goal Achievement: 03/02/16 Potential to Achieve Goals: Good    Frequency 7X/week   Barriers to discharge        Co-evaluation               End of Session Equipment Utilized During Treatment: Gait belt Activity Tolerance: Patient tolerated treatment well Patient left: in chair;with call bell/phone within reach;with family/visitor present Nurse Communication: Mobility status;Precautions;Weight bearing status    Functional Assessment Tool Used: clinical judgment Functional Limitation: Mobility: Walking and moving around Mobility: Walking and Moving Around Current Status [phone](G8978): At least 60 percent but less than 80 percent impaired, limited or restricted Mobility: Walking and Moving Around Goal Status [phone](G8979): At least 40 percent but less than 60 percent impaired, limited or restricted    Time: 1110-1201 PT Time Calculation (min) (ACUTE ONLY): 51 min   Charges:   PT Evaluation $PT Eval Moderate Complexity: 1 Procedure PT Treatments $Therapeutic Activity: 38-52 mins   PT G Codes:   PT G-Codes **NOT FOR INPATIENT CLASS** Functional Assessment Tool Used: clinical judgment Functional Limitation: Mobility: Walking and moving around Mobility: Walking and Moving Around Current Status ([phone]): At least 60 percent but less than 80 percent impaired, limited or restricted Mobility: Walking and Moving Around Goal Status [phone](G8979): At least 40 percent but less than 60 percent impaired, limited or restricted    Christiane HaBenjamin J. Doshia Dalia, PT, CSCS Pager [phone] Office [phone]  02/17/2016, 1:35 PM

## 2016-02-17 NOTE — Progress Notes (Signed)
She is doing well. Pain well controlled    Plan for dispo to her facility when bed available   Milinda Sweeney D

## 2016-02-17 NOTE — [place] FL2 (Signed)
[place] MEDICAID FL2 LEVEL OF CARE SCREENING TOOL     IDENTIFICATION  Patient Name: Candice Willis Birthdate: [birth year] Sex: female Admission Date (Current Location): 02/16/2016  [facility] [place]   Facility and Address:  [facility], [address], [place], [place] [address]      Provider Number: [id number]  Attending Physician Name and Address:  Sheral Apleyimothy D Murphy, MD  Relative Name and Phone Number:       Current Level of Care: Hospital Recommended Level of Care: Skilled Nursing Facility Prior Approval Number:    Date Approved/Denied:   PASRR Number: [id number]  Discharge Plan: SNF    Current Diagnoses: Patient Active Problem List   Diagnosis Date Noted  . PVC's (premature ventricular contractions) 01/05/2016  . Pressure ulcer of right buttock 09/16/2015  . Postoperative anemia due to acute blood loss 09/13/2015  . Hyponatremia 09/13/2015  . PAF (paroxysmal atrial fibrillation) (HCC) 09/11/2015  . Fall 09/10/2015  . Dizziness 09/10/2015  . Hip fracture, right (HCC) 09/10/2015  . Closed right hip fracture (HCC) 09/10/2015  . Hip fracture (HCC) 09/10/2015  . Diarrhea 09/10/2015  . Leg pain, right 06/16/2015  . Stomatitis 10/27/2014  . Vitamin D deficiency 10/24/2014  . Memory deficit   . Ulcer of hard palate   . Pain in right foot   . Thyroid nodule   . Pelvis fracture, right (HCC) 10/20/2014  . Hyperlipidemia 10/20/2014  . Essential hypertension 10/20/2014  . Osteoporosis 10/20/2014  . Constipation 10/20/2014    Orientation RESPIRATION BLADDER Height & Weight     Self, Time, Situation, Place  O2 Continent Weight: 116 lb (52.617 kg) Height:  5\' 2"  (157.5 cm)  BEHAVIORAL SYMPTOMS/MOOD NEUROLOGICAL BOWEL NUTRITION STATUS      Continent Diet (Please see discharge)  AMBULATORY STATUS COMMUNICATION OF NEEDS Skin    (Did not ambulate with PT) Verbally Surgical wounds                       Personal  Care Assistance Level of Assistance  Bathing, Feeding, Dressing Bathing Assistance: Limited assistance Feeding assistance: Limited assistance Dressing Assistance: Limited assistance     Functional Limitations Info             SPECIAL CARE FACTORS FREQUENCY  PT (By licensed PT), OT (By licensed OT)                    Contractures      Additional Factors Info  Allergies   Allergies Info: Penicillins           Current Medications (02/17/2016):  This is the current hospital active medication list Current Facility-Administered Medications  Medication Dose Route Frequency Provider Last Rate Last Dose  . acetaminophen (TYLENOL) tablet 650 mg  650 mg Oral Q6H PRN Sheral Apleyimothy D Murphy, MD       Or  . acetaminophen (TYLENOL) suppository 650 mg  650 mg Rectal Q6H PRN Sheral Apleyimothy D Murphy, MD      . aspirin EC tablet 325 mg  325 mg Oral Q breakfast Sheral Apleyimothy D Murphy, MD   325 mg at 02/17/16 0815  . atorvastatin (LIPITOR) tablet 20 mg  20 mg Oral q1800 Sheral Apleyimothy D Murphy, MD   20 mg at 02/16/16 1834  . dextrose 5 %-0.45 % sodium chloride infusion   Intravenous Continuous Sheral Apleyimothy D Murphy, MD 75 mL/hr at 02/17/16 0558    . docusate sodium (COLACE) capsule 100 mg  100 mg Oral BID Sheral Apley, MD   100 mg at 02/17/16 1059  . ferrous gluconate (FERGON) tablet 324 mg  324 mg Oral Q breakfast Sheral Apley, MD   324 mg at 02/17/16 0816  . HYDROcodone-acetaminophen (NORCO/VICODIN) 5-325 MG per tablet 1-2 tablet  1-2 tablet Oral Q4H PRN Sheral Apley, MD   1 tablet at 02/17/16 1058  . meclizine (ANTIVERT) tablet 12.5 mg  12.5 mg Oral TID PRN Sheral Apley, MD      . menthol-cetylpyridinium (CEPACOL) lozenge 3 mg  1 lozenge Oral PRN Sheral Apley, MD       Or  . phenol (CHLORASEPTIC) mouth spray 1 spray  1 spray Mouth/Throat PRN Sheral Apley, MD      . metoCLOPramide (REGLAN) tablet 5-10 mg  5-10 mg Oral Q8H PRN Sheral Apley, MD       Or  . metoCLOPramide (REGLAN) injection  5-10 mg  5-10 mg Intravenous Q8H PRN Sheral Apley, MD      . morphine 2 MG/ML injection 1 mg  1 mg Intravenous Q2H PRN Sheral Apley, MD      . multivitamin (PROSIGHT) tablet 2 tablet  2 tablet Oral Daily Sheral Apley, MD   2 tablet at 02/17/16 1058  . ondansetron (ZOFRAN) tablet 4 mg  4 mg Oral Q6H PRN Sheral Apley, MD       Or  . ondansetron [facility]) injection 4 mg  4 mg Intravenous Q6H PRN Sheral Apley, MD      . polyethylene glycol (MIRALAX / GLYCOLAX) packet 17 g  17 g Oral Daily PRN Sheral Apley, MD         Discharge Medications: Please see discharge summary for a list of discharge medications.  Relevant Imaging Results:  Relevant Lab Results:   Additional Information SSN: [id number]  [name], [place]

## 2016-02-18 MED ORDER — ONDANSETRON HCL 4 MG PO TABS: 4.0000 mg | ORAL_TABLET | Freq: Three times a day (TID) | ORAL | Status: DC | PRN

## 2016-02-18 MED ORDER — ASPIRIN EC 325 MG PO TBEC: 325.0000 mg | DELAYED_RELEASE_TABLET | Freq: Every day | ORAL | Status: DC

## 2016-02-18 MED ORDER — HYDROCODONE-ACETAMINOPHEN 5-325 MG PO TABS: 1.0000 | ORAL_TABLET | Freq: Four times a day (QID) | ORAL | Status: DC | PRN

## 2016-02-18 NOTE — Discharge Summary (Signed)
Patient ID: Candice Willis Arana MRN: [id number] DOB/AGE: 80-22-1925 80 y.o.  Admit date: 02/16/2016 Discharge date: 02/18/2016  Admission Diagnoses:  Active Problems:   Hip fracture [facility](HCC)   Discharge Diagnoses:  Same  Past Medical History  Diagnosis Date  . Hyperlipidemia   . Hypertension   . Osteoporosis   . Macular degeneration   . Paroxysmal atrial fibrillation (HCC)   . Arteriosclerotic cardiovascular disease   . Palpitations   . Constipation   . Memory deficit   . Ulcer of hard palate   . Pain in right foot   . Thyroid nodule     Status post surgery  . Fracture of multiple pubic rami (HCC) 10/15/14    Right inferior and superior pubic rami  . Degenerative joint disease of right hip 10/15/14  . Dysrhythmia     Paroxysmal Atrial Fibrillation  . Vertigo   . HOH (hard of hearing)     Wears bilateral hearing aids    Surgeries: Procedure(Willis): ARTHROPLASTY BIPOLAR HIP (HEMIARTHROPLASTY) HARDWARE REMOVAL RT HIP on 02/16/2016   Consultants:    Discharged Condition: Improved  Hospital Course: Candice Willis Rosiak is an 82 y.o. female who was admitted 02/16/2016 for operative treatment of hip fracture. Patient has severe unremitting pain that affects sleep, daily activities, and work/hobbies. After pre-op clearance the patient was taken to the operating room on 02/16/2016 and underwent  Procedure(Willis): ARTHROPLASTY BIPOLAR HIP (HEMIARTHROPLASTY) HARDWARE REMOVAL RT HIP.    Patient was given perioperative antibiotics: Anti-infectives    Start     Dose/Rate Route Frequency Ordered Stop   02/16/16 2000  vancomycin (VANCOCIN) IVPB 1000 mg/200 mL premix     1,000 mg 200 mL/hr over 60 Minutes Intravenous Every 12 hours 02/16/16 1343 02/16/16 2351   02/16/16 0700  vancomycin (VANCOCIN) IVPB 1000 mg/200 mL premix     1,000 mg 200 mL/hr over 60 Minutes Intravenous To ShortStay Surgical 02/15/16 1446 02/16/16 0815       Patient was given sequential compression devices, early  ambulation, and chemoprophylaxis to prevent DVT.  Patient benefited maximally from hospital stay and there were no complications.    Recent vital signs: Patient Vitals for the past 24 hrs:  BP Temp Temp src Pulse Resp SpO2  02/18/16 0300 (!) 170/40 mmHg 98.1 F (36.7 C) Oral 66 16 97 %  02/17/16 2200 (!) 179/48 mmHg 98.2 F (36.8 C) Oral (!) 59 15 97 %  02/17/16 1300 (!) 145/50 mmHg 97.9 F (36.6 C) - 84 14 100 %     Recent laboratory studies: No results for input(Willis): WBC, HGB, HCT, PLT, NA, K, CL, CO2, BUN, CREATININE, GLUCOSE, INR, CALCIUM in the last 72 hours.  Invalid input(Willis): PT, 2   Discharge Medications:     Medication List    STOP taking these medications        acetaminophen 500 MG tablet  Commonly known as:  TYLENOL     aspirin 81 MG tablet  Replaced by:  aspirin EC 325 MG tablet     KRILL OIL PLUS Caps      TAKE these medications        aspirin EC 325 MG tablet  Take 1 tablet (325 mg total) by mouth daily.     atorvastatin 20 MG tablet  Commonly known as:  LIPITOR  Take 20 mg by mouth daily.     Biotin 10 MG Caps  Take 10 mg by mouth daily.     docusate sodium 100 MG capsule  Commonly  known as:  COLACE  Take 1 capsule (100 mg total) by mouth daily.     ferrous gluconate 324 MG tablet  Commonly known as:  FERGON  Take 1 tablet (324 mg total) by mouth daily with breakfast.     HYDROcodone-acetaminophen 5-325 MG tablet  Commonly known as:  NORCO  Take 1 tablet by mouth every 6 (six) hours as needed for moderate pain.     lactose free nutrition Liqd  Take 237 mLs by mouth daily. Drinks half in the morning, other half in the evening     meclizine 12.5 MG tablet  Commonly known as:  ANTIVERT  Take 1 tablet (12.5 mg total) by mouth 3 (three) times daily as needed for dizziness.     Melatonin 3 MG Caps  Take 1 capsule by mouth at bedtime as needed.     multivitamin with minerals Tabs tablet  Take 1 tablet by mouth daily.     ondansetron 4 MG  tablet  Commonly known as:  ZOFRAN  Take 1 tablet (4 mg total) by mouth every 8 (eight) hours as needed for nausea or vomiting.     polyethylene glycol packet  Commonly known as:  MIRALAX / GLYCOLAX  Take 17 g by mouth daily as needed for mild constipation.     PRESERVISION/LUTEIN Caps  Take 2 capsules by mouth daily.        Diagnostic Studies: Dg Pelvis Portable  02/16/2016  CLINICAL DATA:  AP portable supine view of the right hip and lower pelvis. EXAM: PORTABLE PELVIS 1-2 VIEWS COMPARISON:  Similar postoperative portable view of September 06, 2015 FINDINGS: The patient has undergone removal of a telescoping screw and intra medullary rod and subsequent placement of a total right hip joint prosthesis. Radiographic positioning of the prosthetic components is good. There are chronic changes of the right superior and inferior pubic rami consistent with previous fractures with residual deformity. IMPRESSION: No evidence of immediate postprocedure complication following right total hip prosthesis placement. Electronically Signed   By: David  [place] M.D.   On: 02/16/2016 10:16    Disposition: 03-Skilled Nursing Facility        Follow-up Information    Follow up with MURPHY, TIMOTHY D, MD In 1 week.   Specialty:  Orthopedic Surgery   Contact information:   [address] [place] [address] [phone]        Signed: Otilio Saber 02/18/2016, 7:27 AM

## 2016-02-18 NOTE — Progress Notes (Signed)
Patient was found sitting on the edge of bed by another nurse naked. Patient was trying to get up to use the bathroom. Patient had voided on the floor, gown was wet and the wound vac was starting to come away from the dressing. Wound vac dressing was reinforced. Wound vac maintaining pressure and no air leak was noted. There has not been any drainage in the cannister since it was placed after surgery. Patient seems confused and has hard time collecting her thoughts and answers when questions are asked. Patient has been alert and oriented since admission and this is the first time any confusion has been noted. This RN will hold narcotic pain medicines in case this is contributing to her confusion. Will give patient tylenol if needed. Patient is not complaining of any pain at this time. Will continue to monitor.

## 2016-02-18 NOTE — Progress Notes (Signed)
Report called to [facility] Sk[facility] Nursing Department, report given to [employer] Rubber[employer]Chris Church.  Prescription given to Ptar to provide to Nursing Home.  Friend/Partner present.  IV removed.  2 RNs (Waynetta SandyBeth, RN and Barton Creekonstance, Charity fundraiserN) assisted to Doctor, general practicestretcher for transport.  All belongings (including rolling walker) taken by friend.  Hearing aid in at the time of discharge.  No questions at the time of discharge.

## 2016-02-18 NOTE — Evaluation (Signed)
Occupational Therapy Evaluation Patient Details Name: Candice Willis MRN: [id number] DOB: 04/06/1924 Today's Date: 02/18/2016    History of Present Illness 82 y.o. female, who fell approximately 1 year ago with IM nail placement following. The patient states that she had continued pain and is now s/p ARTHROPLASTY BIPOLAR HIP (HEMIARTHROPLASTY), HARDWARE REMOVAL RT HIP. PMH: hypertension, osteoporosis, macular degeneration, pubic rami fracture, vertigo, IM nail 2016.    Clinical Impression   Patient presenting with decreased ADL and functional mobility independence secondary to above. Patient independent PTA and living in ILF. Patient currently functioning at an overall min to mod assist level. Patient will benefit from acute OT to increase overall independence in the areas of ADLs, functional mobility, and overall safety in order to safely discharge to venue listed below.     Follow Up Recommendations  SNF;Supervision/Assistance - 24 hour    Equipment Recommendations  Other (comment) (TBD)    Recommendations for Other Services  None at this time   Precautions / Restrictions Precautions Precautions: Posterior Hip;Fall Precaution Comments: HEP and precautions (provided and posted).  Required Braces or Orthoses: Other Brace/Splint Other Brace/Splint: abductor wedge  Restrictions Weight Bearing Restrictions: Yes RLE Weight Bearing: Weight bearing as tolerated    Mobility Bed Mobility Overal bed mobility: Needs Assistance Bed Mobility: Supine to Sit     Supine to sit: Min guard     General bed mobility comments: HOB slightly raised, use of bed rails. Cues for safety, technique, sequencing, and precautions   Transfers Overall transfer level: Needs assistance Equipment used: Rolling walker (2 wheeled) Transfers: Sit to/from Stand Sit to Stand: Min assist Stand pivot transfers: Min assist       General transfer comment: heavy min assist, pt transfered EOB to BSC and BSC to  recliner using RW. Multimodal cueing for safety, sequencing, technique, and precautions.     Balance Overall balance assessment: Needs assistance Sitting-balance support: No upper extremity supported;Feet supported Sitting balance-Leahy Scale: Fair     Standing balance support: Bilateral upper extremity supported;During functional activity Standing balance-Leahy Scale: Poor Standing balance comment: using RW    ADL Overall ADL's : Needs assistance/impaired Eating/Feeding: Set up;Sitting   Grooming: Set up;Sitting   Upper Body Bathing: Minimal assitance;Sitting   Lower Body Bathing: Maximal assistance;Sit to/from stand   Upper Body Dressing : Minimal assistance;Sitting   Lower Body Dressing: Maximal assistance;Sit to/from stand   Toilet Transfer: Moderate assistance;Cueing for safety;RW;Stand-pivot;BSC   Toileting- Clothing Manipulation and Hygiene: Minimal assistance;Sit to/from Nurse, children'sstand     Tub/Shower Transfer Details (indicate cue type and reason): did not occur, safety concern at this time   General ADL Comments: Pt will benefit from AE to increase independence and safety with LB ADLs    Vision Vision Assessment?: No apparent visual deficits          Pertinent Vitals/Pain Pain Assessment: Faces Faces Pain Scale: Hurts even more Pain Location: Rt hip with mobility and movement  Pain Descriptors / Indicators: Aching;Sore;Discomfort;Dull Pain Intervention(s): Limited activity within patient's tolerance;Monitored during session;Repositioned     Hand Dominance Right   Extremity/Trunk Assessment Upper Extremity Assessment Upper Extremity Assessment: Generalized weakness   Lower Extremity Assessment Lower Extremity Assessment: Defer to PT evaluation   Cervical / Trunk Assessment Cervical / Trunk Assessment: Normal   Communication Communication Communication: HOH   Cognition Arousal/Alertness: Awake/alert Behavior During Therapy: WFL for tasks  assessed/performed Overall Cognitive Status: Impaired/Different from baseline Area of Impairment: Memory;Awareness     Memory: Decreased recall of precautions;Decreased short-term  memory     Awareness: Intellectual   General Comments: Pt confused about what happened last night and having difficulty with time, she though clock in room was wrong               Home Living Family/patient expects to be discharged to:: Skilled nursing facility Living Arrangements: Alone Additional Comments: Living in ILF PTA      Prior Functioning/Environment Level of Independence: Independent with assistive device(s)        Comments: using rollator, states she was independent with ADLs and went to dining hall for most meals     OT Diagnosis: Generalized weakness;Acute pain   OT Problem List: Decreased strength;Decreased range of motion;Decreased activity tolerance;Impaired balance (sitting and/or standing);Decreased cognition;Decreased safety awareness;Decreased knowledge of use of DME or AE;Decreased knowledge of precautions;Pain   OT Treatment/Interventions: Self-care/ADL training;Therapeutic exercise;Energy conservation;DME and/or AE instruction;Therapeutic activities;Patient/family education;Balance training    OT Goals(Current goals can be found in the care plan section) Acute Rehab OT Goals Patient Stated Goal: none stated OT Goal Formulation: With patient Time For Goal Achievement: 03/03/16 Potential to Achieve Goals: Good ADL Goals Pt Will Perform Grooming: with set-up;sitting Pt Will Perform Lower Body Bathing: sit to/from stand;with min assist;with adaptive equipment Pt Will Perform Lower Body Dressing: with min assist;sit to/from stand;with adaptive equipment Pt Will Transfer to Toilet: with supervision;ambulating;bedside commode Additional ADL Goal #1: Pt will engage in functional mobility/ambulation using RW with supervision during ADL   OT Frequency: Min 2X/week   Barriers to  D/C: Decreased caregiver support   End of Session Equipment Utilized During Treatment: Gait belt;Rolling walker Nurse Communication: Other (comment) (skin abrasion on hip)  Activity Tolerance: Patient tolerated treatment well Patient left: in chair;with call bell/phone within reach;with chair alarm set   Time: 1610-9604 OT Time Calculation (min): 26 min Charges:  OT General Charges $OT Visit: 1 Procedure OT Evaluation $OT Eval Moderate Complexity: 1 Procedure OT Treatments $Self Care/Home Management : 8-22 mins  Edwin Cap , MS, OTR/L, CLT Pager: [phone]  02/18/2016, 10:00 AM

## 2016-02-18 NOTE — Clinical Social Work Note (Signed)
Clinical Social Work Assessment  Patient Details  Name: Candice Willis MRN: [id number] Date of Birth: [birth year]  Date of referral:  02/18/16               Reason for consult:  Facility Placement, Discharge Planning                Permission sought to share information with:  Facility Medical sales representativeContact Representative Permission granted to share information::  Yes, Verbal Permission Granted  Name::     Fannie KneeSue  Agency::  [facility]  Relationship::  partner  Contact Information:  [phone]  Housing/Transportation Living arrangements for the past 2 months:  Assisted Living Facility Source of Information:  Patient, Other (Comment Required) (significant other) Patient Interpreter Needed:  None Criminal Activity/Legal Involvement Pertinent to Current Situation/Hospitalization:  No - Comment as needed Significant Relationships:  Significant Other Lives with:  Facility Resident Do you feel safe going back to the place where you live?  Yes Need for family participation in patient care:  Yes (Comment) (Patient's significant other involved in patient's care.)  Care giving concerns:  Patient nor patient's significant other expressed concern at this time.   Social Worker assessment / plan:  CSW [facility] CSW spoke with patient and patient's significant other regarding discharge plan. Patient and patient's significant other [facility]. CSW to continue to follow and assist with discharge planning needs.  Employment status:  Retired Medical illustratornsurance information:  Managed Medicare ([employer] Medicare) PT Recommendations:  Skilled Nursing Facility Information / Referral to community resources:  Skilled Nursing Facility  Patient/Family's Response to care:  Patient and patient's significant other expressed no concerns at this time.  Patient/Family's Understanding of and Emotional Response to Diagnosis, Current Treatment, and  Prognosis:  Patient and patient's significant other expressed no concerns at this time.  Emotional Assessment Appearance:  Appears stated age Attitude/Demeanor/Rapport:  Other (Appropriate) Affect (typically observed):  Accepting, Appropriate, Pleasant Orientation:  Oriented to Self, Oriented to Place Alcohol / Substance use:  Not Applicable Psych involvement (Current and /or in the community):  No (Comment) (Not appropriate on this admission.)  Discharge Needs  Concerns to be addressed:  No discharge needs identified Readmission within the last 30 days:  No Current discharge risk:  None Barriers to Discharge:  No Barriers Identified   Rod MaeVaughn, Daire Okimoto S, LCSW 02/18/2016, 12:08 PM [phone]

## 2016-02-18 NOTE — Progress Notes (Signed)
PT Cancellation Note  Patient Details Name: Candice Willis MRN: [id number] DOB: Nov 21, 1924   Cancelled Treatment:    Reason Eval/Treat Not Completed: Pt preparing to D/C to SNF.    Christiane HaBenjamin J. Antoinne Spadaccini, PT, CSCS Pager [phone] Office [phone]  02/18/2016, 3:53 PM

## 2016-02-18 NOTE — Clinical Social Work Note (Signed)
[facility]. Patient and patient's significant other updated regarding discharge. Patient to be transported via EMS (approximate time 2pm)  [facility]: [phone]  Marcelline Deistmily Kyriaki Moder, LCSW [phone] Orthopedics: [phone] Surgical: [phone]

## 2016-02-19 ENCOUNTER — Non-Acute Institutional Stay (SKILLED_NURSING_FACILITY): Payer: Medicare Other | Admitting: Nurse Practitioner

## 2016-02-19 ENCOUNTER — Encounter: Payer: Self-pay | Admitting: Nurse Practitioner

## 2016-02-19 DIAGNOSIS — I480 Paroxysmal atrial fibrillation: Secondary | ICD-10-CM

## 2016-02-19 DIAGNOSIS — E871 Hypo-osmolality and hyponatremia: Secondary | ICD-10-CM | POA: Diagnosis not present

## 2016-02-19 DIAGNOSIS — D62 Acute posthemorrhagic anemia: Secondary | ICD-10-CM | POA: Diagnosis not present

## 2016-02-19 DIAGNOSIS — K5900 Constipation, unspecified: Secondary | ICD-10-CM

## 2016-02-19 NOTE — Progress Notes (Signed)
Patient ID: Candice Willis, female   DOB: 05/27/1924, 82 y.o.   MRN: [id number]  Location:  [facility] Room Number: 70 Place of Service:  SNF (31) Provider: Arna Snipe Mast NP  Paulino Rily, MD  Patient Care Team: Knox Royalty, MD as PCP - General (Family Medicine) Venita Lick, MD as Consulting Physician (Orthopedic Surgery) Marykay Lex, MD as Consulting Physician (Cardiology)  Extended Emergency Contact Information Primary Emergency Contact: Dineen Kid [place] of [place] Phone: [phone] Relation: None Secondary Emergency Contact: Everardo Beals, [place] [address] Darden Amber of [place] Home Phone: [phone] Mobile Phone: [phone] Relation: Friend  Code Status:  DNR Goals of care: Advanced Directive information Advanced Directives 02/19/2016  Does patient have an advance directive? Yes  Type of Advance Directive Healthcare Power of Attorney  Does patient want to make changes to advanced directive? No - Patient declined  Copy of advanced directive(s) in chart? No - copy requested     Chief Complaint  Patient presents with  . Medical Management of Chronic Issues    Routine Visit    HPI:  Pt is a 82 y.o. female seen today for a hospital f/u s/p admission from 02/16/16 to 02/18/16 for ARTHROPLASTY BIPOLAR HIP (HEMIARTHROPLASTY) HARDWARE REMOVAL RT HIP.   Past Medical History  Diagnosis Date  . Hyperlipidemia   . Hypertension   . Osteoporosis   . Macular degeneration   . Paroxysmal atrial fibrillation (HCC)   . Arteriosclerotic cardiovascular disease   . Palpitations   . Constipation   . Memory deficit   . Ulcer of hard palate   . Pain in right foot   . Thyroid nodule     Status post surgery  . Fracture of multiple pubic rami (HCC) 10/15/14    Right inferior and superior pubic rami  . Degenerative joint disease of right hip 10/15/14  . Dysrhythmia     Paroxysmal Atrial Fibrillation  . Vertigo     . HOH (hard of hearing)     Wears bilateral hearing aids   Past Surgical History  Procedure Laterality Date  . Appendectomy  1940's  . Abdominal hysterectomy  1968  . Foot surgery Left   . Knee surgery    . Intramedullary (im) nail intertrochanteric Right 09/11/2015    Procedure: INTRAMEDULLARY (IM) NAIL INTERTROCHANTRIC;  Surgeon: Sheral Apley, MD;  Location: [facility] OR;  Service: Orthopedics;  Laterality: Right;  . Colonoscopy    . Hip arthroplasty Right 02/16/2016    Procedure: ARTHROPLASTY BIPOLAR HIP (HEMIARTHROPLASTY);  Surgeon: Sheral Apley, MD;  Location[facility] OR;  Service: Orthopedics;  Laterality: Right;  . Hardware removal Right 02/16/2016    Procedure: HARDWARE REMOVAL RT HIP;  Surgeon: Sheral Apley, MD;  Location: [facility] OR;  Service: Orthopedics;  Laterality: Right;    Allergies  Allergen Reactions  . Penicillins Other (See Comments)    Unknown       Medication List       This list is accurate as of: 02/19/16  4:03 PM.  Always use your most recent med list.               aspirin EC 325 MG tablet  Take 1 tablet (325 mg total) by mouth daily.     atorvastatin 20 MG tablet  Commonly known as:  LIPITOR  Take 20 mg by mouth daily.     Biotin 10 MG Caps  Take 10 mg  by mouth daily.     docusate sodium 100 MG capsule  Commonly known as:  COLACE  Take 1 capsule (100 mg total) by mouth daily.     ferrous gluconate 324 MG tablet  Commonly known as:  FERGON  Take 1 tablet (324 mg total) by mouth daily with breakfast.     HYDROcodone-acetaminophen 5-325 MG tablet  Commonly known as:  NORCO  Take 1 tablet by mouth every 6 (six) hours as needed for moderate pain.     lactose free nutrition Liqd  Take 237 mLs by mouth daily. Drinks half in the morning, other half in the evening     meclizine 12.5 MG tablet  Commonly known as:  ANTIVERT  Take 1 tablet (12.5 mg total) by mouth 3 (three) times daily as needed for dizziness.     Melatonin 3 MG Caps  Take 1  capsule by mouth at bedtime as needed.     multivitamin with minerals Tabs tablet  Take 1 tablet by mouth daily.     ondansetron 4 MG tablet  Commonly known as:  ZOFRAN  Take 1 tablet (4 mg total) by mouth every 8 (eight) hours as needed for nausea or vomiting.     polyethylene glycol packet  Commonly known as:  MIRALAX / GLYCOLAX  Take 17 g by mouth daily as needed for mild constipation.     PRESERVISION/LUTEIN Caps  Take 2 capsules by mouth daily.        Review of Systems  Constitutional: Negative for fever, chills and diaphoresis.  HENT: Positive for hearing loss. Negative for congestion, ear discharge, ear pain, nosebleeds and sore throat.        Roof of mouth soreness-resolved  Eyes: Negative for photophobia and pain.  Respiratory: Negative for cough, shortness of breath, wheezing and stridor.   Cardiovascular: Negative for chest pain, palpitations and leg swelling.       RLE  Gastrointestinal: Negative for nausea, vomiting, abdominal pain, diarrhea and constipation.  Genitourinary: Negative for dysuria, urgency and frequency.  Musculoskeletal: Negative for myalgias, back pain and neck pain.       Right hip pain with weight bearing, improved.   Skin: Negative for rash.       Right buttock stage II pressure ulcer healed, mild erythema in the area. Right hip surgical incision healed.   Neurological: Negative for dizziness, tremors, seizures, weakness and headaches.  Hematological: Does not bruise/bleed easily.  Psychiatric/Behavioral: Negative for suicidal ideas and hallucinations. The patient is not nervous/anxious.        Mildly confusion of time and place.     Immunization History  Administered Date(s) Administered  . Tdap 10/07/2012   Pertinent  Health Maintenance Due  Topic Date Due  . PNA vac Low Risk Adult (1 of 2 - PCV13) 04/15/1991  . INFLUENZA VACCINE  06/22/2015  . DEXA SCAN  Completed   Fall Risk  10/24/2014  Falls in the past year? Yes  Number falls  in past yr: 1  Injury with Fall? Yes  Risk Factor Category  High Fall Risk  Risk for fall due to : History of fall(s)   Functional Status Survey:    Filed Vitals:   02/19/16 1119  BP: 138/82  Pulse: 68  Temp: 97.4 F (36.3 C)  TempSrc: Oral  Resp: 18  Height:  (1.575 m)  Weight: 116 lb (52.617 kg)   Body mass index is 21.21 kg/(m^2). Physical Exam  Constitutional: She appears well-developed and well-nourished. No  distress.  HENT:  Head: Normocephalic and atraumatic.  Right Ear: External ear normal.  Left Ear: External ear normal.  Nose: Nose normal.  Mouth/Throat: Oropharynx is clear and moist. No oropharyngeal exudate.  Eyes: Conjunctivae and EOM are normal. Pupils are equal, round, and reactive to light. Right eye exhibits no discharge. Left eye exhibits no discharge. No scleral icterus.  Neck: Normal range of motion. Neck supple. No JVD present. No tracheal deviation present. No thyromegaly present.  Cardiovascular: Normal rate, regular rhythm and intact distal pulses.  Exam reveals no gallop and no friction rub.   No murmur heard. Pulmonary/Chest: Effort normal and breath sounds normal. No stridor. No respiratory distress. She has no wheezes. She has no rales. She exhibits no tenderness.  Abdominal: Soft. Bowel sounds are normal. She exhibits no distension and no mass. There is no tenderness. There is no rebound and no guarding.  Musculoskeletal: She exhibits tenderness. She exhibits no edema.  Right hip ROM with pain and weight bearing, improved.   Lymphadenopathy:    She has no cervical adenopathy.  Neurological: She is alert. She displays normal reflexes. No cranial nerve deficit. She exhibits normal muscle tone. Coordination normal.  Skin: Skin is warm and dry. No rash noted. She is not diaphoretic. No erythema. No pallor.  Right buttock stage II pressure ulcer healed, mild erythema in the area. Right hip surgical incisions healed.    Psychiatric: She has a  normal mood and affect. Her behavior is normal. Judgment and thought content normal.    Labs reviewed:  Recent Labs  09/13/15 0510 09/14/15 0328 09/18/15 09/22/15 02/05/16 1333  NA 129* 129* 128* 129* 137  K 3.8 3.8 5.2 4.6 4.1  CL 99* 97*  --   --  102  CO2 25 26  --   --  23  GLUCOSE 118* 112*  --   --  87  BUN 14 15 16 10 15   CREATININE 0.67 0.59 0.6 0.7 0.78  CALCIUM 8.2* 8.1*  --   --  9.9    Recent Labs  09/18/15 02/05/16 1333  AST 61* 30  ALT 48* 26  ALKPHOS 127* 90  BILITOT  --  0.7  PROT  --  7.2  ALBUMIN  --  4.2    Recent Labs  09/10/15 1835 09/13/15 0510 09/14/15 0328 09/18/15 09/22/15 02/05/16 1333  WBC 4.5 12.0* 9.3 11.1 8.2 7.1  NEUTROABS 3.2  --   --   --   --  5.0  HGB 13.0 8.1* 7.8* 8.7* 8.5* 12.7  HCT 37.0 23.2* 22.2* 25* 24* 37.7  MCV 88.7 88.5 88.4  --   --  89.5  PLT 223 163 180 436* 535* 300   Lab Results  Component Value Date   TSH 1.73 09/18/2015   No results found for: HGBA1C Lab Results  Component Value Date   CHOL 92 09/22/2015   HDL 40 09/22/2015   LDLCALC 41 09/22/2015   TRIG 57 09/22/2015    Significant Diagnostic Results in last 30 days:  No results found.  Assessment/Plan  Hip fracture, right (HCC) ARTHROPLASTY BIPOLAR HIP (HEMIARTHROPLASTY) HARDWARE REMOVAL RT HIP.  Prn Norco q6h prn  Constipation Stable, continue Colace 100mg  daily, prn MiraLax.   Postoperative anemia due to acute blood loss Continue Fe 324mg  qd, update CBC, last Hgb 12.7 02/05/16  PAF (paroxysmal atrial fibrillation) (HCC) Heart rate is in control. Off meds.   Hyponatremia Last serum Na 137, update CMP    Family/ staff Communication:  SNF for rehab, [place]   Labs/tests ordered:  CBC CMP

## 2016-02-19 NOTE — Assessment & Plan Note (Signed)
ARTHROPLASTY BIPOLAR HIP (HEMIARTHROPLASTY) HARDWARE REMOVAL RT HIP.  Prn Norco q6h prn

## 2016-02-19 NOTE — Assessment & Plan Note (Signed)
Continue Fe 324mg  qd, update CBC, last Hgb 12.7 02/05/16

## 2016-02-19 NOTE — Assessment & Plan Note (Signed)
Last serum Na 137, update CMP

## 2016-02-19 NOTE — Assessment & Plan Note (Signed)
Heart rate is in control. Off meds.

## 2016-02-19 NOTE — Assessment & Plan Note (Signed)
Stable, continue Colace 100mg  daily, prn MiraLax.

## 2016-02-22 ENCOUNTER — Non-Acute Institutional Stay (SKILLED_NURSING_FACILITY): Payer: Medicare Other | Admitting: Nurse Practitioner

## 2016-02-22 ENCOUNTER — Encounter: Payer: Self-pay | Admitting: Nurse Practitioner

## 2016-02-22 DIAGNOSIS — K1379 Other lesions of oral mucosa: Secondary | ICD-10-CM

## 2016-02-22 DIAGNOSIS — D62 Acute posthemorrhagic anemia: Secondary | ICD-10-CM | POA: Diagnosis not present

## 2016-02-22 DIAGNOSIS — I480 Paroxysmal atrial fibrillation: Secondary | ICD-10-CM | POA: Diagnosis not present

## 2016-02-22 DIAGNOSIS — K5900 Constipation, unspecified: Secondary | ICD-10-CM | POA: Diagnosis not present

## 2016-02-22 DIAGNOSIS — E871 Hypo-osmolality and hyponatremia: Secondary | ICD-10-CM | POA: Diagnosis not present

## 2016-02-22 NOTE — Assessment & Plan Note (Signed)
Last serum Na 137, pending CmP

## 2016-02-22 NOTE — Progress Notes (Signed)
Patient ID: Candice Willis, female   DOB: 1924/01/22, 82 y.o.   MRN: [id number]  Location:  [facility] Room Number: 14 Place of Service:  SNF (31) Provider: Arna Snipe Javoni Lucken NP  Paulino Rily, MD  Patient Care Team: Knox Royalty, MD as PCP - General (Family Medicine) Venita Lick, MD as Consulting Physician (Orthopedic Surgery) Marykay Lex, MD as Consulting Physician (Cardiology)  Extended Emergency Contact Information Primary Emergency Contact: Dineen Kid [place] of [place] Phone: [phone] Relation: None Secondary Emergency Contact: Everardo Beals, [place] [address] D[place] Amber of [place] Home Phone: [phone] Mobile Phone: [phone] Relation: Friend  Code Status:  DNR Goals of care: Advanced Directive information Advanced Directives 02/22/2016  Does patient have an advance directive? Yes  Type of Advance Directive Healthcare Power of Attorney  Does patient want to make changes to advanced directive? No - Patient declined  Copy of advanced directive(s) in chart? Yes     Chief Complaint  Patient presents with  . Oral Pain    sore mouth    HPI:  Pt is a 82 y.o. female seen today for evaluation of the sore area on the roof of mouth, noted erythematous area about a quarter size, the patient stated it has been about 2-3 days.    02/16/16 to 02/18/16 for ARTHROPLASTY BIPOLAR HIP (HEMIARTHROPLASTY) HARDWARE REMOVAL RT HIP.   Past Medical History  Diagnosis Date  . Hyperlipidemia   . Hypertension   . Osteoporosis   . Macular degeneration   . Paroxysmal atrial fibrillation (HCC)   . Arteriosclerotic cardiovascular disease   . Palpitations   . Constipation   . Memory deficit   . Ulcer of hard palate   . Pain in right foot   . Thyroid nodule     Status post surgery  . Fracture of multiple pubic rami (HCC) 10/15/14    Right inferior and superior pubic rami  . Degenerative joint disease of right hip 10/15/14    . Dysrhythmia     Paroxysmal Atrial Fibrillation  . Vertigo   . HOH (hard of hearing)     Wears bilateral hearing aids   Past Surgical History  Procedure Laterality Date  . Appendectomy  1940's  . Abdominal hysterectomy  1968  . Foot surgery Left   . Knee surgery    . Intramedullary (im) nail intertrochanteric Right 09/11/2015    Procedure: INTRAMEDULLARY (IM) NAIL INTERTROCHANTRIC;  Surgeon: Sheral Apley, MD;  Location: [facility] OR;  Service: Orthopedics;  Laterality: Right;  . Colonoscopy    . Hip arthroplasty Right 02/16/2016    Procedure: ARTHROPLASTY BIPOLAR HIP (HEMIARTHROPLASTY);  Surgeon: Sheral Apley, MD;  Location: [facility] OR;  Service: Orthopedics;  Laterality: Right;  . Hardware removal Right 02/16/2016    Procedure: HARDWARE REMOVAL RT HIP;  Surgeon: Sheral Apley, MD;  Location: [facility] OR;  Service: Orthopedics;  Laterality: Right;    Allergies  Allergen Reactions  . Penicillins Other (See Comments)    Unknown       Medication List       This list is accurate as of: 02/22/16  3:47 PM.  Always use your most recent med list.               aspirin EC 325 MG tablet  Take 1 tablet (325 mg total) by mouth daily.     atorvastatin 20 MG tablet  Commonly known as:  LIPITOR  Take  20 mg by mouth daily.     Biotin 10 MG Caps  Take 10 mg by mouth daily.     docusate sodium 100 MG capsule  Commonly known as:  COLACE  Take 1 capsule (100 mg total) by mouth daily.     ferrous gluconate 324 MG tablet  Commonly known as:  FERGON  Take 1 tablet (324 mg total) by mouth daily with breakfast.     HYDROcodone-acetaminophen 5-325 MG tablet  Commonly known as:  NORCO  Take 1 tablet by mouth every 6 (six) hours as needed for moderate pain.     lactose free nutrition Liqd  Take 237 mLs by mouth daily. Drinks half in the morning, other half in the evening     meclizine 12.5 MG tablet  Commonly known as:  ANTIVERT  Take 1 tablet (12.5 mg total) by mouth 3 (three) times  daily as needed for dizziness.     Melatonin 3 MG Caps  Take 1 capsule by mouth at bedtime as needed.     multivitamin with minerals Tabs tablet  Take 1 tablet by mouth daily.     ondansetron 4 MG tablet  Commonly known as:  ZOFRAN  Take 1 tablet (4 mg total) by mouth every 8 (eight) hours as needed for nausea or vomiting.     polyethylene glycol packet  Commonly known as:  MIRALAX / GLYCOLAX  Take 17 g by mouth daily as needed for mild constipation.     PRESERVISION/LUTEIN Caps  Take 2 capsules by mouth daily.        Review of Systems  Constitutional: Negative for fever, chills and diaphoresis.  HENT: Positive for hearing loss. Negative for congestion, ear discharge, ear pain, nosebleeds and sore throat.        Roof of mouth soreness  Eyes: Negative for photophobia and pain.  Respiratory: Negative for cough, shortness of breath, wheezing and stridor.   Cardiovascular: Negative for chest pain, palpitations and leg swelling.       RLE  Gastrointestinal: Negative for nausea, vomiting, abdominal pain, diarrhea and constipation.  Genitourinary: Negative for dysuria, urgency and frequency.  Musculoskeletal: Negative for myalgias, back pain and neck pain.       Right hip pain with weight bearing, improved.   Skin: Negative for rash.       Right buttock stage II pressure ulcer healed, mild erythema in the area. Right hip surgical incision healed.   Neurological: Negative for dizziness, tremors, seizures, weakness and headaches.  Hematological: Does not bruise/bleed easily.  Psychiatric/Behavioral: Negative for suicidal ideas and hallucinations. The patient is not nervous/anxious.        Mildly confusion of time and place.     Immunization History  Administered Date(s) Administered  . PPD Test 02/18/2016  . Tdap 10/07/2012   Pertinent  Health Maintenance Due  Topic Date Due  . PNA vac Low Risk Adult (1 of 2 - PCV13) 04/15/1991  . INFLUENZA VACCINE  06/21/2016  . DEXA SCAN   Completed   Fall Risk  10/24/2014  Falls in the past year? Yes  Number falls in past yr: 1  Injury with Fall? Yes  Risk Factor Category  High Fall Risk  Risk for fall due to : History of fall(s)   Functional Status Survey:    Filed Vitals:   02/22/16 1206  BP: 126/68  Pulse: 72  Temp: 97.9 F (36.6 C)  TempSrc: Oral  Resp: 20  Height: 5\' 2"  (1.575 m)  Weight:  119 lb 6.4 oz (54.159 kg)   Body mass index is 21.83 kg/(m^2). Physical Exam  Constitutional: She appears well-developed and well-nourished. No distress.  HENT:  Head: Normocephalic and atraumatic.  Right Ear: External ear normal.  Left Ear: External ear normal.  Nose: Nose normal.  Mouth/Throat: Oropharynx is clear and moist. No oropharyngeal exudate.   the sore area on the roof of mouth, noted erythematous area about a quarter size, the patient stated it has been about 2-3 days.   Eyes: Conjunctivae and EOM are normal. Pupils are equal, round, and reactive to light. Right eye exhibits no discharge. Left eye exhibits no discharge. No scleral icterus.  Neck: Normal range of motion. Neck supple. No JVD present. No tracheal deviation present. No thyromegaly present.  Cardiovascular: Normal rate, regular rhythm and intact distal pulses.  Exam reveals no gallop and no friction rub.   No murmur heard. Pulmonary/Chest: Effort normal and breath sounds normal. No stridor. No respiratory distress. She has no wheezes. She has no rales. She exhibits no tenderness.  Abdominal: Soft. Bowel sounds are normal. She exhibits no distension and no mass. There is no tenderness. There is no rebound and no guarding.  Musculoskeletal: She exhibits tenderness. She exhibits no edema.  Right hip ROM with pain and weight bearing, improved.   Lymphadenopathy:    She has no cervical adenopathy.  Neurological: She is alert. She displays normal reflexes. No cranial nerve deficit. She exhibits normal muscle tone. Coordination normal.  Skin: Skin is  warm and dry. No rash noted. She is not diaphoretic. No erythema. No pallor.  Right buttock stage II pressure ulcer healed, mild erythema in the area. Right hip surgical incisions healed.    Psychiatric: She has a normal mood and affect. Her behavior is normal. Judgment and thought content normal.    Labs reviewed:  Recent Labs  09/13/15 0510 09/14/15 0328 09/18/15 09/22/15 02/05/16 1333  NA 129* 129* 128* 129* 137  K 3.8 3.8 5.2 4.6 4.1  CL 99* 97*  --   --  102  CO2 25 26  --   --  23  GLUCOSE 118* 112*  --   --  87  BUN 14 15 16 10 15   CREATININE 0.67 0.59 0.6 0.7 0.78  CALCIUM 8.2* 8.1*  --   --  9.9    Recent Labs  09/18/15 02/05/16 1333  AST 61* 30  ALT 48* 26  ALKPHOS 127* 90  BILITOT  --  0.7  PROT  --  7.2  ALBUMIN  --  4.2    Recent Labs  09/10/15 1835 09/13/15 0510 09/14/15 0328 09/18/15 09/22/15 02/05/16 1333  WBC 4.5 12.0* 9.3 11.1 8.2 7.1  NEUTROABS 3.2  --   --   --   --  5.0  HGB 13.0 8.1* 7.8* 8.7* 8.5* 12.7  HCT 37.0 23.2* 22.2* 25* 24* 37.7  MCV 88.7 88.5 88.4  --   --  89.5  PLT 223 163 180 436* 535* 300   Lab Results  Component Value Date   TSH 1.73 09/18/2015   No results found for: HGBA1C Lab Results  Component Value Date   CHOL 92 09/22/2015   HDL 40 09/22/2015   LDLCALC 41 09/22/2015   TRIG 57 09/22/2015    Significant Diagnostic Results in last 30 days:  No results found.  Assessment/Plan  Sore in mouth  the sore area on the roof of mouth, noted erythematous area about a quarter size, the patient stated  it has been about 2-3 days. Magic mouth wash 5ml s/s ac and hs x 2 weeks.   Postoperative anemia due to acute blood loss Continue Fe  qd, update CBC, last Hgb 12.7 02/05/16  PAF (paroxysmal atrial fibrillation) (HCC) Heart rate is in control. Off meds.   Hyponatremia Last serum Na 137, pending CmP  Constipation Stable, continue Colace  daily, prn MiraLax.     Family/ staff Communication: SNF for rehab,  [place]   Labs/tests ordered:  none

## 2016-02-22 NOTE — Assessment & Plan Note (Signed)
Stable, continue Colace 100mg  daily, prn MiraLax.

## 2016-02-22 NOTE — Assessment & Plan Note (Signed)
the sore area on the roof of mouth, noted erythematous area about a quarter size, the patient stated it has been about 2-3 days. Magic mouth wash 5ml s/s ac and hs x 2 weeks.

## 2016-02-22 NOTE — Assessment & Plan Note (Signed)
Heart rate is in control. Off meds.

## 2016-02-22 NOTE — Assessment & Plan Note (Signed)
Continue Fe 324mg  qd, update CBC, last Hgb 12.7 02/05/16

## 2016-02-23 LAB — CBC AND DIFFERENTIAL
HCT: 27 % — AB (ref 36–46)
HEMOGLOBIN: 9.5 g/dL — AB (ref 12.0–16.0)
Platelets: 344 10*3/uL (ref 150–399)
WBC: 8.7 10^3/mL

## 2016-02-23 LAB — BASIC METABOLIC PANEL
BUN: 13 mg/dL (ref 4–21)
CREATININE: 0.6 mg/dL (ref 0.5–1.1)
GLUCOSE: 83 mg/dL
POTASSIUM: 4 mmol/L (ref 3.4–5.3)
SODIUM: 128 mmol/L — AB (ref 137–147)

## 2016-02-23 LAB — HEPATIC FUNCTION PANEL
ALT: 35 U/L (ref 7–35)
AST: 32 U/L (ref 13–35)
Alkaline Phosphatase: 95 U/L (ref 25–125)
Bilirubin, Total: 0.9 mg/dL

## 2016-02-28 ENCOUNTER — Encounter: Payer: Self-pay | Admitting: Nurse Practitioner

## 2016-02-28 DIAGNOSIS — E46 Unspecified protein-calorie malnutrition: Secondary | ICD-10-CM | POA: Insufficient documentation

## 2016-06-29 ENCOUNTER — Telehealth: Payer: Self-pay

## 2016-06-29 NOTE — Telephone Encounter (Signed)
**Note De-Identified  Obfuscation** LMTCB.  The pt has been scheduled to see Norma FredricksonLori Gerhardt, NP on 8/14 at 11 am.

## 2016-06-29 NOTE — Telephone Encounter (Signed)
**Note De-Identified  Obfuscation** Dr Yetta BarreJones, the pts pcp,  called the office today to ask what the pts cardiac diagnosis are as he is seeing her today for dizziness. I gave him her diagnosis. He states that her BP is going up and down and wants the pt to be seen soon He is requesting an appt for the pt to see Dr Elease HashimotoNahser. I asked if the pt can be seen by one of our APPs and he stated that would be ok.  I gave this information to [place] in scheduling to arrange date and time of OV.

## 2016-06-29 NOTE — Telephone Encounter (Signed)
**Note De-Identified  Obfuscation** The pt is advised that she has an appt with Norma FredricksonLori Gerhardt, NP on 8/14 at 11 am. She verbalized understanding and is in agreement with date and time of OV.

## 2016-07-04 ENCOUNTER — Ambulatory Visit (INDEPENDENT_AMBULATORY_CARE_PROVIDER_SITE_OTHER): Payer: Medicare Other | Admitting: Nurse Practitioner

## 2016-07-04 ENCOUNTER — Encounter: Payer: Self-pay | Admitting: Nurse Practitioner

## 2016-07-04 VITALS — BP 200/90 | HR 73 | Ht 62.0 in | Wt 117.8 lb

## 2016-07-04 DIAGNOSIS — I493 Ventricular premature depolarization: Secondary | ICD-10-CM | POA: Diagnosis not present

## 2016-07-04 DIAGNOSIS — I10 Essential (primary) hypertension: Secondary | ICD-10-CM | POA: Diagnosis not present

## 2016-07-04 LAB — CBC
HCT: 36.3 % (ref 35.0–45.0)
Hemoglobin: 12.2 g/dL (ref 11.7–15.5)
MCH: 30.8 pg (ref 27.0–33.0)
MCHC: 33.6 g/dL (ref 32.0–36.0)
MCV: 91.7 fL (ref 80.0–100.0)
MPV: 9.9 fL (ref 7.5–12.5)
Platelets: 292 10*3/uL (ref 140–400)
RBC: 3.96 MIL/uL (ref 3.80–5.10)
RDW: 14.1 % (ref 11.0–15.0)
WBC: 5.7 10*3/uL (ref 3.8–10.8)

## 2016-07-04 LAB — BASIC METABOLIC PANEL
BUN: 17 mg/dL (ref 7–25)
CO2: 28 mmol/L (ref 20–31)
Calcium: 9.6 mg/dL (ref 8.6–10.4)
Chloride: 101 mmol/L (ref 98–110)
Creat: 0.71 mg/dL (ref 0.60–0.88)
Glucose, Bld: 78 mg/dL (ref 65–99)
Potassium: 4.4 mmol/L (ref 3.5–5.3)
Sodium: 134 mmol/L — ABNORMAL LOW (ref 135–146)

## 2016-07-04 MED ORDER — LISINOPRIL 5 MG PO TABS: 5.0000 mg | ORAL_TABLET | Freq: Every day | ORAL | 3 refills | Status: DC

## 2016-07-04 NOTE — Progress Notes (Signed)
CARDIOLOGY OFFICE NOTE  Date:  07/04/2016    Candice Willis Date of Birth: 1923-12-14 Medical Record #[id number]  PCP:  Paulino RilyJONES,ENRICO G, MD  Cardiologist:  Nahser  Chief Complaint  Patient presents with  . Dizziness    Work in visit. Seen for Dr. Elease HashimotoNahser    History of Present Illness: Candice Willis is a 82 y.o. female who presents today for a work in visit. Seen for Dr. Elease HashimotoNahser.   She has a history of PAF (no details), HLD and PVCs. Last seen here back in February. Echo updated and was satisfactory.   Comes in today. Here alone. Was told to come here. Sent here by PCP - apparently BP labile and she has had dizziness. Not able to see his visit. Says BP has been high and then goes back to normal. She has had some dizziness and this is off and on - was given Antivert - some improvement. No chest pain. [facility]. ?salt use. Seen by [facility] a year ago and BP was noted to be pretty high. Sounds like she has never been on BP medicine. Fairly active.   Past Medical History:  Diagnosis Date  . Arteriosclerotic cardiovascular disease   . Constipation   . Degenerative joint disease of right hip 10/15/14  . Dysrhythmia    Paroxysmal Atrial Fibrillation  . Fracture of multiple pubic rami (HCC) 10/15/14   Right inferior and superior pubic rami  . HOH (hard of hearing)    Wears bilateral hearing aids  . Hyperlipidemia   . Hypertension   . Macular degeneration   . Memory deficit   . Osteoporosis   . Pain in right foot   . Palpitations   . Paroxysmal atrial fibrillation (HCC)   . Thyroid nodule    Status post surgery  . Ulcer of hard palate   . Vertigo     Past Surgical History:  Procedure Laterality Date  . ABDOMINAL HYSTERECTOMY  1968  . APPENDECTOMY  1940's  . COLONOSCOPY    . FOOT SURGERY Left   . HARDWARE REMOVAL Right 02/16/2016   Procedure: HARDWARE REMOVAL RT HIP;  Surgeon: Sheral Apleyimothy D Murphy, MD;  [facility];  Service: Orthopedics;  Laterality:  Right;  . HIP ARTHROPLASTY Right 02/16/2016   Procedure: ARTHROPLASTY BIPOLAR HIP (HEMIARTHROPLASTY);  Surgeon: Sheral Apleyimothy D Murphy, MD;  [facility];  Service: Orthopedics;  Laterality: Right;  . INTRAMEDULLARY (IM) NAIL INTERTROCHANTERIC Right 09/11/2015   Procedure: INTRAMEDULLARY (IM) NAIL INTERTROCHANTRIC;  Surgeon: Sheral Apleyimothy D Murphy, MD;  Location: [facility] OR;  Service: Orthopedics;  Laterality: Right;  . KNEE SURGERY       Medications: Current Outpatient Prescriptions  Medication Sig Dispense Refill  . aspirin EC 325 MG tablet Take 1 tablet (325 mg total) by mouth daily. 30 tablet 0  . atorvastatin (LIPITOR) 20 MG tablet Take 20 mg by mouth daily.    . Biotin 10 MG CAPS Take 10 mg by mouth daily.     Marland Kitchen. docusate sodium (COLACE) 100 MG capsule Take 1 capsule (100 mg total) by mouth daily. (Patient taking differently: Take 100 mg by mouth daily as needed for mild constipation. ) 60 capsule 0  . ferrous gluconate (FERGON) 324 MG tablet Take 1 tablet (324 mg total) by mouth daily with breakfast.  3  . HYDROcodone-acetaminophen (NORCO) 5-325 MG tablet Take 1 tablet by mouth every 6 (six) hours as needed for moderate pain. 90 tablet 0  . lactose free nutrition (  BOOST) LIQD Take 237 mLs by mouth daily. Drinks half in the morning, other half in the evening    . meclizine (ANTIVERT) 12.5 MG tablet Take 1 tablet (12.5 mg total) by mouth 3 (three) times daily as needed for dizziness. 30 tablet 0  . Melatonin 3 MG CAPS Take 1 capsule by mouth at bedtime as needed.    . Multiple Vitamin (MULTIVITAMIN WITH MINERALS) TABS Take 1 tablet by mouth daily.    . Multiple Vitamins-Minerals (PRESERVISION/LUTEIN) CAPS Take 2 capsules by mouth daily.    . ondansetron (ZOFRAN) 4 MG tablet Take 1 tablet (4 mg total) by mouth every 8 (eight) hours as needed for nausea or vomiting. 40 tablet 0  . polyethylene glycol (MIRALAX / GLYCOLAX) packet Take 17 g by mouth daily as needed for mild constipation.      No current  facility-administered medications for this visit.     Allergies: Allergies  Allergen Reactions  . Penicillins Other (See Comments)    Unknown     Social History: The patient  reports that she quit smoking about 44 years ago. Her smoking use included Cigarettes. She smoked 0.50 packs per day. She has never used smokeless tobacco. She reports that she drinks alcohol. She reports that she does not use drugs.   Family History: The patient's family history includes Cancer in her brother, father, maternal grandmother, and sister; Heart disease in her brother, father, and paternal grandmother; Hyperlipidemia in her brother and sister.   Review of Systems: Please see the history of present illness.   Otherwise, the review of systems is positive for none.   All other systems are reviewed and negative.   Physical Exam: VS:  BP (!) 200/90   Pulse 73   Ht 5\' 2"  (1.575 m)   Wt 117 lb 12.8 oz (53.4 kg)   SpO2 94% Comment: at rest  BMI 21.55 kg/m  .  BMI Body mass index is 21.55 kg/m.  Wt Readings from Last 3 Encounters:  07/04/16 117 lb 12.8 oz (53.4 kg)  02/22/16 119 lb 6.4 oz (54.2 kg)  02/19/16 116 lb (52.6 kg)   BP by me is 190/80 in the right arm and 180/80 in the left arm.   General: Pleasant. Elderly female who is alert and in no acute distress.   HEENT: Normal.  Neck: Supple, no JVD, carotid bruits, or masses noted.  Cardiac: Regular rate and rhythm. Occasional ectopic. Soft systolic murmur noted. No edema.  Respiratory:  Lungs are clear to auscultation bilaterally with normal work of breathing.  GI: Soft and nontender.  MS: No deformity or atrophy. Gait and ROM intact. Using a walker.  Skin: Warm and dry. Color is normal.  Neuro:  Strength and sensation are intact and no gross focal deficits noted.  Psych: Alert, appropriate and with normal affect.   LABORATORY DATA:  EKG:  EKG is not ordered today.   Lab Results  Component Value Date   WBC 8.7 02/23/2016   HGB 9.5  (A) 02/23/2016   HCT 27 (A) 02/23/2016   PLT 344 02/23/2016   GLUCOSE 87 02/05/2016   CHOL 92 09/22/2015   TRIG 57 09/22/2015   HDL 40 09/22/2015   LDLCALC 41 09/22/2015   ALT 35 02/23/2016   AST 32 02/23/2016   NA 128 (A) 02/23/2016   K 4.0 02/23/2016   CL 102 02/05/2016   CREATININE 0.6 02/23/2016   BUN 13 02/23/2016   CO2 23 02/05/2016   TSH 1.73 09/18/2015  INR 1.02 02/05/2016    BNP (last 3 results) No results for input(s): BNP in the last 8760 hours.  ProBNP (last 3 results) No results for input(s): PROBNP in the last 8760 hours.   Other Studies Reviewed Today:  Echo Study Conclusions from 12/2015  - Left ventricle: The cavity size was normal. There was mild   hypertrophy of the posterior wall. Systolic function was normal.   The estimated ejection fraction was in the range of 60% to 65%.   Wall motion was normal; there were no regional wall motion   abnormalities. Features are consistent with a pseudonormal left   ventricular filling pattern, with concomitant abnormal relaxation   and increased filling pressure (grade 2 diastolic dysfunction). - Aortic valve: There was moderate regurgitation. - Mitral valve: There was mild regurgitation. - Left atrium: The atrium was severely dilated. - Right ventricle: The cavity size was normal. Wall thickness was   normal. Systolic function was normal. - Right atrium: The atrium was moderately dilated. - Atrial septum: No defect or patent foramen ovale was identified   by color flow Doppler. - Tricuspid valve: There was mild regurgitation. - Inferior vena cava: The vessel was normal in size. The   respirophasic diameter changes were in the normal range (= 50%),   consistent with normal central venous pressure.  Assessment/Plan: 1. HTN - uncontrolled - seems like this has been going on for quite some time. Will start low dose ACE - check lab today. Get her back for follow up next week. May need further titration but would  go slow. She needs to restrict her salt as well. [facility] and keep a record for her to bring back.   2. Diastolic dysfunction - seems compensated  3. Aortic regurgitation - would manage conservatively  4. PVCs   Current medicines are reviewed with the patient today.  The patient does not have concerns regarding medicines other than what has been noted above.  The following changes have been made:  See above.  Labs/ tests ordered today include:   No orders of the defined types were placed in this encounter.    Disposition:   FU next week.    Patient is agreeable to this plan and will call if any problems develop in the interim.   Signed: Rosalio MacadamiaLori C. Anvika Gashi, RN, ANP-C 07/04/2016 11:21 AM  [facility] [address] [place], [place]  [address] Phone: [phone] Fax: [phone]

## 2016-07-04 NOTE — Patient Instructions (Addendum)
We will be checking the following labs today - BMET and CBC   Medication Instructions:    Continue with your current medicines. BUT  I am adding Lisinopril 5 mg a day - this has been sent to your drug store - start this today.     Testing/Procedures To Be Arranged:  N/A  Follow-Up:   See PA/NP for Dr. Melburn PopperNasher in one week    Other Special Instructions:   Let the nurses's at [facility] check some BP and write them down for us  Use less salt - no soup and no salt shaker.     If you need a refill on your cardiac medications before your next appointment, please call your pharmacy.   Call the [facility] office at [phone] if you have any questions, problems or concerns.

## 2016-07-06 ENCOUNTER — Other Ambulatory Visit: Payer: Self-pay | Admitting: *Deleted

## 2016-07-06 ENCOUNTER — Telehealth: Payer: Self-pay | Admitting: Nurse Practitioner

## 2016-07-06 DIAGNOSIS — R0989 Other specified symptoms and signs involving the circulatory and respiratory systems: Secondary | ICD-10-CM

## 2016-07-06 NOTE — Telephone Encounter (Signed)
See result note.  

## 2016-07-06 NOTE — Telephone Encounter (Signed)
She needs a renal duplex.  Cut the Lisinopril to 2. 5mg   See Lorin PicketScott as planned next week.

## 2016-07-06 NOTE — Telephone Encounter (Signed)
Tiffany is calling because Mrs. Candice Willis was seen on Monday ans was started on a new Blood Pressure medication (Lisinopril  5mg ) and her blood is 82/50 and heart rate 72 and Monday in the office her bp was 200/90. Want to know what to do about the drop in the blood pressure . Please call   Thanks

## 2016-07-11 ENCOUNTER — Ambulatory Visit (HOSPITAL_COMMUNITY)
Admission: RE | Admit: 2016-07-11 | Discharge: 2016-07-11 | Disposition: A | Discharge status: Home / Self Care | Payer: Medicare Other | Source: Ambulatory Visit | Attending: Cardiology | Admitting: Cardiology

## 2016-07-11 DIAGNOSIS — I774 Celiac artery compression syndrome: Secondary | ICD-10-CM | POA: Diagnosis not present

## 2016-07-11 DIAGNOSIS — E785 Hyperlipidemia, unspecified: Secondary | ICD-10-CM | POA: Diagnosis not present

## 2016-07-11 DIAGNOSIS — I10 Essential (primary) hypertension: Secondary | ICD-10-CM | POA: Insufficient documentation

## 2016-07-11 DIAGNOSIS — R0989 Other specified symptoms and signs involving the circulatory and respiratory systems: Secondary | ICD-10-CM

## 2016-07-11 DIAGNOSIS — N1330 Unspecified hydronephrosis: Secondary | ICD-10-CM | POA: Diagnosis not present

## 2016-07-14 ENCOUNTER — Encounter: Payer: Self-pay | Admitting: Physician Assistant

## 2016-07-14 NOTE — Progress Notes (Addendum)
Cardiology Office Note:    Date:  07/15/2016   ID:  VERDELL KINCANNON, DOB [birth year], MRN [id number]  PCP:  Paulino Rily, MD  Cardiologist:  Dr. Delane Ginger   Electrophysiologist:  N/a  Referring MD: Knox Royalty, MD   No chief complaint on file. FU HTN  History of Present Illness:    Candice Willis is a 82 y.o. female with a hx of HTN and PVCs. She currently resides in [facility]. She tells me the staff gives her her medications and checks her B/P twice a day. She last saw Dr Elease Hashimoto in Feb 2017 and had an echo then prior to hip surgery.  Echo showed her EF to be 60-65%, grade 2 DD, and severe LAE.  She has had labile blood pressures and was referred to Norma Fredrickson by her PCP 07/04/16. Her blood pressure was 200/90. She complained of dizziness. ACE inhibitor was started for blood pressure. Labs demonstrated normal hemoglobin, normal creatinine, normal potassium. Patient was noted to have low blood pressure per nursing facility and lisinopril was reduced to 2.5 mg daily. Renal artery duplex was obtained. This demonstrated normal bilateral renal arteries.   She returns for follow-up today. He B/P was 160 systolic by the rooming RN and 182 systolic by me. She is in no acute distress but is a little anxious. She was dropped off at the [place] office by mistake today. She is unclear of what medications she is taking and we have contacted [facility] for a list. She denies any chest pain or dyspnea but again complaints of mild dizziness.    Prior CV studies that were reviewed today include:    Renal Art [place] 07/11/16 Normal caliber abdominal aorta. 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 Normal bilateral kidney size. Left mild hydronephrosis. Normal renal arteries, bilaterally. The IVC and renal veins are patent. F/u PRN  Echo 01/15/16 Mild posterior wall LVH, EF 60-65%, normal wall motion, grade 2 diastolic dysfunction, moderate AI, mild MR, severe LAE, normal RVSF,  moderate RAE, mild TR  Carotid US 4/14 Mild plaque, no sig ICA stenosis  Past Medical History:  Diagnosis Date  . Arteriosclerotic cardiovascular disease   . Constipation   . Degenerative joint disease of right hip 10/15/14  . Dysrhythmia    Paroxysmal Atrial Fibrillation  . Fracture of multiple pubic rami (HCC) 10/15/14   Right inferior and superior pubic rami  . HOH (hard of hearing)    Wears bilateral hearing aids  . Hyperlipidemia   . Hypertension   . Macular degeneration   . Memory deficit   . Osteoporosis   . Pain in right foot   . Palpitations   . Paroxysmal atrial fibrillation (HCC)   . Thyroid nodule    Status post surgery  . Ulcer of hard palate   . Vertigo     Past Surgical History:  Procedure Laterality Date  . ABDOMINAL HYSTERECTOMY  1968  . APPENDECTOMY  1940's  . COLONOSCOPY    . FOOT SURGERY Left   . HARDWARE REMOVAL Right 02/16/2016   Procedure: HARDWARE REMOVAL RT HIP;  Surgeon: Sheral Apley, MD;  Location[facility];  Service: Orthopedics;  Laterality: Right;  . HIP ARTHROPLASTY Right 02/16/2016   Procedure: ARTHROPLASTY BIPOLAR HIP (HEMIARTHROPLASTY);  Surgeon: Sheral Apley, MD;  [facility];  Service: Orthopedics;  Laterality: Right;  . INTRAMEDULLARY (IM) NAIL INTERTROCHANTERIC Right 09/11/2015   Procedure: INTRAMEDULLARY (IM) NAIL INTERTROCHANTRIC;  Surgeon: Sheral Apley, MD;  Location:  [facility] OR;  Service: Orthopedics;  Laterality: Right;  . KNEE SURGERY      Current Medications: Outpatient Medications Prior to Visit  Medication Sig Dispense Refill  . atorvastatin (LIPITOR) 20 MG tablet Take 20 mg by mouth daily.    . Biotin 10 MG CAPS Take 10 mg by mouth daily.     . ferrous gluconate (FERGON) 324 MG tablet Take 1 tablet (324 mg total) by mouth daily with breakfast.  3  . lactose free nutrition (BOOST) LIQD Take 237 mLs by mouth daily. Drinks half in the morning, other half in the evening    . meclizine (ANTIVERT) 12.5 MG tablet Take  1 tablet (12.5 mg total) by mouth 3 (three) times daily as needed for dizziness. 30 tablet 0  . Melatonin 3 MG CAPS Take 1 capsule by mouth at bedtime as needed.    . Multiple Vitamin (MULTIVITAMIN WITH MINERALS) TABS Take 1 tablet by mouth daily.    . Multiple Vitamins-Minerals (PRESERVISION/LUTEIN) CAPS Take 2 capsules by mouth daily.    . polyethylene glycol (MIRALAX / GLYCOLAX) packet Take 17 g by mouth daily as needed for mild constipation.     Marland Kitchen. aspirin EC 325 MG tablet Take 1 tablet (325 mg total) by mouth daily. 30 tablet 0  . docusate sodium (COLACE) 100 MG capsule Take 1 capsule (100 mg total) by mouth daily. (Patient taking differently: Take 100 mg by mouth daily as needed for mild constipation. ) 60 capsule 0  . HYDROcodone-acetaminophen (NORCO) 5-325 MG tablet Take 1 tablet by mouth every 6 (six) hours as needed for moderate pain. 90 tablet 0  . lisinopril (PRINIVIL,ZESTRIL) 5 MG tablet Take 1 tablet (5 mg total) by mouth daily. 30 tablet 3  . ondansetron (ZOFRAN) 4 MG tablet Take 1 tablet (4 mg total) by mouth every 8 (eight) hours as needed for nausea or vomiting. 40 tablet 0   No facility-administered medications prior to visit.       Allergies:   Penicillins   Social History   Social History  . Marital status: Single    Spouse name: N/A  . Number of children: 0  . Years of education: 16+   Occupational History  . Retired     Social History Main Topics  . Smoking status: Former Smoker    Packs/day: 0.50    Types: Cigarettes    Quit date: 10/05/1971  . Smokeless tobacco: Never Used  . Alcohol use Yes     Comment: 1-4 per week; eats 10 raisins daily soaked in Gin  . Drug use: No  . Sexual activity: Not Asked   Other Topics Concern  . None   Social History Narrative   [facility]   Caffeine use: 2 cups coffee per day   No tea/soda     Family History:  The patient's family history includes Cancer in her brother, father, maternal  grandmother, and sister; Heart disease in her brother, father, and paternal grandmother; Hyperlipidemia in her brother and sister.   ROS:   Please see the history of present illness.    ROS All other systems reviewed and are negative.   EKGs/Labs/Other Test Reviewed:    EKG:  EKG is ordered today.  The ekg ordered today demonstrates   Recent Labs: 09/18/2015: TSH 1.73 02/23/2016: ALT 35 07/04/2016: BUN 17; Creat 0.71; Hemoglobin 12.2; Platelets 292; Potassium 4.4; Sodium 134   Recent Lipid Panel    Component Value Date/Time   CHOL 92  09/22/2015   TRIG 57 09/22/2015   HDL 40 09/22/2015   LDLCALC 41 09/22/2015     Physical Exam:    VS:  BP (!) 160/72 (BP Location: Left Arm)   Pulse 66   Ht 5\' 2"  (1.575 m)   Wt 117 lb 9.6 oz (53.3 kg)   BMI 21.51 kg/m     Wt Readings from Last 3 Encounters:  07/15/16 117 lb 9.6 oz (53.3 kg)  07/04/16 117 lb 12.8 oz (53.4 kg)  02/22/16 119 lb 6.4 oz (54.2 kg)     Physical Exam 182/ 80- P-68 Gen: WD WN elderly female, thin, using a walker Neck: Without bruit or JVD Chest: Clear CV: RRR, S4 Extrem: no edema Neuro: grossly intact  ASSESSMENT:    1. Essential hypertension    PLAN:    In order of problems listed above:  1. HTN - Still high: Recent renal artery duplex with normal bilateral renal arteries. Recent creatinine normal.   2. Diastolic dysfunction - no CHF  3. Aortic insufficiency - Mod by echo in in 2/17.  Conservative management.    4. PVCs   Medication Adjustments/Labs and Tests Ordered:  Consider addition of Norvasc 5 mg daily, changing Lisinopril 5 mg to Q HS.  Plan:  B/P check-can be called in from nursing facility.  F/U with PCP once B/P controlled  Candice KILROY PA-C 07/15/2016 12:45 PM   Medications reviewed- she is on Lisinopril 5 mg QD. I suggested she change this to QHS and start Norvasc 5 mg daily. [facility].  Corine ShelterLUKE KILROY PA-C 07/15/2016 1:44 PM

## 2016-07-15 ENCOUNTER — Encounter: Payer: Self-pay | Admitting: Cardiology

## 2016-07-15 ENCOUNTER — Ambulatory Visit (INDEPENDENT_AMBULATORY_CARE_PROVIDER_SITE_OTHER): Payer: Medicare Other | Admitting: Cardiology

## 2016-07-15 VITALS — BP 160/72 | HR 66 | Ht 62.0 in | Wt 117.6 lb

## 2016-07-15 DIAGNOSIS — I10 Essential (primary) hypertension: Secondary | ICD-10-CM | POA: Diagnosis not present

## 2016-07-15 NOTE — Assessment & Plan Note (Signed)
B/P remains elevated, will confirm medications before making adjustments.

## 2016-07-15 NOTE — Patient Instructions (Signed)
Medications  Take Lisinopril 5 mg daily at bedtime  Take amlodipine 5 mg daily.   Call in 1 week with BP readings.

## 2016-07-15 NOTE — Addendum Note (Signed)
Addended by: Evans LanceSTOVER, Malania Gawthrop W on: 07/15/2016 02:03 PM   Modules accepted: Orders

## 2016-07-20 ENCOUNTER — Encounter: Payer: Self-pay | Admitting: *Deleted

## 2016-07-27 ENCOUNTER — Ambulatory Visit: Payer: Medicare Other | Admitting: Physician Assistant

## 2016-08-23 ENCOUNTER — Observation Stay (HOSPITAL_COMMUNITY)
Admission: EM | Admit: 2016-08-23 | Discharge: 2016-08-24 | Disposition: A | Discharge status: Home / Self Care | Payer: Medicare Other | Attending: Family Medicine | Admitting: Family Medicine

## 2016-08-23 ENCOUNTER — Encounter (HOSPITAL_COMMUNITY): Payer: Self-pay | Admitting: Emergency Medicine

## 2016-08-23 DIAGNOSIS — I10 Essential (primary) hypertension: Secondary | ICD-10-CM | POA: Insufficient documentation

## 2016-08-23 DIAGNOSIS — Z87891 Personal history of nicotine dependence: Secondary | ICD-10-CM | POA: Insufficient documentation

## 2016-08-23 DIAGNOSIS — Z880 Allergy status to penicillin: Secondary | ICD-10-CM | POA: Insufficient documentation

## 2016-08-23 DIAGNOSIS — T783XXA Angioneurotic edema, initial encounter: Secondary | ICD-10-CM | POA: Diagnosis not present

## 2016-08-23 DIAGNOSIS — I480 Paroxysmal atrial fibrillation: Secondary | ICD-10-CM | POA: Diagnosis not present

## 2016-08-23 DIAGNOSIS — R220 Localized swelling, mass and lump, head: Secondary | ICD-10-CM

## 2016-08-23 DIAGNOSIS — E785 Hyperlipidemia, unspecified: Secondary | ICD-10-CM | POA: Insufficient documentation

## 2016-08-23 DIAGNOSIS — M810 Age-related osteoporosis without current pathological fracture: Secondary | ICD-10-CM | POA: Diagnosis not present

## 2016-08-23 DIAGNOSIS — H3530 Unspecified macular degeneration: Secondary | ICD-10-CM | POA: Diagnosis not present

## 2016-08-23 DIAGNOSIS — Z7982 Long term (current) use of aspirin: Secondary | ICD-10-CM | POA: Diagnosis not present

## 2016-08-23 DIAGNOSIS — T63441A Toxic effect of venom of bees, accidental (unintentional), initial encounter: Secondary | ICD-10-CM

## 2016-08-23 DIAGNOSIS — T7840XA Allergy, unspecified, initial encounter: Secondary | ICD-10-CM

## 2016-08-23 HISTORY — DX: Toxic effect of venom of bees, accidental (unintentional), initial encounter: T63.441A

## 2016-08-23 LAB — I-STAT CHEM 8, ED
BUN: 17 mg/dL (ref 6–20)
CALCIUM ION: 1.25 mmol/L (ref 1.15–1.40)
CHLORIDE: 96 mmol/L — AB (ref 101–111)
Creatinine, Ser: 0.8 mg/dL (ref 0.44–1.00)
Glucose, Bld: 146 mg/dL — ABNORMAL HIGH (ref 65–99)
HEMATOCRIT: 38 % (ref 36.0–46.0)
Hemoglobin: 12.9 g/dL (ref 12.0–15.0)
POTASSIUM: 3.8 mmol/L (ref 3.5–5.1)
SODIUM: 132 mmol/L — AB (ref 135–145)
TCO2: 26 mmol/L (ref 0–100)

## 2016-08-23 LAB — CBC WITH DIFFERENTIAL/PLATELET
BASOS ABS: 0 10*3/uL (ref 0.0–0.1)
BASOS PCT: 0 %
EOS ABS: 0.1 10*3/uL (ref 0.0–0.7)
EOS PCT: 1 %
HCT: 38.1 % (ref 36.0–46.0)
HEMOGLOBIN: 13 g/dL (ref 12.0–15.0)
Lymphocytes Relative: 34 %
Lymphs Abs: 3.2 10*3/uL (ref 0.7–4.0)
MCH: 31.4 pg (ref 26.0–34.0)
MCHC: 34.1 g/dL (ref 30.0–36.0)
MCV: 92 fL (ref 78.0–100.0)
Monocytes Absolute: 1 10*3/uL (ref 0.1–1.0)
Monocytes Relative: 11 %
NEUTROS PCT: 54 %
Neutro Abs: 5 10*3/uL (ref 1.7–7.7)
PLATELETS: 340 10*3/uL (ref 150–400)
RBC: 4.14 MIL/uL (ref 3.87–5.11)
RDW: 13.2 % (ref 11.5–15.5)
WBC: 9.3 10*3/uL (ref 4.0–10.5)

## 2016-08-23 MED ORDER — METHYLPREDNISOLONE SODIUM SUCC 125 MG IJ SOLR
INTRAMUSCULAR | Status: AC
  Filled 2016-08-23: qty 2

## 2016-08-23 MED ORDER — FERROUS GLUCONATE 324 (38 FE) MG PO TABS
324.0000 mg | ORAL_TABLET | Freq: Every day | ORAL | Status: DC
  Administered 2016-08-24: 324 mg via ORAL
  Filled 2016-08-23 (×2): qty 1

## 2016-08-23 MED ORDER — ASPIRIN 325 MG PO TABS
325.0000 mg | ORAL_TABLET | Freq: Every day | ORAL | Status: DC
  Administered 2016-08-24: 325 mg via ORAL
  Filled 2016-08-23: qty 1

## 2016-08-23 MED ORDER — ENOXAPARIN SODIUM 40 MG/0.4ML ~~LOC~~ SOLN
40.0000 mg | SUBCUTANEOUS | Status: DC
  Administered 2016-08-23: 40 mg via SUBCUTANEOUS
  Filled 2016-08-23: qty 0.4

## 2016-08-23 MED ORDER — AMLODIPINE BESYLATE 5 MG PO TABS
5.0000 mg | ORAL_TABLET | Freq: Every day | ORAL | Status: DC
  Administered 2016-08-24: 5 mg via ORAL
  Filled 2016-08-23: qty 1

## 2016-08-23 MED ORDER — ATORVASTATIN CALCIUM 20 MG PO TABS
20.0000 mg | ORAL_TABLET | Freq: Every day | ORAL | Status: DC
  Administered 2016-08-23 – 2016-08-24 (×2): 20 mg via ORAL
  Filled 2016-08-23 (×2): qty 1

## 2016-08-23 MED ORDER — MELATONIN 3 MG PO TABS
3.0000 mg | ORAL_TABLET | Freq: Every evening | ORAL | Status: DC | PRN
  Filled 2016-08-23: qty 1

## 2016-08-23 MED ORDER — METHYLPREDNISOLONE SODIUM SUCC 125 MG IJ SOLR
125.0000 mg | Freq: Once | INTRAMUSCULAR | Status: AC
  Administered 2016-08-23: 125 mg via INTRAVENOUS

## 2016-08-23 MED ORDER — FAMOTIDINE IN NACL 20-0.9 MG/50ML-% IV SOLN
20.0000 mg | Freq: Once | INTRAVENOUS | Status: AC
  Administered 2016-08-23: 20 mg via INTRAVENOUS
  Filled 2016-08-23: qty 50

## 2016-08-23 MED ORDER — RACEPINEPHRINE HCL 2.25 % IN NEBU
0.5000 mL | INHALATION_SOLUTION | Freq: Once | RESPIRATORY_TRACT | Status: AC
  Administered 2016-08-23: 0.5 mL via RESPIRATORY_TRACT

## 2016-08-23 MED ORDER — DOCUSATE SODIUM 100 MG PO CAPS
100.0000 mg | ORAL_CAPSULE | Freq: Every day | ORAL | Status: DC
  Administered 2016-08-24: 100 mg via ORAL
  Filled 2016-08-23: qty 1

## 2016-08-23 NOTE — H&P (Signed)
Family Medicine Teaching [facility] Admission History and Physical Service Pager: [phone]  Patient name: Candice Willis Medical record number: [id number] Date of birth: 30-Jun-1924 Age: 82 y.o. Gender: female  Primary Care Provider: Murray HodgkinsArthur Green, MD Consultants: None Code Status: Full (reassessed 08/23/16)  Chief Complaint: Tongue swelling and difficulty swallowing  Assessment and Plan: Candice Willis is a 82 y.o. female presenting with Tongue swelling and difficulty swallowing after reportedly being stung on the tongue by a bee at lunch this afternoon. PMH is significant for paroxysmal A. Fib, hyperlipidemia, hypertension.  Angioedema of tongue and soft palpate, resolving:  Patient's symptoms are resolving after receiving epinephrine 2 and Benadryl in the ED via transport, and then racemic epinephrine 1 and Solu-Medrol 125 mg in the ED.  Improved tongue edema and did not appreciate any previously noted uvula deviation. - Place in observation under attending Dr. Randolm IdolFletke - We will observe for any respiratory symptoms or angioedema - Consider giving steroids and Benadryl if the above symptoms develop  Hypertension:  Blood pressure stable. Takes amlodipine 5 mg daily, and lisinopril. Cannot rule out lisinopril as a cause of angioedema at this point. - Holding lisinopril - continue amlodipine 5 mg daily  Hyperlipidemia: Documented history of hyperlipidemia - Continue Lipitor 20 mg daily  Paroxysmal atrial fibrillation:  Patient noted history of paroxysmal A. Fib.  Does not take any medication. Has no history of stroke. Currently asymptomatic. - Monitor vital signs per floor protocol  FEN/GI: soft diet Prophylaxis: lovenox 40mg   Disposition: Place in observation  History of Present Illness:  Candice Willis is a 82 y.o. female presenting with severe tongue swelling and difficulty swallowing after being stung by a bee in the mouth.  She was in her usual state of health until  lunch time when she reportedly ate a piece of food that had a bee on it.  She immediately felt a pain in her tongue and swelling.  EMS arrived at her assisted living and she was found to have significant tongue edema and received 2 rounds of epinephrine and 50 of Benadryl and had minimal improvement at that time.  Patient continued to have significantly swollen tongue and difficulty swallowing in the emergency department.  Initially she was noted to have uvular edema, muffled voice, however she denied any chest pain, shortness of breath, headache, changes in vision, weakness, lightheadedness or abdominal symptoms.  She was given Solu-Medrol 125 mg, Pepcid, racemic epi 1 with good response.  Of note patient is also on lisinopril and we cannot rule this out as a source of angioedema.  Review Of Systems: See history of present illness  ROS  Patient Active Problem List   Diagnosis Date Noted  . Angioedema 08/23/2016  . Angio-edema 08/23/2016  . Protein-calorie malnutrition (HCC) 02/28/2016  . Sore in mouth 02/22/2016  . PVC's (premature ventricular contractions) 01/05/2016  . Pressure ulcer of right buttock 09/16/2015  . Postoperative anemia due to acute blood loss 09/13/2015  . Hyponatremia 09/13/2015  . PAF (paroxysmal atrial fibrillation) (HCC) 09/11/2015  . Fall 09/10/2015  . Dizziness 09/10/2015  . Hip fracture, right (HCC) 09/10/2015  . Closed right hip fracture (HCC) 09/10/2015  . Hip fracture (HCC) 09/10/2015  . Diarrhea 09/10/2015  . Leg pain, right 06/16/2015  . Stomatitis 10/27/2014  . Vitamin D deficiency 10/24/2014  . Memory deficit   . Ulcer of hard palate   . Pain in right foot   . Thyroid nodule   . Pelvis fracture, right (HCC) 10/20/2014  .  Hyperlipidemia 10/20/2014  . Essential hypertension 10/20/2014  . Osteoporosis 10/20/2014  . Constipation 10/20/2014    Past Medical History: Past Medical History:  Diagnosis Date  . Arteriosclerotic cardiovascular disease    . Bee sting reaction 08/23/2016   Tongue swelling and difficulty swallowing /notes 08/23/2016  . Constipation   . Degenerative joint disease of right hip 10/15/14  . Dysrhythmia    Paroxysmal Atrial Fibrillation  . Fracture of multiple pubic rami (HCC) 10/15/14   Right inferior and superior pubic rami  . HOH (hard of hearing)    Wears bilateral hearing aids  . Hyperlipidemia   . Hypertension   . Macular degeneration   . Memory deficit   . Osteoporosis   . Pain in right foot   . Palpitations   . Paroxysmal atrial fibrillation (HCC)   . Thyroid nodule    Status post surgery  . Ulcer of hard palate   . Vertigo     Past Surgical History: Past Surgical History:  Procedure Laterality Date  . ABDOMINAL HYSTERECTOMY  1968  . APPENDECTOMY  1940's  . COLONOSCOPY    . FOOT SURGERY Left   . HARDWARE REMOVAL Right 02/16/2016   Procedure: HARDWARE REMOVAL RT HIP;  Surgeon: Sheral Apley, MD;  Location: [facility] OR;  Service: Orthopedics;  Laterality: Right;  . HIP ARTHROPLASTY Right 02/16/2016   Procedure: ARTHROPLASTY BIPOLAR HIP (HEMIARTHROPLASTY);  Surgeon: Sheral Apley, MD;  Location: [facility] OR;  Service: Orthopedics;  Laterality: Right;  . INTRAMEDULLARY (IM) NAIL INTERTROCHANTERIC Right 09/11/2015   Procedure: INTRAMEDULLARY (IM) NAIL INTERTROCHANTRIC;  Surgeon: Sheral Apley, MD;  Location: [facility] OR;  Service: Orthopedics;  Laterality: Right;  . KNEE SURGERY      Social History: Social History  Substance Use Topics  . Smoking status: Former Smoker    Packs/day: 0.50    Types: Cigarettes    Quit date: 10/05/1971  . Smokeless tobacco: Never Used  . Alcohol use Yes     Comment: 1-4 per week; eats 10 raisins daily soaked in Gin    Family History: Family History  Problem Relation Age of Onset  . Heart disease Father   . Cancer Father     prostate  . Cancer Maternal Grandmother     colon  . Heart disease Paternal Grandmother   . Hyperlipidemia Sister   . Cancer Sister      breast  . Heart disease Brother   . Hyperlipidemia Brother   . Cancer Brother     prostate    Allergies and Medications: Allergies  Allergen Reactions  . Penicillins Other (See Comments)    Has patient had a PCN reaction causing immediate rash, facial/tongue/throat swelling, SOB or lightheadedness with hypotension: Unknown Has patient had a PCN reaction causing severe rash involving mucus membranes or skin necrosis: Unknown Has patient had a PCN reaction that required hospitalization: Unknown Has patient had a PCN reaction occurring within the last 10 years: No If all of the above answers are "NO", then may proceed with Cephalosporin use.    No current facility-administered medications on file prior to encounter.    Current Outpatient Prescriptions on File Prior to Encounter  Medication Sig Dispense Refill  . acetaminophen (TYLENOL) 500 MG tablet Take 500 mg by mouth every 6 (six) hours as needed (for pain, headaches, or fever).     Marland Kitchen amLODipine (NORVASC) 5 MG tablet Take 5 mg by mouth daily.    Marland Kitchen aspirin 325 MG tablet Take 325 mg by mouth  daily.    . atorvastatin (LIPITOR) 20 MG tablet Take 20 mg by mouth daily.    . Biotin 10 MG CAPS Take 10 mg by mouth daily.     Marland Kitchen docusate sodium (COLACE) 100 MG capsule Take 100 mg by mouth daily.    . ferrous gluconate (FERGON) 324 MG tablet Take 1 tablet (324 mg total) by mouth daily with breakfast.  3  . lactose free nutrition (BOOST) LIQD Take 237 mLs by mouth daily with breakfast.     . lisinopril (PRINIVIL,ZESTRIL) 5 MG tablet Take 5 mg by mouth at bedtime. HOLD FOR A SYSTOLIC B/P READING OF <120    . meclizine (ANTIVERT) 12.5 MG tablet Take 1 tablet (12.5 mg total) by mouth 3 (three) times daily as needed for dizziness. 30 tablet 0  . Multiple Vitamin (MULTIVITAMIN WITH MINERALS) TABS Take 1 tablet by mouth daily.    . Multiple Vitamins-Minerals (PRESERVISION AREDS 2 PO) Take 2 capsules by mouth daily.    . polyethylene glycol (MIRALAX /  GLYCOLAX) packet Take 17 g by mouth daily as needed (for constipation). MIX IN 4 OUNCES OF FLUID      Objective: BP 138/70 (BP Location: Left Arm)   Pulse 80   Temp 97.4 F (36.3 C) (Oral)   Resp 17   Ht 5\' 3"  (1.6 m)   Wt 114 lb 10.2 oz (52 kg)   SpO2 100%   BMI 20.31 kg/m  Exam: General: A 82 year old female sitting up in bed appearing comfortable in no acute distress Eyes: PERRL, EOMI, non-injected ENTM: Tongue edema, minimal soft palatal edema, no uvular deviation,  Neck: supple, normal ROM Cardiovascular: Regular rate and rhythm, S1 and S2 present, no murmurs Respiratory: Normal work of breathing, clear to auscultation bilaterally, no wheezing or rhonchi Gastrointestinal: Soft, non-tender, nondistended MSK: No clubbing, cyanosis, or edema Neuro: No focal deficits noted Psych: Normal mood and affect  Labs and Imaging: CBC BMET   Recent Labs Lab 08/23/16 1454 08/23/16 1539  WBC 9.3  --   HGB 13.0 12.9  HCT 38.1 38.0  PLT 340  --     Recent Labs Lab 08/23/16 1539  NA 132*  K 3.8  CL 96*  BUN 17  CREATININE 0.80  GLUCOSE 146*      Analee Montee Shelbie Hutching, MD 08/23/2016, 11:22 PM PGY-1, [facility] FPTS Intern pager: [phone], text pages welcome  UPPER LEVEL ADDENDUM  I have read the above note and made revisions highlighted in orange.  Tarri Abernethy, MD, MPH PGY-2 [facility] Pager [phone]

## 2016-08-23 NOTE — ED Notes (Signed)
Paged Anesthesia to [phone]

## 2016-08-23 NOTE — ED Notes (Signed)
Attempted to call report

## 2016-08-23 NOTE — ED Notes (Signed)
Admitting MD at bedside.

## 2016-08-23 NOTE — ED Notes (Signed)
Paged Dr. Pollyann Kennedyosen to [phone]

## 2016-08-23 NOTE — ED Triage Notes (Signed)
[facility] reporting angioedema called in at 1341.  EMS reports giving:  0.3 of 11/998 epi 50 mg Benadryl  EMS reports breath sounds clear and equal.  Pt's tongue swollen, pt reports only able to breath through nose.  Dr. Anitra LauthPlunkett brought to bedside.

## 2016-08-23 NOTE — ED Provider Notes (Signed)
[facility]-EMERGENCY DEPT Provider Note   CSN: [id number] Arrival date & time: 08/23/16  1431     History   Chief Complaint No chief complaint on file.   HPI Candice Willis is a 82 y.o. female.  Patient presents with severe tongue swelling and difficulty swallowing following being stung by a bee in the mouth. She felt normal today until she went outside and there were a lot of bees and she thinks one flew inside of her mouth. When EMS arrived patient had significant tongue edema and received 2 rounds of epi and 50 of Benadryl with minimal improvement. Here patient states her tongue just feels swollen and she is having trouble swallowing.  She denies any shortness of breath. EMS did not note any wheezing. No systemic itching.      Past Medical History:  Diagnosis Date  . Arteriosclerotic cardiovascular disease   . Constipation   . Degenerative joint disease of right hip 10/15/14  . Dysrhythmia    Paroxysmal Atrial Fibrillation  . Fracture of multiple pubic rami (HCC) 10/15/14   Right inferior and superior pubic rami  . HOH (hard of hearing)    Wears bilateral hearing aids  . Hyperlipidemia   . Hypertension   . Macular degeneration   . Memory deficit   . Osteoporosis   . Pain in right foot   . Palpitations   . Paroxysmal atrial fibrillation (HCC)   . Thyroid nodule    Status post surgery  . Ulcer of hard palate   . Vertigo     Patient Active Problem List   Diagnosis Date Noted  . Protein-calorie malnutrition (HCC) 02/28/2016  . Sore in mouth 02/22/2016  . PVC's (premature ventricular contractions) 01/05/2016  . Pressure ulcer of right buttock 09/16/2015  . Postoperative anemia due to acute blood loss 09/13/2015  . Hyponatremia 09/13/2015  . PAF (paroxysmal atrial fibrillation) (HCC) 09/11/2015  . Fall 09/10/2015  . Dizziness 09/10/2015  . Hip fracture, right (HCC) 09/10/2015  . Closed right hip fracture (HCC) 09/10/2015  . Hip fracture (HCC) 09/10/2015  .  Diarrhea 09/10/2015  . Leg pain, right 06/16/2015  . Stomatitis 10/27/2014  . Vitamin D deficiency 10/24/2014  . Memory deficit   . Ulcer of hard palate   . Pain in right foot   . Thyroid nodule   . Pelvis fracture, right (HCC) 10/20/2014  . Hyperlipidemia 10/20/2014  . Essential hypertension 10/20/2014  . Osteoporosis 10/20/2014  . Constipation 10/20/2014    Past Surgical History:  Procedure Laterality Date  . ABDOMINAL HYSTERECTOMY  1968  . APPENDECTOMY  1940's  . COLONOSCOPY    . FOOT SURGERY Left   . HARDWARE REMOVAL Right 02/16/2016   Procedure: HARDWARE REMOVAL RT HIP;  Surgeon: Sheral Apley, MD;  Location: [facility] OR;  Service: Orthopedics;  Laterality: Right;  . HIP ARTHROPLASTY Right 02/16/2016   Procedure: ARTHROPLASTY BIPOLAR HIP (HEMIARTHROPLASTY);  Surgeon: Sheral Apley, MD;  Location: [facility] OR;  Service: Orthopedics;  Laterality: Right;  . INTRAMEDULLARY (IM) NAIL INTERTROCHANTERIC Right 09/11/2015   Procedure: INTRAMEDULLARY (IM) NAIL INTERTROCHANTRIC;  Surgeon: Sheral Apley, MD;  Location: [facility] OR;  Service: Orthopedics;  Laterality: Right;  . KNEE SURGERY      OB History    No data available       Home Medications    Prior to Admission medications   Medication Sig Start Date End Date Taking? Authorizing Provider  acetaminophen (TYLENOL) 500 MG tablet Take 500 mg by mouth every 6 (  six) hours as needed.    Historical Provider, MD  amLODipine (NORVASC) 5 MG tablet Take 5 mg by mouth daily.    Historical Provider, MD  aspirin 325 MG tablet Take 325 mg by mouth daily.    Historical Provider, MD  atorvastatin (LIPITOR) 20 MG tablet Take 20 mg by mouth daily.    Historical Provider, MD  Biotin 10 MG CAPS Take 10 mg by mouth daily.     Historical Provider, MD  docusate sodium (COLACE) 100 MG capsule Take 100 mg by mouth daily.    Historical Provider, MD  ferrous gluconate (FERGON) 324 MG tablet Take 1 tablet (324 mg total) by mouth daily with breakfast. 09/14/15    Joseph ArtJessica U Vann, DO  lactose free nutrition (BOOST) LIQD Take 237 mLs by mouth daily. Drinks half in the morning, other half in the evening    Historical Provider, MD  lisinopril (PRINIVIL,ZESTRIL) 5 MG tablet Take 5 mg by mouth at bedtime.    Historical Provider, MD  meclizine (ANTIVERT) 12.5 MG tablet Take 1 tablet (12.5 mg total) by mouth 3 (three) times daily as needed for dizziness. 09/14/15   Joseph ArtJessica U Vann, DO  Melatonin 3 MG CAPS Take 1 capsule by mouth at bedtime as needed.    Historical Provider, MD  Multiple Vitamin (MULTIVITAMIN WITH MINERALS) TABS Take 1 tablet by mouth daily.    Historical Provider, MD  Multiple Vitamins-Minerals (PRESERVISION AREDS 2 PO) Take 2 capsules by mouth daily.    Historical Provider, MD  polyethylene glycol (MIRALAX / GLYCOLAX) packet Take 17 g by mouth daily as needed for mild constipation.     Historical Provider, MD    Family History Family History  Problem Relation Age of Onset  . Heart disease Father   . Cancer Father     prostate  . Cancer Maternal Grandmother     colon  . Heart disease Paternal Grandmother   . Hyperlipidemia Sister   . Cancer Sister     breast  . Heart disease Brother   . Hyperlipidemia Brother   . Cancer Brother     prostate    Social History Social History  Substance Use Topics  . Smoking status: Former Smoker    Packs/day: 0.50    Types: Cigarettes    Quit date: 10/05/1971  . Smokeless tobacco: Never Used  . Alcohol use Yes     Comment: 1-4 per week; eats 10 raisins daily soaked in Gin     Allergies   Penicillins   Review of Systems Review of Systems  All other systems reviewed and are negative.    Physical Exam Updated Vital Signs There were no vitals taken for this visit.  Physical Exam  Constitutional: She is oriented to person, place, and time. She appears well-developed and well-nourished. No distress.  Patient is awake and answering questions but is complaining of feeling jittery after  appendectomy. She has a hot potato voice  HENT:  Head: Normocephalic and atraumatic.  Significant amount of tongue edema and edema underneath the tongue in the sublingual area. Unable to evaluate the pharynx due to the significant tongue swelling. Occasional coughing when trying to swallow  Eyes: Conjunctivae and EOM are normal. Pupils are equal, round, and reactive to light.  Neck: Normal range of motion. Neck supple.  Cardiovascular: Normal rate, regular rhythm and intact distal pulses.   No murmur heard. Pulmonary/Chest: Effort normal and breath sounds normal. No respiratory distress. She has no wheezes. She has no rales.  Abdominal: Soft. She exhibits no distension. There is no tenderness. There is no rebound and no guarding.  Musculoskeletal: Normal range of motion. She exhibits no edema or tenderness.  Neurological: She is alert and oriented to person, place, and time.  Skin: Skin is warm and dry. No rash noted. No erythema.  Psychiatric: She has a normal mood and affect. Her behavior is normal.  Nursing note and vitals reviewed.    ED Treatments / Results  Labs (all labs ordered are listed, but only abnormal results are displayed) Labs Reviewed - No data to display  EKG  EKG Interpretation None       Radiology No results found.  Procedures Procedures (including critical care time)  Medications Ordered in ED Medications  famotidine (PEPCID) IVPB 20 mg premix (0 mg Intravenous Stopped 08/23/16 1518)  Racepinephrine HCl 2.25 % nebulizer solution 0.5 mL (0.5 mLs Nebulization Given by Other 08/23/16 1505)  methylPREDNISolone sodium succinate (SOLU-MEDROL) 125 mg/2 mL injection 125 mg (125 mg Intravenous Given 08/23/16 1440)     Initial Impression / Assessment and Plan / ED Course  I have reviewed the triage vital signs and the nursing notes.  Pertinent labs & imaging results that were available during my care of the patient were reviewed by me and considered in my  medical decision making (see chart for details).  Clinical Course   Patient is a 82 year old female presenting today with abrupt swelling of the tongue and difficulty swallowing. Patient thinks a bee flew in her mouth and stung her causing the swelling.  Upon arrival EMS gave her 2 rounds of epi and Benadryl without significant improvement. Upon arrival here patient has a largely edematous tongue and coughing when attempting to swallow. Also hot potato voice. Vital signs are stable and oxygen is 100% on room air.  Patient has no evidence of hives. She was given Solu-Medrol, Pepcid and anesthesia was consult it. By the time anesthesia arrived patient's coughing had improved and she felt like her tongue might be slightly better. At this time watchful waiting was decided upon. Will do racemic epi given her significant reaction to epi. Will watch closely and will need to be admitted for obs  3:49 PM Improvement of swelling.  Will admit.  CRITICAL CARE Performed by: Gwyneth Sprout Total critical care time: 30 minutes Critical care time was exclusive of separately billable procedures and treating other patients. Critical care was necessary to treat or prevent imminent or life-threatening deterioration. Critical care was time spent personally by me on the following activities: development of treatment plan with patient and/or surrogate as well as nursing, discussions with consultants, evaluation of patient's response to treatment, examination of patient, obtaining history from patient or surrogate, ordering and performing treatments and interventions, ordering and review of laboratory studies, ordering and review of radiographic studies, pulse oximetry and re-evaluation of patient's condition.   Final Clinical Impressions(s) / ED Diagnoses   Final diagnoses:  Allergic reaction, initial encounter  Tongue swelling    New Prescriptions New Prescriptions   No medications on file     Gwyneth Sprout, MD 08/23/16 1556

## 2016-08-24 DIAGNOSIS — T7840XA Allergy, unspecified, initial encounter: Secondary | ICD-10-CM

## 2016-08-24 DIAGNOSIS — I10 Essential (primary) hypertension: Secondary | ICD-10-CM

## 2016-08-24 DIAGNOSIS — T783XXA Angioneurotic edema, initial encounter: Secondary | ICD-10-CM | POA: Diagnosis not present

## 2016-08-24 LAB — BASIC METABOLIC PANEL
ANION GAP: 8 (ref 5–15)
BUN: 12 mg/dL (ref 6–20)
CO2: 20 mmol/L — ABNORMAL LOW (ref 22–32)
Calcium: 9.5 mg/dL (ref 8.9–10.3)
Chloride: 105 mmol/L (ref 101–111)
Creatinine, Ser: 0.83 mg/dL (ref 0.44–1.00)
GFR calc Af Amer: >60 mL/min (ref >60)
Glucose, Bld: 126 mg/dL — ABNORMAL HIGH (ref 65–99)
POTASSIUM: 3.7 mmol/L (ref 3.5–5.1)
SODIUM: 133 mmol/L — AB (ref 135–145)

## 2016-08-24 LAB — MRSA PCR SCREENING: MRSA BY PCR: NEGATIVE

## 2016-08-24 MED ORDER — EPINEPHRINE 0.3 MG/0.3ML IJ SOAJ: 0.3000 mg | Freq: Once | INTRAMUSCULAR | 0 refills | Status: DC | PRN

## 2016-08-24 MED ORDER — EPINEPHRINE 0.3 MG/0.3ML IJ SOAJ: 0.3000 mg | Freq: Once | INTRAMUSCULAR | 0 refills | Status: AC | PRN

## 2016-08-24 NOTE — Clinical Social Work Note (Addendum)
[facility] may not be able to transport patient back to facility due to their usual transport person leaving at 1:30 pm today. The only friends the patient says could pick her up live at the facility. Ms. Candice Willis[facility] going to try and find someone that could possibly pick her up. She would likely be billed for PTAR because she is only here for the bee sting.  Candice CourtSarah Tabita Willis, CSW [phone]  2:05 pm Spoke with Candice CzarLeigh again and she gave me a number for Candice Willis [phone]). Patient has been in contact with her and Candice StakesDemisha is working on getting someone to pick patient up from the hospital. Candice StakesDemisha will call CSW when there is a plan in place.  Candice CourtSarah [name], CSW [phone]  2:25 pm Per [facility], [facility] at 4:30 pm. Someone from the facility will call RN to get the epi pen education.   Candice CourtSarah [name], CSW [phone]

## 2016-08-24 NOTE — Clinical Social Work Note (Signed)
Clinical Social Work Assessment  Patient Details  Name: Candice Willis MRN: [id number] Date of Birth: August 31, 1924  Date of referral:  08/24/16               Reason for consult:  Discharge Planning                Permission sought to share information with:  Chartered certified accountant granted to share information::  Yes, Verbal Permission Granted  Name::        Agency::  [facility]  Relationship::     Contact Information:     Housing/Transportation Living arrangements for the past 2 months:  [place] of Information:  Patient, Medical Team, Facility Patient Interpreter Needed:  None Criminal Activity/Legal Involvement Pertinent to Current Situation/Hospitalization:  No - Comment as needed Significant Relationships:  Friend, Other Family Members Lives with:  Facility Resident Do you feel safe going back to the place where you live?  Yes Need for family participation in patient care:  Yes (Comment)  Care giving concerns:  Patient is from [facility].   Social Worker assessment / plan:  CSW met with patient. No supports at bedside. CSW introduced role. Patient confirmed that she is from [facility] and is agreeable to return. She is unsure how she will return. Per patient, she is supposed to discharge today. No documentation in chart confirms this. CSW called and notified Marjo Bicker at [facility] [phone] ext [phone]) and gave this information. Facility will be able to transport if she discharges before 2:00 pm today. No further concerns. CSW encouraged patient to contact CSW as needed. CSW will continue to follow patient for support and facilitate discharge back to ALF once medically stable.  Employment status:  Retired Nurse, adult PT Recommendations:  Not assessed at this time Information / Referral to community resources:  Other (Comment Required) (Plan is to return to  ALF.)  Patient/Family's Response to care:  Patient agreeable to returning to ALF. Patient's family and friends supportive and involved in patient's care. Patient appreciated social work intervention.  Patient/Family's Understanding of and Emotional Response to Diagnosis, Current Treatment, and Prognosis:  Patient understands that she will return to ALF once discharged. Patient appears happy with hospital care.  Emotional Assessment Appearance:  Appears stated age Attitude/Demeanor/Rapport:  Other (Pleasant) Affect (typically observed):  Accepting, Appropriate, Calm, Pleasant Orientation:  Oriented to Place, Oriented to Self, Oriented to  Time, Oriented to Situation (Per RN verbal report) Alcohol / Substance use:  Never Used Psych involvement (Current and /or in the community):  No (Comment)  Discharge Needs  Concerns to be addressed:  Care Coordination Readmission within the last 30 days:  No Current discharge risk:  None Barriers to Discharge:  No Barriers Identified   Candie Chroman, LCSW 08/24/2016, 11:50 AM

## 2016-08-24 NOTE — Discharge Instructions (Signed)
You were admitted to the hospital for swelling of your tongue and throat after being stung by a bee.  You received a lot of epinepherine and steroids that helped decrease the swelling.  You are doing much better now.  I want you to STOP taking your medicine called lisinopril because that might cause further swelling episodes.  I have also prescribed you an epipen that you can use if you ever develop swelling of your face or throat in the event you are stung by a bee or find a new allergy.  Please you this medicine as prescribed.   I would recommend following up with your doctor in the next couple of weeks for a check up.

## 2016-08-24 NOTE — Care Management Obs Status (Signed)
MEDICARE OBSERVATION STATUS NOTIFICATION   Patient Details  Name: Candice GrosJoan S Yerkes MRN: [id number] Date of Birth: [birth year]   Medicare Observation Status Notification Given:  Yes    CrutchfieldDerrill Memo, Marcene Laskowski M, RN 08/24/2016, 10:49 AM

## 2016-08-24 NOTE — Discharge Summary (Signed)
Family Medicine Teaching [facility] Discharge Summary  Patient name: Candice Willis Medical record number: [id number] Date of birth: 1923-12-02 Age: 82 y.o. Gender: female Date of Admission: 08/23/2016  Date of Discharge:08/24/16 Admitting Physician: Uvaldo Rising, MD  Primary Care Provider: Murray Hodgkins, MD Consultants: None  Indication for Hospitalization: Angioedema  Discharge Diagnoses/Problem List:  Patient Active Problem List   Diagnosis Date Noted  . Allergic reaction   . Angioedema 08/23/2016  . Angio-edema 08/23/2016  . Protein-calorie malnutrition (HCC) 02/28/2016  . Sore in mouth 02/22/2016  . PVC's (premature ventricular contractions) 01/05/2016  . Pressure ulcer of right buttock 09/16/2015  . Postoperative anemia due to acute blood loss 09/13/2015  . Hyponatremia 09/13/2015  . PAF (paroxysmal atrial fibrillation) (HCC) 09/11/2015  . Fall 09/10/2015  . Dizziness 09/10/2015  . Hip fracture, right (HCC) 09/10/2015  . Closed right hip fracture (HCC) 09/10/2015  . Hip fracture (HCC) 09/10/2015  . Diarrhea 09/10/2015  . Leg pain, right 06/16/2015  . Stomatitis 10/27/2014  . Vitamin D deficiency 10/24/2014  . Memory deficit   . Ulcer of hard palate   . Pain in right foot   . Thyroid nodule   . Pelvis fracture, right (HCC) 10/20/2014  . Hyperlipidemia 10/20/2014  . Essential hypertension 10/20/2014  . Osteoporosis 10/20/2014  . Constipation 10/20/2014    Disposition: Discharged to assisted living  Discharge Condition: Stable, improved  Discharge Exam:  General: A 82 year old female sitting up in bed appearing comfortable in no acute distress Eyes: PERRL, EOMI, non-injected ENTM: Tongue edema, minimal soft palatal edema, no uvular deviation,  Neck: supple, normal ROM Cardiovascular: Regular rate and rhythm, S1 and S2 present, no murmurs Respiratory: Normal work of breathing, clear to auscultation bilaterally, no wheezing or rhonchi Gastrointestinal:  Soft, non-tender, nondistended MSK: No clubbing, cyanosis, or edema Neuro: No focal deficits noted Psych: Normal mood and affect  Brief Hospital Course:  82 year old female who presented initially with severe tongue swelling and difficulty swallowing after being stung by a bee in the mouth.  In the ED she was given racemic epinephrine and Solu-Medrol 125, and Pepcid and symptoms began resolving.  She was admitted overnight for observation and had no acute overnight events. We held her lisinopril for the duration of her admission.  At the time of discharge her tongue and soft palatal swelling had resolved, and she had no difficulty swallowing.   She was discharged back to her assisted living.  Issues for Follow Up:  1. Angioedema- it's very likely that the cause of her angioedema was bee sting.  She should have post-hospital follow up with her doctor in 1-2 weeks.  2. Stop lisinopril due to angioedema  Significant Procedures: None  Significant Labs and Imaging:   Recent Labs Lab 08/23/16 1454 08/23/16 1539  WBC 9.3  --   HGB 13.0 12.9  HCT 38.1 38.0  PLT 340  --     Recent Labs Lab 08/23/16 1539 08/24/16 0950  NA 132* 133*  K 3.8 3.7  CL 96* 105  CO2  --  20*  GLUCOSE 146* 126*  BUN 17 12  CREATININE 0.80 0.83  CALCIUM  --  9.5    Results/Tests Pending at Time of Discharge: None  Discharge Medications:    Medication List    STOP taking these medications   lisinopril 5 MG tablet Commonly known as:  PRINIVIL,ZESTRIL     TAKE these medications   acetaminophen 500 MG tablet Commonly known as:  TYLENOL Take 500 mg  by mouth every 6 (six) hours as needed (for pain, headaches, or fever).   amLODipine 5 MG tablet Commonly known as:  NORVASC Take 5 mg by mouth daily.   aspirin 325 MG tablet Take 325 mg by mouth daily.   atorvastatin 20 MG tablet Commonly known as:  LIPITOR Take 20 mg by mouth daily.   Biotin 10 MG Caps Take 10 mg by mouth daily.   docusate  sodium 100 MG capsule Commonly known as:  COLACE Take 100 mg by mouth daily.   EPINEPHrine 0.3 mg/0.3 mL Soaj injection Commonly known as:  EPI-PEN Inject 0.3 mLs (0.3 mg total) into the muscle once as needed (anaphylaxis/allergic reaction).   ferrous gluconate 324 MG tablet Commonly known as:  FERGON Take 1 tablet (324 mg total) by mouth daily with breakfast.   lactose free nutrition Liqd Take 237 mLs by mouth daily with breakfast.   meclizine 12.5 MG tablet Commonly known as:  ANTIVERT Take 1 tablet (12.5 mg total) by mouth 3 (three) times daily as needed for dizziness.   Melatonin 3 MG Tabs Take 3 mg by mouth at bedtime as needed (for sleep).   multivitamin with minerals Tabs tablet Take 1 tablet by mouth daily.   polyethylene glycol packet Commonly known as:  MIRALAX / GLYCOLAX Take 17 g by mouth daily as needed (for constipation). MIX IN 4 OUNCES OF FLUID   PRESERVISION AREDS 2 PO Take 2 capsules by mouth daily.       Discharge Instructions: Please refer to Patient Instructions section of EMR for full details.  Patient was counseled important signs and symptoms that should prompt return to medical care, changes in medications, dietary instructions, activity restrictions, and follow up appointments.   Follow-Up Appointments:   Dann Ventress L Rudi Bunyard, MD 1Renne Musca0/02/2016, 12:52 PM PGY-1, [facility]

## 2016-08-24 NOTE — NC FL2 (Signed)
[place] MEDICAID FL2 LEVEL OF CARE SCREENING TOOL     IDENTIFICATION  Patient Name: Candice Willis Birthdate: 1924-01-14 Sex: female Admission Date (Current Location): 08/23/2016  [facility] and [place] Number:  Producer, television/film/video and Address:  [facility] [place], [address] [place], [place] [address]      Provider Number: [id number]  Attending Physician Name and Address:  Uvaldo Rising, MD  Relative Name and Phone Number:       Current Level of Care: Hospital Recommended Level of Care: Assisted Living Facility Prior Approval Number:    Date Approved/Denied:   PASRR Number:    Discharge Plan: Other (Comment) (ALF)    Current Diagnoses: Patient Active Problem List   Diagnosis Date Noted  . Allergic reaction   . Angioedema 08/23/2016  . Angio-edema 08/23/2016  . Protein-calorie malnutrition (HCC) 02/28/2016  . Sore in mouth 02/22/2016  . PVC's (premature ventricular contractions) 01/05/2016  . Pressure ulcer of right buttock 09/16/2015  . Postoperative anemia due to acute blood loss 09/13/2015  . Hyponatremia 09/13/2015  . PAF (paroxysmal atrial fibrillation) (HCC) 09/11/2015  . Fall 09/10/2015  . Dizziness 09/10/2015  . Hip fracture, right (HCC) 09/10/2015  . Closed right hip fracture (HCC) 09/10/2015  . Hip fracture (HCC) 09/10/2015  . Diarrhea 09/10/2015  . Leg pain, right 06/16/2015  . Stomatitis 10/27/2014  . Vitamin D deficiency 10/24/2014  . Memory deficit   . Ulcer of hard palate   . Pain in right foot   . Thyroid nodule   . Pelvis fracture, right (HCC) 10/20/2014  . Hyperlipidemia 10/20/2014  . Essential hypertension 10/20/2014  . Osteoporosis 10/20/2014  . Constipation 10/20/2014    Orientation RESPIRATION BLADDER Height & Weight     Self, Time, Situation, Place  Normal Continent Weight: 114 lb 10.2 oz (52 kg) Height:   (160 cm)  BEHAVIORAL SYMPTOMS/MOOD NEUROLOGICAL BOWEL NUTRITION STATUS   (None)  (None)  Continent Diet (See discharge summary)  AMBULATORY STATUS COMMUNICATION OF NEEDS Skin    (No PT eval) Verbally Normal                       Personal Care Assistance Level of Assistance  Bathing, Feeding, Dressing Bathing Assistance: Limited assistance Feeding assistance: Independent Dressing Assistance: Limited assistance     Functional Limitations Info  Sight, Hearing, Speech Sight Info: Adequate Hearing Info: Adequate Speech Info: Adequate    SPECIAL CARE FACTORS FREQUENCY  Blood pressure                    Contractures Contractures Info: Not present    Additional Factors Info  Code Status, Allergies Code Status Info: Full Allergies Info: Penicillins           Current Medications (08/24/2016):  This is the current hospital active medication list Current Facility-Administered Medications  Medication Dose Route Frequency Provider Last Rate Last Dose  . amLODipine (NORVASC) tablet 5 mg  5 mg Oral Daily Renne Musca, MD   5 mg at 08/24/16 0930  . aspirin tablet 325 mg  325 mg Oral Daily Renne Musca, MD   325 mg at 08/24/16 0930  . atorvastatin (LIPITOR) tablet 20 mg  20 mg Oral Daily Renne Musca, MD   20 mg at 08/24/16 0930  . docusate sodium (COLACE) capsule 100 mg  100 mg Oral Daily Renne Musca, MD   100 mg at 08/24/16 0930  .  enoxaparin (LOVENOX) injection 40 mg  40 mg Subcutaneous Q24H Renne Musca, MD   40 mg at 08/23/16 2106  . ferrous gluconate (FERGON) tablet 324 mg  324 mg Oral Q breakfast Renne Musca, MD   324 mg at 08/24/16 0930  . Melatonin TABS 3 mg  3 mg Oral QHS PRN Renne Musca, MD         Discharge Medications: Please see discharge summary for a list of discharge medications.  Relevant Imaging Results:  Relevant Lab Results:   Additional Information SS#: [id number]  Margarito Liner, LCSW

## 2016-08-24 NOTE — Care Management Note (Signed)
Case Management Note  Patient Details  Name: Candice Willis MRN: [id number] Date of Birth: Apr 15, 1924  Subjective/Objective:  82 y.o. F admitted 08/23/2016 [facility], with Angioedema. She suffered a Bee sting which resulted in Tongue swelling and difficulty breathing. Lives Independently at ALF. No CM needs at this time.                   Action/Plan: Discharge today   Expected Discharge Date:                  Expected Discharge Plan:  Home/Self Care (pt is Ind in ALF)  In-House Referral:  Clinical Social Work  Discharge planning Services  CM Consult  Post Acute Care Choice:  NA Choice offered to:  Patient  DME Arranged:  N/A DME Agency:     HH Arranged:  NA HH Agency:  NA  Status of Service:  Completed, signed off  If discussed at Long Length of Stay Meetings, dates discussed:    Additional Comments: Plan [facility]. No CM needs.   CrutchfieldDerrill Memo, Kaytlyn Din M, RN 08/24/2016, 10:46 AM

## 2016-09-08 ENCOUNTER — Encounter: Payer: Self-pay | Admitting: Nurse Practitioner

## 2016-09-08 ENCOUNTER — Non-Acute Institutional Stay: Payer: Medicare Other | Admitting: Nurse Practitioner

## 2016-09-08 DIAGNOSIS — H3530 Unspecified macular degeneration: Secondary | ICD-10-CM | POA: Diagnosis not present

## 2016-09-08 DIAGNOSIS — R413 Other amnesia: Secondary | ICD-10-CM

## 2016-09-08 DIAGNOSIS — T7840XS Allergy, unspecified, sequela: Secondary | ICD-10-CM

## 2016-09-08 DIAGNOSIS — I480 Paroxysmal atrial fibrillation: Secondary | ICD-10-CM

## 2016-09-08 DIAGNOSIS — R195 Other fecal abnormalities: Secondary | ICD-10-CM | POA: Insufficient documentation

## 2016-09-08 DIAGNOSIS — I10 Essential (primary) hypertension: Secondary | ICD-10-CM | POA: Diagnosis not present

## 2016-09-08 DIAGNOSIS — K121 Other forms of stomatitis: Secondary | ICD-10-CM | POA: Diagnosis not present

## 2016-09-08 DIAGNOSIS — E871 Hypo-osmolality and hyponatremia: Secondary | ICD-10-CM | POA: Diagnosis not present

## 2016-09-08 DIAGNOSIS — E041 Nontoxic single thyroid nodule: Secondary | ICD-10-CM

## 2016-09-08 DIAGNOSIS — H9190 Unspecified hearing loss, unspecified ear: Secondary | ICD-10-CM | POA: Diagnosis not present

## 2016-09-08 DIAGNOSIS — K5900 Constipation, unspecified: Secondary | ICD-10-CM

## 2016-09-08 DIAGNOSIS — H903 Sensorineural hearing loss, bilateral: Secondary | ICD-10-CM | POA: Insufficient documentation

## 2016-09-08 NOTE — Assessment & Plan Note (Signed)
Magic mouth wash 5cc s/s ac and hs x 2 weeks.

## 2016-09-08 NOTE — Assessment & Plan Note (Signed)
Chronic. Last TSH 1.73 09/18/15

## 2016-09-08 NOTE — Assessment & Plan Note (Signed)
Stable, continue Colace daily, MiraLax prn.   

## 2016-09-08 NOTE — Assessment & Plan Note (Addendum)
Controlled, continue Amlodipine 5mg  daily. Better in feeling lightheadedness since blood pressures is better controlled, monitor Bp bid.

## 2016-09-08 NOTE — Assessment & Plan Note (Signed)
R+L, Ophthalmology q 6 weeks.

## 2016-09-08 NOTE — Assessment & Plan Note (Signed)
Last Na 134 03/07/16

## 2016-09-08 NOTE — Assessment & Plan Note (Signed)
Stable, AL for care assistance.   

## 2016-09-08 NOTE — Assessment & Plan Note (Signed)
R 90% hearing loss, L hearing aid.

## 2016-09-08 NOTE — Assessment & Plan Note (Signed)
Heart rate is in control, continue Amlodipine 5mg, ASA, Statin.   

## 2016-09-08 NOTE — Progress Notes (Signed)
Patient ID: Candice Willis, female   DOB: Mar 21, 1924, 82 y.o.   MRN: [id number]  Location:   [facility]  Nursing Home Room Number: 822   Place of Service:  [facility]  Provider: [facility] Xana Bradt NP  Murray Hodgkins, MD  Patient Care Team: Kimber Relic, MD as PCP - General (Internal Medicine) Venita Lick, MD as Consulting Physician (Orthopedic Surgery) Marykay Lex, MD as Consulting Physician (Cardiology)  Extended Emergency Contact Information Primary Emergency Contact: [name],Penny CT [place] Home Phone: [phone] Mobile Phone: [phone] Relation: Niece Secondary Emergency Contact: [name],[name]  [place] Mobile Phone: [phone] Relation: Friend  Code Status:  DNR Goals of care: Advanced Directive information Advanced Directives 09/08/2016  Does patient have an advance directive? -  Type of Estate agent of [place];Out of facility DNR (pink MOST or yellow form)  Does patient want to make changes to advanced directive? No - Patient declined  Copy of advanced directive(s) in chart? No - copy requested  Would patient like information on creating an advanced directive? -     Chief Complaint  Patient presents with  . Establish Care    HPI:  Pt is a 82 y.o. female seen today for evaluation of chronic medical conditions.   Hx of hyponatremia, Na 130s, blood pressure is controlled on Amlodipine 5mg  daily, takes Fe daily, last Hgb 12.9 08/23/16. Afib, heart rate is in control, on ASA and statin.   ED 08/23/16 for evaluation of allergic reaction to bee sting.   Past Medical History:  Diagnosis Date  . Arteriosclerotic cardiovascular disease   . Bee sting reaction 08/23/2016   Tongue swelling and difficulty swallowing /notes 08/23/2016  . Constipation   . Degenerative joint disease of right hip 10/15/14  . Dysrhythmia    Paroxysmal Atrial Fibrillation  . Fracture of multiple pubic rami (HCC) 10/15/14   Right inferior  and superior pubic rami  . HOH (hard of hearing)    Wears bilateral hearing aids  . Hyperlipidemia   . Hypertension   . Macular degeneration   . Memory deficit   . Osteoporosis   . Pain in right foot   . Palpitations   . Paroxysmal atrial fibrillation (HCC)   . Thyroid nodule    Status post surgery  . Ulcer of hard palate   . Vertigo    Past Surgical History:  Procedure Laterality Date  . ABDOMINAL HYSTERECTOMY  1968  . APPENDECTOMY  1940's  . COLONOSCOPY    . FOOT SURGERY Left   . HARDWARE REMOVAL Right 02/16/2016   Procedure: HARDWARE REMOVAL RT HIP;  Surgeon: Sheral Apley, MD;  Location: [facility] OR;  Service: Orthopedics;  Laterality: Right;  . HIP ARTHROPLASTY Right 02/16/2016   Procedure: ARTHROPLASTY BIPOLAR HIP (HEMIARTHROPLASTY);  Surgeon: Sheral Apley, MD;  Location: [facility] OR;  Service: Orthopedics;  Laterality: Right;  . INTRAMEDULLARY (IM) NAIL INTERTROCHANTERIC Right 09/11/2015   Procedure: INTRAMEDULLARY (IM) NAIL INTERTROCHANTRIC;  Surgeon: Sheral Apley, MD;  Location: [facility] OR;  Service: Orthopedics;  Laterality: Right;  . KNEE SURGERY      Allergies  Allergen Reactions  . Benadryl [Diphenhydramine] Other (See Comments)    Muscular degeneration  . Penicillins Other (See Comments)    Has patient had a PCN reaction causing immediate rash, facial/tongue/throat swelling, SOB or lightheadedness with hypotension: Unknown Has patient had a PCN reaction causing severe rash involving mucus membranes or skin necrosis: Unknown Has patient had a PCN reaction that required  hospitalization: Unknown Has patient had a PCN reaction occurring within the last 10 years: No If all of the above answers are "NO", then may proceed with Cephalosporin use.       Medication List       Accurate as of 09/08/16  3:33 PM. Always use your most recent med list.          acetaminophen 500 MG tablet Commonly known as:  TYLENOL Take 500 mg by mouth every 6 (six) hours as needed (for  pain, headaches, or fever).   amLODipine 5 MG tablet Commonly known as:  NORVASC Take 5 mg by mouth daily.   aspirin 325 MG tablet Take 325 mg by mouth daily.   atorvastatin 20 MG tablet Commonly known as:  LIPITOR Take 20 mg by mouth daily.   Biotin 10 MG Caps Take 10 mg by mouth daily.   docusate sodium 100 MG capsule Commonly known as:  COLACE Take 100 mg by mouth daily.   EPINEPHrine 0.3 mg/0.3 mL Soaj injection Commonly known as:  EPI-PEN Inject 0.3 mLs (0.3 mg total) into the muscle once as needed (anaphylaxis/allergic reaction).   ferrous gluconate 324 MG tablet Commonly known as:  FERGON Take 1 tablet (324 mg total) by mouth daily with breakfast.   lactose free nutrition Liqd Take 237 mLs by mouth daily with breakfast.   meclizine 12.5 MG tablet Commonly known as:  ANTIVERT Take 1 tablet (12.5 mg total) by mouth 3 (three) times daily as needed for dizziness.   Melatonin 3 MG Tabs Take 3 mg by mouth at bedtime as needed (for sleep).   multivitamin with minerals Tabs tablet Take 1 tablet by mouth daily.   polyethylene glycol packet Commonly known as:  MIRALAX / GLYCOLAX Take 17 g by mouth daily as needed (for constipation). MIX IN 4 OUNCES OF FLUID   PRESERVISION AREDS 2 PO Take 2 capsules by mouth daily.       Review of Systems  Constitutional: Negative for chills, diaphoresis and fever.  HENT: Positive for hearing loss. Negative for congestion, ear discharge, ear pain, nosebleeds and sore throat.        R ear, 90% hearing loss. Left hearing aid. Mouth sore from bee sting.   Eyes: Negative for photophobia and pain.       Macular degeneration R+L  Respiratory: Negative for cough, shortness of breath, wheezing and stridor.   Cardiovascular: Negative for chest pain, palpitations and leg swelling.       RLE  Gastrointestinal: Negative for abdominal pain, constipation, diarrhea, nausea and vomiting.  Genitourinary: Negative for dysuria, frequency and  urgency.  Musculoskeletal: Negative for back pain, myalgias and neck pain.       Right hip pain with weight bearing, improved.   Skin: Negative for rash.       Right buttock stage II pressure ulcer healed, mild erythema in the area. Right hip surgical incision healed.   Neurological: Negative for dizziness, tremors, seizures, weakness and headaches.  Hematological: Does not bruise/bleed easily.  Psychiatric/Behavioral: Negative for hallucinations and suicidal ideas. The patient is not nervous/anxious.        Mildly confusion of time and place.     Immunization History  Administered Date(s) Administered  . PPD Test 02/18/2016  . Tdap 10/07/2012   Pertinent  Health Maintenance Due  Topic Date Due  . PNA vac Low Risk Adult (1 of 2 - PCV13) 04/15/1991  . INFLUENZA VACCINE  06/21/2016  . DEXA SCAN  Completed  Fall Risk  10/24/2014  Falls in the past year? Yes  Number falls in past yr: 1  Injury with Fall? Yes  Risk Factor Category  High Fall Risk  Risk for fall due to : History of fall(s)   Functional Status Survey:    Vitals:   09/08/16 1431  BP: (!) 150/72  Pulse: 71  Resp: 20  Temp: 97.6 F (36.4 C)  SpO2: 98%  Weight: 115 lb 3.2 oz (52.3 kg)  Height: 5\' 3"  (1.6 m)   Body mass index is 20.41 kg/m. Physical Exam  Constitutional: She appears well-developed and well-nourished. No distress.  HENT:  Head: Normocephalic and atraumatic.  Right Ear: External ear normal.  Left Ear: External ear normal.  Nose: Nose normal.  Mouth/Throat: Oropharynx is clear and moist. No oropharyngeal exudate.   the sore area on the roof of mouth, noted erythematous area about a quarter size, the patient stated it has been about 2-3 days.   Eyes: Conjunctivae and EOM are normal. Pupils are equal, round, and reactive to light. Right eye exhibits no discharge. Left eye exhibits no discharge. No scleral icterus.  Macular degeneration R+L, Ophthalmology q 6 weeks. Right side tong sore from bee  sting.   Neck: Normal range of motion. Neck supple. No JVD present. No tracheal deviation present. No thyromegaly present.  Cardiovascular: Normal rate, regular rhythm and intact distal pulses.  Exam reveals no gallop and no friction rub.   No murmur heard. Pulmonary/Chest: Effort normal and breath sounds normal. No stridor. No respiratory distress. She has no wheezes. She has no rales. She exhibits no tenderness.  Abdominal: Soft. Bowel sounds are normal. She exhibits no distension and no mass. There is no tenderness. There is no rebound and no guarding.  Musculoskeletal: She exhibits tenderness. She exhibits no edema.  Right hip ROM with pain and weight bearing, improved.   Lymphadenopathy:    She has no cervical adenopathy.  Neurological: She is alert. She displays normal reflexes. No cranial nerve deficit. She exhibits normal muscle tone. Coordination normal.  Skin: Skin is warm and dry. No rash noted. She is not diaphoretic. No erythema. No pallor.  Right buttock stage II pressure ulcer healed, mild erythema in the area. Right hip surgical incisions healed.    Psychiatric: She has a normal mood and affect. Her behavior is normal. Judgment and thought content normal.    Labs reviewed:  Recent Labs  02/05/16 1333  07/04/16 1140 08/23/16 1539 08/24/16 0950  NA 137  < > 134* 132* 133*  K 4.1  < > 4.4 3.8 3.7  CL 102  --  101 96* 105  CO2 23  --  28  --  20*  GLUCOSE 87  --  78 146* 126*  BUN 15  < > 17 17 12   CREATININE 0.78  < > 0.71 0.80 0.83  CALCIUM 9.9  --  9.6  --  9.5  < > = values in this interval not displayed.  Recent Labs  09/18/15 02/05/16 1333 02/23/16  AST 61* 30 32  ALT 48* 26 35  ALKPHOS 127* 90 95  BILITOT  --  0.7  --   PROT  --  7.2  --   ALBUMIN  --  4.2  --     Recent Labs  09/10/15 1835  02/05/16 1333 02/23/16 07/04/16 1140 08/23/16 1454 08/23/16 1539  WBC 4.5  < > 7.1 8.7 5.7 9.3  --   NEUTROABS 3.2  --  5.0  --   --  5.0  --   HGB 13.0   < > 12.7 9.5* 12.2 13.0 12.9  HCT 37.0  < > 37.7 27* 36.3 38.1 38.0  MCV 88.7  < > 89.5  --  91.7 92.0  --   PLT 223  < > 300 344 292 340  --   < > = values in this interval not displayed. Lab Results  Component Value Date   TSH 1.73 09/18/2015   No results found for: HGBA1C Lab Results  Component Value Date   CHOL 92 09/22/2015   HDL 40 09/22/2015   LDLCALC 41 09/22/2015   TRIG 57 09/22/2015    Significant Diagnostic Results in last 30 days:  No results found.  Assessment/Plan: Essential hypertension Controlled, continue Amlodipine 5mg  daily. Better in feeling lightheadedness since blood pressures is better controlled, monitor Bp bid.   PAF (paroxysmal atrial fibrillation) (HCC) Heart rate is in control, continue Amlodipine 5mg , ASA, Statin.   Constipation Stable, continue Colace daily, MiraLax prn.   Thyroid nodule Chronic. Last TSH 1.73 09/18/15  Memory deficit Stable, AL for care assistance.   Hyponatremia Last Na 134 03/07/16  Allergic reaction 08/23/16 bee sting, continue Epi-pen prn.   Stomatitis Magic mouth wash 5cc s/s ac and hs x 2 weeks.   Heme + stool True heme positive stools vs oral Fe, will update CBC, CMP, TSH, lipid panel.   Macular degeneration of both eyes R+L, Ophthalmology q 6 weeks.   Hearing decreased R 90% hearing loss, L hearing aid.      Family/ staff Communication: . Continue AL for care assistance  Labs/tests ordered:  CBC, CMP, TSH, lipid panel.

## 2016-09-08 NOTE — Assessment & Plan Note (Signed)
08/23/16 bee sting, continue Epi-pen prn.

## 2016-09-08 NOTE — Assessment & Plan Note (Signed)
True heme positive stools vs oral Fe, will update CBC, CMP, TSH, lipid panel.

## 2016-09-13 LAB — CBC AND DIFFERENTIAL
HEMATOCRIT: 34 % — AB (ref 36–46)
Hemoglobin: 11.6 g/dL — AB (ref 12.0–16.0)
PLATELETS: 302 10*3/uL (ref 150–399)
WBC: 4.8 10^3/mL

## 2016-09-13 LAB — HEPATIC FUNCTION PANEL
ALK PHOS: 57 U/L (ref 25–125)
ALT: 20 U/L (ref 7–35)
AST: 24 U/L (ref 13–35)
BILIRUBIN, TOTAL: 0.5 mg/dL

## 2016-09-13 LAB — TSH: TSH: 0.92 u[IU]/mL (ref <=5.90)

## 2016-09-13 LAB — BASIC METABOLIC PANEL
BUN: 16 mg/dL (ref 4–21)
CREATININE: 0.7 mg/dL (ref <=1.1)
Glucose: 76 mg/dL
Potassium: 4.3 mmol/L (ref 3.4–5.3)
SODIUM: 137 mmol/L (ref 137–147)

## 2016-09-13 LAB — LIPID PANEL
CHOLESTEROL: 139 mg/dL (ref 0–200)
HDL: 68 mg/dL (ref 35–70)
LDL Cholesterol: 60 mg/dL
TRIGLYCERIDES: 53 mg/dL (ref 40–160)

## 2016-09-14 ENCOUNTER — Other Ambulatory Visit: Payer: Self-pay | Admitting: *Deleted

## 2016-10-20 ENCOUNTER — Encounter: Payer: Self-pay | Admitting: Nurse Practitioner

## 2016-10-20 ENCOUNTER — Non-Acute Institutional Stay: Payer: Medicare Other | Admitting: Nurse Practitioner

## 2016-10-20 DIAGNOSIS — I480 Paroxysmal atrial fibrillation: Secondary | ICD-10-CM | POA: Diagnosis not present

## 2016-10-20 DIAGNOSIS — R413 Other amnesia: Secondary | ICD-10-CM

## 2016-10-20 DIAGNOSIS — E871 Hypo-osmolality and hyponatremia: Secondary | ICD-10-CM | POA: Diagnosis not present

## 2016-10-20 DIAGNOSIS — K5900 Constipation, unspecified: Secondary | ICD-10-CM | POA: Diagnosis not present

## 2016-10-20 DIAGNOSIS — I10 Essential (primary) hypertension: Secondary | ICD-10-CM | POA: Diagnosis not present

## 2016-10-20 NOTE — Progress Notes (Signed)
Location:  [facility] Room Number: 822 Place of Service:  ALF [phone]) Provider:  Mast, Manxie NP  Murray Hodgkins, MD  Patient Care Team: Kimber Relic, MD as PCP - General (Internal Medicine) Venita Lick, MD as Consulting Physician (Orthopedic Surgery) Marykay Lex, MD as Consulting Physician (Cardiology) Man Johnney Ou, NP as Nurse Practitioner (Internal Medicine)  Extended Emergency Contact Information Primary Emergency Contact: [name],Jenice CT [place] Home Phone: [phone] Mobile Phone: [phone] Relation: Niece Secondary Emergency Contact: [name],[name]  [place] Mobile Phone: [phone] Relation: Friend  Code Status:  DNR  Goals of care: Advanced Directive information Advanced Directives 10/20/2016  Does Patient Have a Medical Advance Directive? Yes  Type of Estate agent of [place];Out of facility DNR (pink MOST or yellow form)  Does patient want to make changes to medical advance directive? No - Patient declined  Copy of Healthcare Power of Attorney in Chart? No - copy requested  Would patient like information on creating a medical advance directive? -     Chief Complaint  Patient presents with  . Medical Management of Chronic Issues    HPI:  Pt is a 82 y.o. female seen today for medical management of chronic diseases.      Hx of hyponatremia, Na 130s, blood pressure is controlled on Amlodipine 5mg  daily, takes Fe daily, last Hgb 12.9 08/23/16. Afib, heart rate is in control, on ASA and statin.  Past Medical History:  Diagnosis Date  . Arteriosclerotic cardiovascular disease   . Bee sting reaction 08/23/2016   Tongue swelling and difficulty swallowing /notes 08/23/2016  . Constipation   . Degenerative joint disease of right hip 10/15/14  . Dysrhythmia    Paroxysmal Atrial Fibrillation  . Fracture of multiple pubic rami (HCC) 10/15/14   Right inferior and superior pubic rami    . HOH (hard of hearing)    Wears bilateral hearing aids  . Hyperlipidemia   . Hypertension   . Macular degeneration   . Memory deficit   . Osteoporosis   . Pain in right foot   . Palpitations   . Paroxysmal atrial fibrillation (HCC)   . Thyroid nodule    Status post surgery  . Ulcer of hard palate   . Vertigo    Past Surgical History:  Procedure Laterality Date  . ABDOMINAL HYSTERECTOMY  1968  . APPENDECTOMY  1940's  . COLONOSCOPY    . FOOT SURGERY Left   . HARDWARE REMOVAL Right 02/16/2016   Procedure: HARDWARE REMOVAL RT HIP;  Surgeon: Sheral Apley, MD;  Location: [facility] OR;  Service: Orthopedics;  Laterality: Right;  . HIP ARTHROPLASTY Right 02/16/2016   Procedure: ARTHROPLASTY BIPOLAR HIP (HEMIARTHROPLASTY);  Surgeon: Sheral Apley, MD;  Location: [facility] OR;  Service: Orthopedics;  Laterality: Right;  . INTRAMEDULLARY (IM) NAIL INTERTROCHANTERIC Right 09/11/2015   Procedure: INTRAMEDULLARY (IM) NAIL INTERTROCHANTRIC;  Surgeon: Sheral Apley, MD;  Location: [facility] OR;  Service: Orthopedics;  Laterality: Right;  . KNEE SURGERY      Allergies  Allergen Reactions  . Benadryl [Diphenhydramine] Other (See Comments)    Muscular degeneration  . Penicillins Other (See Comments)    Has patient had a PCN reaction causing immediate rash, facial/tongue/throat swelling, SOB or lightheadedness with hypotension: Unknown Has patient had a PCN reaction causing severe rash involving mucus membranes or skin necrosis: Unknown Has patient had a PCN reaction that required hospitalization: Unknown Has patient had a PCN reaction occurring within  the last 10 years: No If all of the above answers are "NO", then may proceed with Cephalosporin use.     Allergies as of 10/20/2016      Reactions   Benadryl [diphenhydramine] Other (See Comments)   Muscular degeneration   Penicillins Other (See Comments)   Has patient had a PCN reaction causing immediate rash, facial/tongue/throat swelling, SOB or  lightheadedness with hypotension: Unknown Has patient had a PCN reaction causing severe rash involving mucus membranes or skin necrosis: Unknown Has patient had a PCN reaction that required hospitalization: Unknown Has patient had a PCN reaction occurring within the last 10 years: No If all of the above answers are "NO", then may proceed with Cephalosporin use.      Medication List       Accurate as of 10/20/16 11:59 PM. Always use your most recent med list.          acetaminophen 500 MG tablet Commonly known as:  TYLENOL Take 500 mg by mouth every 6 (six) hours as needed (for pain, headaches, or fever).   amLODipine 5 MG tablet Commonly known as:  NORVASC Take 5 mg by mouth daily.   aspirin 325 MG tablet Take 325 mg by mouth daily.   atorvastatin 20 MG tablet Commonly known as:  LIPITOR Take 20 mg by mouth daily.   Biotin 10 MG Caps Take 10 mg by mouth daily.   docusate sodium 100 MG capsule Commonly known as:  COLACE Take 100 mg by mouth daily.   EPINEPHrine 0.3 mg/0.3 mL Soaj injection Commonly known as:  EPI-PEN Inject 0.3 mLs (0.3 mg total) into the muscle once as needed (anaphylaxis/allergic reaction).   ferrous gluconate 324 MG tablet Commonly known as:  FERGON Take 1 tablet (324 mg total) by mouth daily with breakfast.   lactose free nutrition Liqd Take 237 mLs by mouth daily with breakfast.   meclizine 12.5 MG tablet Commonly known as:  ANTIVERT Take 1 tablet (12.5 mg total) by mouth 3 (three) times daily as needed for dizziness.   Melatonin 3 MG Tabs Take 3 mg by mouth at bedtime as needed (for sleep).   multivitamin with minerals Tabs tablet Take 1 tablet by mouth daily.   polyethylene glycol packet Commonly known as:  MIRALAX / GLYCOLAX Take 17 g by mouth daily as needed (for constipation). MIX IN 4 OUNCES OF FLUID   PRESERVISION AREDS 2 PO Take 2 capsules by mouth daily.   traMADol 50 MG tablet Commonly known as:  ULTRAM Take 50 mg by  mouth every 6 (six) hours as needed for moderate pain.       Review of Systems  Constitutional: Negative for chills, diaphoresis and fever.  HENT: Positive for hearing loss. Negative for congestion, ear discharge, ear pain, nosebleeds and sore throat.        R ear, 90% hearing loss. Left hearing aid. Mouth sore from bee sting.   Eyes: Negative for photophobia and pain.       Macular degeneration R+L  Respiratory: Negative for cough, shortness of breath, wheezing and stridor.   Cardiovascular: Negative for chest pain, palpitations and leg swelling.       RLE  Gastrointestinal: Negative for abdominal pain, constipation, diarrhea, nausea and vomiting.  Genitourinary: Negative for dysuria, frequency and urgency.  Musculoskeletal: Negative for back pain, myalgias and neck pain.       Right hip pain with weight bearing, improved.   Skin: Negative for rash.  Right buttock stage II pressure ulcer healed, mild erythema in the area. Right hip surgical incision healed.   Neurological: Negative for dizziness, tremors, seizures, weakness and headaches.  Hematological: Does not bruise/bleed easily.  Psychiatric/Behavioral: Negative for hallucinations and suicidal ideas. The patient is not nervous/anxious.        Mildly confusion of time and place.     Immunization History  Administered Date(s) Administered  . Influenza-Unspecified 09/07/2016  . PPD Test 02/18/2016  . Tdap 10/07/2012   Pertinent  Health Maintenance Due  Topic Date Due  . PNA vac Low Risk Adult (1 of 2 - PCV13) 04/15/1991  . INFLUENZA VACCINE  Completed  . DEXA SCAN  Completed   Fall Risk  10/24/2014  Falls in the past year? Yes  Number falls in past yr: 1  Injury with Fall? Yes  Risk Factor Category  High Fall Risk  Risk for fall due to : History of fall(s)   Functional Status Survey:    Vitals:   10/20/16 1038  BP: 106/64  Pulse: 64  Resp: 20  Temp: 97.8 F (36.6 C)  Weight: 115 lb 3.2 oz (52.3 kg)    Height: 5\' 3"  (1.6 m)   Body mass index is 20.41 kg/m. Physical Exam  Constitutional: She appears well-developed and well-nourished. No distress.  HENT:  Head: Normocephalic and atraumatic.  Right Ear: External ear normal.  Left Ear: External ear normal.  Nose: Nose normal.  Mouth/Throat: Oropharynx is clear and moist. No oropharyngeal exudate.   the sore area on the roof of mouth, noted erythematous area about a quarter size, the patient stated it has been about 2-3 days.   Eyes: Conjunctivae and EOM are normal. Pupils are equal, round, and reactive to light. Right eye exhibits no discharge. Left eye exhibits no discharge. No scleral icterus.  Macular degeneration R+L, Ophthalmology q 6 weeks. Right side tong sore from bee sting.   Neck: Normal range of motion. Neck supple. No JVD present. No tracheal deviation present. No thyromegaly present.  Cardiovascular: Normal rate, regular rhythm and intact distal pulses.  Exam reveals no gallop and no friction rub.   No murmur heard. Pulmonary/Chest: Effort normal and breath sounds normal. No stridor. No respiratory distress. She has no wheezes. She has no rales. She exhibits no tenderness.  Abdominal: Soft. Bowel sounds are normal. She exhibits no distension and no mass. There is no tenderness. There is no rebound and no guarding.  Musculoskeletal: She exhibits tenderness. She exhibits no edema.  Right hip ROM with pain and weight bearing, improved.   Lymphadenopathy:    She has no cervical adenopathy.  Neurological: She is alert. She displays normal reflexes. No cranial nerve deficit. She exhibits normal muscle tone. Coordination normal.  Skin: Skin is warm and dry. No rash noted. She is not diaphoretic. No erythema. No pallor.  Right buttock stage II pressure ulcer healed, mild erythema in the area. Right hip surgical incisions healed.    Psychiatric: She has a normal mood and affect. Her behavior is normal. Judgment and thought content  normal.    Labs reviewed:  Recent Labs  02/05/16 1333  07/04/16 1140 08/23/16 1539 08/24/16 0950 09/13/16  NA 137  < > 134* 132* 133* 137  K 4.1  < > 4.4 3.8 3.7 4.3  CL 102  --  101 96* 105  --   CO2 23  --  28  --  20*  --   GLUCOSE 87  --  78 146*  126*  --   BUN 15  < > 17 17 12 16   CREATININE 0.78  < > 0.71 0.80 0.83 0.7  CALCIUM 9.9  --  9.6  --  9.5  --   < > = values in this interval not displayed.  Recent Labs  02/05/16 1333 02/23/16 09/13/16  AST 30 32 24  ALT 26 35 20  ALKPHOS 90 95 57  BILITOT 0.7  --   --   PROT 7.2  --   --   ALBUMIN 4.2  --   --     Recent Labs  02/05/16 1333  07/04/16 1140 08/23/16 1454 08/23/16 1539 09/13/16  WBC 7.1  < > 5.7 9.3  --  4.8  NEUTROABS 5.0  --   --  5.0  --   --   HGB 12.7  < > 12.2 13.0 12.9 11.6*  HCT 37.7  < > 36.3 38.1 38.0 34*  MCV 89.5  --  91.7 92.0  --   --   PLT 300  < > 292 340  --  302  < > = values in this interval not displayed. Lab Results  Component Value Date   TSH 0.92 09/13/2016   No results found for: HGBA1C Lab Results  Component Value Date   CHOL 139 09/13/2016   HDL 68 09/13/2016   LDLCALC 60 09/13/2016   TRIG 53 09/13/2016    Significant Diagnostic Results in last 30 days:  No results found.  Assessment/Plan Essential hypertension Controlled, continue Amlodipine 5mg  daily. 09/13/16 Na 137, K 4.3, Bun 16, creat 0.69   PAF (paroxysmal atrial fibrillation) (HCC) Heart rate is in control, continue Amlodipine 5mg , ASA, Statin.    Constipation Stable, continue Colace daily, MiraLax prn.    Memory deficit Stable, AL for care assistance.    Hyponatremia Resolved, last Na 137 09/13/16     Family/ staff Communication: AL  Labs/tests ordered:  none

## 2016-10-20 NOTE — Assessment & Plan Note (Signed)
Resolved, last Na 137 09/13/16

## 2016-10-20 NOTE — Assessment & Plan Note (Addendum)
Controlled, continue Amlodipine 5mg  daily. 09/13/16 Na 137, K 4.3, Bun 16, creat 0.69

## 2016-10-20 NOTE — Assessment & Plan Note (Signed)
Stable, AL for care assistance.

## 2016-10-20 NOTE — Assessment & Plan Note (Signed)
Heart rate is in control, continue Amlodipine 5mg , ASA, Statin.

## 2016-10-20 NOTE — Assessment & Plan Note (Signed)
Stable, continue Colace daily, MiraLax prn.

## 2016-12-18 IMAGING — CR DG HIP (WITH OR WITHOUT PELVIS) 2-3V*R*
3 series · 3 of 3 positions shown · non-contrast
Comparison: 10/15/2014

CLINICAL DATA: Lost balance and fell while hanging a picture on
wall, landing on RIGHT hip, felt a pop, RIGHT hip pain, increased
pain with movement and palpation, initial encounter

EXAM:
DG HIP (WITH OR WITHOUT PELVIS) 2-3V RIGHT

[w hip lat right]
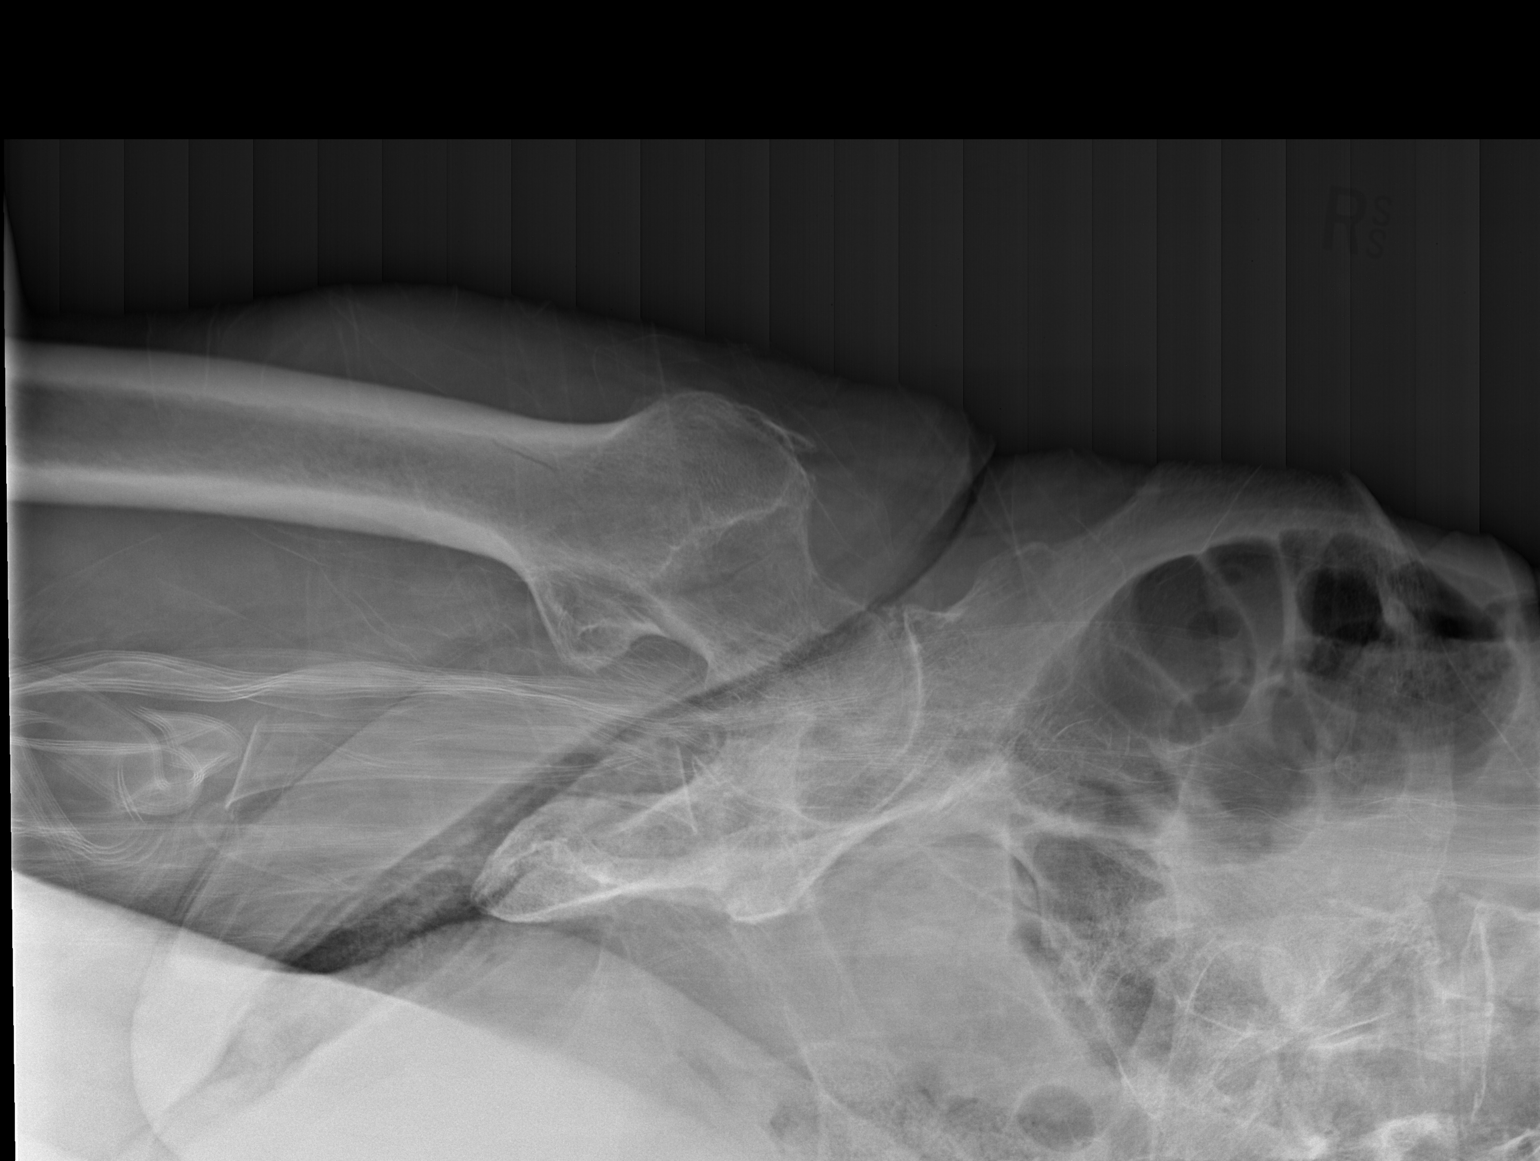

[x pelvis]
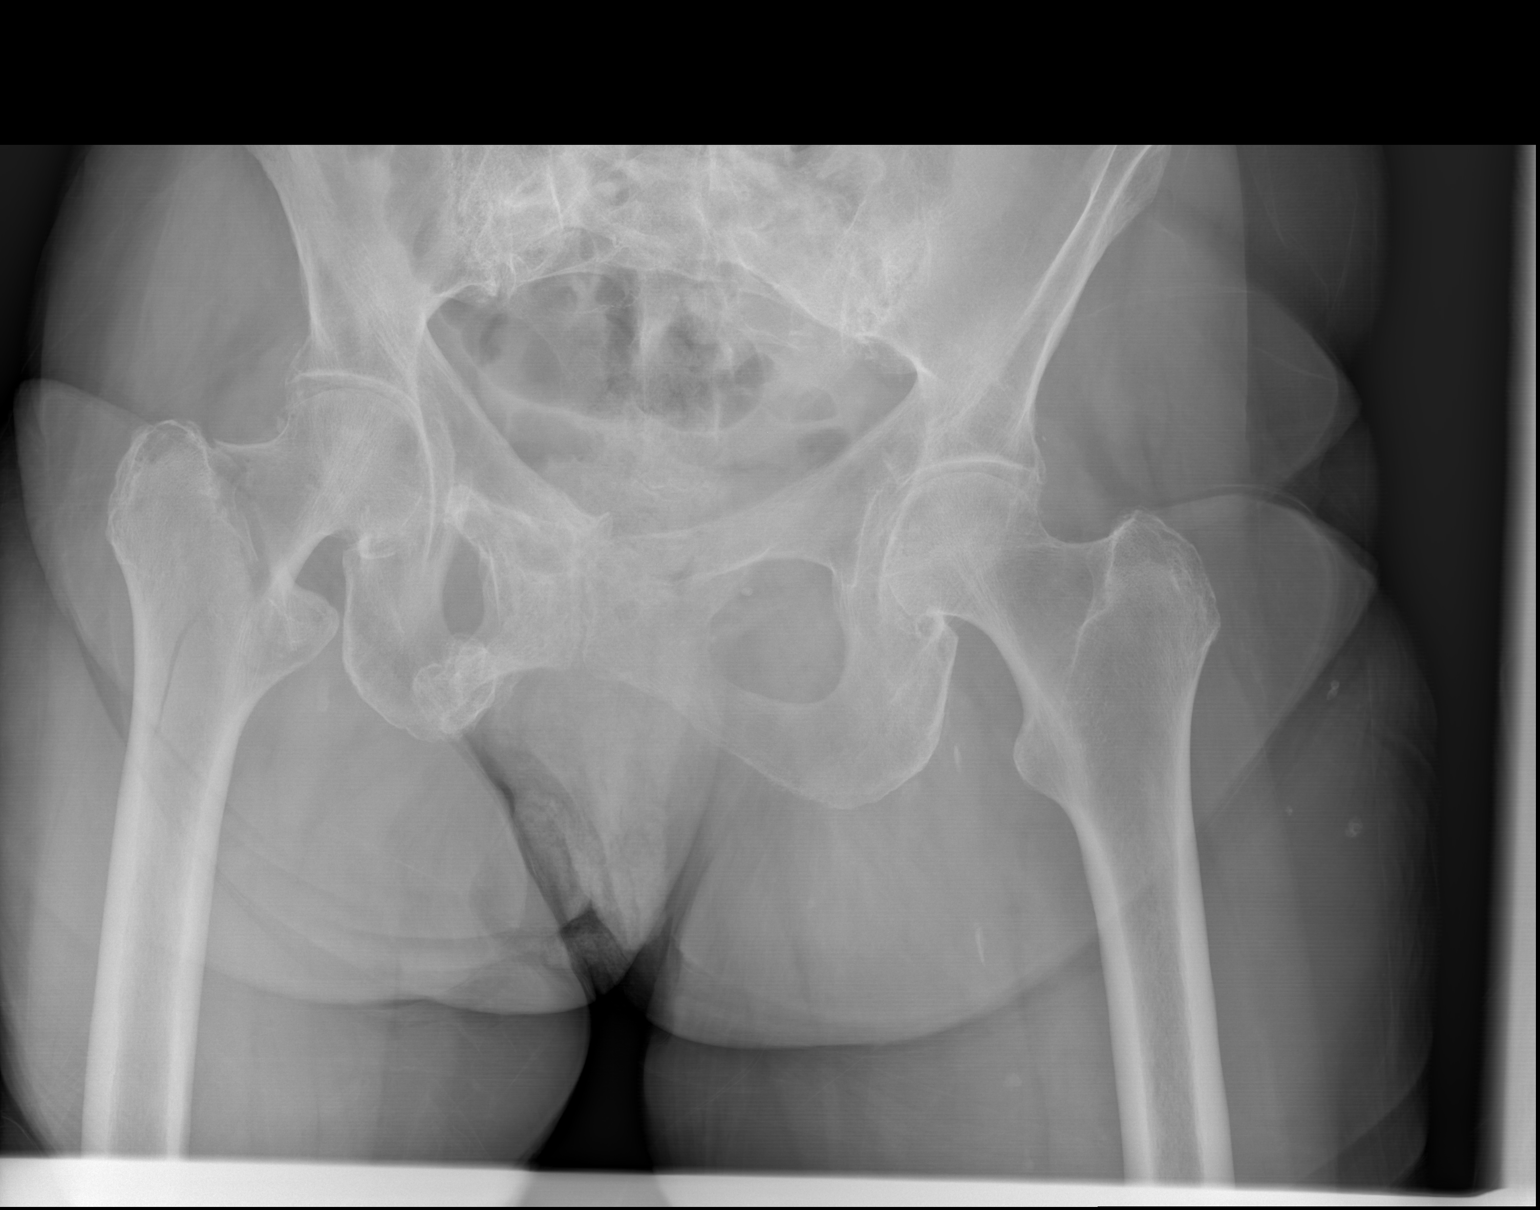

[x hip ap right]
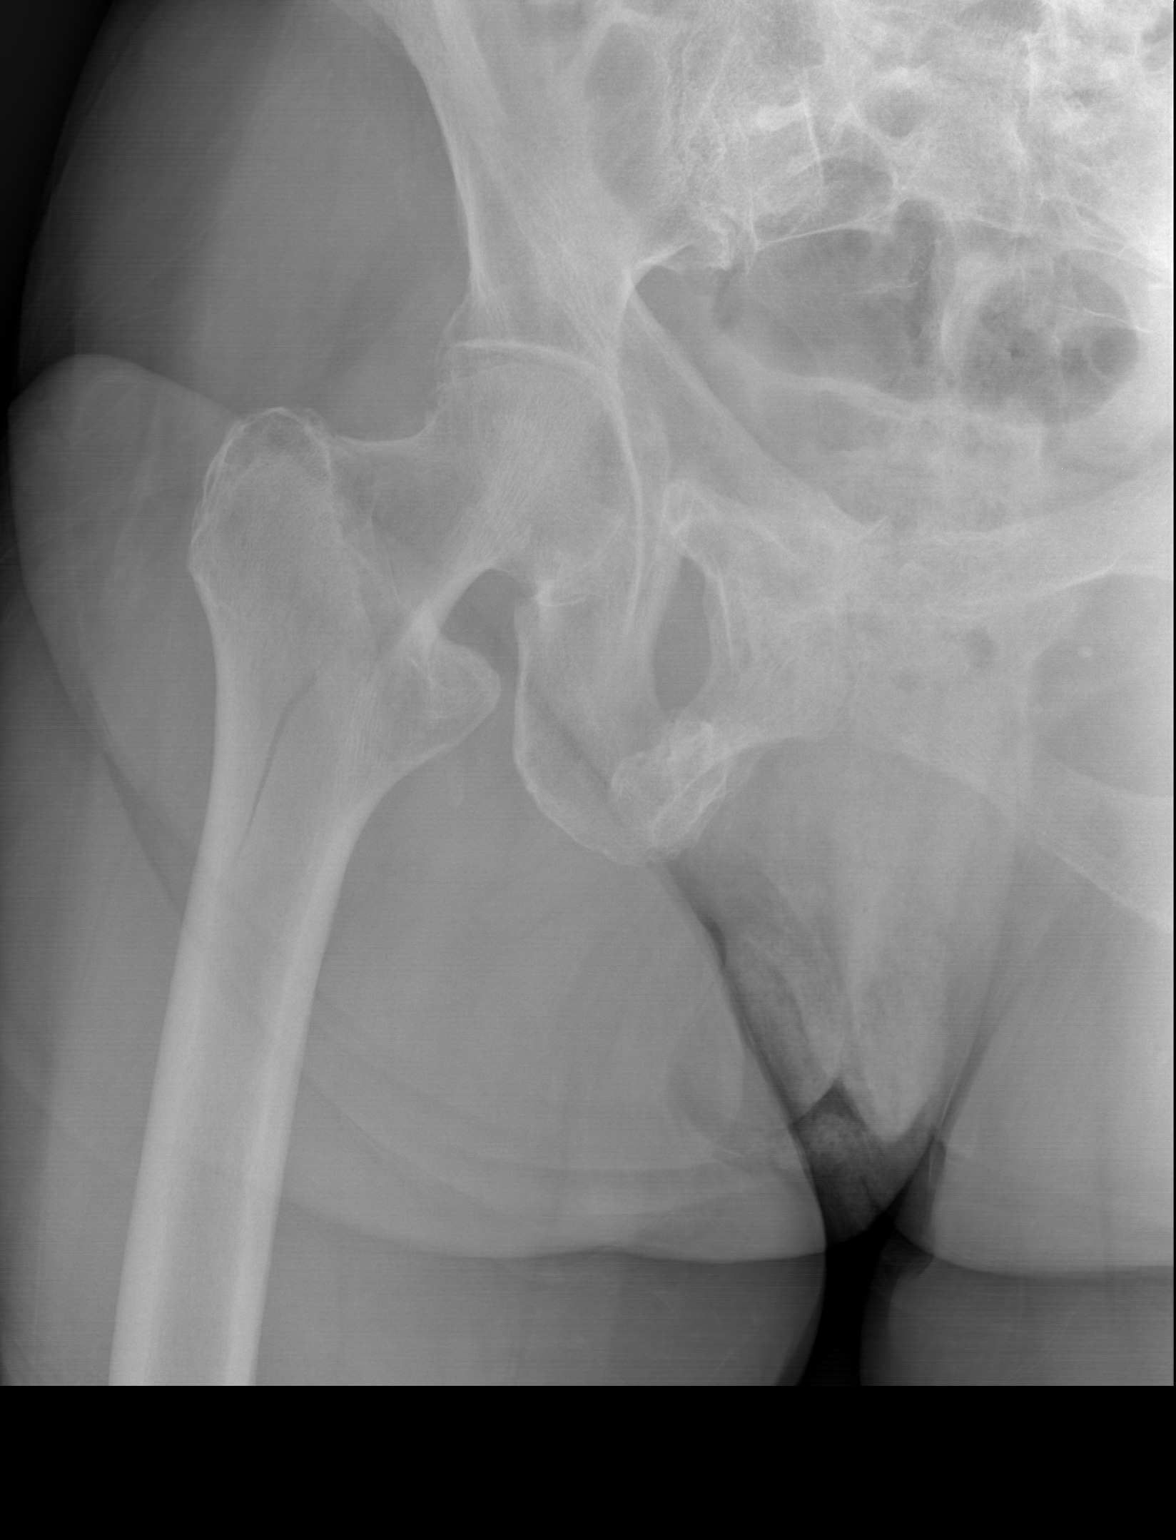

[3 of 3 positions shown; findings below may reference images not displayed]

FINDINGS: Osseous demineralization.

Nondisplaced intertrochanteric fracture RIGHT femur with a
nondisplaced fracture plane extending subtrochanteric.

No dislocation.

Narrowing of the hip joints bilaterally.

Old healed pubic rami fractures on RIGHT.

Remaining pelvis appears intact.
IMPRESSION: Osseous demineralization with nondisplaced intertrochanteric
fracture RIGHT femur extending subtrochanteric.

Old RIGHT pubic rami fractures.

## 2016-12-18 IMAGING — CT CT HEAD W/O CM
2 series · 17 of 30 positions shown, 20 images · non-contrast
Comparison: 10/07/2012

CLINICAL DATA: Lost balance while hanging a picture on the wall,
fell landing on RIGHT hip, dizziness, suffered a RIGHT hip fracture,
nausea, hypertension, atrial fibrillation, former smoker

EXAM:
CT HEAD WITHOUT CONTRAST
TECHNIQUE: Contiguous axial images were obtained from the base of the skull
through the vertex without intravenous contrast.

[Series 2: head w/o · axial · non-contrast · 0.41mm/px · z∈[-177,-52]mm · 9 of 33 slices shown, 12 images]
[im 4/33  brain]
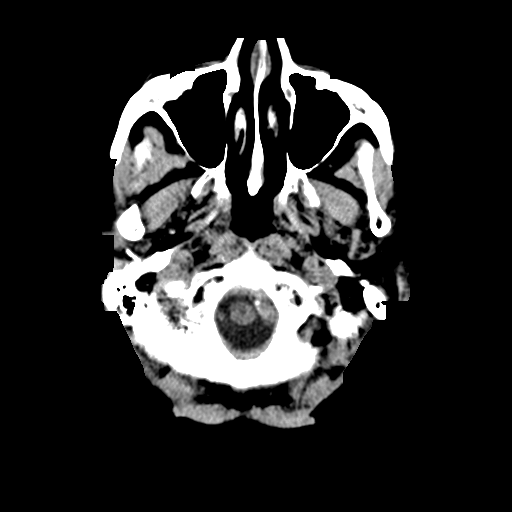
[im 4/33  bone]
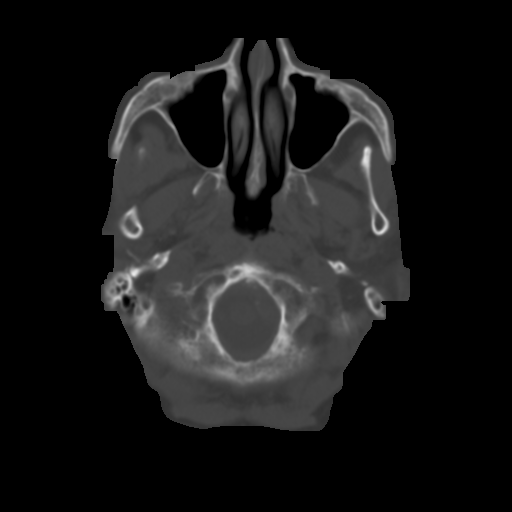
[im 7/33  brain]
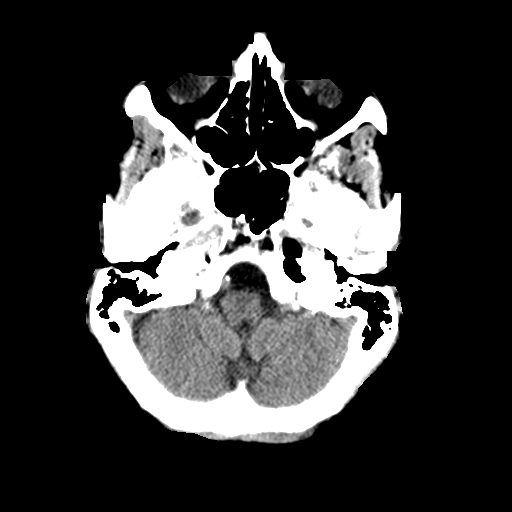
[im 10/33  brain]
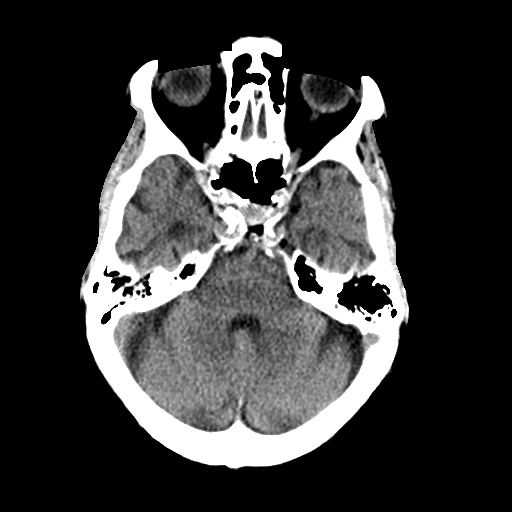
[im 13/33  brain]
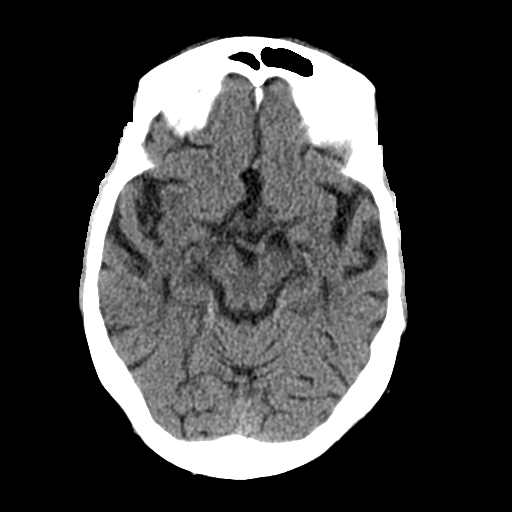
[im 17/33  brain]
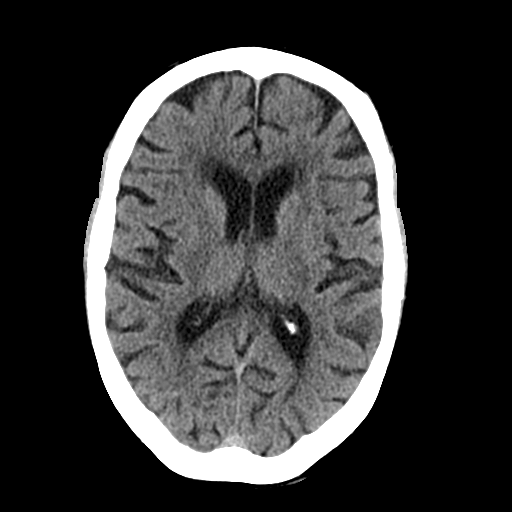
[im 17/33  bone]
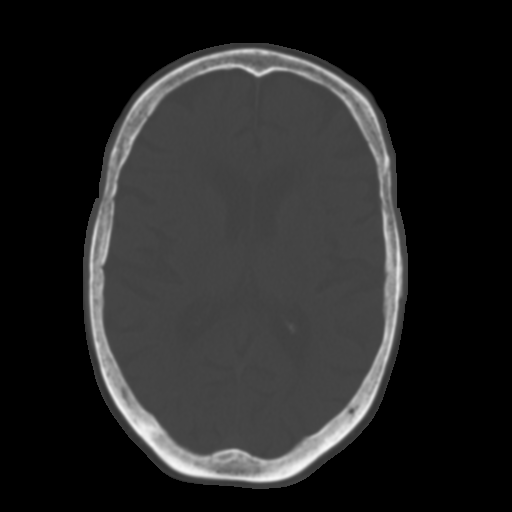
[im 20/33  brain]
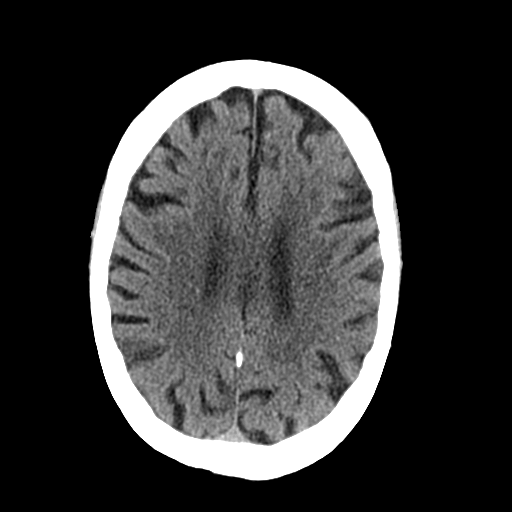
[im 23/33  brain]
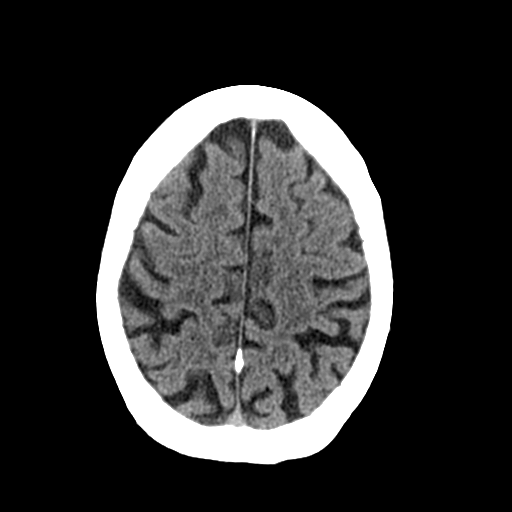
[im 26/33  brain]
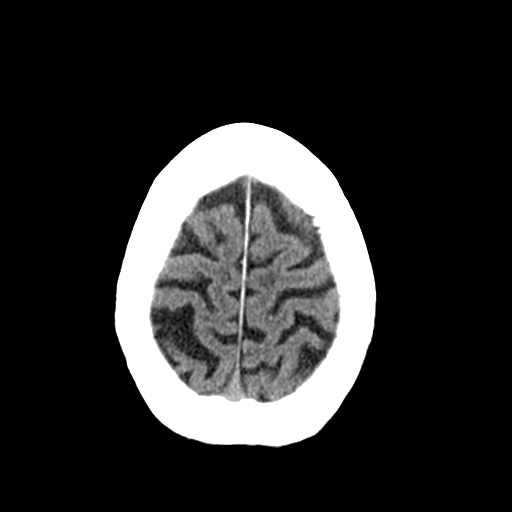
[im 29/33  brain]
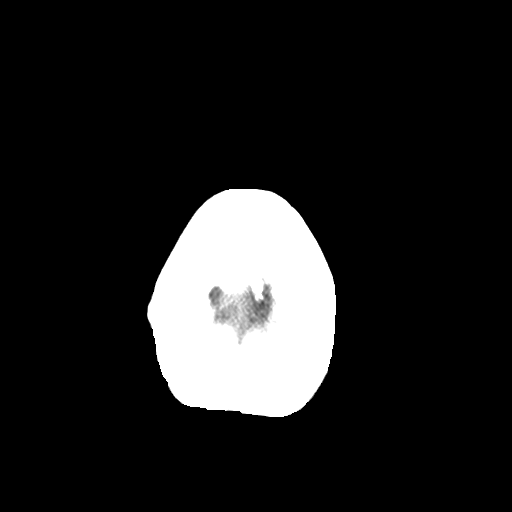
[im 29/33  bone]
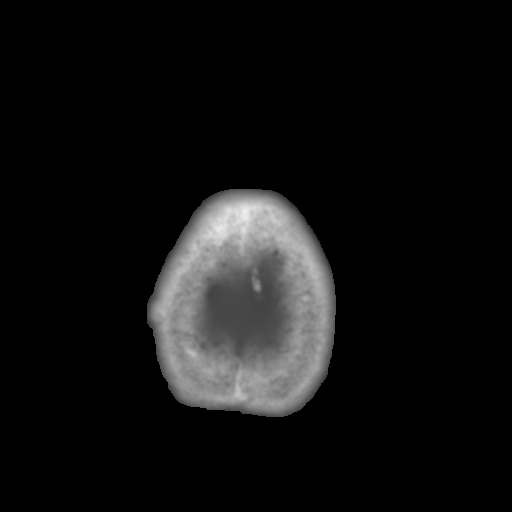

[Series 4: bone windows · axial · 0.41mm/px · z∈[-177,-51]mm · 8 of 54 slices shown]
[im 6/54  bone]
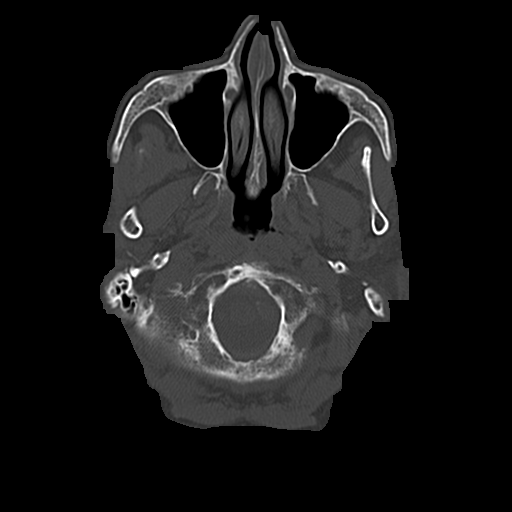
[im 12/54  bone]
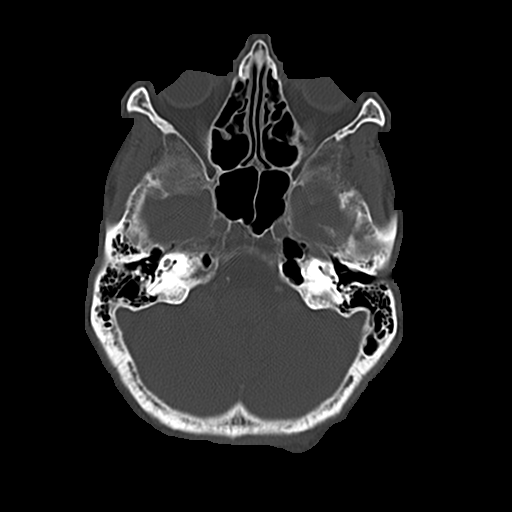
[im 18/54  bone]
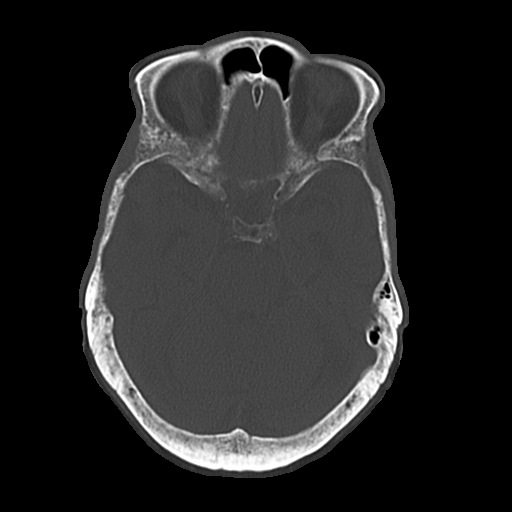
[im 24/54  bone]
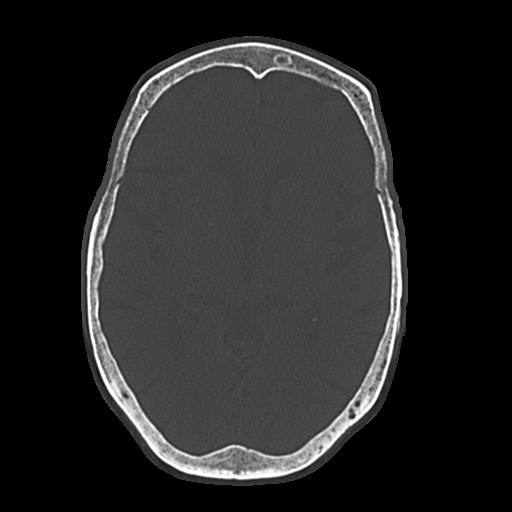
[im 30/54  bone]
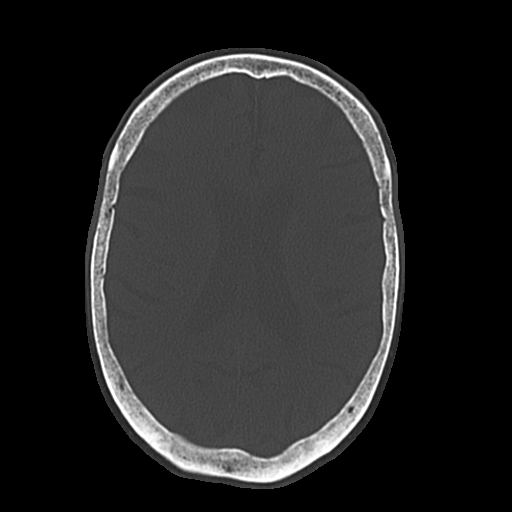
[im 36/54  bone]
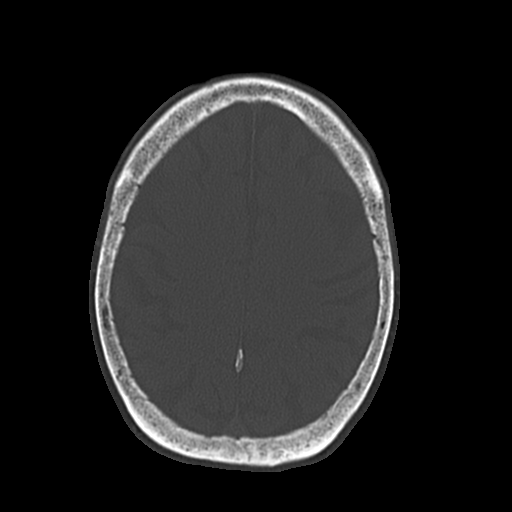
[im 42/54  bone]
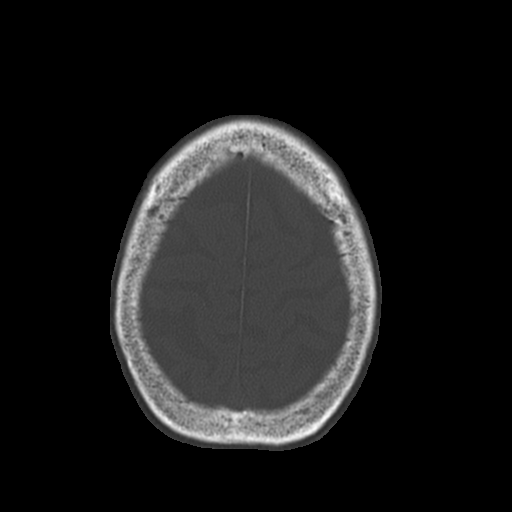
[im 48/54  bone]
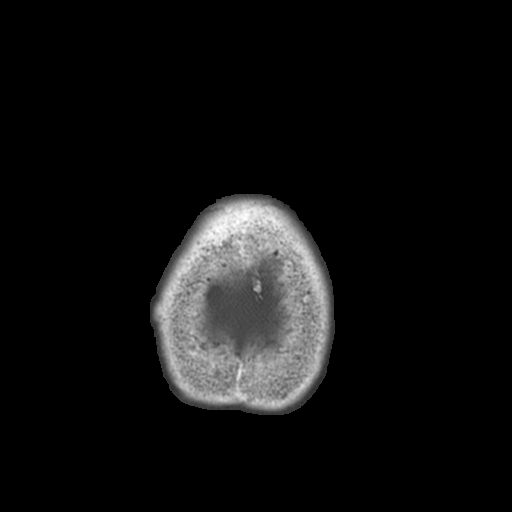

[17 of 30 positions shown; findings below may reference images not displayed]

FINDINGS: Generalized atrophy.

Normal ventricular morphology.

No midline shift or mass effect.

Small vessel chronic ischemic changes of deep cerebral white matter.

No intracranial hemorrhage, mass lesion, or acute infarction.

Visualized paranasal sinuses and mastoid air cells clear.

Bones demineralized.

Atherosclerotic calcification of internal carotid and vertebral
arteries at skullbase.
IMPRESSION: Atrophy with small vessel chronic ischemic changes of deep cerebral
white matter.

No acute intracranial abnormalities.

## 2016-12-18 IMAGING — CR DG CHEST 1V
1 series · 1 of 1 positions shown · non-contrast
Comparison: 10/15/2014 radiographs.

CLINICAL DATA: Right hip pain after falling today. History of
pelvic fractures. Initial encounter.

EXAM:
CHEST 1 VIEW

[x chest ap]
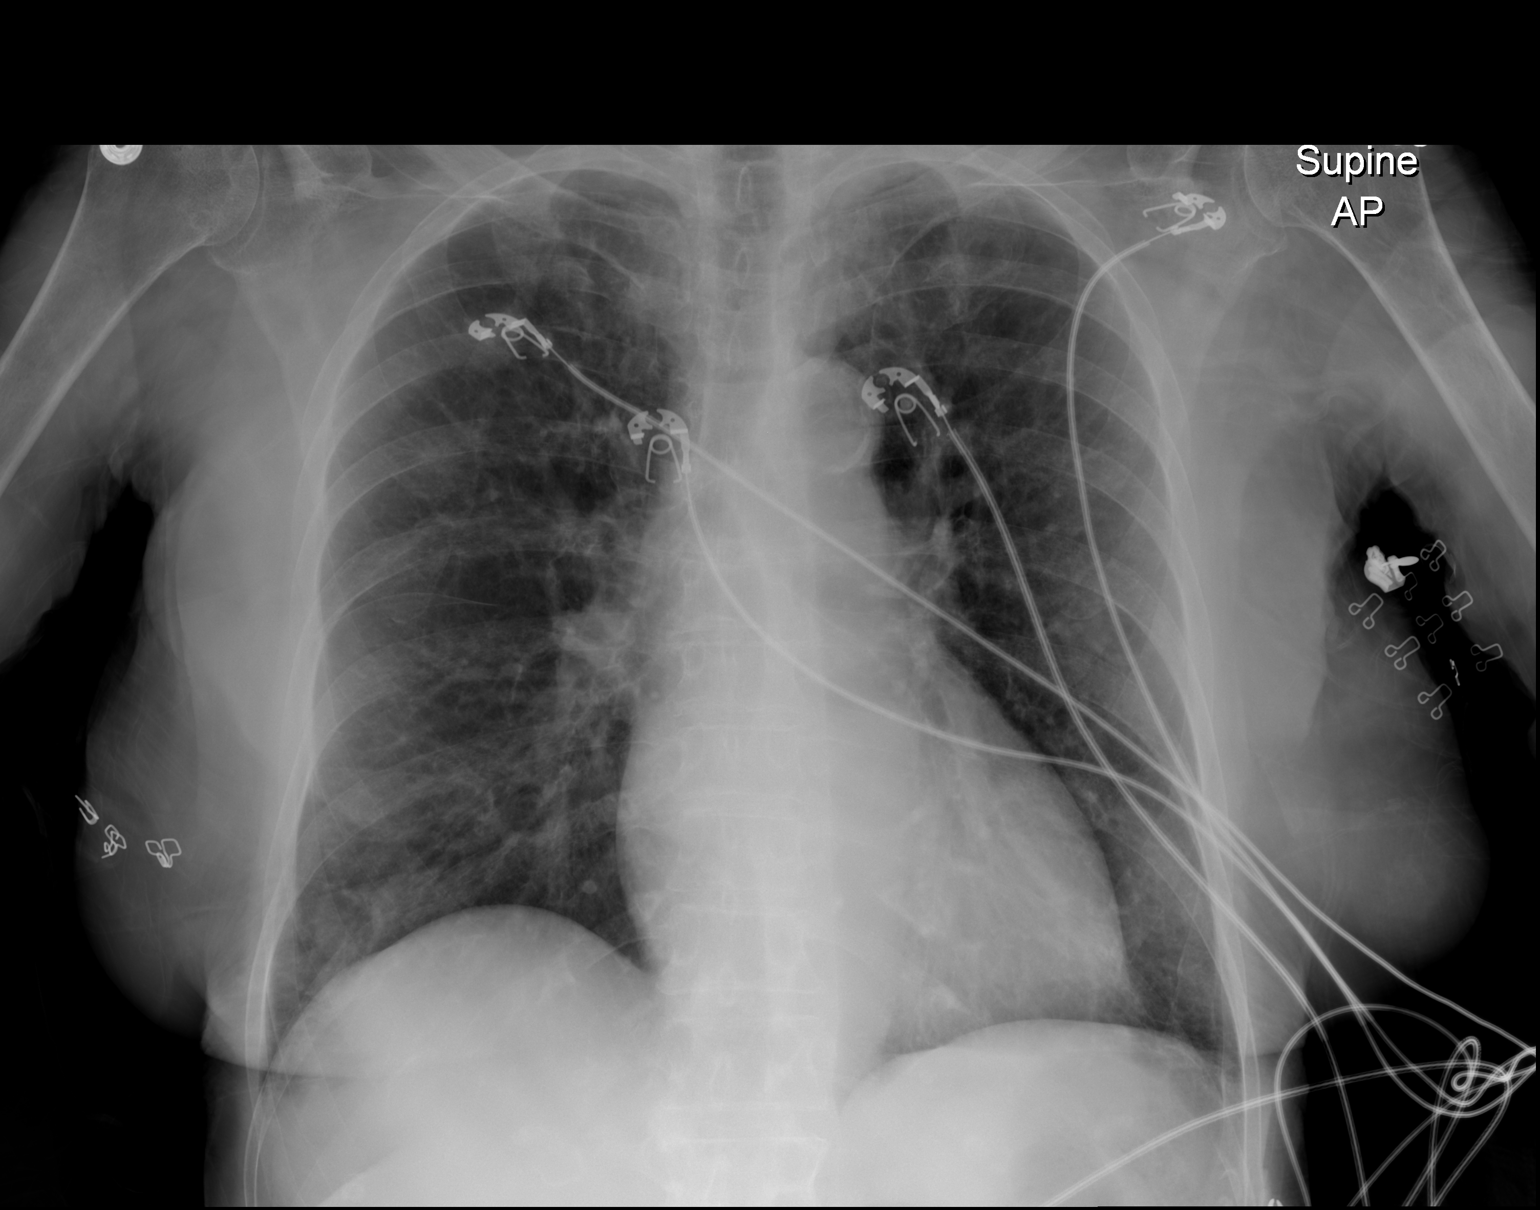

[1 of 1 positions shown; findings below may reference images not displayed]

FINDINGS: 3237 hours. The heart size and mediastinal contours are stable.
There is aortic atherosclerosis and mild chronic vascular
congestion. The lungs are otherwise clear. No pleural effusion,
pneumothorax or acute osseous abnormality identified.
IMPRESSION: No acute posttraumatic findings or active cardiopulmonary process
identified.

## 2016-12-21 ENCOUNTER — Non-Acute Institutional Stay: Payer: Medicare Other | Admitting: Nurse Practitioner

## 2016-12-21 ENCOUNTER — Other Ambulatory Visit: Payer: Self-pay | Admitting: *Deleted

## 2016-12-21 ENCOUNTER — Encounter: Payer: Self-pay | Admitting: Nurse Practitioner

## 2016-12-21 DIAGNOSIS — D509 Iron deficiency anemia, unspecified: Secondary | ICD-10-CM | POA: Insufficient documentation

## 2016-12-21 DIAGNOSIS — R269 Unspecified abnormalities of gait and mobility: Secondary | ICD-10-CM

## 2016-12-21 DIAGNOSIS — R42 Dizziness and giddiness: Secondary | ICD-10-CM

## 2016-12-21 DIAGNOSIS — I480 Paroxysmal atrial fibrillation: Secondary | ICD-10-CM | POA: Diagnosis not present

## 2016-12-21 DIAGNOSIS — K5900 Constipation, unspecified: Secondary | ICD-10-CM | POA: Diagnosis not present

## 2016-12-21 DIAGNOSIS — I10 Essential (primary) hypertension: Secondary | ICD-10-CM | POA: Diagnosis not present

## 2016-12-21 DIAGNOSIS — D649 Anemia, unspecified: Secondary | ICD-10-CM

## 2016-12-21 DIAGNOSIS — R413 Other amnesia: Secondary | ICD-10-CM | POA: Diagnosis not present

## 2016-12-21 NOTE — Progress Notes (Signed)
Location:  [facility] Room Number: 822 Place of Service:  ALF [phone]) Provider:  Aretha Levi, Manxie  NP  Murray Hodgkins, MD  Patient Care Team: Kimber Relic, MD as PCP - General (Internal Medicine) Venita Lick, MD as Consulting Physician (Orthopedic Surgery) Marykay Lex, MD as Consulting Physician (Cardiology) Daemien Fronczak Johnney Ou, NP as Nurse Practitioner (Internal Medicine)  Extended Emergency Contact Information Primary Emergency Contact: [name],Bibiana CT [place] Home Phone: [phone] Mobile Phone: [phone] Relation: Niece Secondary Emergency Contact: [name],[name]  [place] Mobile Phone: [phone] Relation: Friend  Code Status:  DNR  Goals of care: Advanced Directive information Advanced Directives 12/21/2016  Does Patient Have a Medical Advance Directive? Yes  Type of Advance Directive Out of facility DNR (pink MOST or yellow form)  Does patient want to make changes to medical advance directive? No - Patient declined  Copy of Healthcare Power of Attorney in Chart? -  Would patient like information on creating a medical advance directive? -  Pre-existing out of facility DNR order (yellow form or pink MOST form) Yellow form placed in chart (order not valid for inpatient use)     Chief Complaint  Patient presents with  . Medical Management of Chronic Issues    HPI:  Pt is a 83 y.o. female seen today for medical management of chronic diseases.    C/o persisted dizziness, vertigo, underwent Neurology eval 2016, MRI brain, prn Antivert, vertigo exercise. C/o worsened gait, using cane all the time now, no focal neurological deficits noted. Also c/o memory lapses, last MMSE 30/30.    Hx of hyponatremia, Na 137 09/13/16, blood pressure is controlled on Amlodipine 5mg  daily, takes Fe daily, last Hgb 12.9 08/23/16. Afib, heart rate is in control, on ASA and statin.  Past Medical History:  Diagnosis Date  . Arteriosclerotic  cardiovascular disease   . Bee sting reaction 08/23/2016   Tongue swelling and difficulty swallowing /notes 08/23/2016  . Constipation   . Degenerative joint disease of right hip 10/15/14  . Dysrhythmia    Paroxysmal Atrial Fibrillation  . Fracture of multiple pubic rami (HCC) 10/15/14   Right inferior and superior pubic rami  . HOH (hard of hearing)    Wears bilateral hearing aids  . Hyperlipidemia   . Hypertension   . Macular degeneration   . Memory deficit   . Osteoporosis   . Pain in right foot   . Palpitations   . Paroxysmal atrial fibrillation (HCC)   . Thyroid nodule    Status post surgery  . Ulcer of hard palate   . Vertigo    Past Surgical History:  Procedure Laterality Date  . ABDOMINAL HYSTERECTOMY  1968  . APPENDECTOMY  1940's  . COLONOSCOPY    . FOOT SURGERY Left   . HARDWARE REMOVAL Right 02/16/2016   Procedure: HARDWARE REMOVAL RT HIP;  Surgeon: Sheral Apley, MD;  Location: [facility] OR;  Service: Orthopedics;  Laterality: Right;  . HIP ARTHROPLASTY Right 02/16/2016   Procedure: ARTHROPLASTY BIPOLAR HIP (HEMIARTHROPLASTY);  Surgeon: Sheral Apley, MD;  Location: [facility] OR;  Service: Orthopedics;  Laterality: Right;  . INTRAMEDULLARY (IM) NAIL INTERTROCHANTERIC Right 09/11/2015   Procedure: INTRAMEDULLARY (IM) NAIL INTERTROCHANTRIC;  Surgeon: Sheral Apley, MD;  Location: [facility] OR;  Service: Orthopedics;  Laterality: Right;  . KNEE SURGERY      Allergies  Allergen Reactions  . Benadryl [Diphenhydramine] Other (See Comments)    Muscular degeneration  . Penicillins Other (See  Comments)    Has patient had a PCN reaction causing immediate rash, facial/tongue/throat swelling, SOB or lightheadedness with hypotension: Unknown Has patient had a PCN reaction causing severe rash involving mucus membranes or skin necrosis: Unknown Has patient had a PCN reaction that required hospitalization: Unknown Has patient had a PCN reaction occurring within the last 10 years: No If  all of the above answers are "NO", then may proceed with Cephalosporin use.     Allergies as of 12/21/2016      Reactions   Benadryl [diphenhydramine] Other (See Comments)   Muscular degeneration   Penicillins Other (See Comments)   Has patient had a PCN reaction causing immediate rash, facial/tongue/throat swelling, SOB or lightheadedness with hypotension: Unknown Has patient had a PCN reaction causing severe rash involving mucus membranes or skin necrosis: Unknown Has patient had a PCN reaction that required hospitalization: Unknown Has patient had a PCN reaction occurring within the last 10 years: No If all of the above answers are "NO", then may proceed with Cephalosporin use.      Medication List       Accurate as of 12/21/16  1:04 PM. Always use your most recent med list.          acetaminophen 500 MG tablet Commonly known as:  TYLENOL Take 500 mg by mouth every 6 (six) hours as needed (for pain, headaches, or fever).   amLODipine 5 MG tablet Commonly known as:  NORVASC Take 5 mg by mouth daily.   aspirin 325 MG tablet Take 325 mg by mouth daily.   atorvastatin 20 MG tablet Commonly known as:  LIPITOR Take 20 mg by mouth daily.   Biotin 10 MG Caps Take 10 mg by mouth daily.   docusate sodium 100 MG capsule Commonly known as:  COLACE Take 100 mg by mouth daily.   EPINEPHrine 0.3 mg/0.3 mL Soaj injection Commonly known as:  EPI-PEN Inject 0.3 mLs (0.3 mg total) into the muscle once as needed (anaphylaxis/allergic reaction).   ferrous gluconate 324 MG tablet Commonly known as:  FERGON Take 1 tablet (324 mg total) by mouth daily with breakfast.   lactose free nutrition Liqd Take 237 mLs by mouth daily with breakfast.   meclizine 12.5 MG tablet Commonly known as:  ANTIVERT Take 1 tablet (12.5 mg total) by mouth 3 (three) times daily as needed for dizziness.   Melatonin 3 MG Tabs Take 3 mg by mouth at bedtime as needed (for sleep).   multivitamin with  minerals Tabs tablet Take 1 tablet by mouth daily.   polyethylene glycol packet Commonly known as:  MIRALAX / GLYCOLAX Take 17 g by mouth daily as needed (for constipation). MIX IN 4 OUNCES OF FLUID   PRESERVISION AREDS 2 PO Take 2 capsules by mouth daily.   traMADol 50 MG tablet Commonly known as:  ULTRAM Take 50 mg by mouth every 6 (six) hours as needed for moderate pain.       Review of Systems  Constitutional: Negative for chills, diaphoresis and fever.  HENT: Positive for hearing loss. Negative for congestion, ear discharge, ear pain, nosebleeds and sore throat.        R ear, 90% hearing loss. Left hearing aid. Mouth sore from bee sting.   Eyes: Negative for photophobia and pain.       Macular degeneration R+L  Respiratory: Negative for cough, shortness of breath, wheezing and stridor.   Cardiovascular: Negative for chest pain, palpitations and leg swelling.  RLE  Gastrointestinal: Negative for abdominal pain, constipation, diarrhea, nausea and vomiting.  Genitourinary: Negative for dysuria, frequency and urgency.  Musculoskeletal: Positive for gait problem. Negative for back pain, myalgias and neck pain.       Right hip pain with weight bearing, improved.   Skin: Negative for rash.       Right buttock stage II pressure ulcer healed, mild erythema in the area. Right hip surgical incision healed.   Neurological: Positive for dizziness. Negative for tremors, seizures, weakness and headaches.  Hematological: Does not bruise/bleed easily.  Psychiatric/Behavioral: Positive for confusion. Negative for hallucinations and suicidal ideas. The patient is not nervous/anxious.        Mildly confusion of time and place.     Immunization History  Administered Date(s) Administered  . Influenza-Unspecified 09/07/2016  . PPD Test 02/18/2016  . Tdap 10/07/2012   Pertinent  Health Maintenance Due  Topic Date Due  . PNA vac Low Risk Adult (1 of 2 - PCV13) 04/15/1991  . INFLUENZA  VACCINE  Completed  . DEXA SCAN  Completed   Fall Risk  10/24/2014  Falls in the past year? Yes  Number falls in past yr: 1  Injury with Fall? Yes  Risk Factor Category  High Fall Risk  Risk for fall due to : History of fall(s)   Functional Status Survey:    Vitals:   12/21/16 0940  BP: 120/66  Pulse: 60  Resp: 20  Temp: 98.6 F (37 C)  Weight: 122 lb (55.3 kg)  Height: 5\' 3"  (1.6 m)   Body mass index is 21.61 kg/m. Physical Exam  Constitutional: She appears well-developed and well-nourished. No distress.  HENT:  Head: Normocephalic and atraumatic.  Right Ear: External ear normal.  Left Ear: External ear normal.  Nose: Nose normal.  Mouth/Throat: Oropharynx is clear and moist. No oropharyngeal exudate.   the sore area on the roof of mouth, noted erythematous area about a quarter size, the patient stated it has been about 2-3 days.   Eyes: Conjunctivae and EOM are normal. Pupils are equal, round, and reactive to light. Right eye exhibits no discharge. Left eye exhibits no discharge. No scleral icterus.  Macular degeneration R+L, Ophthalmology q 6 weeks. Right side tong sore from bee sting.   Neck: Normal range of motion. Neck supple. No JVD present. No tracheal deviation present. No thyromegaly present.  Cardiovascular: Normal rate, regular rhythm and intact distal pulses.  Exam reveals no gallop and no friction rub.   No murmur heard. Pulmonary/Chest: Effort normal and breath sounds normal. No stridor. No respiratory distress. She has no wheezes. She has no rales. She exhibits no tenderness.  Abdominal: Soft. Bowel sounds are normal. She exhibits no distension and no mass. There is no tenderness. There is no rebound and no guarding.  Musculoskeletal: She exhibits tenderness. She exhibits no edema.  Right hip ROM with pain and weight bearing, improved.   Lymphadenopathy:    She has no cervical adenopathy.  Neurological: She is alert. She displays normal reflexes. No cranial  nerve deficit. She exhibits normal muscle tone. Coordination normal.  Skin: Skin is warm and dry. No rash noted. She is not diaphoretic. No erythema. No pallor.  Right buttock stage II pressure ulcer healed, mild erythema in the area. Right hip surgical incisions healed.    Psychiatric: She has a normal mood and affect. Her behavior is normal. Judgment and thought content normal.    Labs reviewed:  Recent Labs  02/05/16 1333  07/04/16 1140 08/23/16 1539 08/24/16 0950 09/13/16  NA 137  < > 134* 132* 133* 137  K 4.1  < > 4.4 3.8 3.7 4.3  CL 102  --  101 96* 105  --   CO2 23  --  28  --  20*  --   GLUCOSE 87  --  78 146* 126*  --   BUN 15  < > 17 17 12 16   CREATININE 0.78  < > 0.71 0.80 0.83 0.7  CALCIUM 9.9  --  9.6  --  9.5  --   < > = values in this interval not displayed.  Recent Labs  02/05/16 1333 02/23/16 09/13/16  AST 30 32 24  ALT 26 35 20  ALKPHOS 90 95 57  BILITOT 0.7  --   --   PROT 7.2  --   --   ALBUMIN 4.2  --   --     Recent Labs  02/05/16 1333  07/04/16 1140 08/23/16 1454 08/23/16 1539 09/13/16  WBC 7.1  < > 5.7 9.3  --  4.8  NEUTROABS 5.0  --   --  5.0  --   --   HGB 12.7  < > 12.2 13.0 12.9 11.6*  HCT 37.7  < > 36.3 38.1 38.0 34*  MCV 89.5  --  91.7 92.0  --   --   PLT 300  < > 292 340  --  302  < > = values in this interval not displayed. Lab Results  Component Value Date   TSH 0.92 09/13/2016   No results found for: HGBA1C Lab Results  Component Value Date   CHOL 139 09/13/2016   HDL 68 09/13/2016   LDLCALC 60 09/13/2016   TRIG 53 09/13/2016    Significant Diagnostic Results in last 30 days:  No results found.  Assessment/Plan Memory deficit  AL for care assistance. Worse, c/o gait issue, dizziness, update CT head w/o CM, CBC, CMP, MMSE     Dizziness Persists, 10/13/15 Neurology, Dr. Naomie Dean, MRI, continue with the vertigo exercise, prn Meclizine.  Gait abnormality Wobbly, using cane more than prior. Update CT head.     Essential hypertension Controlled, continue Amlodipine 5mg  daily. 09/13/16 Na 137, K 4.3, Bun 16, creat 0.69   PAF (paroxysmal atrial fibrillation) (HCC) Heart rate is in control, continue Amlodipine 5mg , ASA, Statin.    Constipation Stable, continue Colace daily, MiraLax prn.    Anemia 09/13/17 Hgb 11.6, taking Fe     Family/ staff Communication: AL  Labs/tests ordered:  CT head w/o CM, CBC, CMP, MMSE

## 2016-12-21 NOTE — Assessment & Plan Note (Signed)
Heart rate is in control, continue Amlodipine 5mg, ASA, Statin.   

## 2016-12-21 NOTE — Assessment & Plan Note (Signed)
Controlled, continue Amlodipine 5mg daily. 09/13/16 Na 137, K 4.3, Bun 16, creat 0.69  

## 2016-12-21 NOTE — Assessment & Plan Note (Signed)
Stable, continue Colace daily, MiraLax prn.   

## 2016-12-21 NOTE — Assessment & Plan Note (Signed)
Wobbly, using cane more than prior. Update CT head.

## 2016-12-21 NOTE — Assessment & Plan Note (Addendum)
Persists, 10/13/15 Neurology, Dr. Naomie DeanAntonia Ahern, MRI, continue with the vertigo exercise, prn Meclizine.

## 2016-12-21 NOTE — Assessment & Plan Note (Signed)
AL for care assistance. Worse, c/o gait issue, dizziness, update CT head w/o CM, CBC, CMP, MMSE

## 2016-12-21 NOTE — Assessment & Plan Note (Addendum)
09/13/17 Hgb 11.6, taking Fe

## 2016-12-22 LAB — CBC AND DIFFERENTIAL
HEMATOCRIT: 34 % — AB (ref 36–46)
Hemoglobin: 11.6 g/dL — AB (ref 12.0–16.0)
Platelets: 254 10*3/uL (ref 150–399)
WBC: 3.9 10^3/mL

## 2016-12-22 LAB — HEPATIC FUNCTION PANEL
ALT: 15 U/L (ref 7–35)
AST: 22 U/L (ref 13–35)
Alkaline Phosphatase: 59 U/L (ref 25–125)
BILIRUBIN, TOTAL: 0.6 mg/dL

## 2016-12-22 LAB — BASIC METABOLIC PANEL
BUN: 17 mg/dL (ref 4–21)
CREATININE: 0.8 mg/dL (ref <=1.1)
Glucose: 82 mg/dL
Potassium: 4.5 mmol/L (ref 3.4–5.3)
Sodium: 138 mmol/L (ref 137–147)

## 2016-12-23 ENCOUNTER — Ambulatory Visit
Admission: RE | Admit: 2016-12-23 | Discharge: 2016-12-23 | Disposition: A | Discharge status: Home / Self Care | Payer: Medicare Other | Source: Ambulatory Visit | Attending: Nurse Practitioner | Admitting: Nurse Practitioner

## 2016-12-23 DIAGNOSIS — R269 Unspecified abnormalities of gait and mobility: Secondary | ICD-10-CM

## 2016-12-23 DIAGNOSIS — R413 Other amnesia: Secondary | ICD-10-CM

## 2016-12-23 DIAGNOSIS — R42 Dizziness and giddiness: Secondary | ICD-10-CM

## 2016-12-23 MED ORDER — IOPAMIDOL (ISOVUE-300) INJECTION 61%
75.0000 mL | Freq: Once | INTRAVENOUS | Status: AC | PRN
  Administered 2016-12-23: 75 mL via INTRAVENOUS

## 2016-12-29 ENCOUNTER — Other Ambulatory Visit: Payer: Self-pay | Admitting: *Deleted

## 2017-01-18 ENCOUNTER — Non-Acute Institutional Stay: Payer: Medicare Other | Admitting: Nurse Practitioner

## 2017-01-18 ENCOUNTER — Encounter: Payer: Self-pay | Admitting: Nurse Practitioner

## 2017-01-18 DIAGNOSIS — D649 Anemia, unspecified: Secondary | ICD-10-CM

## 2017-01-18 DIAGNOSIS — E559 Vitamin D deficiency, unspecified: Secondary | ICD-10-CM

## 2017-01-18 DIAGNOSIS — I10 Essential (primary) hypertension: Secondary | ICD-10-CM

## 2017-01-18 DIAGNOSIS — R200 Anesthesia of skin: Secondary | ICD-10-CM | POA: Insufficient documentation

## 2017-01-18 DIAGNOSIS — R252 Cramp and spasm: Secondary | ICD-10-CM | POA: Insufficient documentation

## 2017-01-18 DIAGNOSIS — K5900 Constipation, unspecified: Secondary | ICD-10-CM

## 2017-01-18 DIAGNOSIS — R413 Other amnesia: Secondary | ICD-10-CM

## 2017-01-18 DIAGNOSIS — R42 Dizziness and giddiness: Secondary | ICD-10-CM

## 2017-01-18 DIAGNOSIS — I480 Paroxysmal atrial fibrillation: Secondary | ICD-10-CM

## 2017-01-18 NOTE — Assessment & Plan Note (Signed)
12/23/16 MMSE 28/30

## 2017-01-18 NOTE — Assessment & Plan Note (Signed)
Heart rate is in control, continue Amlodipine 5mg, ASA, Statin.   

## 2017-01-18 NOTE — Assessment & Plan Note (Addendum)
Controlled, continue Amlodipine 5mg  daily. 12/22/16 wbc 3.9, Hgb 12.2, plt 254, Na 138, K 4.5, Bun 17, creat 0.76, TP 6.1, albumin 3.4

## 2017-01-18 NOTE — Assessment & Plan Note (Signed)
Check Vit D level, may consider prn Clonazepam.

## 2017-01-18 NOTE — Assessment & Plan Note (Signed)
Last Hgb 12.2 12/22/16

## 2017-01-18 NOTE — Progress Notes (Signed)
Location:  [facility] Room Number: 822 Place of Service:  ALF [phone]) Provider:  Ryson Bacha, Manxie  NP  Murray Hodgkins, MD  Patient Care Team: Kimber Relic, MD as PCP - General (Internal Medicine) Venita Lick, MD as Consulting Physician (Orthopedic Surgery) Marykay Lex, MD as Consulting Physician (Cardiology) Kathrynne Kulinski Johnney Ou, NP as Nurse Practitioner (Internal Medicine)  Extended Emergency Contact Information Primary Emergency Contact: [name],Shanyce CT [place] Home Phone: [phone] Mobile Phone: [phone] Relation: Niece Secondary Emergency Contact: [name],[name]  [place] Mobile Phone: [phone] Relation: Friend  Code Status:  DNR Goals of care: Advanced Directive information Advanced Directives 01/18/2017  Does Patient Have a Medical Advance Directive? Yes  Type of Advance Directive Out of facility DNR (pink MOST or yellow form)  Does patient want to make changes to medical advance directive? No - Patient declined  Copy of Healthcare Power of Attorney in Chart? -  Would patient like information on creating a medical advance directive? -  Pre-existing out of facility DNR order (yellow form or pink MOST form) Yellow form placed in chart (order not valid for inpatient use)     Chief Complaint  Patient presents with  . Acute Visit    hands cramping    HPI:  Pt is a 83 y.o. female seen today for an acute visit for the left 2nd, 3rd, 4th finger numbness in am, better after getting up and moving around, denied pain or focal weakness.     Persisted dizziness, vertigo, underwent Neurology eval 2016, MRI brain, prn Antivert, vertigo exercise. C/o worsened gait, using cane all the time now, no focal neurological deficits noted. Also c/o memory lapses, last MMSE 30/30. 12/23/16 CT head w/wo CM: no acute or traumatic finding, atrophy and chronic small vessel ischemic changes, atherosclerotic calcification of the major vessels at  the base of brain.  Continue prn Antivert.               Hx of hyponatremia, Na 137 09/13/16, blood pressure is controlled on Amlodipine 5mg  daily, takes Fe daily, last Hgb 12.9 08/23/16. Afib, heart rate is in control, on ASA and statin.   Past Medical History:  Diagnosis Date  . Arteriosclerotic cardiovascular disease   . Bee sting reaction 08/23/2016   Tongue swelling and difficulty swallowing /notes 08/23/2016  . Constipation   . Degenerative joint disease of right hip 10/15/14  . Dysrhythmia    Paroxysmal Atrial Fibrillation  . Fracture of multiple pubic rami (HCC) 10/15/14   Right inferior and superior pubic rami  . HOH (hard of hearing)    Wears bilateral hearing aids  . Hyperlipidemia   . Hypertension   . Macular degeneration   . Memory deficit   . Osteoporosis   . Pain in right foot   . Palpitations   . Paroxysmal atrial fibrillation (HCC)   . Thyroid nodule    Status post surgery  . Ulcer of hard palate   . Vertigo    Past Surgical History:  Procedure Laterality Date  . ABDOMINAL HYSTERECTOMY  1968  . APPENDECTOMY  1940's  . COLONOSCOPY    . FOOT SURGERY Left   . HARDWARE REMOVAL Right 02/16/2016   Procedure: HARDWARE REMOVAL RT HIP;  Surgeon: Sheral Apley, MD;  Location: [facility] OR;  Service: Orthopedics;  Laterality: Right;  . HIP ARTHROPLASTY Right 02/16/2016   Procedure: ARTHROPLASTY BIPOLAR HIP (HEMIARTHROPLASTY);  Surgeon: Sheral Apley, MD;  Location: [facility] OR;  Service:  Orthopedics;  Laterality: Right;  . INTRAMEDULLARY (IM) NAIL INTERTROCHANTERIC Right 09/11/2015   Procedure: INTRAMEDULLARY (IM) NAIL INTERTROCHANTRIC;  Surgeon: Sheral Apley, MD;  Location: [facility] OR;  Service: Orthopedics;  Laterality: Right;  . KNEE SURGERY      Allergies  Allergen Reactions  . Benadryl [Diphenhydramine] Other (See Comments)    Muscular degeneration  . Penicillins Other (See Comments)    Has patient had a PCN reaction causing immediate rash, facial/tongue/throat  swelling, SOB or lightheadedness with hypotension: Unknown Has patient had a PCN reaction causing severe rash involving mucus membranes or skin necrosis: Unknown Has patient had a PCN reaction that required hospitalization: Unknown Has patient had a PCN reaction occurring within the last 10 years: No If all of the above answers are "NO", then may proceed with Cephalosporin use.     Allergies as of 01/18/2017      Reactions   Benadryl [diphenhydramine] Other (See Comments)   Muscular degeneration   Penicillins Other (See Comments)   Has patient had a PCN reaction causing immediate rash, facial/tongue/throat swelling, SOB or lightheadedness with hypotension: Unknown Has patient had a PCN reaction causing severe rash involving mucus membranes or skin necrosis: Unknown Has patient had a PCN reaction that required hospitalization: Unknown Has patient had a PCN reaction occurring within the last 10 years: No If all of the above answers are "NO", then may proceed with Cephalosporin use.      Medication List       Accurate as of 01/18/17  4:06 PM. Always use your most recent med list.          acetaminophen 500 MG tablet Commonly known as:  TYLENOL Take 500 mg by mouth every 6 (six) hours as needed (for pain, headaches, or fever).   amLODipine 5 MG tablet Commonly known as:  NORVASC Take 5 mg by mouth daily.   aspirin 325 MG tablet Take 325 mg by mouth daily.   atorvastatin 20 MG tablet Commonly known as:  LIPITOR Take 20 mg by mouth daily.   Biotin 10 MG Caps Take 10 mg by mouth daily.   docusate sodium 100 MG capsule Commonly known as:  COLACE Take 100 mg by mouth daily.   EPINEPHrine 0.3 mg/0.3 mL Soaj injection Commonly known as:  EPI-PEN Inject 0.3 mLs (0.3 mg total) into the muscle once as needed (anaphylaxis/allergic reaction).   ferrous gluconate 324 MG tablet Commonly known as:  FERGON Take 1 tablet (324 mg total) by mouth daily with breakfast.   meclizine  12.5 MG tablet Commonly known as:  ANTIVERT Take 1 tablet (12.5 mg total) by mouth 3 (three) times daily as needed for dizziness.   Melatonin 3 MG Tabs Take 3 mg by mouth at bedtime as needed (for sleep).   multivitamin with minerals Tabs tablet Take 1 tablet by mouth daily.   polyethylene glycol packet Commonly known as:  MIRALAX / GLYCOLAX Take 17 g by mouth daily as needed (for constipation). MIX IN 4 OUNCES OF FLUID   PRESERVISION AREDS 2 PO Take 2 capsules by mouth daily.       Review of Systems  Constitutional: Negative for chills, diaphoresis and fever.  HENT: Positive for hearing loss. Negative for congestion, ear discharge, ear pain, nosebleeds and sore throat.        R ear, 90% hearing loss. Left hearing aid. Mouth sore from bee sting.   Eyes: Negative for photophobia and pain.       Macular degeneration R+L  Respiratory: Negative for cough, shortness of breath, wheezing and stridor.   Cardiovascular: Negative for chest pain, palpitations and leg swelling.       RLE  Gastrointestinal: Negative for abdominal pain, constipation, diarrhea, nausea and vomiting.  Genitourinary: Negative for dysuria, frequency and urgency.  Musculoskeletal: Positive for gait problem. Negative for back pain, myalgias and neck pain.       Right hip pain with weight bearing, improved.   Skin: Negative for rash.       Right buttock stage II pressure ulcer healed, mild erythema in the area. Right hip surgical incision healed.   Neurological: Positive for dizziness and numbness. Negative for tremors, seizures, weakness and headaches.       The left 2nd, 3rd, 4th finger numbness in am  Hematological: Does not bruise/bleed easily.  Psychiatric/Behavioral: Positive for confusion. Negative for hallucinations and suicidal ideas. The patient is not nervous/anxious.        Mildly confusion of time and place.     Immunization History  Administered Date(s) Administered  . Influenza-Unspecified  09/07/2016  . PPD Test 02/18/2016  . Tdap 10/07/2012   Pertinent  Health Maintenance Due  Topic Date Due  . PNA vac Low Risk Adult (1 of 2 - PCV13) 04/15/1991  . INFLUENZA VACCINE  Completed  . DEXA SCAN  Completed   Fall Risk  10/24/2014  Falls in the past year? Yes  Number falls in past yr: 1  Injury with Fall? Yes  Risk Factor Category  High Fall Risk  Risk for fall due to : History of fall(s)   Functional Status Survey:    Vitals:   01/18/17 1205  BP: 120/60  Pulse: 66  Resp: 18  Temp: 97.8 F (36.6 C)  Weight: 122 lb (55.3 kg)  Height: 5\' 3"  (1.6 m)   Body mass index is 21.61 kg/m. Physical Exam  Constitutional: She appears well-developed and well-nourished. No distress.  HENT:  Head: Normocephalic and atraumatic.  Right Ear: External ear normal.  Left Ear: External ear normal.  Nose: Nose normal.  Mouth/Throat: Oropharynx is clear and moist. No oropharyngeal exudate.   the sore area on the roof of mouth, noted erythematous area about a quarter size, the patient stated it has been about 2-3 days.   Eyes: Conjunctivae and EOM are normal. Pupils are equal, round, and reactive to light. Right eye exhibits no discharge. Left eye exhibits no discharge. No scleral icterus.  Macular degeneration R+L, Ophthalmology q 6 weeks. Right side tong sore from bee sting.   Neck: Normal range of motion. Neck supple. No JVD present. No tracheal deviation present. No thyromegaly present.  Cardiovascular: Normal rate, regular rhythm and intact distal pulses.  Exam reveals no gallop and no friction rub.   No murmur heard. Pulmonary/Chest: Effort normal and breath sounds normal. No stridor. No respiratory distress. She has no wheezes. She has no rales. She exhibits no tenderness.  Abdominal: Soft. Bowel sounds are normal. She exhibits no distension and no mass. There is no tenderness. There is no rebound and no guarding.  Musculoskeletal: She exhibits tenderness. She exhibits no edema.    Right hip ROM with pain and weight bearing, improved.   Lymphadenopathy:    She has no cervical adenopathy.  Neurological: She is alert. She displays normal reflexes. No cranial nerve deficit. She exhibits normal muscle tone. Coordination normal.  Skin: Skin is warm and dry. No rash noted. She is not diaphoretic. No erythema. No pallor.  Right buttock stage II  pressure ulcer healed, mild erythema in the area. Right hip surgical incisions healed.    Psychiatric: She has a normal mood and affect. Her behavior is normal. Judgment and thought content normal.    Labs reviewed:  Recent Labs  02/05/16 1333  07/04/16 1140 08/23/16 1539 08/24/16 0950 09/13/16 12/22/16  NA 137  < > 134* 132* 133* 137 138  K 4.1  < > 4.4 3.8 3.7 4.3 4.5  CL 102  --  101 96* 105  --   --   CO2 23  --  28  --  20*  --   --   GLUCOSE 87  --  78 146* 126*  --   --   BUN 15  < > 17 17 12 16 17   CREATININE 0.78  < > 0.71 0.80 0.83 0.7 0.8  CALCIUM 9.9  --  9.6  --  9.5  --   --   < > = values in this interval not displayed.  Recent Labs  02/05/16 1333 02/23/16 09/13/16 12/22/16  AST 30 32 24 22  ALT 26 35 20 15  ALKPHOS 90 95 57 59  BILITOT 0.7  --   --   --   PROT 7.2  --   --   --   ALBUMIN 4.2  --   --   --     Recent Labs  02/05/16 1333  07/04/16 1140 08/23/16 1454 08/23/16 1539 09/13/16 12/22/16  WBC 7.1  < > 5.7 9.3  --  4.8 3.9  NEUTROABS 5.0  --   --  5.0  --   --   --   HGB 12.7  < > 12.2 13.0 12.9 11.6* 11.6*  HCT 37.7  < > 36.3 38.1 38.0 34* 34*  MCV 89.5  --  91.7 92.0  --   --   --   PLT 300  < > 292 340  --  302 254  < > = values in this interval not displayed. Lab Results  Component Value Date   TSH 0.92 09/13/2016   No results found for: HGBA1C Lab Results  Component Value Date   CHOL 139 09/13/2016   HDL 68 09/13/2016   LDLCALC 60 09/13/2016   TRIG 53 09/13/2016    Significant Diagnostic Results in last 30 days:  Ct Head W & Wo Contrast  Result Date:  12/23/2016 CLINICAL DATA:  Vertigo. Unsteady gait over the last 4 months. Head struck chair 2 days ago. Creatinine was obtained on site at [facility] at [address]Results: Creatinine 0.8 mg/dL. EXAM: CT HEAD WITHOUT AND WITH CONTRAST TECHNIQUE: Contiguous axial images were obtained from the base of the skull through the vertex without and with intravenous contrast CONTRAST:  75mL ISOVUE-300 IOPAMIDOL (ISOVUE-300) INJECTION 61% COMPARISON:  MRI 11/03/2015 FINDINGS: Brain: The brain shows generalized atrophy. There chronic small-vessel ischemic changes of the white matter and pons. No sign of acute infarction, mass lesion, hemorrhage, hydrocephalus or extra-axial collection. No abnormal contrast enhancement occurs. Vascular: Major vessels at the base of the brain show flow. There is atherosclerotic calcification of the major vessels at the base of the brain. Skull: Negative Sinuses/Orbits: Clear/normal Other: None IMPRESSION: No acute or traumatic finding. Atrophy and chronic small-vessel ischemic changes. Atherosclerotic calcification of the major vessels at the base the brain. Electronically Signed   By: Paulina Fusi M.D.   On: 12/23/2016 11:09    Assessment/Plan Essential hypertension Controlled, continue Amlodipine 5mg  daily. 12/22/16 wbc 3.9,  Hgb 12.2, plt 254, Na 138, K 4.5, Bun 17, creat 0.76, TP 6.1, albumin 3.4   PAF (paroxysmal atrial fibrillation) (HCC) Heart rate is in control, continue Amlodipine 5mg , ASA, Statin.     Constipation Stable, continue Colace daily, MiraLax prn.    Memory deficit 12/23/16 MMSE 28/30  Vitamin D deficiency Vit D level.   Dizziness 12/23/16 CT head w/wo CM: no acute or traumatic finding, atrophy and chronic small vessel ischemic changes, atherosclerotic calcification of the major vessels at the base of brain.  Continue prn Antivert.   Anemia Last Hgb 12.2 12/22/16  Cramping of hands Check Vit D level, may consider prn Clonazepam.    Numbness of fingers Left 2nd, 3rd, 4th PT to evla and tx May consider Cervical spine X-ray, neurology consult if persists.      Family/ staff Communication: AL, PT to eval and tx  Labs/tests ordered:  Vit D level.

## 2017-01-18 NOTE — Assessment & Plan Note (Signed)
Vit D level.

## 2017-01-18 NOTE — Assessment & Plan Note (Signed)
Left 2nd, 3rd, 4th PT to evla and tx May consider Cervical spine X-ray, neurology consult if persists.

## 2017-01-18 NOTE — Assessment & Plan Note (Signed)
12/23/16 CT head w/wo CM: no acute or traumatic finding, atrophy and chronic small vessel ischemic changes, atherosclerotic calcification of the major vessels at the base of brain.  Continue prn Antivert.

## 2017-01-18 NOTE — Assessment & Plan Note (Signed)
Stable, continue Colace daily, MiraLax prn.   

## 2017-01-19 ENCOUNTER — Non-Acute Institutional Stay: Payer: Medicare Other | Admitting: Internal Medicine

## 2017-01-19 ENCOUNTER — Encounter: Payer: Self-pay | Admitting: Internal Medicine

## 2017-01-19 DIAGNOSIS — I480 Paroxysmal atrial fibrillation: Secondary | ICD-10-CM

## 2017-01-19 DIAGNOSIS — E559 Vitamin D deficiency, unspecified: Secondary | ICD-10-CM

## 2017-01-19 DIAGNOSIS — R269 Unspecified abnormalities of gait and mobility: Secondary | ICD-10-CM | POA: Diagnosis not present

## 2017-01-19 DIAGNOSIS — I10 Essential (primary) hypertension: Secondary | ICD-10-CM

## 2017-01-19 DIAGNOSIS — R413 Other amnesia: Secondary | ICD-10-CM | POA: Diagnosis not present

## 2017-01-19 DIAGNOSIS — E871 Hypo-osmolality and hyponatremia: Secondary | ICD-10-CM

## 2017-01-19 LAB — VITAMIN D 25 HYDROXY (VIT D DEFICIENCY, FRACTURES): VIT D 25 HYDROXY: 25

## 2017-01-19 MED ORDER — VITAMIN D 50 MCG (2000 UT) PO TABS: ORAL_TABLET | ORAL | 5 refills | Status: DC

## 2017-01-19 NOTE — Progress Notes (Signed)
History and Physical   Location: [facility] Room Number: [phone] Place of Service:  ALF [phone])   PCP: Murray Hodgkins, MD Patient Care Team: Kimber Relic, MD as PCP - General (Internal Medicine) Venita Lick, MD as Consulting Physician (Orthopedic Surgery) Marykay Lex, MD as Consulting Physician (Cardiology) Man Johnney Ou, NP as Nurse Practitioner (Internal Medicine)  Extended Emergency Contact Information Primary Emergency Contact: [name],Cassidy CT [place] Home Phone: [phone] Mobile Phone: [phone] Relation: Niece Secondary Emergency Contact: [name],[name]  [place] Mobile Phone: [phone] Relation: Friend  Code Status: DNR Goals of Care: Advanced Directive information Advanced Directives 01/19/2017  Does Patient Have a Medical Advance Directive? Yes  Type of Advance Directive Out of facility DNR (pink MOST or yellow form)  Does patient want to make changes to medical advance directive? -  Copy of Healthcare Power of Attorney in Chart? -  Would patient like information on creating a medical advance directive? -  Pre-existing out of facility DNR order (yellow form or pink MOST form) Yellow form placed in chart (order not valid for inpatient use)     Chief Complaint  Patient presents with  . Annual Exam    History and Physical    HPI: Patient is a 83 y.o. female seen today for an annual comprehensive examination. She was admitted to the [facility] on 09/26/16. She was in the SNF following hospitalization from 09/10/15 to 09/14/15 following right hip fracture. She was rehospitalized 02/16/16 to 02/18/16 for revision arthroplasty of the right hip due to severe pain.  She was admitted to the AL at a time she had increased gait instability and weakness. She was dizzy with postural changes.  She still feels dizzy from time to time.  Hyponatremia - mild with last Na+ normal at 138  Vitamin D deficiency - only 28 on  01/19/17. Will need treatment.  PAF (paroxysmal atrial fibrillation) (HCC) - currently seems to be in NSR.  Essential hypertension - mild elevation in the SBP  Gait abnormality - unstable  Memory deficit - mild. Remains conversational and capable of making decisions.    Past Medical History:  Diagnosis Date  . Arteriosclerotic cardiovascular disease   . Bee sting reaction 08/23/2016   Tongue swelling and difficulty swallowing /notes 08/23/2016  . Constipation   . Degenerative joint disease of right hip 10/15/14  . Dysrhythmia    Paroxysmal Atrial Fibrillation  . Fracture of multiple pubic rami (HCC) 10/15/14   Right inferior and superior pubic rami  . HOH (hard of hearing)    Wears bilateral hearing aids  . Hyperlipidemia   . Hypertension   . Macular degeneration   . Memory deficit   . Osteoporosis   . Pain in right foot   . Palpitations   . Paroxysmal atrial fibrillation (HCC)   . Thyroid nodule    Status post surgery  . Ulcer of hard palate   . Vertigo    Past Surgical History:  Procedure Laterality Date  . ABDOMINAL HYSTERECTOMY  1968  . APPENDECTOMY  1940's  . COLONOSCOPY    . FOOT SURGERY Left   . HARDWARE REMOVAL Right 02/16/2016   Procedure: HARDWARE REMOVAL RT HIP;  Surgeon: Sheral Apley, MD;  Location: [facility] OR;  Service: Orthopedics;  Laterality: Right;  . HIP ARTHROPLASTY Right 02/16/2016   Procedure: ARTHROPLASTY BIPOLAR HIP (HEMIARTHROPLASTY);  Surgeon: Sheral Apley, MD;  Location: [facility] OR;  Service: Orthopedics;  Laterality: Right;  .  INTRAMEDULLARY (IM) NAIL INTERTROCHANTERIC Right 09/11/2015   Procedure: INTRAMEDULLARY (IM) NAIL INTERTROCHANTRIC;  Surgeon: Sheral Apley, MD;  Location: [facility] OR;  Service: Orthopedics;  Laterality: Right;  . KNEE SURGERY      reports that she quit smoking about 45 years ago. Her smoking use included Cigarettes. She smoked 0.50 packs per day. She has never used smokeless tobacco. She reports that she drinks alcohol. She  reports that she does not use drugs. Social History   Social History  . Marital status: Single    Spouse name: N/A  . Number of children: 0  . Years of education: 16+   Occupational History  . Retired     Social History Main Topics  . Smoking status: Former Smoker    Packs/day: 0.50    Types: Cigarettes    Quit date: 10/05/1971  . Smokeless tobacco: Never Used  . Alcohol use Yes     Comment: 1-4 per week; eats 10 raisins daily soaked in Gin  . Drug use: No  . Sexual activity: No   Other Topics Concern  . None   Social History Narrative   L[facility] at [facility]. Moved to [place] 07/06/16   Never married   Caffeine use: 2 cups coffee per day   No tea/soda   Former smoker 1972   Alcohol 1-4 per week. Eats 10 raisins daily soaked in Gin   DNR      Family History  Problem Relation Age of Onset  . Heart disease Father   . Cancer Father     prostate  . Cancer Maternal Grandmother     colon  . Heart disease Paternal Grandmother   . Hyperlipidemia Sister   . Cancer Sister     breast  . Heart disease Brother   . Hyperlipidemia Brother   . Cancer Brother     prostate    Pertinent  Health Maintenance Due  Topic Date Due  . PNA vac Low Risk Adult (1 of 2 - PCV13) 04/15/1991  . INFLUENZA VACCINE  Completed  . DEXA SCAN  Completed   Fall Risk  10/24/2014  Falls in the past year? Yes  Number falls in past yr: 1  Injury with Fall? Yes  Risk Factor Category  High Fall Risk  Risk for fall due to : History of fall(s)   Depression screen [facility] 2/9 10/24/2014  Decreased Interest 0  Down, Depressed, Hopeless 0  PHQ - 2 Score 0     Allergies  Allergen Reactions  . Benadryl [Diphenhydramine] Other (See Comments)    Muscular degeneration  . Penicillins Other (See Comments)    Has patient had a PCN reaction causing immediate rash, facial/tongue/throat swelling, SOB or lightheadedness with hypotension: Unknown Has patient had a PCN reaction causing severe rash  involving mucus membranes or skin necrosis: Unknown Has patient had a PCN reaction that required hospitalization: Unknown Has patient had a PCN reaction occurring within the last 10 years: No If all of the above answers are "NO", then may proceed with Cephalosporin use.     Allergies as of 01/19/2017      Reactions   Benadryl [diphenhydramine] Other (See Comments)   Muscular degeneration   Penicillins Other (See Comments)   Has patient had a PCN reaction causing immediate rash, facial/tongue/throat swelling, SOB or lightheadedness with hypotension: Unknown Has patient had a PCN reaction causing severe rash involving mucus membranes or skin necrosis: Unknown Has patient had a PCN reaction that required hospitalization: Unknown  Has patient had a PCN reaction occurring within the last 10 years: No If all of the above answers are "NO", then may proceed with Cephalosporin use.      Medication List       Accurate as of 01/19/17 12:17 PM. Always use your most recent med list.          acetaminophen 500 MG tablet Commonly known as:  TYLENOL Take 500 mg by mouth every 6 (six) hours as needed (for pain, headaches, or fever).   amLODipine 5 MG tablet Commonly known as:  NORVASC Take 5 mg by mouth daily.   aspirin 325 MG tablet Take 325 mg by mouth daily.   atorvastatin 20 MG tablet Commonly known as:  LIPITOR Take 20 mg by mouth daily.   Biotin 10 MG Caps Take 10 mg by mouth daily.   docusate sodium 100 MG capsule Commonly known as:  COLACE Take 100 mg by mouth daily.   EPINEPHrine 0.3 mg/0.3 mL Soaj injection Commonly known as:  EPI-PEN Inject 0.3 mLs (0.3 mg total) into the muscle once as needed (anaphylaxis/allergic reaction).   ferrous gluconate 324 MG tablet Commonly known as:  FERGON Take 1 tablet (324 mg total) by mouth daily with breakfast.   meclizine 12.5 MG tablet Commonly known as:  ANTIVERT Take 1 tablet (12.5 mg total) by mouth 3 (three) times daily as needed  for dizziness.   Melatonin 3 MG Tabs Take 3 mg by mouth at bedtime as needed (for sleep).   multivitamin with minerals Tabs tablet Take 1 tablet by mouth daily.   polyethylene glycol packet Commonly known as:  MIRALAX / GLYCOLAX Take 17 g by mouth daily as needed (for constipation). MIX IN 4 OUNCES OF FLUID   PRESERVISION AREDS 2 PO Take 2 capsules by mouth daily.       Review of Systems  Constitutional: Negative for chills, diaphoresis and fever.       Frail  HENT: Positive for hearing loss. Negative for congestion, ear discharge, ear pain, nosebleeds and sore throat.        R ear, 90% hearing loss. Left hearing aid.  Eyes: Positive for visual disturbance (corrective lenses). Negative for photophobia and pain.       Macular degeneration R+L  Respiratory: Negative for cough, shortness of breath, wheezing and stridor.   Cardiovascular: Negative for chest pain, palpitations and leg swelling.       RLE  Gastrointestinal: Negative for abdominal pain, constipation, diarrhea, nausea and vomiting.  Genitourinary: Negative for dysuria, frequency and urgency.  Musculoskeletal: Positive for gait problem. Negative for back pain, myalgias and neck pain.       Right hip pain with weight bearing, improved.   Skin: Negative for rash.       Right buttock stage II pressure ulcer healed, mild erythema in the area. Right hip surgical incision healed.   Neurological: Positive for dizziness and numbness. Negative for tremors, seizures, weakness and headaches.       The left 2nd, 3rd, 4th finger numbness in am  Hematological: Does not bruise/bleed easily.  Psychiatric/Behavioral: Positive for confusion. Negative for hallucinations and suicidal ideas. The patient is not nervous/anxious.        Mildly confusion of time and place.     Vitals:   01/19/17 1214  BP: 120/60  Pulse: 66  Resp: 18  Temp: 97.8 F (36.6 C)  Weight: 120 lb (54.4 kg)  Height: 5' 1.5" (1.562 m)   Body mass index is  22.31 kg/m. Physical Exam  Constitutional: She is oriented to person, place, and time. She appears well-developed and well-nourished. No distress.  Thin. frail  HENT:  Head: Normocephalic and atraumatic.  Right Ear: External ear normal.  Left Ear: External ear normal.  Nose: Nose normal.  Mouth/Throat: Oropharynx is clear and moist. No oropharyngeal exudate.  Eyes: Conjunctivae and EOM are normal. Pupils are equal, round, and reactive to light. Right eye exhibits no discharge. Left eye exhibits no discharge. No scleral icterus.  Macular degeneration R+L, Ophthalmology q 6 weeks.  Neck: Normal range of motion. Neck supple. No JVD present. No tracheal deviation present. No thyromegaly present.  Cardiovascular: Normal rate, regular rhythm, normal heart sounds and intact distal pulses.  Exam reveals no gallop and no friction rub.   No murmur heard. Pulmonary/Chest: Effort normal and breath sounds normal. No stridor. No respiratory distress. She has no wheezes. She has no rales. She exhibits no tenderness.  Abdominal: Soft. Bowel sounds are normal. She exhibits no distension and no mass. There is no tenderness. There is no rebound and no guarding.  Musculoskeletal: Normal range of motion. She exhibits no edema or tenderness.  Unsteady  Lymphadenopathy:    She has no cervical adenopathy.  Neurological: She is alert and oriented to person, place, and time. She displays normal reflexes. No cranial nerve deficit. She exhibits normal muscle tone. Coordination normal.  Mild confusion and forgetfulness  Skin: Skin is warm and dry. No rash noted. She is not diaphoretic. No erythema. No pallor.  Psychiatric: She has a normal mood and affect. Her behavior is normal. Judgment and thought content normal.    Labs reviewed: Basic Metabolic Panel:  Recent Labs  54/09/81 1333  07/04/16 1140 08/23/16 1539 08/24/16 0950 09/13/16 12/22/16  NA 137  < > 134* 132* 133* 137 138  K 4.1  < > 4.4 3.8 3.7 4.3  4.5  CL 102  --  101 96* 105  --   --   CO2 23  --  28  --  20*  --   --   GLUCOSE 87  --  78 146* 126*  --   --   BUN 15  < > 17 17 12 16 17   CREATININE 0.78  < > 0.71 0.80 0.83 0.7 0.8  CALCIUM 9.9  --  9.6  --  9.5  --   --   < > = values in this interval not displayed. Liver Function Tests:  Recent Labs  02/05/16 1333 02/23/16 09/13/16 12/22/16  AST 30 32 24 22  ALT 26 35 20 15  ALKPHOS 90 95 57 59  BILITOT 0.7  --   --   --   PROT 7.2  --   --   --   ALBUMIN 4.2  --   --   --    No results for input(s): LIPASE, AMYLASE in the last 8760 hours. No results for input(s): AMMONIA in the last 8760 hours. CBC:  Recent Labs  02/05/16 1333  07/04/16 1140 08/23/16 1454 08/23/16 1539 09/13/16 12/22/16  WBC 7.1  < > 5.7 9.3  --  4.8 3.9  NEUTROABS 5.0  --   --  5.0  --   --   --   HGB 12.7  < > 12.2 13.0 12.9 11.6* 11.6*  HCT 37.7  < > 36.3 38.1 38.0 34* 34*  MCV 89.5  --  91.7 92.0  --   --   --   PLT 300  < >  292 340  --  302 254  < > = values in this interval not displayed. Cardiac Enzymes: No results for input(s): CKTOTAL, CKMB, CKMBINDEX, TROPONINI in the last 8760 hours. BNP: Invalid input(s): POCBNP No results found for: HGBA1C Lab Results  Component Value Date   TSH 0.92 09/13/2016   No results found for: VITAMINB12 No results found for: FOLATE No results found for: IRON, TIBC, FERRITIN  Imaging and Procedures obtained recently: Ct Head W & Wo Contrast  Result Date: 12/23/2016 CLINICAL DATA:  Vertigo. Unsteady gait over the last 4 months. Head struck chair 2 days ago. Creatinine was obtained [facility] at [address]Results: Creatinine 0.8 mg/dL. EXAM: CT HEAD WITHOUT AND WITH CONTRAST TECHNIQUE: Contiguous axial images were obtained from the base of the skull through the vertex without and with intravenous contrast CONTRAST:  75mL ISOVUE-300 IOPAMIDOL (ISOVUE-300) INJECTION 61% COMPARISON:  MRI 11/03/2015 FINDINGS: Brain: The brain  shows generalized atrophy. There chronic small-vessel ischemic changes of the white matter and pons. No sign of acute infarction, mass lesion, hemorrhage, hydrocephalus or extra-axial collection. No abnormal contrast enhancement occurs. Vascular: Major vessels at the base of the brain show flow. There is atherosclerotic calcification of the major vessels at the base of the brain. Skull: Negative Sinuses/Orbits: Clear/normal Other: None IMPRESSION: No acute or traumatic finding. Atrophy and chronic small-vessel ischemic changes. Atherosclerotic calcification of the major vessels at the base the brain. Electronically Signed   By: Paulina FusiMark  Shogry M.D.   On: 12/23/2016 11:09    Assessment/Plan  1. Hyponatremia Mild. Follow lab  2. Vitamin D deficiency - Cholecalciferol (VITAMIN D) 2000 units tablet; One daily for Vitamin D supplement  Dispense: 100 tablet; Refill: 5  3. PAF (paroxysmal atrial fibrillation) (HCC) NSR  4. Essential hypertension controlled  5. Gait abnormality Use walker  6. Memory deficit monitor

## 2017-01-30 ENCOUNTER — Other Ambulatory Visit: Payer: Self-pay | Admitting: *Deleted

## 2017-03-23 ENCOUNTER — Non-Acute Institutional Stay: Payer: Medicare Other | Admitting: Nurse Practitioner

## 2017-03-23 ENCOUNTER — Encounter: Payer: Self-pay | Admitting: Nurse Practitioner

## 2017-03-23 DIAGNOSIS — R269 Unspecified abnormalities of gait and mobility: Secondary | ICD-10-CM | POA: Diagnosis not present

## 2017-03-23 DIAGNOSIS — R200 Anesthesia of skin: Secondary | ICD-10-CM

## 2017-03-23 DIAGNOSIS — R413 Other amnesia: Secondary | ICD-10-CM

## 2017-03-23 DIAGNOSIS — E871 Hypo-osmolality and hyponatremia: Secondary | ICD-10-CM

## 2017-03-23 DIAGNOSIS — R42 Dizziness and giddiness: Secondary | ICD-10-CM

## 2017-03-23 DIAGNOSIS — I480 Paroxysmal atrial fibrillation: Secondary | ICD-10-CM

## 2017-03-23 DIAGNOSIS — I10 Essential (primary) hypertension: Secondary | ICD-10-CM | POA: Diagnosis not present

## 2017-03-23 DIAGNOSIS — D509 Iron deficiency anemia, unspecified: Secondary | ICD-10-CM | POA: Diagnosis not present

## 2017-03-23 DIAGNOSIS — K5900 Constipation, unspecified: Secondary | ICD-10-CM | POA: Diagnosis not present

## 2017-03-23 NOTE — Assessment & Plan Note (Signed)
Heart rate is in control, continue Amlodipine 5mg, ASA, Statin.   

## 2017-03-23 NOTE — Assessment & Plan Note (Signed)
12/22/16 Na 138

## 2017-03-23 NOTE — Assessment & Plan Note (Signed)
Controlled, continue Amlodipine 5mg  daily. 12/22/16 wbc 3.9, Hgb 12.2, plt 254, Na 138, K 4.5, Bun 17, creat 0.76, TP 6.1, albumin 3.4

## 2017-03-23 NOTE — Assessment & Plan Note (Signed)
12/23/16 MMSE 28/30

## 2017-03-23 NOTE — Assessment & Plan Note (Signed)
Hgb 12.2 12/22/16, continue FE

## 2017-03-23 NOTE — Assessment & Plan Note (Signed)
Stable, continue Colace daily, MiraLax prn.   

## 2017-03-23 NOTE — Assessment & Plan Note (Signed)
12/23/16 CT head w/wo CM: no acute or traumatic finding, atrophy and chronic small vessel ischemic changes, atherosclerotic calcification of the major vessels at the base of brain.  Continue prn Antivert.

## 2017-03-23 NOTE — Assessment & Plan Note (Addendum)
using cane, 12/23/16 CT head w/wo CM: no acute or traumatic finding, atrophy and chronic small vessel ischemic changes, atherosclerotic calcification of the major vessels at the base of brain.

## 2017-03-23 NOTE — Assessment & Plan Note (Signed)
No change. Left 2nd, 3rd, 4th

## 2017-03-23 NOTE — Progress Notes (Signed)
Location:  [facility] Room Number: 822 Place of Service:  ALF [phone]) Provider:  Raekwan Spelman, Manxie  NP  Murray Hodgkins, MD  Patient Care Team: Kimber Relic, MD as PCP - General (Internal Medicine) Venita Lick, MD as Consulting Physician (Orthopedic Surgery) Marykay Lex, MD as Consulting Physician (Cardiology) Kayman Snuffer Johnney Ou, NP as Nurse Practitioner (Internal Medicine)  Extended Emergency Contact Information Primary Emergency Contact: [name],G[place] Home Phone: [phone] Mobile Phone: [phone] Relation: Niece Secondary Emergency Contact: [name],[name]  [place] Mobile Phone: [phone] Relation: Friend  Code Status:  DNR Goals of care: Advanced Directive information Advanced Directives 03/23/2017  Does Patient Have a Medical Advance Directive? No  Type of Advance Directive -  Does patient want to make changes to medical advance directive? -  Copy of Healthcare Power of Attorney in Chart? -  Would patient like information on creating a medical advance directive? -  Pre-existing out of facility DNR order (yellow form or pink MOST form) -     Chief Complaint  Patient presents with  . Medical Management of Chronic Issues    HPI:  Pt is a 83 y.o. female seen today for evaluation of chronic medical conditions.     Persisted dizziness, vertigo, underwent Neurology eval 2016, MRI brain, prn Antivert, vertigo exercise. C/o worsened gait, using cane all the time now, no focal neurological deficits noted. Also c/o memory lapses, last MMSE 30/30. 12/23/16 CT head w/wo CM: no acute or traumatic finding, atrophy and chronic small vessel ischemic changes, atherosclerotic calcification of the major vessels at the base of brain.  Continue prn Antivert.    The left 2nd, 3rd, 4th finger numbness in am, better after getting up and moving around, denied pain or focal weakness.               Hx of hyponatremia, Na 138 12/22/16,  blood pressure is controlled on Amlodipine 5mg  daily, takes Fe daily, last Hgb 12.2 12/22/16 . Afib, heart rate is in control, on ASA and statin.   Past Medical History:  Diagnosis Date  . Arteriosclerotic cardiovascular disease   . Bee sting reaction 08/23/2016   Tongue swelling and difficulty swallowing /notes 08/23/2016  . Constipation   . Degenerative joint disease of right hip 10/15/14  . Dysrhythmia    Paroxysmal Atrial Fibrillation  . Fracture of multiple pubic rami (HCC) 10/15/14   Right inferior and superior pubic rami  . HOH (hard of hearing)    Wears bilateral hearing aids  . Hyperlipidemia   . Hypertension   . Macular degeneration   . Memory deficit   . Osteoporosis   . Pain in right foot   . Palpitations   . Paroxysmal atrial fibrillation (HCC)   . Thyroid nodule    Status post surgery  . Ulcer of hard palate   . Vertigo    Past Surgical History:  Procedure Laterality Date  . ABDOMINAL HYSTERECTOMY  1968  . APPENDECTOMY  1940's  . COLONOSCOPY    . FOOT SURGERY Left   . HARDWARE REMOVAL Right 02/16/2016   Procedure: HARDWARE REMOVAL RT HIP;  Surgeon: Sheral Apley, MD;  Location: [facility] OR;  Service: Orthopedics;  Laterality: Right;  . HIP ARTHROPLASTY Right 02/16/2016   Procedure: ARTHROPLASTY BIPOLAR HIP (HEMIARTHROPLASTY);  Surgeon: Sheral Apley, MD;  Location: [facility] OR;  Service: Orthopedics;  Laterality: Right;  . INTRAMEDULLARY (IM) NAIL INTERTROCHANTERIC Right 09/11/2015   Procedure: INTRAMEDULLARY (IM) NAIL  INTERTROCHANTRIC;  Surgeon: Sheral Apleyimothy D Murphy, MD;  [facility];  Service: Orthopedics;  Laterality: Right;  . KNEE SURGERY      Allergies  Allergen Reactions  . Benadryl [Diphenhydramine] Other (See Comments)    Muscular degeneration  . Penicillins Other (See Comments)    Has patient had a PCN reaction causing immediate rash, facial/tongue/throat swelling, SOB or lightheadedness with hypotension: Unknown Has patient had a PCN reaction causing  severe rash involving mucus membranes or skin necrosis: Unknown Has patient had a PCN reaction that required hospitalization: Unknown Has patient had a PCN reaction occurring within the last 10 years: No If all of the above answers are "NO", then may proceed with Cephalosporin use.     Allergies as of 03/23/2017      Reactions   Benadryl [diphenhydramine] Other (See Comments)   Muscular degeneration   Penicillins Other (See Comments)   Has patient had a PCN reaction causing immediate rash, facial/tongue/throat swelling, SOB or lightheadedness with hypotension: Unknown Has patient had a PCN reaction causing severe rash involving mucus membranes or skin necrosis: Unknown Has patient had a PCN reaction that required hospitalization: Unknown Has patient had a PCN reaction occurring within the last 10 years: No If all of the above answers are "NO", then may proceed with Cephalosporin use.      Medication List       Accurate as of 03/23/17 12:29 PM. Always use your most recent med list.          acetaminophen 500 MG tablet Commonly known as:  TYLENOL Take 500 mg by mouth every 6 (six) hours as needed (for pain, headaches, or fever).   amLODipine 5 MG tablet Commonly known as:  NORVASC Take 5 mg by mouth daily.   aspirin 325 MG tablet Take 325 mg by mouth daily.   atorvastatin 20 MG tablet Commonly known as:  LIPITOR Take 20 mg by mouth daily.   Biotin 10 MG Caps Take 10 mg by mouth daily.   docusate sodium 100 MG capsule Commonly known as:  COLACE Take 100 mg by mouth daily.   EPINEPHrine 0.3 mg/0.3 mL Soaj injection Commonly known as:  EPI-PEN Inject 0.3 mLs (0.3 mg total) into the muscle once as needed (anaphylaxis/allergic reaction).   ferrous gluconate 324 MG tablet Commonly known as:  FERGON Take 1 tablet (324 mg total) by mouth daily with breakfast.   meclizine 12.5 MG tablet Commonly known as:  ANTIVERT Take 1 tablet (12.5 mg total) by mouth 3 (three) times  daily as needed for dizziness.   Melatonin 3 MG Tabs Take 3 mg by mouth at bedtime as needed (for sleep).   multivitamin with minerals Tabs tablet Take 1 tablet by mouth daily.   polyethylene glycol packet Commonly known as:  MIRALAX / GLYCOLAX Take 17 g by mouth daily as needed (for constipation). MIX IN 4 OUNCES OF FLUID   PRESERVISION AREDS 2 PO Take 2 capsules by mouth daily.   Vitamin D 2000 units tablet One daily for Vitamin D supplement       Review of Systems  Constitutional: Negative for chills, diaphoresis and fever.  HENT: Positive for hearing loss. Negative for congestion, ear discharge, ear pain, nosebleeds and sore throat.        R ear, 90% hearing loss. Left hearing aid. Mouth sore from bee sting.   Eyes: Negative for photophobia and pain.       Macular degeneration R+L  Respiratory: Negative for cough, shortness of  breath, wheezing and stridor.   Cardiovascular: Negative for chest pain, palpitations and leg swelling.       RLE  Gastrointestinal: Negative for abdominal pain, constipation, diarrhea, nausea and vomiting.  Genitourinary: Negative for dysuria, frequency and urgency.  Musculoskeletal: Positive for gait problem. Negative for back pain, myalgias and neck pain.       Right hip pain with weight bearing, improved.   Skin: Negative for rash.       Right buttock stage II pressure ulcer healed, mild erythema in the area. Right hip surgical incision healed.   Neurological: Positive for dizziness and numbness. Negative for tremors, seizures, weakness and headaches.       The left 2nd, 3rd, 4th finger numbness in am  Hematological: Does not bruise/bleed easily.  Psychiatric/Behavioral: Positive for confusion. Negative for hallucinations and suicidal ideas. The patient is not nervous/anxious.        Mildly confusion of time and place.     Immunization History  Administered Date(s) Administered  . Influenza-Unspecified 09/07/2016  . PPD Test 02/18/2016  .  Tdap 10/07/2012   Pertinent  Health Maintenance Due  Topic Date Due  . PNA vac Low Risk Adult (1 of 2 - PCV13) 04/15/1991  . INFLUENZA VACCINE  06/21/2017  . DEXA SCAN  Completed   Fall Risk  10/24/2014  Falls in the past year? Yes  Number falls in past yr: 1  Injury with Fall? Yes  Risk Factor Category  High Fall Risk  Risk for fall due to : History of fall(s)   Functional Status Survey:    Vitals:   03/23/17 1221  BP: 140/70  Pulse: 66  Resp: 18  Temp: 97 F (36.1 C)  Weight: 121 lb (54.9 kg)  Height: 5\' 1"  (1.549 m)   Body mass index is 22.86 kg/m. Physical Exam  Constitutional: She appears well-developed and well-nourished. No distress.  HENT:  Head: Normocephalic and atraumatic.  Right Ear: External ear normal.  Left Ear: External ear normal.  Nose: Nose normal.  Mouth/Throat: Oropharynx is clear and moist. No oropharyngeal exudate.   the sore area on the roof of mouth, noted erythematous area about a quarter size, the patient stated it has been about 2-3 days.   Eyes: Conjunctivae and EOM are normal. Pupils are equal, round, and reactive to light. Right eye exhibits no discharge. Left eye exhibits no discharge. No scleral icterus.  Macular degeneration R+L, Ophthalmology q 6 weeks. Right side tong sore from bee sting.   Neck: Normal range of motion. Neck supple. No JVD present. No tracheal deviation present. No thyromegaly present.  Cardiovascular: Normal rate, regular rhythm and intact distal pulses.  Exam reveals no gallop and no friction rub.   No murmur heard. Pulmonary/Chest: Effort normal and breath sounds normal. No stridor. No respiratory distress. She has no wheezes. She has no rales. She exhibits no tenderness.  Abdominal: Soft. Bowel sounds are normal. She exhibits no distension and no mass. There is no tenderness. There is no rebound and no guarding.  Musculoskeletal: She exhibits tenderness. She exhibits no edema.  Right hip ROM with pain and weight  bearing, improved.   Lymphadenopathy:    She has no cervical adenopathy.  Neurological: She is alert. She displays normal reflexes. No cranial nerve deficit. She exhibits normal muscle tone. Coordination normal.  Skin: Skin is warm and dry. No rash noted. She is not diaphoretic. No erythema. No pallor.  Right buttock stage II pressure ulcer healed, mild erythema in the  area. Right hip surgical incisions healed.    Psychiatric: She has a normal mood and affect. Her behavior is normal. Judgment and thought content normal.    Labs reviewed:  Recent Labs  07/04/16 1140  08/23/16 1539 08/24/16 0950 09/13/16 12/22/16  NA 134*  --  132* 133* 137 138  K 4.4  --  3.8 3.7 4.3 4.5  CL 101  --  96* 105  --   --   CO2 28  --   --  20*  --   --   GLUCOSE 78  --  146* 126*  --   --   BUN 17  --  17 12 16 17   CREATININE 0.71  < > 0.80 0.83 0.7 0.8  CALCIUM 9.6  --   --  9.5  --   --   < > = values in this interval not displayed.  Recent Labs  09/13/16 12/22/16  AST 24 22  ALT 20 15  ALKPHOS 57 59    Recent Labs  07/04/16 1140 08/23/16 1454 08/23/16 1539 09/13/16 12/22/16  WBC 5.7 9.3  --  4.8 3.9  NEUTROABS  --  5.0  --   --   --   HGB 12.2 13.0 12.9 11.6* 11.6*  HCT 36.3 38.1 38.0 34* 34*  MCV 91.7 92.0  --   --   --   PLT 292 340  --  302 254   Lab Results  Component Value Date   TSH 0.92 09/13/2016   No results found for: HGBA1C Lab Results  Component Value Date   CHOL 139 09/13/2016   HDL 68 09/13/2016   LDLCALC 60 09/13/2016   TRIG 53 09/13/2016    Significant Diagnostic Results in last 30 days:  No results found.  Assessment/Plan Essential hypertension Controlled, continue Amlodipine 5mg  daily. 12/22/16 wbc 3.9, Hgb 12.2, plt 254, Na 138, K 4.5, Bun 17, creat 0.76, TP 6.1, albumin 3.4  PAF (paroxysmal atrial fibrillation) (HCC) Heart rate is in control, continue Amlodipine 5mg , ASA, Statin.   Constipation Stable, continue Colace daily, MiraLax prn.     Memory deficit 12/23/16 MMSE 28/30      Gait abnormality using cane, 12/23/16 CT head w/wo CM: no acute or traumatic finding, atrophy and chronic small vessel ischemic changes, atherosclerotic calcification of the major vessels at the base of brain.    Dizziness 12/23/16 CT head w/wo CM: no acute or traumatic finding, atrophy and chronic small vessel ischemic changes, atherosclerotic calcification of the major vessels at the base of brain.  Continue prn Antivert.   Hyponatremia 12/22/16 Na 138  Numbness of fingers No change. Left 2nd, 3rd, 4th   Anemia, iron deficiency Hgb 12.2 12/22/16, continue FE     Family/ staff Communication: AL  Labs/tests ordered:  none

## 2017-03-23 NOTE — Progress Notes (Signed)
Location:  [facility] Room Number: 822 Place of Service:  ALF [phone]) Provider:  Mast, Manxie  NP  Murray Hodgkins, MD  Patient Care Team: Kimber Relic, MD as PCP - General (Internal Medicine) Venita Lick, MD as Consulting Physician (Orthopedic Surgery) Marykay Lex, MD as Consulting Physician (Cardiology) Man Johnney Ou, NP as Nurse Practitioner (Internal Medicine)  Extended Emergency Contact Information Primary Emergency Contact: [name],[place] CT [place] Home Phone: [phone] Mobile Phone: [phone] Relation: Niece Secondary Emergency Contact: [name],[name]  [place] Mobile Phone: [phone] Relation: Friend  Code Status:  Full Code Goals of care: Advanced Directive information Advanced Directives 03/23/2017  Does Patient Have a Medical Advance Directive? No  Type of Advance Directive -  Does patient want to make changes to medical advance directive? -  Copy of Healthcare Power of Attorney in Chart? -  Would patient like information on creating a medical advance directive? -  Pre-existing out of facility DNR order (yellow form or pink MOST form) -     Chief Complaint  Patient presents with  . Medical Management of Chronic Issues    HPI:  Pt is a 83 y.o. female seen today for medical management of chronic diseases.     Past Medical History:  Diagnosis Date  . Arteriosclerotic cardiovascular disease   . Bee sting reaction 08/23/2016   Tongue swelling and difficulty swallowing /notes 08/23/2016  . Constipation   . Degenerative joint disease of right hip 10/15/14  . Dysrhythmia    Paroxysmal Atrial Fibrillation  . Fracture of multiple pubic rami (HCC) 10/15/14   Right inferior and superior pubic rami  . HOH (hard of hearing)    Wears bilateral hearing aids  . Hyperlipidemia   . Hypertension   . Macular degeneration   . Memory deficit   . Osteoporosis   . Pain in right foot   . Palpitations   .  Paroxysmal atrial fibrillation (HCC)   . Thyroid nodule    Status post surgery  . Ulcer of hard palate   . Vertigo    Past Surgical History:  Procedure Laterality Date  . ABDOMINAL HYSTERECTOMY  1968  . APPENDECTOMY  1940's  . COLONOSCOPY    . FOOT SURGERY Left   . HARDWARE REMOVAL Right 02/16/2016   Procedure: HARDWARE REMOVAL RT HIP;  Surgeon: Sheral Apley, MD;  Location: [facility] OR;  Service: Orthopedics;  Laterality: Right;  . HIP ARTHROPLASTY Right 02/16/2016   Procedure: ARTHROPLASTY BIPOLAR HIP (HEMIARTHROPLASTY);  Surgeon: Sheral Apley, MD;  Location: [facility] OR;  Service: Orthopedics;  Laterality: Right;  . INTRAMEDULLARY (IM) NAIL INTERTROCHANTERIC Right 09/11/2015   Procedure: INTRAMEDULLARY (IM) NAIL INTERTROCHANTRIC;  Surgeon: Sheral Apley, MD;  Location: [facility] OR;  Service: Orthopedics;  Laterality: Right;  . KNEE SURGERY      Allergies  Allergen Reactions  . Benadryl [Diphenhydramine] Other (See Comments)    Muscular degeneration  . Penicillins Other (See Comments)    Has patient had a PCN reaction causing immediate rash, facial/tongue/throat swelling, SOB or lightheadedness with hypotension: Unknown Has patient had a PCN reaction causing severe rash involving mucus membranes or skin necrosis: Unknown Has patient had a PCN reaction that required hospitalization: Unknown Has patient had a PCN reaction occurring within the last 10 years: No If all of the above answers are "NO", then may proceed with Cephalosporin use.     Outpatient Encounter Prescriptions as of 03/23/2017  Medication Sig  . acetaminophen (  TYLENOL) 500 MG tablet Take 500 mg by mouth every 6 (six) hours as needed (for pain, headaches, or fever).   Marland Kitchen. amLODipine (NORVASC) 5 MG tablet Take 5 mg by mouth daily.  Marland Kitchen. aspirin 325 MG tablet Take 325 mg by mouth daily.  Marland Kitchen. atorvastatin (LIPITOR) 20 MG tablet Take 20 mg by mouth daily.  . Biotin 10 MG CAPS Take 10 mg by mouth daily.   . Cholecalciferol (VITAMIN D)  2000 units tablet One daily for Vitamin D supplement  . docusate sodium (COLACE) 100 MG capsule Take 100 mg by mouth daily.  Marland Kitchen. EPINEPHrine 0.3 mg/0.3 mL IJ SOAJ injection Inject 0.3 mLs (0.3 mg total) into the muscle once as needed (anaphylaxis/allergic reaction).  . ferrous gluconate (FERGON) 324 MG tablet Take 1 tablet (324 mg total) by mouth daily with breakfast.  . meclizine (ANTIVERT) 12.5 MG tablet Take 1 tablet (12.5 mg total) by mouth 3 (three) times daily as needed for dizziness.  . Melatonin 3 MG TABS Take 3 mg by mouth at bedtime as needed (for sleep).  . Multiple Vitamin (MULTIVITAMIN WITH MINERALS) TABS Take 1 tablet by mouth daily.  . Multiple Vitamins-Minerals (PRESERVISION AREDS 2 PO) Take 2 capsules by mouth daily.  . polyethylene glycol (MIRALAX / GLYCOLAX) packet Take 17 g by mouth daily as needed (for constipation). MIX IN 4 OUNCES OF FLUID   No facility-administered encounter medications on file as of 03/23/2017.     Review of Systems  Immunization History  Administered Date(s) Administered  . Influenza-Unspecified 09/07/2016  . PPD Test 02/18/2016  . Tdap 10/07/2012   Pertinent  Health Maintenance Due  Topic Date Due  . PNA vac Low Risk Adult (1 of 2 - PCV13) 04/15/1991  . INFLUENZA VACCINE  06/21/2017  . DEXA SCAN  Completed   Fall Risk  10/24/2014  Falls in the past year? Yes  Number falls in past yr: 1  Injury with Fall? Yes  Risk Factor Category  High Fall Risk  Risk for fall due to : History of fall(s)   Functional Status Survey:    Vitals:   03/23/17 1221  BP: 140/70  Pulse: 66  Resp: 18  Temp: 97 F (36.1 C)  Weight: 121 lb (54.9 kg)  Height: 5\' 1"  (1.549 m)   Body mass index is 22.86 kg/m. Physical Exam  Labs reviewed:  Recent Labs  07/04/16 1140  08/23/16 1539 08/24/16 0950 09/13/16 12/22/16  NA 134*  --  132* 133* 137 138  K 4.4  --  3.8 3.7 4.3 4.5  CL 101  --  96* 105  --   --   CO2 28  --   --  20*  --   --   GLUCOSE 78  --   146* 126*  --   --   BUN 17  --  17 12 16 17   CREATININE 0.71  < > 0.80 0.83 0.7 0.8  CALCIUM 9.6  --   --  9.5  --   --   < > = values in this interval not displayed.  Recent Labs  09/13/16 12/22/16  AST 24 22  ALT 20 15  ALKPHOS 57 59    Recent Labs  07/04/16 1140 08/23/16 1454 08/23/16 1539 09/13/16 12/22/16  WBC 5.7 9.3  --  4.8 3.9  NEUTROABS  --  5.0  --   --   --   HGB 12.2 13.0 12.9 11.6* 11.6*  HCT 36.3 38.1 38.0 34* 34*  MCV 91.7 92.0  --   --   --   PLT 292 340  --  302 254   Lab Results  Component Value Date   TSH 0.92 09/13/2016   No results found for: HGBA1C Lab Results  Component Value Date   CHOL 139 09/13/2016   HDL 68 09/13/2016   LDLCALC 60 09/13/2016   TRIG 53 09/13/2016    Significant Diagnostic Results in last 30 days:  No results found.  Assessment/Plan 1. Essential hypertension   2. PAF (paroxysmal atrial fibrillation) (HCC)   3. Constipation, unspecified constipation type   4. Memory deficit   5. Gait abnormality   6. Dizziness   7. Hyponatremia   8. Numbness of fingers    Family/ staff Communication:    Labs/tests ordered:

## 2017-05-09 ENCOUNTER — Non-Acute Institutional Stay: Payer: Medicare Other | Admitting: Nurse Practitioner

## 2017-05-09 ENCOUNTER — Encounter: Payer: Self-pay | Admitting: Nurse Practitioner

## 2017-05-09 DIAGNOSIS — R42 Dizziness and giddiness: Secondary | ICD-10-CM

## 2017-05-09 DIAGNOSIS — D509 Iron deficiency anemia, unspecified: Secondary | ICD-10-CM

## 2017-05-09 DIAGNOSIS — I480 Paroxysmal atrial fibrillation: Secondary | ICD-10-CM | POA: Diagnosis not present

## 2017-05-09 DIAGNOSIS — I10 Essential (primary) hypertension: Secondary | ICD-10-CM | POA: Diagnosis not present

## 2017-05-09 NOTE — Assessment & Plan Note (Signed)
resolved dizziness spell, Hx of the issue, lasted about 2 hours, resolved on with no intervention.   Neurology referral, dc Luvenia ReddenUnison, R Melatonin 3mg  10/13/15 Neurology, Dr. Naomie DeanAntonia Ahern, MRI, continue with the vertigo exercise, prn Meclizine.  12/23/16 CT head w/wo CM: no acute or traumatic finding, atrophy and chronic small vessel ischemic changes, atherosclerotic calcification of the major vessels at the base of brain.  Will update CBC CMP, observe the patient.

## 2017-05-09 NOTE — Assessment & Plan Note (Signed)
Heart rate is in control, continue Amlodipine 5mg, ASA, Statin.   

## 2017-05-09 NOTE — Assessment & Plan Note (Signed)
Controlled, continue Amlodipine 5mg  daily. 12/22/16 wbc 3.9, Hgb 12.2, plt 254, Na 138, K 4.5, Bun 17, creat 0.76, TP 6.1, albumin 3.4

## 2017-05-09 NOTE — Progress Notes (Signed)
Location:  [facility] Room Number: 822 Place of Service:  ALF [phone]) Provider:  Cain Fitzhenry, Manxie  NP  Oneal Grout, MD  Patient Care Team: Oneal Grout, MD as PCP - General (Internal Medicine) Venita Lick, MD as Consulting Physician (Orthopedic Surgery) Marykay Lex, MD as Consulting Physician (Cardiology) Tanyla Stege X, NP as Nurse Practitioner (Internal Medicine)  Extended Emergency Contact Information Primary Emergency Contact: [name],[place] CT [place] Home Phone: [phone] Mobile Phone: [phone] Relation: Niece Secondary Emergency Contact: [name],[name]  [place] Mobile Phone: [phone] Relation: Friend  Code Status:  DNR Goals of care: Advanced Directive information Advanced Directives 05/09/2017  Does Patient Have a Medical Advance Directive? Yes  Type of Advance Directive Out of facility DNR (pink MOST or yellow form)  Does patient want to make changes to medical advance directive? No - Patient declined  Copy of Healthcare Power of Attorney in Chart? -  Would patient like information on creating a medical advance directive? -  Pre-existing out of facility DNR order (yellow form or pink MOST form) Yellow form placed in chart (order not valid for inpatient use)     Chief Complaint  Patient presents with  . Dizziness    resolved dizziness episode    HPI:  Pt is a 83 y.o. female seen today for an acute visit for resolved dizziness spell, Hx of the issue, lasted about 2 hours, resolved on with no intervention.   Neurology referral, dc [facility], R Melatonin 3mg  10/13/15 Neurology, Dr. Naomie Dean, MRI, continue with the vertigo exercise, prn Meclizine.  12/23/16 CT head w/wo CM: no acute or traumatic finding, atrophy and chronic small vessel ischemic changes, atherosclerotic calcification of the major vessels at the base of brain.    Hx of hyponatremia, Na 138 12/22/16,  blood pressure is controlled on  Amlodipine 5mg  daily, takes Fe daily, last Hgb 11.6 12/22/16, Afib, heart rate is in control, on ASA and statin.    Past Medical History:  Diagnosis Date  . Arteriosclerotic cardiovascular disease   . Bee sting reaction 08/23/2016   Tongue swelling and difficulty swallowing /notes 08/23/2016  . Constipation   . Degenerative joint disease of right hip 10/15/14  . Dysrhythmia    Paroxysmal Atrial Fibrillation  . Fracture of multiple pubic rami (HCC) 10/15/14   Right inferior and superior pubic rami  . HOH (hard of hearing)    Wears bilateral hearing aids  . Hyperlipidemia   . Hypertension   . Macular degeneration   . Memory deficit   . Osteoporosis   . Pain in right foot   . Palpitations   . Paroxysmal atrial fibrillation (HCC)   . Thyroid nodule    Status post surgery  . Ulcer of hard palate   . Vertigo    Past Surgical History:  Procedure Laterality Date  . ABDOMINAL HYSTERECTOMY  1968  . APPENDECTOMY  1940's  . COLONOSCOPY    . FOOT SURGERY Left   . HARDWARE REMOVAL Right 02/16/2016   Procedure: HARDWARE REMOVAL RT HIP;  Surgeon: Sheral Apley, MD;  Location: [facility] OR;  Service: Orthopedics;  Laterality: Right;  . HIP ARTHROPLASTY Right 02/16/2016   Procedure: ARTHROPLASTY BIPOLAR HIP (HEMIARTHROPLASTY);  Surgeon: Sheral Apley, MD;  Location: [facility] OR;  Service: Orthopedics;  Laterality: Right;  . INTRAMEDULLARY (IM) NAIL INTERTROCHANTERIC Right 09/11/2015   Procedure: INTRAMEDULLARY (IM) NAIL INTERTROCHANTRIC;  Surgeon: Sheral Apley, MD;  Location: [facility] OR;  Service: Orthopedics;  Laterality:  Right;  Marland Kitchen. KNEE SURGERY      Allergies  Allergen Reactions  . Benadryl [Diphenhydramine] Other (See Comments)    Muscular degeneration  . Penicillins Other (See Comments)    Has patient had a PCN reaction causing immediate rash, facial/tongue/throat swelling, SOB or lightheadedness with hypotension: Unknown Has patient had a PCN reaction causing severe rash involving mucus  membranes or skin necrosis: Unknown Has patient had a PCN reaction that required hospitalization: Unknown Has patient had a PCN reaction occurring within the last 10 years: No If all of the above answers are "NO", then may proceed with Cephalosporin use.     Outpatient Encounter Prescriptions as of 05/09/2017  Medication Sig  . acetaminophen (TYLENOL) 500 MG tablet Take 500 mg by mouth every 6 (six) hours as needed (for pain, headaches, or fever).   Marland Kitchen. amLODipine (NORVASC) 5 MG tablet Take 5 mg by mouth daily.  Marland Kitchen. aspirin 325 MG tablet Take 325 mg by mouth daily.  Marland Kitchen. atorvastatin (LIPITOR) 20 MG tablet Take 20 mg by mouth daily.  . Biotin 10 MG CAPS Take 10 mg by mouth daily.   . Cholecalciferol (VITAMIN D) 2000 units tablet One daily for Vitamin D supplement  . docusate sodium (COLACE) 100 MG capsule Take 100 mg by mouth daily.  Marland Kitchen. EPINEPHrine 0.3 mg/0.3 mL IJ SOAJ injection Inject 0.3 mLs (0.3 mg total) into the muscle once as needed (anaphylaxis/allergic reaction).  . ferrous gluconate (FERGON) 324 MG tablet Take 1 tablet (324 mg total) by mouth daily with breakfast.  . Melatonin 3 MG TABS Take 3 mg by mouth at bedtime as needed (for sleep).  . Multiple Vitamin (MULTIVITAMIN WITH MINERALS) TABS Take 1 tablet by mouth daily.  . Multiple Vitamins-Minerals (PRESERVISION AREDS 2 PO) Take 2 capsules by mouth daily.  . polyethylene glycol (MIRALAX / GLYCOLAX) packet Take 17 g by mouth daily as needed (for constipation). MIX IN 4 OUNCES OF FLUID  . [DISCONTINUED] meclizine (ANTIVERT) 12.5 MG tablet Take 1 tablet (12.5 mg total) by mouth 3 (three) times daily as needed for dizziness.   No facility-administered encounter medications on file as of 05/09/2017.     Review of Systems  Constitutional: Negative for chills, diaphoresis and fever.  HENT: Positive for hearing loss. Negative for congestion, ear discharge, ear pain, nosebleeds and sore throat.        R ear, 90% hearing loss. Left hearing aid.  Mouth sore from bee sting.   Eyes: Negative for photophobia and pain.       Macular degeneration R+L  Respiratory: Negative for cough, shortness of breath, wheezing and stridor.   Cardiovascular: Negative for chest pain, palpitations and leg swelling.       RLE  Gastrointestinal: Negative for abdominal pain, constipation, diarrhea, nausea and vomiting.  Genitourinary: Negative for dysuria, frequency and urgency.  Musculoskeletal: Positive for gait problem. Negative for back pain, myalgias and neck pain.       Right hip pain with weight bearing, improved.   Skin: Negative for rash.       Right buttock stage II pressure ulcer healed, mild erythema in the area. Right hip surgical incision healed.   Neurological: Positive for dizziness and numbness. Negative for tremors, seizures, weakness and headaches.       The left 2nd, 3rd, 4th finger numbness in am  Hematological: Does not bruise/bleed easily.  Psychiatric/Behavioral: Positive for confusion. Negative for hallucinations and suicidal ideas. The patient is not nervous/anxious.  Mildly confusion of time and place.     Immunization History  Administered Date(s) Administered  . Influenza-Unspecified 09/07/2016  . PPD Test 02/18/2016  . Tdap 10/07/2012   Pertinent  Health Maintenance Due  Topic Date Due  . PNA vac Low Risk Adult (1 of 2 - PCV13) 04/15/1991  . INFLUENZA VACCINE  06/21/2017  . DEXA SCAN  Completed   Fall Risk  10/24/2014  Falls in the past year? Yes  Number falls in past yr: 1  Injury with Fall? Yes  Risk Factor Category  High Fall Risk  Risk for fall due to : History of fall(s)   Functional Status Survey:    Vitals:   05/09/17 1625  BP: 120/76  Pulse: 72  Resp: 20  Temp: 98.9 F (37.2 C)  Weight: 121 lb (54.9 kg)  Height: 5\' 1"  (1.549 m)   Body mass index is 22.86 kg/m. Physical Exam  Constitutional: She appears well-developed and well-nourished. No distress.  HENT:  Head: Normocephalic and  atraumatic.  Right Ear: External ear normal.  Left Ear: External ear normal.  Nose: Nose normal.  Mouth/Throat: Oropharynx is clear and moist. No oropharyngeal exudate.   the sore area on the roof of mouth, noted erythematous area about a quarter size, the patient stated it has been about 2-3 days.   Eyes: Conjunctivae and EOM are normal. Pupils are equal, round, and reactive to light. Right eye exhibits no discharge. Left eye exhibits no discharge. No scleral icterus.  Macular degeneration R+L, Ophthalmology q 6 weeks. Right side tong sore from bee sting.   Neck: Normal range of motion. Neck supple. No JVD present. No tracheal deviation present. No thyromegaly present.  Cardiovascular: Normal rate, regular rhythm and intact distal pulses.  Exam reveals no gallop and no friction rub.   No murmur heard. Pulmonary/Chest: Effort normal and breath sounds normal. No stridor. No respiratory distress. She has no wheezes. She has no rales. She exhibits no tenderness.  Abdominal: Soft. Bowel sounds are normal. She exhibits no distension and no mass. There is no tenderness. There is no rebound and no guarding.  Musculoskeletal: She exhibits tenderness. She exhibits no edema.  Right hip ROM with pain and weight bearing, improved.   Lymphadenopathy:    She has no cervical adenopathy.  Neurological: She is alert. She displays normal reflexes. No cranial nerve deficit. She exhibits normal muscle tone. Coordination normal.  Skin: Skin is warm and dry. No rash noted. She is not diaphoretic. No erythema. No pallor.  Right buttock stage II pressure ulcer healed, mild erythema in the area. Right hip surgical incisions healed.    Psychiatric: She has a normal mood and affect. Her behavior is normal. Judgment and thought content normal.    Labs reviewed:  Recent Labs  07/04/16 1140  08/23/16 1539 08/24/16 0950 09/13/16 12/22/16  NA 134*  --  132* 133* 137 138  K 4.4  --  3.8 3.7 4.3 4.5  CL 101  --  96*  105  --   --   CO2 28  --   --  20*  --   --   GLUCOSE 78  --  146* 126*  --   --   BUN 17  --  17 12 16 17   CREATININE 0.71  < > 0.80 0.83 0.7 0.8  CALCIUM 9.6  --   --  9.5  --   --   < > = values in this interval not displayed.  Recent  Labs  09/13/16 12/22/16  AST 24 22  ALT 20 15  ALKPHOS 57 59    Recent Labs  07/04/16 1140 08/23/16 1454 08/23/16 1539 09/13/16 12/22/16  WBC 5.7 9.3  --  4.8 3.9  NEUTROABS  --  5.0  --   --   --   HGB 12.2 13.0 12.9 11.6* 11.6*  HCT 36.3 38.1 38.0 34* 34*  MCV 91.7 92.0  --   --   --   PLT 292 340  --  302 254   Lab Results  Component Value Date   TSH 0.92 09/13/2016   No results found for: HGBA1C Lab Results  Component Value Date   CHOL 139 09/13/2016   HDL 68 09/13/2016   LDLCALC 60 09/13/2016   TRIG 53 09/13/2016    Significant Diagnostic Results in last 30 days:  No results found.  Assessment/Plan Dizziness  resolved dizziness spell, Hx of the issue, lasted about 2 hours, resolved on with no intervention.   Neurology referral, dc [employer], R Melatonin 3mg  10/13/15 Neurology, Dr. Naomie Dean, MRI, continue with the vertigo exercise, prn Meclizine.  12/23/16 CT head w/wo CM: no acute or traumatic finding, atrophy and chronic small vessel ischemic changes, atherosclerotic calcification of the major vessels at the base of brain.  Will update CBC CMP, observe the patient.   Essential hypertension Controlled, continue Amlodipine 5mg  daily. 12/22/16 wbc 3.9, Hgb 12.2, plt 254, Na 138, K 4.5, Bun 17, creat 0.76, TP 6.1, albumin 3.4  PAF (paroxysmal atrial fibrillation) (HCC) Heart rate is in control, continue Amlodipine 5mg , ASA, Statin.   Anemia, iron deficiency Hgb 12.2 12/22/16, continue FE     Family/ staff Communication: AL  Labs/tests ordered:  CBC CMP

## 2017-05-09 NOTE — Assessment & Plan Note (Signed)
Hgb 12.2 12/22/16, continue FE

## 2017-05-11 LAB — CBC AND DIFFERENTIAL
HEMATOCRIT: 34 — AB (ref 36–46)
HEMOGLOBIN: 11.7 — AB (ref 12.0–16.0)
PLATELETS: 235 (ref 150–399)
WBC: 4.3

## 2017-05-11 LAB — BASIC METABOLIC PANEL
BUN: 11 (ref 4–21)
Creatinine: 0.7 (ref 0.5–1.1)
GLUCOSE: 77
Potassium: 4.3 (ref 3.4–5.3)
Sodium: 136 — AB (ref 137–147)

## 2017-05-11 LAB — HEPATIC FUNCTION PANEL
ALT: 19 (ref 7–35)
AST: 27 (ref 13–35)
Alkaline Phosphatase: 48 (ref 25–125)
BILIRUBIN, TOTAL: 0.6

## 2017-06-22 ENCOUNTER — Encounter: Payer: Self-pay | Admitting: Internal Medicine

## 2017-06-22 ENCOUNTER — Non-Acute Institutional Stay: Payer: Medicare Other | Admitting: Internal Medicine

## 2017-06-22 DIAGNOSIS — H8113 Benign paroxysmal vertigo, bilateral: Secondary | ICD-10-CM | POA: Diagnosis not present

## 2017-06-22 DIAGNOSIS — G5601 Carpal tunnel syndrome, right upper limb: Secondary | ICD-10-CM

## 2017-06-22 DIAGNOSIS — I10 Essential (primary) hypertension: Secondary | ICD-10-CM | POA: Diagnosis not present

## 2017-06-22 DIAGNOSIS — E782 Mixed hyperlipidemia: Secondary | ICD-10-CM | POA: Diagnosis not present

## 2017-06-22 DIAGNOSIS — D638 Anemia in other chronic diseases classified elsewhere: Secondary | ICD-10-CM | POA: Diagnosis not present

## 2017-06-22 DIAGNOSIS — E871 Hypo-osmolality and hyponatremia: Secondary | ICD-10-CM | POA: Diagnosis not present

## 2017-06-22 NOTE — Progress Notes (Signed)
Location:  [facility] Room Number: 822 Place of Service:  ALF [phone]) Provider:  Oneal Grout MD  Oneal Grout, MD  Patient Care Team: Oneal Grout, MD as PCP - General (Internal Medicine) Venita Lick, MD as Consulting Physician (Orthopedic Surgery) Marykay Lex, MD as Consulting Physician (Cardiology) Mast, Man X, NP as Nurse Practitioner (Internal Medicine)  Extended Emergency Contact Information Primary Emergency Contact: [name],Jaasia CT [place] Home Phone: [phone] Mobile Phone: [phone] Relation: Niece Secondary Emergency Contact: [name],[name]  [place] Mobile Phone: [phone] Relation: Friend  Code Status:  DNR Goals of care: Advanced Directive information Advanced Directives 05/09/2017  Does Patient Have a Medical Advance Directive? Yes  Type of Advance Directive Out of facility DNR (pink MOST or yellow form)  Does patient want to make changes to medical advance directive? No - Patient declined  Copy of Healthcare Power of Attorney in Chart? -  Would patient like information on creating a medical advance directive? -  Pre-existing out of facility DNR order (yellow form or pink MOST form) Yellow form placed in chart (order not valid for inpatient use)     Chief Complaint  Patient presents with  . Medical Management of Chronic Issues    Routine Visit     HPI:  Pt is a 83 y.o. [place] seen today for medical management of chronic diseases. She complaints of being dizzy with position change. This has been going on for about 6 months and this makes her feel unsteady at times. She had a fall last week while playing ball passing in a room and bruised/ scraped her knees. Denies any tinnitus.  She also complaints of occasional numbness and tingling to her right arm and neck area with discomfort to right shoulder area. Writing and holding her arm in one position for long brings it on and moving her arms  resolves it. She has been complaint with her medications. Denies any other complaints. She resides in assisted living facility.     Past Medical History:  Diagnosis Date  . Arteriosclerotic cardiovascular disease   . Bee sting reaction 08/23/2016   Tongue swelling and difficulty swallowing /notes 08/23/2016  . Constipation   . Degenerative joint disease of right hip 10/15/14  . Dysrhythmia    Paroxysmal Atrial Fibrillation  . Fracture of multiple pubic rami (HCC) 10/15/14   Right inferior and superior pubic rami  . HOH (hard of hearing)    Wears bilateral hearing aids  . Hyperlipidemia   . Hypertension   . Macular degeneration   . Memory deficit   . Osteoporosis   . Pain in right foot   . Palpitations   . Paroxysmal atrial fibrillation (HCC)   . Thyroid nodule    Status post surgery  . Ulcer of hard palate   . Vertigo    Past Surgical History:  Procedure Laterality Date  . ABDOMINAL HYSTERECTOMY  1968  . APPENDECTOMY  1940's  . COLONOSCOPY    . FOOT SURGERY Left   . HARDWARE REMOVAL Right 02/16/2016   Procedure: HARDWARE REMOVAL RT HIP;  Surgeon: Sheral Apley, MD;  Location: [facility] OR;  Service: Orthopedics;  Laterality: Right;  . HIP ARTHROPLASTY Right 02/16/2016   Procedure: ARTHROPLASTY BIPOLAR HIP (HEMIARTHROPLASTY);  Surgeon: Sheral Apley, MD;  Location: [facility] OR;  Service: Orthopedics;  Laterality: Right;  . INTRAMEDULLARY (IM) NAIL INTERTROCHANTERIC Right 09/11/2015   Procedure: INTRAMEDULLARY (IM) NAIL INTERTROCHANTRIC;  Surgeon: Sheral Apley, MD;  Location:  [facility] OR;  Service: Orthopedics;  Laterality: Right;  . KNEE SURGERY      Allergies  Allergen Reactions  . Benadryl [Diphenhydramine] Other (See Comments)    Muscular degeneration  . Penicillins Other (See Comments)    Has patient had a PCN reaction causing immediate rash, facial/tongue/throat swelling, SOB or lightheadedness with hypotension: Unknown Has patient had a PCN reaction causing severe rash  involving mucus membranes or skin necrosis: Unknown Has patient had a PCN reaction that required hospitalization: Unknown Has patient had a PCN reaction occurring within the last 10 years: No If all of the above answers are "NO", then may proceed with Cephalosporin use.     Outpatient Encounter Prescriptions as of 06/22/2017  Medication Sig  . acetaminophen (TYLENOL) 500 MG tablet Take 500 mg by mouth every 6 (six) hours as needed (for pain, headaches, or fever).   Marland Kitchen. amLODipine (NORVASC) 5 MG tablet Take 5 mg by mouth daily.  Marland Kitchen. aspirin 325 MG tablet Take 325 mg by mouth daily.  Marland Kitchen. atorvastatin (LIPITOR) 20 MG tablet Take 20 mg by mouth daily.  . Biotin 9604510000 MCG TABS Take 1 tablet by mouth daily.  . Cholecalciferol (VITAMIN D) 2000 units tablet One daily for Vitamin D supplement  . docusate sodium (COLACE) 100 MG capsule Take 100 mg by mouth 2 (two) times daily.   Marland Kitchen. EPINEPHrine 0.3 mg/0.3 mL IJ SOAJ injection Inject 0.3 mLs (0.3 mg total) into the muscle once as needed (anaphylaxis/allergic reaction).  . ferrous gluconate (FERGON) 324 MG tablet Take 1 tablet (324 mg total) by mouth daily with breakfast.  . meclizine (ANTIVERT) 12.5 MG tablet Take 12.5 mg by mouth 3 (three) times daily as needed for dizziness.  . Melatonin 3 MG TABS Take 3 mg by mouth at bedtime as needed (for sleep).  . Multiple Vitamin (MULTIVITAMIN WITH MINERALS) TABS Take 1 tablet by mouth daily.  . Multiple Vitamins-Minerals (PRESERVISION AREDS 2 PO) Take 2 capsules by mouth daily.  . polyethylene glycol (MIRALAX / GLYCOLAX) packet Take 17 g by mouth daily as needed (for constipation). MIX IN 4 OUNCES OF FLUID  . [DISCONTINUED] Biotin 10 MG CAPS Take 10 mg by mouth daily.    No facility-administered encounter medications on file as of 06/22/2017.     Review of Systems  Constitutional: Negative for appetite change, chills, diaphoresis, fatigue and fever.  HENT: Positive for hearing loss. Negative for congestion, drooling,  ear discharge, ear pain, facial swelling, mouth sores, nosebleeds, postnasal drip, rhinorrhea, sinus pressure, sore throat, tinnitus, trouble swallowing and voice change.        Hearing aid to left ear  Eyes: Positive for visual disturbance. Negative for pain, discharge, redness and itching.       Has corrective lenses, has macular degeneration to both eyes  Respiratory: Negative for cough, choking, chest tightness, shortness of breath and wheezing.   Cardiovascular: Negative for chest pain, palpitations and leg swelling.  Gastrointestinal: Negative for abdominal pain, blood in stool, constipation, diarrhea, nausea, rectal pain and vomiting.  Genitourinary: Negative for dysuria, flank pain and hematuria.  Musculoskeletal: Positive for arthralgias and gait problem. Negative for back pain, joint swelling, neck pain and neck stiffness.       Arthritis pain to right shoulder, uses a walker and cane  Skin: Negative for color change, pallor, rash and wound.  Neurological: Positive for dizziness and numbness. Negative for tremors, seizures, syncope, speech difficulty and headaches.  Hematological: Does not bruise/bleed easily.  Psychiatric/Behavioral: Negative for agitation,  behavioral problems, confusion, hallucinations, sleep disturbance and suicidal ideas. The patient is not nervous/anxious.     Immunization History  Administered Date(s) Administered  . Influenza-Unspecified 09/07/2016  . PPD Test 02/18/2016  . Tdap 10/07/2012   Pertinent  Health Maintenance Due  Topic Date Due  . INFLUENZA VACCINE  08/21/2017 (Originally 06/21/2017)  . PNA vac Low Risk Adult (1 of 2 - PCV13) 11/21/2017 (Originally 04/15/1991)  . DEXA SCAN  Completed   Fall Risk  10/24/2014  Falls in the past year? Yes  Number falls in past yr: 1  Injury with Fall? Yes  Risk Factor Category  High Fall Risk  Risk for fall due to : History of fall(s)   Functional Status Survey:    Vitals:   06/22/17 1247  BP: 122/64    Pulse: 76  Resp: 18  Temp: (!) 97 F (36.1 C)  TempSrc: Oral  Weight: 121 lb (54.9 kg)  Height: 5\' 1"  (1.549 m)   Body mass index is 22.86 kg/m. Physical Exam  Constitutional: She is oriented to person, place, and time. She appears well-developed and well-nourished. No distress.  HENT:  Head: Normocephalic and atraumatic.  Right Ear: External ear normal.  Left Ear: External ear normal.  Mouth/Throat: Oropharynx is clear and moist.  Left hearing aid  Eyes: Pupils are equal, round, and reactive to light. Conjunctivae and EOM are normal.  Neck: Normal range of motion. Neck supple. No JVD present. No thyromegaly present.  Cardiovascular: Normal rate and regular rhythm.   Pulmonary/Chest: Effort normal and breath sounds normal. No respiratory distress. She has no wheezes. She has no rales. She exhibits no tenderness.  Abdominal: Soft. Bowel sounds are normal. She exhibits no distension. There is no tenderness. There is no rebound and no guarding.  Musculoskeletal: She exhibits no edema.  Unsteady gait, uses a walker and cane. Can move all 4 extremities, limited ROM with her shoulders, arthritis changes to fingers, mild stooping posture, has kyphosis  Lymphadenopathy:    She has no cervical adenopathy.  Neurological: She is alert and oriented to person, place, and time.  phalen's sign positive  Skin: Skin is warm and dry. No rash noted. She is not diaphoretic. No erythema.  Psychiatric: She has a normal mood and affect. Her behavior is normal. Thought content normal.    Labs reviewed:  Recent Labs  07/04/16 1140  08/23/16 1539 08/24/16 0950 09/13/16 12/22/16 05/11/17  NA 134*  --  132* 133* 137 138 136*  K 4.4  --  3.8 3.7 4.3 4.5 4.3  CL 101  --  96* 105  --   --   --   CO2 28  --   --  20*  --   --   --   GLUCOSE 78  --  146* 126*  --   --   --   BUN 17  --  17 12 16 17 11   CREATININE 0.71  < > 0.80 0.83 0.7 0.8 0.7  CALCIUM 9.6  --   --  9.5  --   --   --   < > = values  in this interval not displayed.  Recent Labs  09/13/16 12/22/16 05/11/17  AST 24 22 27   ALT 20 15 19   ALKPHOS 57 59 48    Recent Labs  07/04/16 1140 08/23/16 1454  09/13/16 12/22/16 05/11/17  WBC 5.7 9.3  --  4.8 3.9 4.3  NEUTROABS  --  5.0  --   --   --   --  HGB 12.2 13.0  < > 11.6* 11.6* 11.7*  HCT 36.3 38.1  < > 34* 34* 34*  MCV 91.7 92.0  --   --   --   --   PLT 292 340  --  302 254 235  < > = values in this interval not displayed. Lab Results  Component Value Date   TSH 0.92 09/13/2016   No results found for: HGBA1C Lab Results  Component Value Date   CHOL 139 09/13/2016   HDL 68 09/13/2016   LDLCALC 60 09/13/2016   TRIG 53 09/13/2016    Significant Diagnostic Results in last 30 days:  No results found.  Assessment/Plan  BPPV Currently on meclizine 12.5 mg tid prn. Advised on Epley maneuvers. Advised to use her walking aid every time and slow position change encouraged. Change meclizine to 12.5 mg daily with additional 12.5 mg bid as needed.   Chronic hyponatremia Maintain hydration. Mentation intact. Monitor clinically.  Anemia of chronic disease Check cbc periodically. Normal MCV. Stable H&H.   Carpal tunnel syndrome Phalen's test positive. Numbness and tingling to her arm and fingers on right side. No loss of strength. sensation to touch and temp intact. Able to rest at night. Mild carpal tunnel features. Will write for wrist brace to right hand to help with her numbness and tingling and monitor.   Hypertension BP stable with some reading on lower side of normal 110/54, 114/52, 118/72. Currently on amlodipine 5 mg daily. Decrease this to 2.5 mg daily. Monitor BP daily x 2 weeks and notify if > 3 reading > 150/90  Hyperlipidemia Lipid Panel     Component Value Date/Time   CHOL 139 09/13/2016   TRIG 53 09/13/2016   HDL 68 09/13/2016   LDLCALC 60 09/13/2016  LDL at goal in 08/2016. Recheck lipid panel. Continue atorvastatin 20 mg daily.   Family/  staff Communication: reviewed care plan with patient and charge nurse.    Labs/tests ordered:  Lipid panel  Spent 45 minutes with patient of which more than 50% related to direct patient care.     Oneal Grout, MD Internal Medicine [facility] [address] [place], [place] [address] Cell Phone (Monday-Friday 8 am - 5 pm): [phone] On Call: [phone] and follow prompts after 5 pm and on weekends Office Phone: [phone] Office Fax: [phone]

## 2017-06-27 LAB — TSH: TSH: 1.69 (ref <=5.90)

## 2017-06-28 ENCOUNTER — Other Ambulatory Visit: Payer: Self-pay | Admitting: *Deleted

## 2017-08-03 ENCOUNTER — Non-Acute Institutional Stay: Payer: Medicare Other

## 2017-08-03 DIAGNOSIS — Z0000 Encounter for general adult medical examination without abnormal findings: Secondary | ICD-10-CM

## 2017-08-03 NOTE — Progress Notes (Signed)
Subjective:   Candice Willis is a 83 y.o. female who presents for Medicare Annual (Subsequent) preventive examination at [facility]   Last AWV-01/20/2012    Objective:     Vitals: BP 118/60 (BP Location: Left Arm, Patient Position: Sitting)   Pulse 67   Temp 97.7 F (36.5 C) (Oral)   Ht  (1.549 m)   Wt 121 lb (54.9 kg)   SpO2 97%   BMI 22.86 kg/m   Body mass index is 22.86 kg/m.   Tobacco History  Smoking Status  . Former Smoker  . Packs/day: 0.50  . Types: Cigarettes  . Quit date: 10/05/1971  Smokeless Tobacco  . Never Used     Counseling given: Not Answered   Past Medical History:  Diagnosis Date  . Arteriosclerotic cardiovascular disease   . Bee sting reaction 08/23/2016   Tongue swelling and difficulty swallowing /notes 08/23/2016  . Constipation   . Degenerative joint disease of right hip 10/15/14  . Dysrhythmia    Paroxysmal Atrial Fibrillation  . Fracture of multiple pubic rami (HCC) 10/15/14   Right inferior and superior pubic rami  . HOH (hard of hearing)    Wears bilateral hearing aids  . Hyperlipidemia   . Hypertension   . Macular degeneration   . Memory deficit   . Osteoporosis   . Pain in right foot   . Palpitations   . Paroxysmal atrial fibrillation (HCC)   . Thyroid nodule    Status post surgery  . Ulcer of hard palate   . Vertigo    Past Surgical History:  Procedure Laterality Date  . ABDOMINAL HYSTERECTOMY  1968  . APPENDECTOMY  1940's  . COLONOSCOPY    . FOOT SURGERY Left   . HARDWARE REMOVAL Right 02/16/2016   Procedure: HARDWARE REMOVAL RT HIP;  Surgeon: Sheral Apley, MD;  Location: [facility] OR;  Service: Orthopedics;  Laterality: Right;  . HIP ARTHROPLASTY Right 02/16/2016   Procedure: ARTHROPLASTY BIPOLAR HIP (HEMIARTHROPLASTY);  Surgeon: Sheral Apley, MD;  Location: [facility] OR;  Service: Orthopedics;  Laterality: Right;  . INTRAMEDULLARY (IM) NAIL INTERTROCHANTERIC Right 09/11/2015   Procedure:  INTRAMEDULLARY (IM) NAIL INTERTROCHANTRIC;  Surgeon: Sheral Apley, MD;  Location: [facility] OR;  Service: Orthopedics;  Laterality: Right;  . KNEE SURGERY     Family History  Problem Relation Age of Onset  . Heart disease Father   . Cancer Father        prostate  . Cancer Maternal Grandmother        colon  . Heart disease Paternal Grandmother   . Hyperlipidemia Sister   . Cancer Sister        breast  . Heart disease Brother   . Hyperlipidemia Brother   . Cancer Brother        prostate   History  Sexual Activity  . Sexual activity: No    Outpatient Encounter Prescriptions as of 08/03/2017  Medication Sig  . acetaminophen (TYLENOL) 500 MG tablet Take 500 mg by mouth every 6 (six) hours as needed (for pain, headaches, or fever).   Marland Kitchen amLODipine (NORVASC) 2.5 MG tablet Take 2.5 mg by mouth daily.  Marland Kitchen aspirin 325 MG tablet Take 325 mg by mouth daily.  Marland Kitchen atorvastatin (LIPITOR) 20 MG tablet Take 20 mg by mouth daily.  . Biotin 16109 MCG TABS Take 1 tablet by mouth daily.  . Cholecalciferol (VITAMIN D) 2000 units tablet One daily for Vitamin D supplement  .  docusate sodium (COLACE) 100 MG capsule Take 100 mg by mouth 2 (two) times daily.   Marland Kitchen. EPINEPHrine 0.3 mg/0.3 mL IJ SOAJ injection Inject 0.3 mLs (0.3 mg total) into the muscle once as needed (anaphylaxis/allergic reaction).  . ferrous gluconate (FERGON) 324 MG tablet Take 1 tablet (324 mg total) by mouth daily with breakfast.  . meclizine (ANTIVERT) 12.5 MG tablet Take 12.5 mg by mouth 3 (three) times daily as needed for dizziness.  . Melatonin 3 MG TABS Take 3 mg by mouth at bedtime as needed (for sleep).  . Multiple Vitamin (MULTIVITAMIN WITH MINERALS) TABS Take 1 tablet by mouth daily.  . Multiple Vitamins-Minerals (PRESERVISION AREDS 2 PO) Take 2 capsules by mouth daily.  . polyethylene glycol (MIRALAX / GLYCOLAX) packet Take 17 g by mouth daily as needed (for constipation). MIX IN 4 OUNCES OF FLUID  . [DISCONTINUED] amLODipine  (NORVASC) 5 MG tablet Take 5 mg by mouth daily.   No facility-administered encounter medications on file as of 08/03/2017.     Activities of Daily Living In your present state of health, do you have any difficulty performing the following activities: 08/03/2017 08/23/2016  Hearing? Malvin JohnsY Y  Vision? Y N  Difficulty concentrating or making decisions? N Y  Walking or climbing stairs? Y Y  Dressing or bathing? Y Y  Doing errands, shopping? Y Y  Comment - "I don't drive anymore"  Preparing Food and eating ? Y -  Using the Toilet? N -  In the past six months, have you accidently leaked urine? Y -  Do you have problems with loss of bowel control? N -  Managing your Medications? Y -  Managing your Finances? Y -  Housekeeping or managing your Housekeeping? Y -  Some recent data might be hidden    Patient Care Team: Oneal GroutPandey, Mahima, MD as PCP - General (Internal Medicine) Venita LickBrooks, Dahari, MD as Consulting Physician (Orthopedic Surgery) Marykay LexHarding, David W, MD as Consulting Physician (Cardiology) Mast, Man X, NP as Nurse Practitioner (Internal Medicine)    Assessment:     Exercise Activities and Dietary recommendations Current Exercise Habits: The patient does not participate in regular exercise at present, Exercise limited by: None identified  Goals    None     Fall Risk Fall Risk  08/03/2017 10/24/2014  Falls in the past year? No Yes  Number falls in past yr: - 1  Injury with Fall? - Yes  Risk Factor Category  - High Fall Risk  Risk for fall due to : - History of fall(s)   Depression Screen PHQ 2/9 Scores 08/03/2017 10/24/2014  PHQ - 2 Score 0 0     Cognitive Function MMSE - Mini Mental State Exam 12/29/2016 12/29/2016  Orientation to time 5 5  Orientation to Place 4 4  Registration 3 3  Attention/ Calculation 5 5  Recall 3 3  Language- name 2 objects 2 2  Language- repeat 0 0  Language- follow 3 step command 3 3  Language- read & follow direction 1 1  Write a sentence 1 1  Copy  design 1 1  Total score 28 28        Immunization History  Administered Date(s) Administered  . Influenza-Unspecified 09/07/2016  . PPD Test 02/18/2016  . Tdap 10/07/2012   Screening Tests Health Maintenance  Topic Date Due  . INFLUENZA VACCINE  08/21/2017 (Originally 06/21/2017)  . PNA vac Low Risk Adult (1 of 2 - PCV13) 11/21/2017 (Originally 04/15/1991)  . TETANUS/TDAP  10/07/2022  .  DEXA SCAN  Completed      Plan:    I have personally reviewed and addressed the Medicare Annual Wellness questionnaire and have noted the following in the patient's chart:  A. Medical and social history B. Use of alcohol, tobacco or illicit drugs  C. Current medications and supplements D. Functional ability and status E.  Nutritional status F.  Physical activity G. Advance directives H. List of other physicians I.  Hospitalizations, surgeries, and ER visits in previous 12 months J.  Vitals K. Screenings to include hearing, vision, cognitive, depression L. Referrals and appointments - none  In addition, I have reviewed and discussed with patient certain preventive protocols, quality metrics, and best practice recommendations. A written personalized care plan for preventive services as well as general preventive health recommendations were provided to patient.  See attached scanned questionnaire for additional information.   Signed,   Annetta Maw, RN Nurse Health Advisor   Quick Notes   Health Maintenance: PNA 13 due. Pt will get flu vaccine when facility gets it in stock.     Abnormal Screen: MMSE 28/30 on 12/29/16     Patient Concerns: Dizziness in mornings.     Nurse Concerns: None

## 2017-08-03 NOTE — Patient Instructions (Signed)
Candice Willis , Thank you for taking time to come for your Medicare Wellness Visit. I appreciate your ongoing commitment to your health goals. Please review the following plan we discussed and let me know if I can assist you in the future.   Screening recommendations/referrals: Colonoscopy excluded, pt is over age 81 Mammogram excluded, pt is over age 81 Bone Density up to date Recommended yearly ophthalmology/optometry visit for glaucoma screening and checkup Recommended yearly dental visit for hygiene and checkup  Vaccinations: Influenza vaccine due Pneumococcal vaccine 13 due Tdap vaccine up to date. Due 10/07/22 Shingles vaccine not in records    Advanced directives: Need a copy for chart  Conditions/risks identified: None  Next appointment: Dr. Glade LloydPandey makes rounds   Preventive Care 65 Years and Older, Female Preventive care refers to lifestyle choices and visits with your health care provider that can promote health and wellness. What does preventive care include?  A yearly physical exam. This is also called an annual well check.  Dental exams once or twice a year.  Routine eye exams. Ask your health care provider how often you should have your eyes checked.  Personal lifestyle choices, including:  Daily care of your teeth and gums.  Regular physical activity.  Eating a healthy diet.  Avoiding tobacco and drug use.  Limiting alcohol use.  Practicing safe sex.  Taking low-dose aspirin every day.  Taking vitamin and mineral supplements as recommended by your health care provider. What happens during an annual well check? The services and screenings done by your health care provider during your annual well check will depend on your age, overall health, lifestyle risk factors, and family history of disease. Counseling  Your health care provider may ask you questions about your:  Alcohol use.  Tobacco use.  Drug use.  Emotional well-being.  Home and  relationship well-being.  Sexual activity.  Eating habits.  History of falls.  Memory and ability to understand (cognition).  Work and work Astronomerenvironment.  Reproductive health. Screening  You may have the following tests or measurements:  Height, weight, and BMI.  Blood pressure.  Lipid and cholesterol levels. These may be checked every 5 years, or more frequently if you are over 81 years old.  Skin check.  Lung cancer screening. You may have this screening every year starting at age 81 if you have a 30-pack-year history of smoking and currently smoke or have quit within the past 15 years.  Fecal occult blood test (FOBT) of the stool. You may have this test every year starting at age 81.  Flexible sigmoidoscopy or colonoscopy. You may have a sigmoidoscopy every 5 years or a colonoscopy every 10 years starting at age 81.  Hepatitis C blood test.  Hepatitis B blood test.  Sexually transmitted disease (STD) testing.  Diabetes screening. This is done by checking your blood sugar (glucose) after you have not eaten for a while (fasting). You may have this done every 1-3 years.  Bone density scan. This is done to screen for osteoporosis. You may have this done starting at age 81.  Mammogram. This may be done every 1-2 years. Talk to your health care provider about how often you should have regular mammograms. Talk with your health care provider about your test results, treatment options, and if necessary, the need for more tests. Vaccines  Your health care provider may recommend certain vaccines, such as:  Influenza vaccine. This is recommended every year.  Tetanus, diphtheria, and acellular pertussis (Tdap, Td) vaccine.  You may need a Td booster every 10 years.  Zoster vaccine. You may need this after age 24.  Pneumococcal 13-valent conjugate (PCV13) vaccine. One dose is recommended after age 42.  Pneumococcal polysaccharide (PPSV23) vaccine. One dose is recommended after  age 68. Talk to your health care provider about which screenings and vaccines you need and how often you need them. This information is not intended to replace advice given to you by your health care provider. Make sure you discuss any questions you have with your health care provider. Document Released: 12/04/2015 Document Revised: 07/27/2016 Document Reviewed: 09/08/2015 Elsevier Interactive Patient Education  2017 [place] Prevention in the Home Falls can cause injuries. They can happen to people of all ages. There are many things you can do to make your home safe and to help prevent falls. What can I do on the outside of my home?  Regularly fix the edges of walkways and driveways and fix any cracks.  Remove anything that might make you trip as you walk through a door, such as a raised step or threshold.  Trim any bushes or trees on the path to your home.  Use bright outdoor lighting.  Clear any walking paths of anything that might make someone trip, such as rocks or tools.  Regularly check to see if handrails are loose or broken. Make sure that both sides of any steps have handrails.  Any raised decks and porches should have guardrails on the edges.  Have any leaves, snow, or ice cleared regularly.  Use sand or salt on walking paths during winter.  Clean up any spills in your garage right away. This includes oil or grease spills. What can I do in the bathroom?  Use night lights.  Install grab bars by the toilet and in the tub and shower. Do not use towel bars as grab bars.  Use non-skid mats or decals in the tub or shower.  If you need to sit down in the shower, use a plastic, non-slip stool.  Keep the floor dry. Clean up any water that spills on the floor as soon as it happens.  Remove soap buildup in the tub or shower regularly.  Attach bath mats securely with double-sided non-slip rug tape.  Do not have throw rugs and other things on the floor that can  make you trip. What can I do in the bedroom?  Use night lights.  Make sure that you have a light by your bed that is easy to reach.  Do not use any sheets or blankets that are too big for your bed. They should not hang down onto the floor.  Have a firm chair that has side arms. You can use this for support while you get dressed.  Do not have throw rugs and other things on the floor that can make you trip. What can I do in the kitchen?  Clean up any spills right away.  Avoid walking on wet floors.  Keep items that you use a lot in easy-to-reach places.  If you need to reach something above you, use a strong step stool that has a grab bar.  Keep electrical cords out of the way.  Do not use floor polish or wax that makes floors slippery. If you must use wax, use non-skid floor wax.  Do not have throw rugs and other things on the floor that can make you trip. What can I do with my stairs?  Do not leave any  items on the stairs.  Make sure that there are handrails on both sides of the stairs and use them. Fix handrails that are broken or loose. Make sure that handrails are as long as the stairways.  Check any carpeting to make sure that it is firmly attached to the stairs. Fix any carpet that is loose or worn.  Avoid having throw rugs at the top or bottom of the stairs. If you do have throw rugs, attach them to the floor with carpet tape.  Make sure that you have a light switch at the top of the stairs and the bottom of the stairs. If you do not have them, ask someone to add them for you. What else can I do to help prevent falls?  Wear shoes that:  Do not have high heels.  Have rubber bottoms.  Are comfortable and fit you well.  Are closed at the toe. Do not wear sandals.  If you use a stepladder:  Make sure that it is fully opened. Do not climb a closed stepladder.  Make sure that both sides of the stepladder are locked into place.  Ask someone to hold it for you,  if possible.  Clearly mark and make sure that you can see:  Any grab bars or handrails.  First and last steps.  Where the edge of each step is.  Use tools that help you move around (mobility aids) if they are needed. These include:  Canes.  Walkers.  Scooters.  Crutches.  Turn on the lights when you go into a dark area. Replace any light bulbs as soon as they burn out.  Set up your furniture so you have a clear path. Avoid moving your furniture around.  If any of your floors are uneven, fix them.  If there are any pets around you, be aware of where they are.  Review your medicines with your doctor. Some medicines can make you feel dizzy. This can increase your chance of falling. Ask your doctor what other things that you can do to help prevent falls. This information is not intended to replace advice given to you by your health care provider. Make sure you discuss any questions you have with your health care provider. Document Released: 09/03/2009 Document Revised: 04/14/2016 Document Reviewed: 12/12/2014 Elsevier Interactive Patient Education  2017 [employer].

## 2017-08-15 LAB — LIPID PANEL
Cholesterol: 146 (ref 0–200)
HDL: 74 — AB (ref 35–70)
LDL Cholesterol: 57
Triglycerides: 74 (ref 40–160)

## 2017-09-13 ENCOUNTER — Non-Acute Institutional Stay: Payer: Medicare Other | Admitting: Nurse Practitioner

## 2017-09-13 ENCOUNTER — Encounter: Payer: Self-pay | Admitting: Nurse Practitioner

## 2017-09-13 DIAGNOSIS — R269 Unspecified abnormalities of gait and mobility: Secondary | ICD-10-CM

## 2017-09-13 DIAGNOSIS — M25562 Pain in left knee: Secondary | ICD-10-CM | POA: Diagnosis not present

## 2017-09-13 NOTE — Assessment & Plan Note (Signed)
Left medial lower knee pain, positional and related to weight bearing, popliteal fullness noted, will US to rule out Baker's cyst, X-ray AP and lateral views to evaluate the left knee, BioFreeze qid to left knee, PT to evaluate and treat if indicated.

## 2017-09-13 NOTE — Progress Notes (Signed)
Location:  [facility] Room Number: 822 Place of Service:  ALF [phone]) Provider:  Avant Printy, Manxie  NP  Oneal GroutPandey, Mahima, MD  Patient Care Team: Oneal GroutPandey, Mahima, MD as PCP - General (Internal Medicine) Venita LickBrooks, Dahari, MD as Consulting Physician (Orthopedic Surgery) Marykay LexHarding, David W, MD as Consulting Physician (Cardiology) Rahma Meller X, NP as Nurse Practitioner (Internal Medicine)  Extended Emergency Contact Information Primary Emergency Contact: [name],Bette [place] Home Phone: [phone] Mobile Phone: [phone] Relation: Niece Secondary Emergency Contact: [name],[name]  [place] Mobile Phone: [phone] Relation: Friend  Code Status:  DNR Goals of care: Advanced Directive information Advanced Directives 09/13/2017  Does Patient Have a Medical Advance Directive? Yes  Type of Advance Directive Out of facility DNR (pink MOST or yellow form)  Does patient want to make changes to medical advance directive? No - Patient declined  Copy of Healthcare Power of Attorney in Chart? -  Would patient like information on creating a medical advance directive? -  Pre-existing out of facility DNR order (yellow form or pink MOST form) Yellow form placed in chart (order not valid for inpatient use)     Chief Complaint  Patient presents with  . Acute Visit    (L) knee pain    HPI:  Pt is a 83 y.o. female seen today for an acute visit for c/o left knee pain x 2 weeks, on and off, medial lower aspect of the left knee,  usually occurs when waking, now she uses walker more than prior, she relieves pain when sitting/lying down. Tylenol has little effects. No swelling, fever, or decreased ROM of the left knee noted. Her right leg is shorter than the left, her gait has been limping.    Past Medical History:  Diagnosis Date  . Arteriosclerotic cardiovascular disease   . Bee sting reaction 08/23/2016   Tongue swelling and difficulty  swallowing /notes 08/23/2016  . Constipation   . Degenerative joint disease of right hip 10/15/14  . Dysrhythmia    Paroxysmal Atrial Fibrillation  . Fracture of multiple pubic rami (HCC) 10/15/14   Right inferior and superior pubic rami  . HOH (hard of hearing)    Wears bilateral hearing aids  . Hyperlipidemia   . Hypertension   . Macular degeneration   . Memory deficit   . Osteoporosis   . Pain in right foot   . Palpitations   . Paroxysmal atrial fibrillation (HCC)   . Thyroid nodule    Status post surgery  . Ulcer of hard palate   . Vertigo    Past Surgical History:  Procedure Laterality Date  . ABDOMINAL HYSTERECTOMY  1968  . APPENDECTOMY  1940's  . COLONOSCOPY    . FOOT SURGERY Left   . HARDWARE REMOVAL Right 02/16/2016   Procedure: HARDWARE REMOVAL RT HIP;  Surgeon: Sheral Apleyimothy D Murphy, MD;  [facility];  Service: Orthopedics;  Laterality: Right;  . HIP ARTHROPLASTY Right 02/16/2016   Procedure: ARTHROPLASTY BIPOLAR HIP (HEMIARTHROPLASTY);  Surgeon: Sheral Apleyimothy D Murphy, MD;  [facility];  Service: Orthopedics;  Laterality: Right;  . INTRAMEDULLARY (IM) NAIL INTERTROCHANTERIC Right 09/11/2015   Procedure: INTRAMEDULLARY (IM) NAIL INTERTROCHANTRIC;  Surgeon: Sheral Apleyimothy D Murphy, MD;  Location: [facility] OR;  Service: Orthopedics;  Laterality: Right;  . KNEE SURGERY      Allergies  Allergen Reactions  . Benadryl [Diphenhydramine] Other (See Comments)    Muscular degeneration  . Penicillins Other (See Comments)    Has patient had  a PCN reaction causing immediate rash, facial/tongue/throat swelling, SOB or lightheadedness with hypotension: Unknown Has patient had a PCN reaction causing severe rash involving mucus membranes or skin necrosis: Unknown Has patient had a PCN reaction that required hospitalization: Unknown Has patient had a PCN reaction occurring within the last 10 years: No If all of the above answers are "NO", then may proceed with Cephalosporin use.      Outpatient Encounter Prescriptions as of 09/13/2017  Medication Sig  . acetaminophen (TYLENOL) 500 MG tablet Take 500 mg by mouth every 6 (six) hours as needed (for pain, headaches, or fever).   Marland Kitchen amLODipine (NORVASC) 2.5 MG tablet Take 2.5 mg by mouth daily.  Marland Kitchen aspirin 325 MG tablet Take 325 mg by mouth daily.  Marland Kitchen atorvastatin (LIPITOR) 20 MG tablet Take 20 mg by mouth daily.  . Biotin 16109 MCG TABS Take 1 tablet by mouth daily.  . Cholecalciferol (VITAMIN D) 2000 units tablet One daily for Vitamin D supplement  . docusate sodium (COLACE) 100 MG capsule Take 100 mg by mouth 2 (two) times daily.   Marland Kitchen EPINEPHrine 0.3 mg/0.3 mL IJ SOAJ injection Inject 0.3 mLs (0.3 mg total) into the muscle once as needed (anaphylaxis/allergic reaction).  . ferrous gluconate (FERGON) 324 MG tablet Take 1 tablet (324 mg total) by mouth daily with breakfast.  . meclizine (ANTIVERT) 12.5 MG tablet Take 12.5 mg by mouth 2 (two) times daily as needed for dizziness (in addition to the scheduled dose.).  Marland Kitchen Melatonin 3 MG TABS Take 3 mg by mouth at bedtime as needed (for sleep).  . Multiple Vitamin (MULTIVITAMIN WITH MINERALS) TABS Take 1 tablet by mouth daily.  . Multiple Vitamins-Minerals (PRESERVISION AREDS 2 PO) Take 2 capsules by mouth daily.  . polyethylene glycol (MIRALAX / GLYCOLAX) packet Take 17 g by mouth daily as needed (for constipation). MIX IN 4 OUNCES OF FLUID  . [DISCONTINUED] meclizine (ANTIVERT) 12.5 MG tablet Take 12.5 mg by mouth daily.    No facility-administered encounter medications on file as of 09/13/2017.     Review of Systems  Constitutional: Negative for activity change, appetite change, chills, diaphoresis, fatigue and fever.  Musculoskeletal: Positive for arthralgias and gait problem. Negative for back pain, joint swelling and myalgias.       Left knee pain, walking with walker more than prior  Skin: Negative for color change, pallor, rash and wound.  Neurological: Negative for  speech difficulty, weakness and headaches.    Immunization History  Administered Date(s) Administered  . Influenza-Unspecified 09/07/2016, 09/12/2017  . PPD Test 02/18/2016  . Tdap 10/07/2012   Pertinent  Health Maintenance Due  Topic Date Due  . INFLUENZA VACCINE  06/21/2017  . PNA vac Low Risk Adult (1 of 2 - PCV13) 11/21/2017 (Originally 04/15/1989)  . DEXA SCAN  Completed   Fall Risk  08/03/2017 10/24/2014  Falls in the past year? No Yes  Number falls in past yr: - 1  Injury with Fall? - Yes  Risk Factor Category  - High Fall Risk  Risk for fall due to : - History of fall(s)   Functional Status Survey:    Vitals:   09/13/17 1201  BP: 130/60  Pulse: 66  Resp: 18  Temp: 97.7 F (36.5 C)  SpO2: 96%  Weight: 124 lb (56.2 kg)  Height: 5\' 1"  (1.549 m)   Body mass index is 23.43 kg/m. Physical Exam  Constitutional: She is oriented to person, place, and time. She appears well-developed and well-nourished. No  distress.  Musculoskeletal: Normal range of motion. She exhibits tenderness. She exhibits no edema.  The right leg is shorter historically. Limping gait, ambulates with walker, pain palpated the medial left lower aspect of the knee, no decreased ROM, no warmth, no crepitus, no balllotement. Fullness felt in the left popliteal area.   Neurological: She is alert and oriented to person, place, and time. She has normal reflexes. She exhibits normal muscle tone. Coordination normal.  Skin: Skin is warm and dry. No rash noted. She is not diaphoretic. No erythema. No pallor.    Labs reviewed:  Recent Labs  12/22/16 05/11/17  NA 138 136*  K 4.5 4.3  BUN 17 11  CREATININE 0.8 0.7    Recent Labs  12/22/16 05/11/17  AST 22 27  ALT 15 19  ALKPHOS 59 48    Recent Labs  12/22/16 05/11/17  WBC 3.9 4.3  HGB 11.6* 11.7*  HCT 34* 34*  PLT 254 235   Lab Results  Component Value Date   TSH 1.69 06/27/2017   No results found for: HGBA1C Lab Results  Component Value  Date   CHOL 146 08/15/2017   HDL 74 (A) 08/15/2017   LDLCALC 57 08/15/2017   TRIG 74 08/15/2017    Significant Diagnostic Results in last 30 days:  No results found.  Assessment/Plan Left medial knee pain Left medial lower knee pain, positional and related to weight bearing, popliteal fullness noted, will [place] to rule out Baker's cyst, X-ray AP and lateral views to evaluate the left knee, BioFreeze qid to left knee, PT to evaluate and treat if indicated.   Gait abnormality Her right leg is shorter than the left, her gait has been limping. This may contribute to her left knee pain from the left leg compensating the right leg when walking. PT to eval and treat as indicated, continue walker for ambulation for pain and safety.       Family/ staff Communication: plan of care reviewed with the patient and charge nurse.   Labs/tests ordered:  [place] popliteal area, X-ray AP and Lateral views of the left knee  Time spend 25 minutes.

## 2017-09-13 NOTE — Assessment & Plan Note (Signed)
Her right leg is shorter than the left, her gait has been limping. This may contribute to her left knee pain from the left leg compensating the right leg when walking. PT to eval and treat as indicated, continue walker for ambulation for pain and safety.

## 2017-09-29 ENCOUNTER — Encounter: Payer: Self-pay | Admitting: Nurse Practitioner

## 2017-09-29 ENCOUNTER — Non-Acute Institutional Stay: Payer: Medicare Other | Admitting: Nurse Practitioner

## 2017-09-29 DIAGNOSIS — D509 Iron deficiency anemia, unspecified: Secondary | ICD-10-CM | POA: Diagnosis not present

## 2017-09-29 DIAGNOSIS — E782 Mixed hyperlipidemia: Secondary | ICD-10-CM

## 2017-09-29 DIAGNOSIS — I10 Essential (primary) hypertension: Secondary | ICD-10-CM

## 2017-09-29 DIAGNOSIS — M25562 Pain in left knee: Secondary | ICD-10-CM

## 2017-09-29 DIAGNOSIS — I480 Paroxysmal atrial fibrillation: Secondary | ICD-10-CM | POA: Diagnosis not present

## 2017-09-29 NOTE — Assessment & Plan Note (Addendum)
09/13/17 X-ray no acute fracture or dislocation, no joint effusion, meniscal calcification compatible with chondrocalcinosis 09/15/17 US no evidence of Baker's cyst.  Much better since Biofreeze to the area and Physical therapy. She walks/transfers independently.

## 2017-09-29 NOTE — Assessment & Plan Note (Signed)
Her blood pressure is controlled on Amlodipine 2.5mg  daily,

## 2017-09-29 NOTE — Assessment & Plan Note (Signed)
aking Fe 325mg  daily with breakfast, last Hgb 11.7 05/11/17. Update CBC

## 2017-09-29 NOTE — Progress Notes (Signed)
Location:  [facility] Room Number: 822 Place of Service:  ALF [phone]) Provider:  Mast, Manxie  NP  Oneal GroutPandey, Mahima, MD  Patient Care Team: Oneal GroutPandey, Mahima, MD as PCP - General (Internal Medicine) Venita LickBrooks, Dahari, MD as Consulting Physician (Orthopedic Surgery) Marykay LexHarding, David W, MD as Consulting Physician (Cardiology) Mast, Man X, NP as Nurse Practitioner (Internal Medicine)  Extended Emergency Contact Information Primary Emergency Contact: [name],[place]anna [place] Home Phone: [phone] Mobile Phone: [phone] Relation: Niece Secondary Emergency Contact: [name],[name]  [place] Mobile Phone: [phone] Relation: Friend  Code Status:  DNR Goals of care: Advanced Directive information Advanced Directives 09/29/2017  Does Patient Have a Medical Advance Directive? Yes  Type of Advance Directive Out of facility DNR (pink MOST or yellow form)  Does patient want to make changes to medical advance directive? No - Patient declined  Copy of Healthcare Power of Attorney in Chart? -  Would patient like information on creating a medical advance directive? No - Patient declined  Pre-existing out of facility DNR order (yellow form or pink MOST form) Yellow form placed in chart (order not valid for inpatient use)     Chief Complaint  Patient presents with  . Medical Management of Chronic Issues    F/u - (L) knee pain    HPI:  Pt is a 83 y.o. female seen today for medical management of chronic diseases.     Past Medical History:  Diagnosis Date  . Arteriosclerotic cardiovascular disease   . Bee sting reaction 08/23/2016   Tongue swelling and difficulty swallowing /notes 08/23/2016  . Constipation   . Degenerative joint disease of right hip 10/15/14  . Dysrhythmia    Paroxysmal Atrial Fibrillation  . Fracture of multiple pubic rami (HCC) 10/15/14   Right inferior and superior pubic rami  . HOH (hard of hearing)    Wears  bilateral hearing aids  . Hyperlipidemia   . Hypertension   . Macular degeneration   . Memory deficit   . Osteoporosis   . Pain in right foot   . Palpitations   . Paroxysmal atrial fibrillation (HCC)   . Thyroid nodule    Status post surgery  . Ulcer of hard palate   . Vertigo    Past Surgical History:  Procedure Laterality Date  . ABDOMINAL HYSTERECTOMY  1968  . APPENDECTOMY  1940's  . COLONOSCOPY    . FOOT SURGERY Left   . KNEE SURGERY      Allergies  Allergen Reactions  . Benadryl [Diphenhydramine] Other (See Comments)    Muscular degeneration  . Penicillins Other (See Comments)    Has patient had a PCN reaction causing immediate rash, facial/tongue/throat swelling, SOB or lightheadedness with hypotension: Unknown Has patient had a PCN reaction causing severe rash involving mucus membranes or skin necrosis: Unknown Has patient had a PCN reaction that required hospitalization: Unknown Has patient had a PCN reaction occurring within the last 10 years: No If all of the above answers are "NO", then may proceed with Cephalosporin use.     Outpatient Encounter Medications as of 09/29/2017  Medication Sig  . acetaminophen (TYLENOL) 500 MG tablet Take 500 mg by mouth every 6 (six) hours as needed (for pain, headaches, or fever).   Marland Kitchen. amLODipine (NORVASC) 2.5 MG tablet Take 2.5 mg by mouth daily.  Marland Kitchen. aspirin 325 MG tablet Take 325 mg by mouth daily.  Marland Kitchen. atorvastatin (LIPITOR) 20 MG tablet Take 20 mg by mouth daily.  .Marland Kitchen  Biotin 1610910000 MCG TABS Take 1 tablet by mouth daily.  . Cholecalciferol (VITAMIN D) 2000 units tablet One daily for Vitamin D supplement  . docusate sodium (COLACE) 100 MG capsule Take 100 mg by mouth 2 (two) times daily.   Marland Kitchen. EPINEPHrine 0.3 mg/0.3 mL IJ SOAJ injection Inject 0.3 mLs (0.3 mg total) into the muscle once as needed (anaphylaxis/allergic reaction).  . ferrous gluconate (FERGON) 324 MG tablet Take 1 tablet (324 mg total) by mouth daily with breakfast.  .  meclizine (ANTIVERT) 12.5 MG tablet Take 12.5 mg by mouth 2 (two) times daily as needed for dizziness (in addition to the scheduled dose.).  Marland Kitchen. Melatonin 3 MG TABS Take 3 mg by mouth at bedtime as needed (for sleep).  . Multiple Vitamin (MULTIVITAMIN WITH MINERALS) TABS Take 1 tablet by mouth daily.  . Multiple Vitamins-Minerals (PRESERVISION AREDS 2 PO) Take 2 capsules by mouth daily.  . polyethylene glycol (MIRALAX / GLYCOLAX) packet Take 17 g by mouth daily as needed (for constipation). MIX IN 4 OUNCES OF FLUID   No facility-administered encounter medications on file as of 09/29/2017.     Review of Systems  Immunization History  Administered Date(s) Administered  . Influenza-Unspecified 09/07/2016, 09/12/2017  . PPD Test 02/18/2016  . Tdap 10/07/2012   Pertinent  Health Maintenance Due  Topic Date Due  . PNA vac Low Risk Adult (1 of 2 - PCV13) 11/21/2017 (Originally 04/15/1989)  . INFLUENZA VACCINE  Completed  . DEXA SCAN  Completed   Fall Risk  08/03/2017 10/24/2014  Falls in the past year? No Yes  Number falls in past yr: - 1  Injury with Fall? - Yes  Risk Factor Category  - High Fall Risk  Risk for fall due to : - History of fall(s)   Functional Status Survey:    Vitals:   09/29/17 1236  BP: 124/68  Pulse: 68  Resp: 18  Temp: 98.2 F (36.8 C)  Weight: 125 lb 6.4 oz (56.9 kg)  Height: 5\' 1"  (1.549 m)   Body mass index is 23.69 kg/m. Physical Exam  Labs reviewed: Recent Labs    12/22/16 05/11/17  NA 138 136*  K 4.5 4.3  BUN 17 11  CREATININE 0.8 0.7   Recent Labs    12/22/16 05/11/17  AST 22 27  ALT 15 19  ALKPHOS 59 48   Recent Labs    12/22/16 05/11/17  WBC 3.9 4.3  HGB 11.6* 11.7*  HCT 34* 34*  PLT 254 235   Lab Results  Component Value Date   TSH 1.69 06/27/2017   No results found for: HGBA1C Lab Results  Component Value Date   CHOL 146 08/15/2017   HDL 74 (A) 08/15/2017   LDLCALC 57 08/15/2017   TRIG 74 08/15/2017    Significant  Diagnostic Results in last 30 days:  No results found.  Assessment/Plan There are no diagnoses linked to this encounter.   Family/ staff Communication:   Labs/tests ordered:

## 2017-09-29 NOTE — Assessment & Plan Note (Signed)
She takes  Atorvastatin 20mg  qd for cholesterol control, last cholesterol 146, triglycerides 74, HDL 74, LDL 57, will decrease Atorvastatin to 10mg  po qd.

## 2017-09-29 NOTE — Assessment & Plan Note (Signed)
Continue ASA 325mg  qd for thromboembolic risk reduction in setting of PAF, her heart rate is in control.

## 2017-09-29 NOTE — Progress Notes (Signed)
Location:   [facility] Room Number: 822 Place of Service: [facility] Provider:  Arna Snipe Tallie Hevia NP  Oneal Grout, MD  Patient Care Team: Oneal Grout, MD as PCP - General (Internal Medicine) Venita Lick, MD as Consulting Physician (Orthopedic Surgery) Marykay Lex, MD as Consulting Physician (Cardiology) Otie Headlee X, NP as Nurse Practitioner (Internal Medicine)  Extended Emergency Contact Information Primary Emergency Contact: [name],Marijo CT [place] Home Phone: [phone] Mobile Phone: [phone] Relation: Niece Secondary Emergency Contact: [name],[name]  [place] Mobile Phone: [phone] Relation: Friend  Code Status:  DNR Goals of care: Advanced Directive information Advanced Directives 09/29/2017  Does Patient Have a Medical Advance Directive? Yes  Type of Advance Directive Out of facility DNR (pink MOST or yellow form)  Does patient want to make changes to medical advance directive? No - Patient declined  Copy of Healthcare Power of Attorney in Chart? -  Would patient like information on creating a medical advance directive? No - Patient declined  Pre-existing out of facility DNR order (yellow form or pink MOST form) Yellow form placed in chart (order not valid for inpatient use)     Chief Complaint  Patient presents with  . Medical Management of Chronic Issues    F/u - (L) knee pain    HPI:  Pt is a 83 y.o. female seen today for medical management of chronic diseases.     The patient has history of anemia, taking Fe 325mg  daily with breakfast, last Hgb 11.7 05/11/17. Her blood pressure is controlled on Amlodipine 2.5mg  daily, takes ASA 325mg  qd for thromboembolic risk reduction in setting of PAF, her heart rate is in control. She takes  Atorvastatin 20mg  qd for cholesterol control, last cholesterol 146, triglycerides 74, HDL 74, LDL 57. Left knee pain, stable, taking BioFreeze qid since 09/13/17   Past Medical  History:  Diagnosis Date  . Arteriosclerotic cardiovascular disease   . Bee sting reaction 08/23/2016   Tongue swelling and difficulty swallowing /notes 08/23/2016  . Constipation   . Degenerative joint disease of right hip 10/15/14  . Dysrhythmia    Paroxysmal Atrial Fibrillation  . Fracture of multiple pubic rami (HCC) 10/15/14   Right inferior and superior pubic rami  . HOH (hard of hearing)    Wears bilateral hearing aids  . Hyperlipidemia   . Hypertension   . Macular degeneration   . Memory deficit   . Osteoporosis   . Pain in right foot   . Palpitations   . Paroxysmal atrial fibrillation (HCC)   . Thyroid nodule    Status post surgery  . Ulcer of hard palate   . Vertigo    Past Surgical History:  Procedure Laterality Date  . ABDOMINAL HYSTERECTOMY  1968  . APPENDECTOMY  1940's  . COLONOSCOPY    . FOOT SURGERY Left   . KNEE SURGERY      Allergies  Allergen Reactions  . Benadryl [Diphenhydramine] Other (See Comments)    Muscular degeneration  . Penicillins Other (See Comments)    Has patient had a PCN reaction causing immediate rash, facial/tongue/throat swelling, SOB or lightheadedness with hypotension: Unknown Has patient had a PCN reaction causing severe rash involving mucus membranes or skin necrosis: Unknown Has patient had a PCN reaction that required hospitalization: Unknown Has patient had a PCN reaction occurring within the last 10 years: No If all of the above answers are "NO", then may proceed with Cephalosporin use.     Allergies  as of 09/29/2017      Reactions   Benadryl [diphenhydramine] Other (See Comments)   Muscular degeneration   Penicillins Other (See Comments)   Has patient had a PCN reaction causing immediate rash, facial/tongue/throat swelling, SOB or lightheadedness with hypotension: Unknown Has patient had a PCN reaction causing severe rash involving mucus membranes or skin necrosis: Unknown Has patient had a PCN reaction that required  hospitalization: Unknown Has patient had a PCN reaction occurring within the last 10 years: No If all of the above answers are "NO", then may proceed with Cephalosporin use.      Medication List        Accurate as of 09/29/17 11:59 PM. Always use your most recent med list.          acetaminophen 500 MG tablet Commonly known as:  TYLENOL Take 500 mg by mouth every 6 (six) hours as needed (for pain, headaches, or fever).   amLODipine 2.5 MG tablet Commonly known as:  NORVASC Take 2.5 mg by mouth daily.   aspirin 325 MG tablet Take 325 mg by mouth daily.   atorvastatin 20 MG tablet Commonly known as:  LIPITOR Take 20 mg by mouth daily.   Biotin [address] MCG Tabs Take 1 tablet by mouth daily.   docusate sodium 100 MG capsule Commonly known as:  COLACE Take 100 mg by mouth 2 (two) times daily.   EPINEPHrine 0.3 mg/0.3 mL Soaj injection Commonly known as:  EPI-PEN Inject 0.3 mLs (0.3 mg total) into the muscle once as needed (anaphylaxis/allergic reaction).   ferrous gluconate 324 MG tablet Commonly known as:  FERGON Take 1 tablet (324 mg total) by mouth daily with breakfast.   meclizine 12.5 MG tablet Commonly known as:  ANTIVERT Take 12.5 mg by mouth 2 (two) times daily as needed for dizziness (in addition to the scheduled dose.).   Melatonin 3 MG Tabs Take 3 mg by mouth at bedtime as needed (for sleep).   multivitamin with minerals Tabs tablet Take 1 tablet by mouth daily.   polyethylene glycol packet Commonly known as:  MIRALAX / GLYCOLAX Take 17 g by mouth daily as needed (for constipation). MIX IN 4 OUNCES OF FLUID   PRESERVISION AREDS 2 PO Take 2 capsules by mouth daily.   Vitamin D 2000 units tablet One daily for Vitamin D supplement       Review of Systems  Constitutional: Negative for activity change, appetite change, chills, diaphoresis, fatigue and fever.  HENT: Positive for hearing loss. Negative for congestion, trouble swallowing and voice  change.   Eyes: Positive for visual disturbance.       Macular degeneration, R>L, seeing Ophthalmology every 7 weeks.    Respiratory: Negative for cough, choking, chest tightness, shortness of breath and wheezing.   Cardiovascular: Negative for chest pain, palpitations and leg swelling.  Gastrointestinal: Negative for abdominal distention, abdominal pain, constipation, diarrhea, nausea and vomiting.  Endocrine: Negative for cold intolerance.  Genitourinary: Negative for difficulty urinating, dysuria and urgency.  Musculoskeletal: Positive for arthralgias. Negative for back pain, gait problem, joint swelling, myalgias, neck pain and neck stiffness.  Skin: Negative for color change, pallor, rash and wound.  Neurological: Positive for dizziness. Negative for tremors, speech difficulty, weakness and headaches.       Sometime upon arising in morning, but not severe enough to take Meclizine like in the past.    Psychiatric/Behavioral: Negative for agitation, behavioral problems and confusion. The patient is not nervous/anxious.     Immunization History  Administered Date(s) Administered  . Influenza-Unspecified 09/07/2016, 09/12/2017  . PPD Test 02/18/2016  . Tdap 10/07/2012   Pertinent  Health Maintenance Due  Topic Date Due  . PNA vac Low Risk Adult (1 of 2 - PCV13) 11/21/2017 (Originally 04/15/1989)  . INFLUENZA VACCINE  Completed  . DEXA SCAN  Completed   Fall Risk  08/03/2017 10/24/2014  Falls in the past year? No Yes  Number falls in past yr: - 1  Injury with Fall? - Yes  Risk Factor Category  - High Fall Risk  Risk for fall due to : - History of fall(s)   Functional Status Survey:    Vitals:   09/29/17 1236  BP: 124/68  Pulse: 68  Resp: 18  Temp: 98.2 F (36.8 C)  Weight: 125 lb 6.4 oz (56.9 kg)  Height: 5\' 1"  (1.549 m)   Body mass index is 23.69 kg/m. Physical Exam  Constitutional: She is oriented to person, place, and time. She appears well-developed and  well-nourished. No distress.  HENT:  Head: Normocephalic and atraumatic.  Eyes: Conjunctivae and EOM are normal. Pupils are equal, round, and reactive to light.  Neck: Normal range of motion. Neck supple. No JVD present. No thyromegaly present.  Cardiovascular: Normal rate, regular rhythm and normal heart sounds.  No murmur heard. Pulmonary/Chest: Effort normal and breath sounds normal. She has no wheezes. She has no rales.  Abdominal: Soft. Bowel sounds are normal. She exhibits no distension. There is no tenderness.  Musculoskeletal: Normal range of motion. She exhibits no edema or tenderness.  No pain palpated in medial left knee  Neurological: She is alert and oriented to person, place, and time. She has normal reflexes. She exhibits normal muscle tone. Coordination normal.  Skin: Skin is warm and dry. She is not diaphoretic.  Psychiatric: She has a normal mood and affect. Her behavior is normal. Judgment and thought content normal.    Labs reviewed: Recent Labs    12/22/16 05/11/17  NA 138 136*  K 4.5 4.3  BUN 17 11  CREATININE 0.8 0.7   Recent Labs    12/22/16 05/11/17  AST 22 27  ALT 15 19  ALKPHOS 59 48   Recent Labs    12/22/16 05/11/17  WBC 3.9 4.3  HGB 11.6* 11.7*  HCT 34* 34*  PLT 254 235   Lab Results  Component Value Date   TSH 1.69 06/27/2017   No results found for: HGBA1C Lab Results  Component Value Date   CHOL 146 08/15/2017   HDL 74 (A) 08/15/2017   LDLCALC 57 08/15/2017   TRIG 74 08/15/2017    Significant Diagnostic Results in last 30 days:  No results found.  Assessment/Plan  Hyperlipidemia She takes  Atorvastatin 20mg  qd for cholesterol control, last cholesterol 146, triglycerides 74, HDL 74, LDL 57, will decrease Atorvastatin to 10mg  po qd.   Anemia, iron deficiency aking Fe 325mg  daily with breakfast, last Hgb 11.7 05/11/17. Update CBC  Essential hypertension Her blood pressure is controlled on Amlodipine 2.5mg  daily,  PAF  (paroxysmal atrial fibrillation) (HCC) Continue ASA 325mg  qd for thromboembolic risk reduction in setting of PAF, her heart rate is in control.   Left medial knee pain 09/13/17 X-ray no acute fracture or dislocation, no joint effusion, meniscal calcification compatible with chondrocalcinosis 09/15/17 US no evidence of Baker's cyst.  Much better since Biofreeze to the area and Physical therapy. She walks/transfers independently.    Family/ staff Communication: plan of care reviewed with the patient and  charge nurse.   Labs/tests ordered: CBC   Time spend 25 minutes.

## 2017-10-03 LAB — CBC AND DIFFERENTIAL
HCT: 33 — AB (ref 36–46)
HEMOGLOBIN: 11.8 — AB (ref 12.0–16.0)
Platelets: 273 (ref 150–399)
WBC: 4.4

## 2017-11-16 ENCOUNTER — Non-Acute Institutional Stay: Payer: Medicare Other | Admitting: Internal Medicine

## 2017-11-16 ENCOUNTER — Encounter: Payer: Self-pay | Admitting: Internal Medicine

## 2017-11-16 DIAGNOSIS — K5900 Constipation, unspecified: Secondary | ICD-10-CM

## 2017-11-16 DIAGNOSIS — G4700 Insomnia, unspecified: Secondary | ICD-10-CM

## 2017-11-16 DIAGNOSIS — I10 Essential (primary) hypertension: Secondary | ICD-10-CM | POA: Diagnosis not present

## 2017-11-16 DIAGNOSIS — H8113 Benign paroxysmal vertigo, bilateral: Secondary | ICD-10-CM

## 2017-11-16 NOTE — Progress Notes (Signed)
Location:    Nursing Home Room Number: 822 Place of Service:  [facility] (13) Provider:  Oneal GroutMahima Mea Ozga, MD  Oneal GroutPandey, Joshwa Hemric, MD  Patient Care Team: Oneal GroutPandey, Faviola Klare, MD as PCP - General (Internal Medicine) Venita LickBrooks, Dahari, MD as Consulting Physician (Orthopedic Surgery) Marykay LexHarding, David W, MD as Consulting Physician (Cardiology) Mast, Man X, NP as Nurse Practitioner (Internal Medicine)  Extended Emergency Contact Information Primary Emergency Contact: [name],Janith [place] Home Phone: [phone] Mobile Phone: [phone] Relation: Niece Secondary Emergency Contact: [name],[name]  [place] Mobile Phone: [phone] Relation: Friend  Code Status:  DNR  Goals of care: Advanced Directive information Advanced Directives 11/16/2017  Does Patient Have a Medical Advance Directive? Yes  Type of Advance Directive Out of facility DNR (pink MOST or yellow form)  Does patient want to make changes to medical advance directive? No - Patient declined  Copy of Healthcare Power of Attorney in Chart? -  Would patient like information on creating a medical advance directive? -  Pre-existing out of facility DNR order (yellow form or pink MOST form) Yellow form placed in chart (order not valid for inpatient use)     Chief Complaint  Patient presents with  . Acute Visit    dizziness, sleep problem, constipation    HPI:  Pt is a 83 y.o. female seen today for an acute visit for follow up on her dizziness. She still has occasional dizziness. Her exercises and slow position change helps her along with her medication. Has not required the prn dosing. She does not want to change her standing order of meclizine. Denies dry mouth. No trouble with urination. She gets constipated at times and colace with prune juice helps. Has not required her prn miralax. Sleeping well. Has not used her prn melatonin. She is on amlodipine 2.5 mg daily for HTN. BP readings reviewed. Ranges  102-130/58-70.   Past Medical History:  Diagnosis Date  . Arteriosclerotic cardiovascular disease   . Bee sting reaction 08/23/2016   Tongue swelling and difficulty swallowing /notes 08/23/2016  . Constipation   . Degenerative joint disease of right hip 10/15/14  . Dysrhythmia    Paroxysmal Atrial Fibrillation  . Fracture of multiple pubic rami (HCC) 10/15/14   Right inferior and superior pubic rami  . HOH (hard of hearing)    Wears bilateral hearing aids  . Hyperlipidemia   . Hypertension   . Macular degeneration   . Memory deficit   . Osteoporosis   . Pain in right foot   . Palpitations   . Paroxysmal atrial fibrillation (HCC)   . Thyroid nodule    Status post surgery  . Ulcer of hard palate   . Vertigo    Past Surgical History:  Procedure Laterality Date  . ABDOMINAL HYSTERECTOMY  1968  . APPENDECTOMY  1940's  . COLONOSCOPY    . FOOT SURGERY Left   . HARDWARE REMOVAL Right 02/16/2016   Procedure: HARDWARE REMOVAL RT HIP;  Surgeon: Sheral Apleyimothy D Murphy, MD;  [facility];  Service: Orthopedics;  Laterality: Right;  . HIP ARTHROPLASTY Right 02/16/2016   Procedure: ARTHROPLASTY BIPOLAR HIP (HEMIARTHROPLASTY);  Surgeon: Sheral Apleyimothy D Murphy, MD;  [facility];  Service: Orthopedics;  Laterality: Right;  . INTRAMEDULLARY (IM) NAIL INTERTROCHANTERIC Right 09/11/2015   Procedure: INTRAMEDULLARY (IM) NAIL INTERTROCHANTRIC;  Surgeon: Sheral Apleyimothy D Murphy, MD;  Location: [facility] OR;  Service: Orthopedics;  Laterality: Right;  . KNEE SURGERY      Allergies  Allergen Reactions  . Benadryl [  Diphenhydramine] Other (See Comments)    Muscular degeneration  . Penicillins Other (See Comments)    Has patient had a PCN reaction causing immediate rash, facial/tongue/throat swelling, SOB or lightheadedness with hypotension: Unknown Has patient had a PCN reaction causing severe rash involving mucus membranes or skin necrosis: Unknown Has patient had a PCN reaction that required hospitalization:  Unknown Has patient had a PCN reaction occurring within the last 10 years: No If all of the above answers are "NO", then may proceed with Cephalosporin use.     Outpatient Encounter Medications as of 11/16/2017  Medication Sig  . acetaminophen (TYLENOL) 500 MG tablet Take 500 mg by mouth every 6 (six) hours as needed (for pain, headaches, or fever).   Marland Kitchen. amLODipine (NORVASC) 2.5 MG tablet Take 2.5 mg by mouth daily.  Marland Kitchen. aspirin 325 MG tablet Take 325 mg by mouth daily.  Marland Kitchen. atorvastatin (LIPITOR) 10 MG tablet Take 10 mg by mouth daily.  . Biotin 1610910000 MCG TABS Take 1 tablet by mouth daily.  . Cholecalciferol (VITAMIN D) 2000 units tablet One daily for Vitamin D supplement  . docusate sodium (COLACE) 100 MG capsule Take 100 mg by mouth 2 (two) times daily.   Marland Kitchen. EPINEPHrine 0.3 mg/0.3 mL IJ SOAJ injection Inject 0.3 mLs (0.3 mg total) into the muscle once as needed (anaphylaxis/allergic reaction).  . ferrous gluconate (FERGON) 324 MG tablet Take 1 tablet (324 mg total) by mouth daily with breakfast.  . meclizine (ANTIVERT) 12.5 MG tablet Take 12.5 mg by mouth 2 (two) times daily as needed for dizziness (in addition to the scheduled dose.).  Marland Kitchen. meclizine (ANTIVERT) 12.5 MG tablet Take 12.5 mg by mouth daily.  . Melatonin 3 MG TABS Take 3 mg by mouth at bedtime as needed (for sleep).  . Menthol, Topical Analgesic, (BIOFREEZE) 4 % GEL Apply 1 application topically 4 (four) times daily as needed.  . Multiple Vitamin (MULTIVITAMIN WITH MINERALS) TABS Take 1 tablet by mouth daily.  . Multiple Vitamins-Minerals (PRESERVISION AREDS 2 PO) Take 2 capsules by mouth daily.  . polyethylene glycol (MIRALAX / GLYCOLAX) packet Take 17 g by mouth daily as needed (for constipation). MIX IN 4 OUNCES OF FLUID  . [DISCONTINUED] atorvastatin (LIPITOR) 20 MG tablet Take 20 mg by mouth daily.   No facility-administered encounter medications on file as of 11/16/2017.     Review of Systems  Constitutional: Negative for  appetite change and fever.  HENT: Negative for congestion and trouble swallowing.   Eyes: Positive for visual disturbance.  Respiratory: Negative for shortness of breath.   Cardiovascular: Negative for chest pain.  Gastrointestinal: Positive for constipation. Negative for diarrhea.  Genitourinary: Negative for dysuria.  Neurological: Positive for dizziness. Negative for numbness and headaches.  Psychiatric/Behavioral: Negative for behavioral problems.    Immunization History  Administered Date(s) Administered  . Influenza-Unspecified 09/07/2016, 09/12/2017  . PPD Test 02/18/2016  . Tdap 10/07/2012   Pertinent  Health Maintenance Due  Topic Date Due  . PNA vac Low Risk Adult (1 of 2 - PCV13) 11/21/2017 (Originally 04[birth year]15/1991)  . INFLUENZA VACCINE  Completed  . DEXA SCAN  Completed   Fall Risk  08/03/2017 10/24/2014  Falls in the past year? No Yes  Number falls in past yr: - 1  Injury with Fall? - Yes  Risk Factor Category  - High Fall Risk  Risk for fall due to : - History of fall(s)   Functional Status Survey:    Vitals:   11/16/17 1037  BP: 120/70  Pulse: 68  Resp: 16  Temp: 97.9 F (36.6 C)  TempSrc: Oral  SpO2: 96%  Weight: 127 lb (57.6 kg)  Height: 5' 1.5" (1.562 m)   Body mass index is 23.61 kg/m.   Wt Readings from Last 3 Encounters:  11/16/17 127 lb (57.6 kg)  09/29/17 125 lb 6.4 oz (56.9 kg)  09/13/17 124 lb (56.2 kg)   Physical Exam  Constitutional: She is oriented to person, place, and time. She appears well-developed and well-nourished. No distress.  HENT:  Head: Normocephalic and atraumatic.  Mouth/Throat: Oropharynx is clear and moist.  Eyes: Conjunctivae and EOM are normal. Right eye exhibits no discharge. Left eye exhibits no discharge.  Neck: Normal range of motion. Neck supple.  Cardiovascular: Normal rate and regular rhythm.  Murmur heard. Pulmonary/Chest: Effort normal and breath sounds normal. She has no wheezes. She has no rales.    Abdominal: Soft. Bowel sounds are normal.  Musculoskeletal: Normal range of motion.  Lymphadenopathy:    She has no cervical adenopathy.  Neurological: She is alert and oriented to person, place, and time.  Skin: She is not diaphoretic.    Labs reviewed: Recent Labs    12/22/16 05/11/17  NA 138 136*  K 4.5 4.3  BUN 17 11  CREATININE 0.8 0.7   Recent Labs    12/22/16 05/11/17  AST 22 27  ALT 15 19  ALKPHOS 59 48   Recent Labs    12/22/16 05/11/17 10/03/17  WBC 3.9 4.3 4.4  HGB 11.6* 11.7* 11.8*  HCT 34* 34* 33*  PLT 254 235 273   Lab Results  Component Value Date   TSH 1.69 06/27/2017   No results found for: HGBA1C Lab Results  Component Value Date   CHOL 146 08/15/2017   HDL 74 (A) 08/15/2017   LDLCALC 57 08/15/2017   TRIG 74 08/15/2017    Significant Diagnostic Results in last 30 days:  No results found.  Assessment/Plan  BPV Stable. Continue meclizine 12.5 mg daily. D/c prn meclizine order. With her ocassional dizziness, d/c amlodipine.   Hypertension Controlled BP. D/c amlodipine 2.5 mg daily order. Check BP daily x 2 weeks, then twice a week x 1 month. Goal BP <140/90  Insomnia D/C prn melatonin for non use. Sleeping well  Constipation Continue colace bid and prune. D/c prn miralax order. Maintain hydration.   Family/ staff Communication: reviewed care plan with patient and charge nurse.    Labs/tests ordered:  none  Oneal Grout, MD Internal Medicine [facility] [address] [place], [place] [address] Cell Phone (Monday-Friday 8 am - 5 pm): [phone] On Call: [phone] and follow prompts after 5 pm and on weekends Office Phone: [phone] Office Fax: [phone]

## 2018-01-02 ENCOUNTER — Non-Acute Institutional Stay: Payer: Medicare Other | Admitting: Internal Medicine

## 2018-01-02 ENCOUNTER — Encounter: Payer: Self-pay | Admitting: Internal Medicine

## 2018-01-02 DIAGNOSIS — I480 Paroxysmal atrial fibrillation: Secondary | ICD-10-CM

## 2018-01-02 DIAGNOSIS — D509 Iron deficiency anemia, unspecified: Secondary | ICD-10-CM | POA: Diagnosis not present

## 2018-01-02 DIAGNOSIS — E782 Mixed hyperlipidemia: Secondary | ICD-10-CM | POA: Diagnosis not present

## 2018-01-02 DIAGNOSIS — M810 Age-related osteoporosis without current pathological fracture: Secondary | ICD-10-CM

## 2018-01-02 DIAGNOSIS — K5909 Other constipation: Secondary | ICD-10-CM

## 2018-01-02 DIAGNOSIS — M1611 Unilateral primary osteoarthritis, right hip: Secondary | ICD-10-CM | POA: Insufficient documentation

## 2018-01-02 DIAGNOSIS — H8110 Benign paroxysmal vertigo, unspecified ear: Secondary | ICD-10-CM | POA: Insufficient documentation

## 2018-01-02 NOTE — Progress Notes (Signed)
Location:  [facility] Room Number: 822 Place of Service:  ALF [phone] Provider:  Oneal GroutMahima Tamanika Heiney MD  Oneal GroutPandey, Sriman Tally, MD  Patient Care Team: Oneal GroutPandey, Kennis Buell, MD as PCP - General (Internal Medicine) Venita LickBrooks, Dahari, MD as Consulting Physician (Orthopedic Surgery) Marykay LexHarding, David W, MD as Consulting Physician (Cardiology) Mast, Man X, NP as Nurse Practitioner (Internal Medicine)  Extended Emergency Contact Information Primary Emergency Contact: [name],Tyia Address: [address]          [address]          [place] [id number] [place] Home Phone: [phone] Mobile Phone: [phone] Relation: Niece Secondary Emergency Contact: [name],[name]  [place] Mobile Phone: [phone] Relation: Friend  Code Status:  DNR  Goals of care: Advanced Directive information Advanced Directives 01/02/2018  Does Patient Have a Medical Advance Directive? Yes  Type of Advance Directive Out of facility DNR (pink MOST or yellow form)  Does patient want to make changes to medical advance directive? No - Patient declined  Copy of Healthcare Power of Attorney in Chart? -  Would patient like information on creating a medical advance directive? -  Pre-existing out of facility DNR order (yellow form or pink MOST form) Yellow form placed in chart (order not valid for inpatient use)     Chief Complaint  Patient presents with  . Medical Management of Chronic Issues    Routine Visit     HPI:  Pt is a 84 y.o. female seen today for medical management of chronic diseases.    Constipation- currently on colace 100 mg bid. Has bowel movement every 2-3 days with hard stool and straining. No blood in stool. Currently also on ferrous gluconate.   Vertigo- has been having occasional episodes of dizziness with headache for last week. Currently on meclizine 12.5 mg daily.   Hyperlipidemia- currently on atorvastatin 10 mg daily.  OA- s/p 2 right hip replacement  surgery in past. Has occasional discomfort to right hip. Currently on tylenol 500 mg q6h prn pain and biofreeze gel qid prn. Currently on vitamin d supplement. Has not required tylenol or biofreeze on chart review in last 2 weeks.    Past Medical History:  Diagnosis Date  . Arteriosclerotic cardiovascular disease   . Bee sting reaction 08/23/2016   Tongue swelling and difficulty swallowing /notes 08/23/2016  . Constipation   . Degenerative joint disease of right hip 10/15/14  . Dysrhythmia    Paroxysmal Atrial Fibrillation  . Fracture of multiple pubic rami (HCC) 10/15/14   Right inferior and superior pubic rami  . HOH (hard of hearing)    Wears bilateral hearing aids  . Hyperlipidemia   . Hypertension   . Macular degeneration   . Memory deficit   . Osteoporosis   . Pain in right foot   . Palpitations   . Paroxysmal atrial fibrillation (HCC)   . Thyroid nodule    Status post surgery  . Ulcer of hard palate   . Vertigo    Past Surgical History:  Procedure Laterality Date  . ABDOMINAL HYSTERECTOMY  1968  . APPENDECTOMY  1940's  . COLONOSCOPY    . FOOT SURGERY Left   . HARDWARE REMOVAL Right 02/16/2016   Procedure: HARDWARE REMOVAL RT HIP;  Surgeon: Sheral Apleyimothy D Murphy, MD;  [facility];  Service: Orthopedics;  Laterality: Right;  . HIP ARTHROPLASTY Right 02/16/2016   Procedure: ARTHROPLASTY BIPOLAR HIP (HEMIARTHROPLASTY);  Surgeon: Sheral Apleyimothy D Murphy, MD;  [facility];  Service: Orthopedics;  Laterality: Right;  . INTRAMEDULLARY (IM) NAIL INTERTROCHANTERIC Right 09/11/2015   Procedure: INTRAMEDULLARY (IM) NAIL INTERTROCHANTRIC;  Surgeon: Sheral Apley, MD;  [facility]: [facility] OR;  Service: Orthopedics;  Laterality: Right;  . KNEE SURGERY      Allergies  Allergen Reactions  . Benadryl [Diphenhydramine] Other (See Comments)    Muscular degeneration  . Penicillins Other (See Comments)    Has patient had a PCN reaction causing immediate rash, facial/tongue/throat swelling, SOB  or lightheadedness with hypotension: Unknown Has patient had a PCN reaction causing severe rash involving mucus membranes or skin necrosis: Unknown Has patient had a PCN reaction that required hospitalization: Unknown Has patient had a PCN reaction occurring within the last 10 years: No If all of the above answers are "NO", then may proceed with Cephalosporin use.     Outpatient Encounter Medications as of 01/02/2018  Medication Sig  . acetaminophen (TYLENOL) 500 MG tablet Take 500 mg by mouth every 6 (six) hours as needed (for pain, headaches, or fever).   Marland Kitchen aspirin 325 MG tablet Take 325 mg by mouth daily.  Marland Kitchen atorvastatin (LIPITOR) 10 MG tablet Take 10 mg by mouth daily.  . Biotin [address] MCG TABS Take 1 tablet by mouth daily.  . Cholecalciferol (VITAMIN D) 2000 units tablet One daily for Vitamin D supplement  . docusate sodium (COLACE) 100 MG capsule Take 100 mg by mouth 2 (two) times daily.   Marland Kitchen EPINEPHrine 0.3 mg/0.3 mL IJ SOAJ injection Inject 0.3 mLs (0.3 mg total) into the muscle once as needed (anaphylaxis/allergic reaction).  . ferrous gluconate (FERGON) 324 MG tablet Take 1 tablet (324 mg total) by mouth daily with breakfast.  . meclizine (ANTIVERT) 12.5 MG tablet Take 12.5 mg by mouth daily.  . Menthol, Topical Analgesic, (BIOFREEZE) 4 % GEL Apply 1 application topically 4 (four) times daily as needed.  . Multiple Vitamin (MULTIVITAMIN WITH MINERALS) TABS Take 1 tablet by mouth daily.  . Multiple Vitamins-Minerals (PRESERVISION AREDS 2 PO) Take 2 capsules by mouth daily.  . [DISCONTINUED] amLODipine (NORVASC) 2.5 MG tablet Take 2.5 mg by mouth daily.  . [DISCONTINUED] meclizine (ANTIVERT) 12.5 MG tablet Take 12.5 mg by mouth 2 (two) times daily as needed for dizziness (in addition to the scheduled dose.).  . [DISCONTINUED] Melatonin 3 MG TABS Take 3 mg by mouth at bedtime as needed (for sleep).  . [DISCONTINUED] polyethylene glycol (MIRALAX / GLYCOLAX) packet Take 17 g by mouth daily  as needed (for constipation). MIX IN 4 OUNCES OF FLUID   No facility-administered encounter medications on file as of 01/02/2018.     Review of Systems  Constitutional: Negative for appetite change, chills, fatigue and fever.  HENT: Positive for hearing loss. Negative for congestion, ear discharge, ear pain, rhinorrhea, sore throat and trouble swallowing.   Eyes: Positive for visual disturbance.       Has corrective glasses  Respiratory: Negative for cough and shortness of breath.   Cardiovascular: Negative for chest pain, palpitations and leg swelling.  Gastrointestinal: Positive for constipation. Negative for abdominal pain, blood in stool, diarrhea, nausea and vomiting.  Genitourinary: Positive for frequency. Negative for dysuria and hematuria.       Wakes up 2-3 times at night to urinate  Musculoskeletal: Positive for arthralgias and gait problem. Negative for back pain.  Skin: Negative for rash.  Neurological: Positive for dizziness. Negative for tremors, seizures, syncope and numbness.  Psychiatric/Behavioral: Negative for behavioral problems, confusion, hallucinations and sleep disturbance.  Immunization History  Administered Date(s) Administered  . Influenza-Unspecified 09/07/2016, 09/12/2017  . PPD Test 02/18/2016  . Tdap 10/07/2012   Pertinent  Health Maintenance Due  Topic Date Due  . PNA vac Low Risk Adult (1 of 2 - PCV13) 04/15/1991  . INFLUENZA VACCINE  Completed  . DEXA SCAN  Completed   Fall Risk  08/03/2017 10/24/2014  Falls in the past year? No Yes  Number falls in past yr: - 1  Injury with Fall? - Yes  Risk Factor Category  - High Fall Risk  Risk for fall due to : - History of fall(s)   Functional Status Survey:    Vitals:   01/02/18 1426  BP: 140/78  Pulse: 72  Resp: 16  Temp: 98.2 F (36.8 C)  TempSrc: Oral  SpO2: 96%  Weight: 125 lb (56.7 kg)  Height: 5' 1.5" (1.562 m)   Body mass index is 23.24 kg/m.   Wt Readings from Last 3 Encounters:   01/02/18 125 lb (56.7 kg)  11/16/17 127 lb (57.6 kg)  09/29/17 125 lb 6.4 oz (56.9 kg)   Physical Exam  Constitutional: She is oriented to person, place, and time. She appears well-developed and well-nourished. No distress.  HENT:  Head: Normocephalic and atraumatic.  Mouth/Throat: Oropharynx is clear and moist. No oropharyngeal exudate.  Eyes: Conjunctivae and EOM are normal. Pupils are equal, round, and reactive to light. Right eye exhibits no discharge. Left eye exhibits no discharge.  No nystagmus, wears corrective glasses  Neck: Normal range of motion. Neck supple.  Cardiovascular: Normal rate, regular rhythm and intact distal pulses.  Murmur heard. Regular heart rate  Pulmonary/Chest: Effort normal. No respiratory distress. She has no wheezes. She has no rales. She exhibits no tenderness.  Right sided expiratory rhonchi +  Abdominal: Soft. Bowel sounds are normal. There is no tenderness. There is no rebound and no guarding.  Musculoskeletal: She exhibits edema.  Trace leg edema, varicose veins, chronic stasis skin changes to LE, uses walker to ambulate at times, arthritis changes to her fingers  Lymphadenopathy:    She has no cervical adenopathy.  Neurological: She is alert and oriented to person, place, and time.  12/29/2016 MMSE 28/30  Skin: Skin is warm and dry. No rash noted. She is not diaphoretic.  Scab to LLE, no signs of infection  Psychiatric: She has a normal mood and affect.    Labs reviewed: Recent Labs    05/11/17  NA 136*  K 4.3  BUN 11  CREATININE 0.7   Recent Labs    05/11/17  AST 27  ALT 19  ALKPHOS 48   Recent Labs    05/11/17 10/03/17  WBC 4.3 4.4  HGB 11.7* 11.8*  HCT 34* 33*  PLT 235 273   Lab Results  Component Value Date   TSH 1.69 06/27/2017   No results found for: HGBA1C Lab Results  Component Value Date   CHOL 146 08/15/2017   HDL 74 (A) 08/15/2017   LDLCALC 57 08/15/2017   TRIG 74 08/15/2017    Significant Diagnostic  Results in last 30 days:  No results found.   12/23/2016- CT head- no acute or traumatic finding. Atrophy and chronic small vessel ischemic changes. Atherosclerotic calcification of major vessels at base of brain  05/08/14- dexa scan- left femur neck T score -2.6  Assessment/Plan  1. Mixed hyperlipidemia Check lipid panel. Continue atorvastatin 10 mg daily. Reviewed CT head from past showing multiple ischemic changes. Obtain MMSE to assess her  cognition.   2. Iron deficiency anemia, unspecified iron deficiency anemia type Check cbc. Continue ferrous supplement. Monitor bowel movement.   3. PAF (paroxysmal atrial fibrillation) (HCC) Controlled HR. Not on any rate controlling agent. Continue aspirin 325 mg daily. Not on anticoagulation with her history of fall with fractures.   4. Benign paroxysmal positional vertigo, unspecified laterality Ongoing with recent worsening of symptoms. Currently on meclizine 12.5 mg daily. Will change this to 12.5 mg tid. Check BP once a day x 2 weeks and then 3 days a week for 2 weeks. Reassess in 2-4 weeks for vertigo. Check cbc and BMP. Slow position change and use of walker encouraged.  5. Chronic constipation Ongoing. Continue colace 100 mg bid. Add miralax every other day for now and monitor. C/w prune. Encourage hydration  6. Osteoarthritis of right hip, unspecified osteoarthritis type Has not required any tylrnol or biofreeze recently. D/c biofreeze for non use and monitor. C/w vit d and add calcium supplement as below. Check vitamin d level  7. Osteoporosis without current pathological fracture, unspecified osteoporosis type Had a right femoral fracture about 2 years back. Currently on vitamin d supplement. Add calcium carbonate 600 mg bid.    Family/ staff Communication: reviewed care plan with patient and charge nurse.    Labs/tests ordered:  Cbc, cmp, vitamin d, lipid panel, dexa, MMSE   I spent 50 minutes in total face-to-face time with the  patient, more than 50% of which was spent in counseling and coordination of care, reviewing test results, reviewing medication and discussing or reviewing the diagnosis with patient.     Oneal Grout, MD Internal Medicine [facility] [address] [place], [place] [address] Cell Phone (Monday-Friday 8 am - 5 pm): [phone] On Call: [phone] and follow prompts after 5 pm and on weekends Office Phone: [phone] Office Fax: [phone]

## 2018-01-03 NOTE — Addendum Note (Signed)
Addended byOneal Grout: Jonanthan Bolender on: 01/03/2018 01:02 PM   Modules accepted: Level of Service

## 2018-01-04 ENCOUNTER — Encounter: Payer: Self-pay | Admitting: *Deleted

## 2018-01-04 LAB — LIPID PANEL
Cholesterol: 189 (ref 0–200)
HDL: 67 (ref 35–70)
LDL CALC: 101
LDl/HDL Ratio: 2.8
Triglycerides: 117 (ref 40–160)

## 2018-01-04 LAB — CBC AND DIFFERENTIAL
HCT: 36 (ref 36–46)
Hemoglobin: 12.9 (ref 12.0–16.0)
PLATELETS: 291 (ref 150–399)
WBC: 5.8

## 2018-01-04 LAB — HEPATIC FUNCTION PANEL
ALK PHOS: 51 (ref 25–125)
ALT: 17 (ref 7–35)
AST: 24 (ref 13–35)
BILIRUBIN, TOTAL: 0.7

## 2018-01-04 LAB — BASIC METABOLIC PANEL
BUN: 17 (ref 4–21)
CREATININE: 0.7 (ref <=1.1)
GLUCOSE: 82
Potassium: 4.5 (ref 3.4–5.3)
Sodium: 133 — AB (ref 137–147)

## 2018-01-04 LAB — VITAMIN D 25 HYDROXY (VIT D DEFICIENCY, FRACTURES): Vit D, 25-Hydroxy: 31

## 2018-01-05 ENCOUNTER — Other Ambulatory Visit: Payer: Self-pay | Admitting: *Deleted

## 2018-01-13 ENCOUNTER — Emergency Department (HOSPITAL_COMMUNITY)
Admission: EM | Admit: 2018-01-13 | Discharge: 2018-01-14 | Disposition: A | Discharge status: Home / Self Care | Payer: Medicare Other | Attending: Emergency Medicine | Admitting: Emergency Medicine

## 2018-01-13 ENCOUNTER — Encounter (HOSPITAL_COMMUNITY): Payer: Self-pay

## 2018-01-13 DIAGNOSIS — Z96641 Presence of right artificial hip joint: Secondary | ICD-10-CM | POA: Diagnosis not present

## 2018-01-13 DIAGNOSIS — Z87891 Personal history of nicotine dependence: Secondary | ICD-10-CM | POA: Insufficient documentation

## 2018-01-13 DIAGNOSIS — Z7982 Long term (current) use of aspirin: Secondary | ICD-10-CM | POA: Diagnosis not present

## 2018-01-13 DIAGNOSIS — R42 Dizziness and giddiness: Secondary | ICD-10-CM

## 2018-01-13 DIAGNOSIS — I10 Essential (primary) hypertension: Secondary | ICD-10-CM | POA: Diagnosis not present

## 2018-01-13 DIAGNOSIS — N390 Urinary tract infection, site not specified: Secondary | ICD-10-CM | POA: Diagnosis not present

## 2018-01-13 DIAGNOSIS — I2510 Atherosclerotic heart disease of native coronary artery without angina pectoris: Secondary | ICD-10-CM | POA: Diagnosis not present

## 2018-01-13 DIAGNOSIS — Z79899 Other long term (current) drug therapy: Secondary | ICD-10-CM | POA: Diagnosis not present

## 2018-01-13 LAB — CBC
HEMATOCRIT: 35.2 % — AB (ref 36.0–46.0)
HEMOGLOBIN: 12.1 g/dL (ref 12.0–15.0)
MCH: 30.9 pg (ref 26.0–34.0)
MCHC: 34.4 g/dL (ref 30.0–36.0)
MCV: 89.8 fL (ref 78.0–100.0)
Platelets: 273 10*3/uL (ref 150–400)
RBC: 3.92 MIL/uL (ref 3.87–5.11)
RDW: 13.2 % (ref 11.5–15.5)
WBC: 6 10*3/uL (ref 4.0–10.5)

## 2018-01-13 LAB — BASIC METABOLIC PANEL
ANION GAP: 10 (ref 5–15)
BUN: 14 mg/dL (ref 6–20)
CHLORIDE: 99 mmol/L — AB (ref 101–111)
CO2: 23 mmol/L (ref 22–32)
Calcium: 9.6 mg/dL (ref 8.9–10.3)
Creatinine, Ser: 0.67 mg/dL (ref 0.44–1.00)
GFR calc Af Amer: >60 mL/min (ref >60)
GFR calc non Af Amer: >60 mL/min (ref >60)
GLUCOSE: 130 mg/dL — AB (ref 65–99)
POTASSIUM: 3.3 mmol/L — AB (ref 3.5–5.1)
Sodium: 132 mmol/L — ABNORMAL LOW (ref 135–145)

## 2018-01-13 LAB — URINALYSIS, ROUTINE W REFLEX MICROSCOPIC
BILIRUBIN URINE: NEGATIVE
Glucose, UA: NEGATIVE mg/dL
Hgb urine dipstick: NEGATIVE
KETONES UR: NEGATIVE mg/dL
Nitrite: POSITIVE — AB
Protein, ur: NEGATIVE mg/dL
SPECIFIC GRAVITY, URINE: 1.004 — AB (ref 1.005–1.030)
SQUAMOUS EPITHELIAL / LPF: NONE SEEN
pH: 7 (ref 5.0–8.0)

## 2018-01-13 LAB — CBG MONITORING, ED: GLUCOSE-CAPILLARY: 144 mg/dL — AB (ref 65–99)

## 2018-01-13 MED ORDER — NITROFURANTOIN MONOHYD MACRO 100 MG PO CAPS: 100.0000 mg | ORAL_CAPSULE | Freq: Two times a day (BID) | ORAL | 0 refills | Status: DC

## 2018-01-13 MED ORDER — SODIUM CHLORIDE 0.9 % IV BOLUS (SEPSIS)
1000.0000 mL | Freq: Once | INTRAVENOUS | Status: AC
  Administered 2018-01-13: 1000 mL via INTRAVENOUS

## 2018-01-13 MED ORDER — NITROFURANTOIN MONOHYD MACRO 100 MG PO CAPS
100.0000 mg | ORAL_CAPSULE | Freq: Once | ORAL | Status: DC
  Filled 2018-01-13: qty 1

## 2018-01-13 NOTE — ED Notes (Signed)
PT states understanding of care given, follow up care, and medication prescribed.  

## 2018-01-13 NOTE — ED Triage Notes (Signed)
[facility] from [facility] c.o sudden onset dizziness and blurred vision tonight. Pt was walking with walker when she also started having a "jerking movement" in bilateral arms and legs, witnessed by staff. Pt sat down and jerking resolved. Pt denies any dizziness or blurred vision at this time. Denies any pain, no neuro deficits noted. VSS CBG 263, nad

## 2018-01-13 NOTE — ED Notes (Signed)
Notified [facility] for transportation back home 

## 2018-01-14 NOTE — ED Provider Notes (Signed)
[facility] EMERGENCY DEPARTMENT Provider Note   CSN: [id number] Arrival date & time:        History   Chief Complaint Chief Complaint  Patient presents with  . Dizziness  . Blurred Vision    HPI Candice Willis is a 84 y.o. female.   Dizziness  Quality:  Lightheadedness and vertigo Severity:  Moderate Onset quality:  Gradual Timing:  Constant Progression:  Resolved Context: standing up   Relieved by:  None tried Worsened by:  Nothing Ineffective treatments:  None tried Associated symptoms: no shortness of breath     Past Medical History:  Diagnosis Date  . Arteriosclerotic cardiovascular disease   . Bee sting reaction 08/23/2016   Tongue swelling and difficulty swallowing /notes 08/23/2016  . Constipation   . Degenerative joint disease of right hip 10/15/14  . Dysrhythmia    Paroxysmal Atrial Fibrillation  . Fracture of multiple pubic rami (HCC) 10/15/14   Right inferior and superior pubic rami  . HOH (hard of hearing)    Wears bilateral hearing aids  . Hyperlipidemia   . Hypertension   . Macular degeneration   . Memory deficit   . Osteoporosis   . Pain in right foot   . Palpitations   . Paroxysmal atrial fibrillation (HCC)   . Thyroid nodule    Status post surgery  . Ulcer of hard palate   . Vertigo     Patient Active Problem List   Diagnosis Date Noted  . Benign paroxysmal positional vertigo 01/02/2018  . Osteoarthritis of right hip 01/02/2018  . Gait abnormality 12/21/2016  . Anemia, iron deficiency 12/21/2016  . Macular degeneration of both eyes 09/08/2016  . Hearing decreased 09/08/2016  . PVC's (premature ventricular contractions) 01/05/2016  . Hyponatremia 09/13/2015  . PAF (paroxysmal atrial fibrillation) (HCC) 09/11/2015  . Vitamin D deficiency 10/24/2014  . Memory deficit   . Thyroid nodule   . Hyperlipidemia 10/20/2014  . Essential hypertension 10/20/2014  . Osteoporosis without current pathological fracture  10/20/2014  . Chronic constipation 10/20/2014    Past Surgical History:  Procedure Laterality Date  . ABDOMINAL HYSTERECTOMY  1968  . APPENDECTOMY  1940's  . COLONOSCOPY    . FOOT SURGERY Left   . HARDWARE REMOVAL Right 02/16/2016   Procedure: HARDWARE REMOVAL RT HIP;  Surgeon: Sheral Apley, MD;  Location: [facility] OR;  Service: Orthopedics;  Laterality: Right;  . HIP ARTHROPLASTY Right 02/16/2016   Procedure: ARTHROPLASTY BIPOLAR HIP (HEMIARTHROPLASTY);  Surgeon: Sheral Apley, MD;  Location: [facility] OR;  Service: Orthopedics;  Laterality: Right;  . INTRAMEDULLARY (IM) NAIL INTERTROCHANTERIC Right 09/11/2015   Procedure: INTRAMEDULLARY (IM) NAIL INTERTROCHANTRIC;  Surgeon: Sheral Apley, MD;  Location: [facility] OR;  Service: Orthopedics;  Laterality: Right;  . KNEE SURGERY      OB History    No data available       Home Medications    Prior to Admission medications   Medication Sig Start Date End Date Taking? Authorizing Provider  acetaminophen (TYLENOL) 500 MG tablet Take 500 mg by mouth every 6 (six) hours as needed (for pain, headaches, or fever).    Yes [provider]  aspirin 325 MG tablet Take 325 mg by mouth daily.   Yes [provider]  atorvastatin (LIPITOR) 10 MG tablet Take 10 mg by mouth daily.   Yes [provider]  Biotin [address]5621 MCG TABS Take 1 tablet by mouth daily.   Yes [provider]  calcium carbonate (  OSCAL) 1500 (600 Ca) MG TABS tablet Take 600 mg of elemental calcium by mouth 2 (two) times daily with a meal.   Yes [provider]  Cholecalciferol (VITAMIN D) 2000 units tablet One daily for Vitamin D supplement Patient taking differently: Take 2,000 Units by mouth daily.  01/19/17  Yes Kimber Relic, MD  docusate sodium (COLACE) 100 MG capsule Take 100 mg by mouth 2 (two) times daily.    Yes [provider]  EPINEPHrine 0.3 mg/0.3 mL IJ SOAJ injection Inject 0.3 mLs (0.3 mg total) into the muscle once as needed  (anaphylaxis/allergic reaction). 08/24/16  Yes Diallo, Abdoulaye, MD  ferrous gluconate (FERGON) 324 MG tablet Take 1 tablet (324 mg total) by mouth daily with breakfast. 09/14/15  Yes Vann, Jessica U, DO  meclizine (ANTIVERT) 12.5 MG tablet Take 12.5 mg by mouth 3 (three) times daily.    Yes [provider]  Multiple Vitamin (MULTIVITAMIN WITH MINERALS) TABS Take 1 tablet by mouth daily.   Yes [provider]  Multiple Vitamins-Minerals (PRESERVISION AREDS 2 PO) Take 1 capsule by mouth daily.    Yes [provider]  polyethylene glycol (MIRALAX / GLYCOLAX) packet Take 17 g by mouth every other day.   Yes [provider]  nitrofurantoin, macrocrystal-monohydrate, (MACROBID) 100 MG capsule Take 1 capsule (100 mg total) by mouth 2 (two) times daily. X 7 days 01/13/18   Mohsin Crum, Barbara Cower, MD    Family History Family History  Problem Relation Age of Onset  . Heart disease Father   . Cancer Father        prostate  . Cancer Maternal Grandmother        colon  . Heart disease Paternal Grandmother   . Hyperlipidemia Sister   . Cancer Sister        breast  . Heart disease Brother   . Hyperlipidemia Brother   . Cancer Brother        prostate    Social History Social History   Tobacco Use  . Smoking status: Former Smoker    Packs/day: 0.50    Types: Cigarettes    Last attempt to quit: 10/05/1971    Years since quitting: 46.3  . Smokeless tobacco: Never Used  Substance Use Topics  . Alcohol use: Yes    Comment: 1-4 per week; eats 10 raisins daily soaked in Gin  . Drug use: No     Allergies   Benadryl [diphenhydramine] and Penicillins   Review of Systems Review of Systems  Constitutional: Positive for fatigue.  Respiratory: Negative for shortness of breath.   Neurological: Positive for dizziness.  All other systems reviewed and are negative.    Physical Exam Updated Vital Signs BP (!) 145/92 (BP Location: Right Arm)   Pulse 68   Temp 98.2  F (36.8 C) (Oral)   Resp 16   Ht 5\' 1"  (1.549 m)   Wt 56.7 kg (125 lb)   SpO2 100%   BMI 23.62 kg/m   Physical Exam  Constitutional: She is oriented to person, place, and time. She appears well-developed and well-nourished.  HENT:  Head: Normocephalic and atraumatic.  Eyes: Conjunctivae and EOM are normal.  Neck: Normal range of motion.  Cardiovascular: Normal rate and regular rhythm.  Pulmonary/Chest: No stridor. No respiratory distress.  Abdominal: Soft. She exhibits no distension.  Neurological: She is alert and oriented to person, place, and time.  No altered mental status, able to give full seemingly accurate history.  Face is symmetric,  EOM's intact, pupils equal and reactive, vision intact, tongue and uvula midline without deviation. Upper and Lower extremity motor 5/5, intact pain perception in distal extremities, 2+ reflexes in biceps, patella and achilles tendons. Able to perform finger to nose normal with both hands. Walks without assistance or evident ataxia.   Skin: Skin is warm and dry.  Nursing note and vitals reviewed.    ED Treatments / Results  Labs (all labs ordered are listed, but only abnormal results are displayed) Labs Reviewed  BASIC METABOLIC PANEL - Abnormal; Notable for the following components:      Result Value   Sodium 132 (*)    Potassium 3.3 (*)    Chloride 99 (*)    Glucose, Bld 130 (*)    All other components within normal limits  CBC - Abnormal; Notable for the following components:   HCT 35.2 (*)    All other components within normal limits  URINALYSIS, ROUTINE W REFLEX MICROSCOPIC - Abnormal; Notable for the following components:   Color, Urine STRAW (*)    Specific Gravity, Urine 1.004 (*)    Nitrite POSITIVE (*)    Leukocytes, UA TRACE (*)    Bacteria, UA MANY (*)    All other components within normal limits  CBG MONITORING, ED - Abnormal; Notable for the following components:   Glucose-Capillary 144 (*)    All other components  within normal limits    EKG  EKG Interpretation  Date/Time:  Saturday January 13 2018 20:21:18 EST Ventricular Rate:  63 PR Interval:  302 QRS Duration: 74 QT Interval:  388 QTC Calculation: 397 R Axis:   65 Text Interpretation:  Sinus rhythm with sinus arrhythmia with 1st degree A-V block Nonspecific ST abnormality Abnormal ECG similar to previous possible u waves? Confirmed by Marily MemosMesner, Abbegale Stehle [phone]([id number]) on 01/13/2018 8:32:39 PM       Radiology No results found.  Procedures Procedures (including critical care time)  Medications Ordered in ED Medications  nitrofurantoin (macrocrystal-monohydrate) (MACROBID) capsule 100 mg (100 mg Oral Incomplete 01/13/18 2351)  sodium chloride 0.9 % bolus 1,000 mL (0 mLs Intravenous Stopped 01/13/18 2146)     Initial Impression / Assessment and Plan / ED Course  I have reviewed the triage vital signs and the nursing notes.  Pertinent labs & imaging results that were available during my care of the patient were reviewed by me and considered in my medical decision making (see chart for details).     Orthostatic symptoms prior to arrival. Found to have UTI. Ambulates normally now. Stable for discharge.   Final Clinical Impressions(s) / ED Diagnoses   Final diagnoses:  Urinary tract infection without hematuria, site unspecified  Dizziness    ED Discharge Orders        Ordered    nitrofurantoin, macrocrystal-monohydrate, (MACROBID) 100 MG capsule  2 times daily     01/13/18 2311       Azahel Belcastro, Barbara CowerJason, MD 01/14/18 [phone]

## 2018-01-16 ENCOUNTER — Non-Acute Institutional Stay: Payer: Medicare Other | Admitting: Internal Medicine

## 2018-01-16 ENCOUNTER — Encounter: Payer: Self-pay | Admitting: Internal Medicine

## 2018-01-16 VITALS — BP 132/62 | HR 76 | Temp 98.3°F | Resp 16 | Ht 62.0 in

## 2018-01-16 DIAGNOSIS — E876 Hypokalemia: Secondary | ICD-10-CM | POA: Diagnosis not present

## 2018-01-16 DIAGNOSIS — N3000 Acute cystitis without hematuria: Secondary | ICD-10-CM | POA: Diagnosis not present

## 2018-01-16 DIAGNOSIS — R42 Dizziness and giddiness: Secondary | ICD-10-CM

## 2018-01-16 DIAGNOSIS — H6121 Impacted cerumen, right ear: Secondary | ICD-10-CM | POA: Diagnosis not present

## 2018-01-16 DIAGNOSIS — E871 Hypo-osmolality and hyponatremia: Secondary | ICD-10-CM | POA: Diagnosis not present

## 2018-01-16 NOTE — Progress Notes (Signed)
[facility]  Provider: Oneal GroutMahima Oluwatomisin Deman MD   Location:  [facility]   Place of Service:  Clinic (12)  PCP: Oneal GroutPandey, Avelynn Sellin, MD Patient Care Team: Oneal GroutPandey, Harvin Konicek, MD as PCP - General (Internal Medicine) Venita LickBrooks, Dahari, MD as Consulting Physician (Orthopedic Surgery) Marykay LexHarding, David W, MD as Consulting Physician (Cardiology) Mast, Man X, NP as Nurse Practitioner (Internal Medicine)  Extended Emergency Contact Information Primary Emergency Contact: [name],Baeleigh Address: [address]          [address]          [place] [id number] [place] Home Phone: [phone] Mobile Phone: [phone] Relation: Niece Secondary Emergency Contact: Dineen Kidronkite,[name]  [place] Mobile Phone: [phone] Relation: Friend   Goals of Care: Advanced Directive information Advanced Directives 01/13/2018  Does Patient Have a Medical Advance Directive? Yes  Type of Advance Directive Out of facility DNR (pink MOST or yellow form)  Does patient want to make changes to medical advance directive? -  Copy of Healthcare Power of Attorney in Chart? -  Would patient like information on creating a medical advance directive? -  Pre-existing out of facility DNR order (yellow form or pink MOST form) -      Chief Complaint  Patient presents with  . Acute Visit    ED follow up for UTI    HPI: Patient is a 84 y.o. female seen today for acute visit for ED follow up. She was in the ED on 01/13/18 with dizziness and blurred vision. She was in her room that day and felt her room to be spinning and felt herself to be twitching. She went to the hall and called for help. Denies any fall or LOC. She then went to the ED. She was diagnosed to have UTI and was placed on nitrofurantoin and discharged back to assisted living. She is seen in clinic today for follow up on UTI. She denies dizziness or blurred vision today. She wears glasses.  Reviewed ED note and labs. urinalysis  positive for nitrite and many bacteria. BMP showed low sodium and low potassium.  No leukocytosis or anemia noted on lab review. urine culture was not sent on review.   Past Medical History:  Diagnosis Date  . Arteriosclerotic cardiovascular disease   . Bee sting reaction 08/23/2016   Tongue swelling and difficulty swallowing /notes 08/23/2016  . Constipation   . Degenerative joint disease of right hip 10/15/14  . Dysrhythmia    Paroxysmal Atrial Fibrillation  . Fracture of multiple pubic rami (HCC) 10/15/14   Right inferior and superior pubic rami  . HOH (hard of hearing)    Wears bilateral hearing aids  . Hyperlipidemia   . Hypertension   . Macular degeneration   . Memory deficit   . Osteoporosis   . Pain in right foot   . Palpitations   . Paroxysmal atrial fibrillation (HCC)   . Thyroid nodule    Status post surgery  . Ulcer of hard palate   . Vertigo    Past Surgical History:  Procedure Laterality Date  . ABDOMINAL HYSTERECTOMY  1968  . APPENDECTOMY  1940's  . COLONOSCOPY    . FOOT SURGERY Left   . HARDWARE REMOVAL Right 02/16/2016   Procedure: HARDWARE REMOVAL RT HIP;  Surgeon: Sheral Apleyimothy D Murphy, MD;  [facility];  Service: Orthopedics;  Laterality: Right;  . HIP ARTHROPLASTY Right 02/16/2016   Procedure: ARTHROPLASTY BIPOLAR HIP (HEMIARTHROPLASTY);  Surgeon: Marcial Pacasimothy  Jamison Neighbor, MD;  Location: [facility] OR;  Service: Orthopedics;  Laterality: Right;  . INTRAMEDULLARY (IM) NAIL INTERTROCHANTERIC Right 09/11/2015   Procedure: INTRAMEDULLARY (IM) NAIL INTERTROCHANTRIC;  Surgeon: Sheral Apley, MD;  Location: [facility] OR;  Service: Orthopedics;  Laterality: Right;  . KNEE SURGERY      reports that she quit smoking about 46 years ago. Her smoking use included cigarettes. She smoked 0.50 packs per day. she has never used smokeless tobacco. She reports that she drinks alcohol. She reports that she does not use drugs. Social History   Socioeconomic History  . Marital status: Single     Spouse name: Not on file  . Number of children: 0  . Years of education: 16+  . Highest education level: Not on file  Social Needs  . Financial resource strain: Not on file  . Food insecurity - worry: Not on file  . Food insecurity - inability: Not on file  . Transportation needs - medical: Not on file  . Transportation needs - non-medical: Not on file  Occupational History  . Occupation: Retired   Tobacco Use  . Smoking status: Former Smoker    Packs/day: 0.50    Types: Cigarettes    Last attempt to quit: 10/05/1971    Years since quitting: 46.3  . Smokeless tobacco: Never Used  Substance and Sexual Activity  . Alcohol use: Yes    Comment: 1-4 per week; eats 10 raisins daily soaked in Gin  . Drug use: No  . Sexual activity: No  Other Topics Concern  . Not on file  Social History Narrative   Lives at [facility]. Moved to [place] 07/06/16   Never married   Caffeine use: 2 cups coffee per day   No tea/soda   Former smoker 1972   Alcohol 1-4 per week. Eats 10 raisins daily soaked in Gin   DNR     Family History  Problem Relation Age of Onset  . Heart disease Father   . Cancer Father        prostate  . Cancer Maternal Grandmother        colon  . Heart disease Paternal Grandmother   . Hyperlipidemia Sister   . Cancer Sister        breast  . Heart disease Brother   . Hyperlipidemia Brother   . Cancer Brother        prostate    Health Maintenance  Topic Date Due  . PNA vac Low Risk Adult (1 of 2 - PCV13) 04/15/1991  . TETANUS/TDAP  10/07/2022  . INFLUENZA VACCINE  Completed  . DEXA SCAN  Completed    Allergies  Allergen Reactions  . Benadryl [Diphenhydramine] Other (See Comments)    Muscular degeneration  . Penicillins Other (See Comments)    Has patient had a PCN reaction causing immediate rash, facial/tongue/throat swelling, SOB or lightheadedness with hypotension: Unknown Has patient had a PCN reaction causing severe rash involving mucus membranes  or skin necrosis: Unknown Has patient had a PCN reaction that required hospitalization: Unknown Has patient had a PCN reaction occurring within the last 10 years: No If all of the above answers are "NO", then may proceed with Cephalosporin use.     Outpatient Encounter Medications as of 01/16/2018  Medication Sig  . acetaminophen (TYLENOL) 500 MG tablet Take 500 mg by mouth every 6 (six) hours as needed (for pain, headaches, or fever).   Marland Kitchen aspirin 325 MG tablet Take 325 mg by mouth  daily.  . atorvastatin (LIPITOR) 10 MG tablet Take 10 mg by mouth daily.  . Biotin 40981 MCG TABS Take 1 tablet by mouth daily.  . calcium carbonate (OSCAL) 1500 (600 Ca) MG TABS tablet Take 600 mg of elemental calcium by mouth 2 (two) times daily with a meal.  . Cholecalciferol (VITAMIN D3) 2000 units TABS Take 2,000 Units by mouth daily.  Marland Kitchen docusate sodium (COLACE) 100 MG capsule Take 100 mg by mouth 2 (two) times daily.   Marland Kitchen EPINEPHrine 0.3 mg/0.3 mL IJ SOAJ injection Inject 0.3 mLs (0.3 mg total) into the muscle once as needed (anaphylaxis/allergic reaction).  . ferrous gluconate (FERGON) 324 MG tablet Take 1 tablet (324 mg total) by mouth daily with breakfast.  . meclizine (ANTIVERT) 12.5 MG tablet Take 12.5 mg by mouth 3 (three) times daily.   . Multiple Vitamin (MULTIVITAMIN WITH MINERALS) TABS Take 1 tablet by mouth daily.  . Multiple Vitamins-Minerals (PRESERVISION AREDS 2 PO) Take 1 capsule by mouth daily.   . nitrofurantoin, macrocrystal-monohydrate, (MACROBID) 100 MG capsule Take 1 capsule (100 mg total) by mouth 2 (two) times daily. X 7 days  . polyethylene glycol (MIRALAX / GLYCOLAX) packet Take 17 g by mouth every other day.  . [DISCONTINUED] Cholecalciferol (VITAMIN D) 2000 units tablet One daily for Vitamin D supplement (Patient taking differently: Take 2,000 Units by mouth daily. )   No facility-administered encounter medications on file as of 01/16/2018.     Review of Systems  Constitutional:  Positive for fatigue. Negative for appetite change, chills, diaphoresis and fever.       Trying to drink more water. Aiming for 8 cups of 8 oz, drinking 5-6 cups at present. Has been taking salad, fruits and nuts  HENT: Positive for hearing loss.   Eyes: Positive for visual disturbance. Negative for pain and itching.       Wears corrective glasses  Respiratory: Negative for cough and shortness of breath.   Cardiovascular: Negative for chest pain and palpitations.  Gastrointestinal: Positive for constipation. Negative for abdominal pain, diarrhea, nausea and vomiting.  Genitourinary: Negative for dysuria, flank pain, frequency and hematuria.  Musculoskeletal: Positive for gait problem.       Uses walker, no fall reported  Neurological: Negative for dizziness and headaches.       No further twitching. Has history of vertigo, denies any since ED visit     Vitals:   01/16/18 0930  BP: 132/62  Pulse: 76  Resp: 16  Temp: 98.3 F (36.8 C)  TempSrc: Oral  SpO2: 98%  Height: 5\' 2"  (1.575 m)   Body mass index is 22.86 kg/m.   Wt Readings from Last 3 Encounters:  01/13/18 125 lb (56.7 kg)  01/02/18 125 lb (56.7 kg)  11/16/17 127 lb (57.6 kg)   Physical Exam  Constitutional: She is oriented to person, place, and time. She appears well-developed and well-nourished. No distress.  HENT:  Head: Normocephalic and atraumatic.  Mouth/Throat: Oropharynx is clear and moist.  Impacted cerumen to right side.  Eyes: Conjunctivae and EOM are normal. Pupils are equal, round, and reactive to light.  Neck: Neck supple.  Cardiovascular: Normal rate and regular rhythm.  Pulmonary/Chest: Effort normal and breath sounds normal. She has no wheezes. She has no rales.  Abdominal: Soft. Bowel sounds are normal. She exhibits no distension and no mass. There is no tenderness. There is no guarding.  Musculoskeletal:  Able to move all 4 extremities, unsteady guarded gait, uses a walker  Lymphadenopathy:  She has no cervical adenopathy.  Neurological: She is alert and oriented to person, place, and time.  Skin: Skin is warm and dry. She is not diaphoretic.  Psychiatric: She has a normal mood and affect.    Labs reviewed: Basic Metabolic Panel: Recent Labs    05/11/17 01/04/18 01/13/18 2022  NA 136* 133* 132*  K 4.3 4.5 3.3*  CL  --   --  99*  CO2  --   --  23  GLUCOSE  --   --  130*  BUN 11 17 14   CREATININE 0.7 0.7 0.67  CALCIUM  --   --  9.6   Liver Function Tests: Recent Labs    05/11/17 01/04/18  AST 27 24  ALT 19 17  ALKPHOS 48 51   No results for input(s): LIPASE, AMYLASE in the last 8760 hours. No results for input(s): AMMONIA in the last 8760 hours. CBC: Recent Labs    10/03/17 01/04/18 01/13/18 2022  WBC 4.4 5.8 6.0  HGB 11.8* 12.9 12.1  HCT 33* 36 35.2*  MCV  --   --  89.8  PLT 273 291 273   Cardiac Enzymes: No results for input(s): CKTOTAL, CKMB, CKMBINDEX, TROPONINI in the last 8760 hours. BNP: Invalid input(s): POCBNP No results found for: HGBA1C Lab Results  Component Value Date   TSH 1.69 06/27/2017   No results found for: VITAMINB12 No results found for: FOLATE No results found for: IRON, TIBC, FERRITIN  Lipid Panel: Recent Labs    08/15/17 01/04/18  CHOL 146 189  HDL 74* 67  LDLCALC 57 101  TRIG 74 117   No results found for: HGBA1C  Procedures since last visit: No results found.  Assessment/Plan  1. Acute cystitis without hematuria No culture report to follow. Currently asymptomatic. Continue and complete course of nitrofurantoin   2. Hyponatremia Encouraged hydration, check bmp with mg  3. Hypokalemia Denies muscle cramps or twitching at present, low k at ED, check BMP and mg  4. Dizziness Her right ear wax and BPV could be contributing along with dehydration. Continue meclizine. Slow position change and walker for ambulation. Ear lavage in a week.   5. Right ear wax Debrox bid x 1 week f/b ear lavage   Labs/tests  ordered: cbc, cmp, mg  Communication: reviewed care plan with patient and charge nurse.   Reviewed ED notes and lab results. New orders provided to take back to ALF  [facility], MD Internal Medicine [facility] [address] [place], [place] [address] Cell Phone (Monday-Friday 8 am - 5 pm): [phone] On Call: [phone] and follow prompts after 5 pm and on weekends Office Phone: [phone] Office Fax: [phone]

## 2018-01-18 ENCOUNTER — Other Ambulatory Visit: Payer: Self-pay | Admitting: *Deleted

## 2018-01-18 LAB — CBC AND DIFFERENTIAL
HCT: 37 (ref 36–46)
Hemoglobin: 13 (ref 12.0–16.0)
PLATELETS: 258 (ref 150–399)
WBC: 4.9

## 2018-01-18 LAB — BASIC METABOLIC PANEL
BUN: 15 (ref 4–21)
CREATININE: 0.7 (ref <=1.1)
GLUCOSE: 83
Potassium: 4.3 (ref 3.4–5.3)
Sodium: 135 — AB (ref 137–147)

## 2018-01-18 LAB — HEPATIC FUNCTION PANEL
ALT: 19 (ref 7–35)
AST: 28 (ref 13–35)
Alkaline Phosphatase: 57 (ref 25–125)
Bilirubin, Total: 0.7

## 2018-02-05 LAB — HM DEXA SCAN

## 2018-02-19 ENCOUNTER — Encounter: Payer: Self-pay | Admitting: Nurse Practitioner

## 2018-02-19 ENCOUNTER — Non-Acute Institutional Stay: Payer: Medicare Other | Admitting: Nurse Practitioner

## 2018-02-19 DIAGNOSIS — R350 Frequency of micturition: Secondary | ICD-10-CM

## 2018-02-19 DIAGNOSIS — D509 Iron deficiency anemia, unspecified: Secondary | ICD-10-CM

## 2018-02-19 DIAGNOSIS — R531 Weakness: Secondary | ICD-10-CM | POA: Insufficient documentation

## 2018-02-19 DIAGNOSIS — R5383 Other fatigue: Secondary | ICD-10-CM | POA: Diagnosis not present

## 2018-02-19 NOTE — Progress Notes (Signed)
Location:   [facility] Room Number: [id number] Place of Service:  SNF (31) Provider: Arna SnipeManXie Mast NP  Oneal GroutPandey, Mahima, MD  Patient Care Team: Oneal GroutPandey, Mahima, MD as PCP - General (Internal Medicine) Venita LickBrooks, Dahari, MD as Consulting Physician (Orthopedic Surgery) Marykay LexHarding, David W, MD as Consulting Physician (Cardiology) Mast, Man X, NP as Nurse Practitioner (Internal Medicine)  Extended Emergency Contact Information Primary Emergency Contact: [name],Ulyssa Address: [address]           [place], [place] [id number] [place] Mobile Phone: [phone] Relation: Niece Secondary Emergency Contact: [name],[name]  [place] Mobile Phone: [phone] Relation: Friend  Code Status: DNR Goals of care: Advanced Directive information Advanced Directives 02/19/2018  Does Patient Have a Medical Advance Directive? Yes  Type of Advance Directive Out of facility DNR (pink MOST or yellow form)  Does patient want to make changes to medical advance directive? No - Patient declined  Copy of Healthcare Power of Attorney in Chart? -  Would patient like information on creating a medical advance directive? -  Pre-existing out of facility DNR order (yellow form or pink MOST form) Yellow form placed in chart (order not valid for inpatient use)     Chief Complaint  Patient presents with  . Acute Visit    Fatigue    HPI:  Pt is a 84 y.o. female seen today for an acute visit for urinary frequency, the patient denied dysuria, urinary frequency is at her baseline, denied suprapubic and CVA area discomfort, tenderness, or pain. She is afebrile, she denied dizziness, headache, change of vision, chest pain/pressure or palpitation. She denied nausea, vomiting, diarrhea, or constipation. Hx of anemia, on FE, last Hgb 13 01/05/18. The patient stated she feels tired than usual for 2-3 weeks, sleeps and eats at her baseline. Her last fully treated UTI with Nitrofurantoin 01/20/18.    Past  Medical History:  Diagnosis Date  . Arteriosclerotic cardiovascular disease   . Bee sting reaction 08/23/2016   Tongue swelling and difficulty swallowing /notes 08/23/2016  . Constipation   . Degenerative joint disease of right hip 10/15/14  . Dysrhythmia    Paroxysmal Atrial Fibrillation  . Fracture of multiple pubic rami (HCC) 10/15/14   Right inferior and superior pubic rami  . HOH (hard of hearing)    Wears bilateral hearing aids  . Hyperlipidemia   . Hypertension   . Macular degeneration   . Memory deficit   . Osteoporosis   . Pain in right foot   . Palpitations   . Paroxysmal atrial fibrillation (HCC)   . Thyroid nodule    Status post surgery  . Ulcer of hard palate   . Vertigo    Past Surgical History:  Procedure Laterality Date  . ABDOMINAL HYSTERECTOMY  1968  . APPENDECTOMY  1940's  . COLONOSCOPY    . FOOT SURGERY Left   . HARDWARE REMOVAL Right 02/16/2016   Procedure: HARDWARE REMOVAL RT HIP;  Surgeon: Sheral Apleyimothy D Murphy, MD;  [facility];  Service: Orthopedics;  Laterality: Right;  . HIP ARTHROPLASTY Right 02/16/2016   Procedure: ARTHROPLASTY BIPOLAR HIP (HEMIARTHROPLASTY);  Surgeon: Sheral Apleyimothy D Murphy, MD;  [facility];  Service: Orthopedics;  Laterality: Right;  . INTRAMEDULLARY (IM) NAIL INTERTROCHANTERIC Right 09/11/2015   Procedure: INTRAMEDULLARY (IM) NAIL INTERTROCHANTRIC;  Surgeon: Sheral Apleyimothy D Murphy, MD;  Location: [facility] OR;  Service: Orthopedics;  Laterality: Right;  . KNEE SURGERY      Allergies  Allergen Reactions  . Benadryl [  Diphenhydramine] Other (See Comments)    Muscular degeneration  . Penicillins Other (See Comments)    Has patient had a PCN reaction causing immediate rash, facial/tongue/throat swelling, SOB or lightheadedness with hypotension: Unknown Has patient had a PCN reaction causing severe rash involving mucus membranes or skin necrosis: Unknown Has patient had a PCN reaction that required hospitalization: Unknown Has patient had a  PCN reaction occurring within the last 10 years: No If all of the above answers are "NO", then may proceed with Cephalosporin use.     Allergies as of 02/19/2018      Reactions   Benadryl [diphenhydramine] Other (See Comments)   Muscular degeneration   Penicillins Other (See Comments)   Has patient had a PCN reaction causing immediate rash, facial/tongue/throat swelling, SOB or lightheadedness with hypotension: Unknown Has patient had a PCN reaction causing severe rash involving mucus membranes or skin necrosis: Unknown Has patient had a PCN reaction that required hospitalization: Unknown Has patient had a PCN reaction occurring within the last 10 years: No If all of the above answers are "NO", then may proceed with Cephalosporin use.      Medication List        Accurate as of 02/19/18 11:59 PM. Always use your most recent med list.          acetaminophen 500 MG tablet Commonly known as:  TYLENOL Take 500 mg by mouth every 6 (six) hours as needed (for pain, headaches, or fever).   aspirin 325 MG tablet Take 325 mg by mouth daily.   atorvastatin 10 MG tablet Commonly known as:  LIPITOR Take 10 mg by mouth daily.   Biotin [address] MCG Tabs Take 1 tablet by mouth daily.   calcium carbonate 1500 (600 Ca) MG Tabs tablet Commonly known as:  OSCAL Take 600 mg of elemental calcium by mouth 2 (two) times daily with a meal.   docusate sodium 100 MG capsule Commonly known as:  COLACE Take 100 mg by mouth 2 (two) times daily.   EPINEPHrine 0.3 mg/0.3 mL Soaj injection Commonly known as:  EPI-PEN Inject 0.3 mLs (0.3 mg total) into the muscle once as needed (anaphylaxis/allergic reaction).   ferrous gluconate 324 MG tablet Commonly known as:  FERGON Take 1 tablet (324 mg total) by mouth daily with breakfast.   meclizine 12.5 MG tablet Commonly known as:  ANTIVERT Take 12.5 mg by mouth 3 (three) times daily.   multivitamin with minerals Tabs tablet Take 1 tablet by mouth  daily.   polyethylene glycol packet Commonly known as:  MIRALAX / GLYCOLAX Take 17 g by mouth every other day.   PRESERVISION AREDS 2 PO Take 1 capsule by mouth daily.   Vitamin D3 2000 units Tabs Take 2,000 Units by mouth daily.       Review of Systems  Constitutional: Positive for fatigue. Negative for activity change, appetite change, chills, diaphoresis and fever.  HENT: Positive for hearing loss. Negative for congestion, trouble swallowing and voice change.   Respiratory: Negative for cough, choking, chest tightness, shortness of breath and wheezing.   Cardiovascular: Negative for chest pain, palpitations and leg swelling.  Gastrointestinal: Negative for abdominal distention, abdominal pain, constipation, diarrhea, nausea and vomiting.  Genitourinary: Positive for frequency. Negative for difficulty urinating, dysuria and urgency.  Musculoskeletal: Positive for back pain and gait problem.       Chronic lower back pain in am, usually gets better after up and dressed.   Skin: Negative for color change and pallor.  Neurological: Negative for speech difficulty, weakness and headaches.  Psychiatric/Behavioral: Negative for agitation, behavioral problems, confusion, hallucinations and sleep disturbance.    Immunization History  Administered Date(s) Administered  . Influenza-Unspecified 09/07/2016, 09/12/2017  . PPD Test 02/18/2016  . Tdap 10/07/2012   Pertinent  Health Maintenance Due  Topic Date Due  . PNA vac Low Risk Adult (1 of 2 - PCV13) 04/15/1991  . INFLUENZA VACCINE  06/21/2018  . DEXA SCAN  Completed   Fall Risk  08/03/2017 10/24/2014  Falls in the past year? No Yes  Number falls in past yr: - 1  Injury with Fall? - Yes  Risk Factor Category  - High Fall Risk  Risk for fall due to : - History of fall(s)   Functional Status Survey:    Vitals:   02/19/18 1147  BP: 138/64  Pulse: 77  Resp: 18  Temp: 98.4 F (36.9 C)  Weight: 125 lb 8 oz (56.9 kg)  Height:  5\' 1"  (1.549 m)   Body mass index is 23.71 kg/m. Physical Exam  Constitutional: She is oriented to person, place, and time. She appears well-developed and well-nourished.  HENT:  Head: Normocephalic and atraumatic.  Eyes: Pupils are equal, round, and reactive to light. Conjunctivae and EOM are normal.  Neck: Normal range of motion. Neck supple.  Cardiovascular: Normal rate and regular rhythm.  No murmur heard. Pulmonary/Chest: Effort normal and breath sounds normal. She has no wheezes. She has no rales.  Abdominal: Soft. Bowel sounds are normal. She exhibits no distension. There is no tenderness.  Musculoskeletal: She exhibits no edema or tenderness.  Neurological: She is alert and oriented to person, place, and time. She exhibits normal muscle tone. Coordination normal.  Skin: Skin is warm and dry. No rash noted. No erythema.  Psychiatric: She has a normal mood and affect. Her behavior is normal.    Labs reviewed: Recent Labs    01/04/18 01/13/18 2022 01/18/18  NA 133* 132* 135*  K 4.5 3.3* 4.3  CL  --  99*  --   CO2  --  23  --   GLUCOSE  --  130*  --   BUN 17 14 15   CREATININE 0.7 0.67 0.7  CALCIUM  --  9.6  --    Recent Labs    05/11/17 01/04/18 01/18/18  AST 27 24 28   ALT 19 17 19   ALKPHOS 48 51 57   Recent Labs    01/04/18 01/13/18 2022 01/18/18  WBC 5.8 6.0 4.9  HGB 12.9 12.1 13.0  HCT 36 35.2* 37  MCV  --  89.8  --   PLT 291 273 258   Lab Results  Component Value Date   TSH 1.69 06/27/2017   No results found for: HGBA1C Lab Results  Component Value Date   CHOL 189 01/04/2018   HDL 67 01/04/2018   LDLCALC 101 01/04/2018   TRIG 117 01/04/2018    Significant Diagnostic Results in last 30 days:  No results found.  Assessment/Plan: Urinary frequency urinary frequency, the patient denied dysuria, urinary frequency is at her baseline, denied suprapubic and CVA area discomfort, tenderness, or pain. She is afebrile, she denied dizziness, headache,  change of vision, chest pain/pressure or palpitation. She denied nausea, vomiting, diarrhea, or constipation. Will obtain CBC/diff, CMP, monitor for urinary frequency and s/s of UTI, VS q shift x72hours. Observe.   Fatigue Anemia, recent UTI, advanced age of 89 may contributory, update CBC/diff, CMP  Anemia, iron deficiency Hx of anemia,  on FE, last Hgb 13 01/05/18/19. Update CBC    Family/ staff Communication: plan of care reviewed with the patient and charge nurse.   Labs/tests ordered:  CBC/diff, CMP  Time spend 25 minutes.

## 2018-02-19 NOTE — Assessment & Plan Note (Signed)
urinary frequency, the patient denied dysuria, urinary frequency is at her baseline, denied suprapubic and CVA area discomfort, tenderness, or pain. She is afebrile, she denied dizziness, headache, change of vision, chest pain/pressure or palpitation. She denied nausea, vomiting, diarrhea, or constipation. Will obtain CBC/diff, CMP, monitor for urinary frequency and s/s of UTI, VS q shift x72hours. Observe.

## 2018-02-19 NOTE — Progress Notes (Signed)
Location:  [facility] Room Number: 822 Place of Service:  SNF (31) Provider:  Mast, Manxie  NP  Oneal GroutPandey, Mahima, MD  Patient Care Team: Oneal GroutPandey, Mahima, MD as PCP - General (Internal Medicine) Venita LickBrooks, Dahari, MD as Consulting Physician (Orthopedic Surgery) Marykay LexHarding, David W, MD as Consulting Physician (Cardiology) Mast, Man X, NP as Nurse Practitioner (Internal Medicine)  Extended Emergency Contact Information Primary Emergency Contact: [name],Ashley Address: [address]          [address]          [place] [id number] [place] Home Phone: [phone] Mobile Phone: [phone] Relation: Niece Secondary Emergency Contact: [name],[name]  [place] Mobile Phone: [phone] Relation: Friend  Code Status:  DNR Goals of care: Advanced Directive information Advanced Directives 02/19/2018  Does Patient Have a Medical Advance Directive? Yes  Type of Advance Directive Out of facility DNR (pink MOST or yellow form)  Does patient want to make changes to medical advance directive? No - Patient declined  Copy of Healthcare Power of Attorney in Chart? -  Would patient like information on creating a medical advance directive? -  Pre-existing out of facility DNR order (yellow form or pink MOST form) Yellow form placed in chart (order not valid for inpatient use)     Chief Complaint  Patient presents with  . Acute Visit    Fatigue    HPI:  Pt is a 84 y.o. female seen today for an acute visit for    Past Medical History:  Diagnosis Date  . Arteriosclerotic cardiovascular disease   . Bee sting reaction 08/23/2016   Tongue swelling and difficulty swallowing /notes 08/23/2016  . Constipation   . Degenerative joint disease of right hip 10/15/14  . Dysrhythmia    Paroxysmal Atrial Fibrillation  . Fracture of multiple pubic rami (HCC) 10/15/14   Right inferior and superior pubic rami  . HOH (hard of hearing)    Wears bilateral  hearing aids  . Hyperlipidemia   . Hypertension   . Macular degeneration   . Memory deficit   . Osteoporosis   . Pain in right foot   . Palpitations   . Paroxysmal atrial fibrillation (HCC)   . Thyroid nodule    Status post surgery  . Ulcer of hard palate   . Vertigo    Past Surgical History:  Procedure Laterality Date  . ABDOMINAL HYSTERECTOMY  1968  . APPENDECTOMY  1940's  . COLONOSCOPY    . FOOT SURGERY Left   . HARDWARE REMOVAL Right 02/16/2016   Procedure: HARDWARE REMOVAL RT HIP;  Surgeon: Sheral Apleyimothy D Murphy, MD;  [facility];  Service: Orthopedics;  Laterality: Right;  . HIP ARTHROPLASTY Right 02/16/2016   Procedure: ARTHROPLASTY BIPOLAR HIP (HEMIARTHROPLASTY);  Surgeon: Sheral Apleyimothy D Murphy, MD;  [facility];  Service: Orthopedics;  Laterality: Right;  . INTRAMEDULLARY (IM) NAIL INTERTROCHANTERIC Right 09/11/2015   Procedure: INTRAMEDULLARY (IM) NAIL INTERTROCHANTRIC;  Surgeon: Sheral Apleyimothy D Murphy, MD;  Location: [facility] OR;  Service: Orthopedics;  Laterality: Right;  . KNEE SURGERY      Allergies  Allergen Reactions  . Benadryl [Diphenhydramine] Other (See Comments)    Muscular degeneration  . Penicillins Other (See Comments)    Has patient had a PCN reaction causing immediate rash, facial/tongue/throat swelling, SOB or lightheadedness with hypotension: Unknown Has patient had a PCN reaction causing severe rash involving mucus membranes or skin necrosis: Unknown Has patient had a PCN reaction that required  hospitalization: Unknown Has patient had a PCN reaction occurring within the last 10 years: No If all of the above answers are "NO", then may proceed with Cephalosporin use.     Outpatient Encounter Medications as of 02/19/2018  Medication Sig  . acetaminophen (TYLENOL) 500 MG tablet Take 500 mg by mouth every 6 (six) hours as needed (for pain, headaches, or fever).   Marland Kitchen aspirin 325 MG tablet Take 325 mg by mouth daily.  Marland Kitchen atorvastatin (LIPITOR) 10 MG tablet Take 10 mg  by mouth daily.  . Biotin 40981 MCG TABS Take 1 tablet by mouth daily.  . calcium carbonate (OSCAL) 1500 (600 Ca) MG TABS tablet Take 600 mg of elemental calcium by mouth 2 (two) times daily with a meal.  . Cholecalciferol (VITAMIN D3) 2000 units TABS Take 2,000 Units by mouth daily.  Marland Kitchen docusate sodium (COLACE) 100 MG capsule Take 100 mg by mouth 2 (two) times daily.   Marland Kitchen EPINEPHrine 0.3 mg/0.3 mL IJ SOAJ injection Inject 0.3 mLs (0.3 mg total) into the muscle once as needed (anaphylaxis/allergic reaction).  . ferrous gluconate (FERGON) 324 MG tablet Take 1 tablet (324 mg total) by mouth daily with breakfast.  . meclizine (ANTIVERT) 12.5 MG tablet Take 12.5 mg by mouth 3 (three) times daily.   . Multiple Vitamin (MULTIVITAMIN WITH MINERALS) TABS Take 1 tablet by mouth daily.  . Multiple Vitamins-Minerals (PRESERVISION AREDS 2 PO) Take 1 capsule by mouth daily.   . polyethylene glycol (MIRALAX / GLYCOLAX) packet Take 17 g by mouth every other day.  . [DISCONTINUED] nitrofurantoin, macrocrystal-monohydrate, (MACROBID) 100 MG capsule Take 1 capsule (100 mg total) by mouth 2 (two) times daily. X 7 days   No facility-administered encounter medications on file as of 02/19/2018.     Review of Systems  Immunization History  Administered Date(s) Administered  . Influenza-Unspecified 09/07/2016, 09/12/2017  . PPD Test 02/18/2016  . Tdap 10/07/2012   Pertinent  Health Maintenance Due  Topic Date Due  . PNA vac Low Risk Adult (1 of 2 - PCV13) 04/15/1991  . INFLUENZA VACCINE  06/21/2018  . DEXA SCAN  Completed   Fall Risk  08/03/2017 10/24/2014  Falls in the past year? No Yes  Number falls in past yr: - 1  Injury with Fall? - Yes  Risk Factor Category  - High Fall Risk  Risk for fall due to : - History of fall(s)   Functional Status Survey:    Vitals:   02/19/18 1147  BP: 138/64  Pulse: 77  Resp: 18  Temp: 98.4 F (36.9 C)  Weight: 125 lb 8 oz (56.9 kg)  Height: 5\' 1"  (1.549 m)   Body  mass index is 23.71 kg/m. Physical Exam  Labs reviewed: Recent Labs    01/04/18 01/13/18 2022 01/18/18  NA 133* 132* 135*  K 4.5 3.3* 4.3  CL  --  99*  --   CO2  --  23  --   GLUCOSE  --  130*  --   BUN 17 14 15   CREATININE 0.7 0.67 0.7  CALCIUM  --  9.6  --    Recent Labs    05/11/17 01/04/18 01/18/18  AST 27 24 28   ALT 19 17 19   ALKPHOS 48 51 57   Recent Labs    01/04/18 01/13/18 2022 01/18/18  WBC 5.8 6.0 4.9  HGB 12.9 12.1 13.0  HCT 36 35.2* 37  MCV  --  89.8  --   PLT 291 273 258  Lab Results  Component Value Date   TSH 1.69 06/27/2017   No results found for: HGBA1C Lab Results  Component Value Date   CHOL 189 01/04/2018   HDL 67 01/04/2018   LDLCALC 101 01/04/2018   TRIG 117 01/04/2018    Significant Diagnostic Results in last 30 days:  No results found.  Assessment/Plan There are no diagnoses linked to this encounter.   Family/ staff Communication:   Labs/tests ordered:

## 2018-02-19 NOTE — Assessment & Plan Note (Signed)
Anemia, recent UTI, advanced age of 89 may contributory, update CBC/diff, CMP

## 2018-02-19 NOTE — Assessment & Plan Note (Signed)
Hx of anemia, on FE, last Hgb 13 01/05/18/19. Update CBC

## 2018-02-20 LAB — CBC AND DIFFERENTIAL
HCT: 34 — AB (ref 36–46)
HEMOGLOBIN: 11.8 — AB (ref 12.0–16.0)
Platelets: 269 (ref 150–399)
WBC: 4.4

## 2018-02-20 LAB — HEPATIC FUNCTION PANEL
ALK PHOS: 47 (ref 25–125)
ALT: 14 (ref 7–35)
AST: 21 (ref 13–35)

## 2018-02-20 LAB — BASIC METABOLIC PANEL
BUN: 13 (ref 4–21)
Creatinine: 0.7 (ref <=1.1)
Glucose: 86
POTASSIUM: 4.6 (ref 3.4–5.3)
SODIUM: 135 — AB (ref 137–147)

## 2018-02-21 ENCOUNTER — Other Ambulatory Visit: Payer: Self-pay | Admitting: *Deleted

## 2018-02-26 ENCOUNTER — Encounter: Payer: Self-pay | Admitting: Nurse Practitioner

## 2018-02-26 ENCOUNTER — Non-Acute Institutional Stay: Payer: Medicare Other | Admitting: Nurse Practitioner

## 2018-02-26 DIAGNOSIS — R413 Other amnesia: Secondary | ICD-10-CM | POA: Diagnosis not present

## 2018-02-26 DIAGNOSIS — R10819 Abdominal tenderness, unspecified site: Secondary | ICD-10-CM

## 2018-02-26 DIAGNOSIS — R350 Frequency of micturition: Secondary | ICD-10-CM

## 2018-02-26 DIAGNOSIS — N3942 Incontinence without sensory awareness: Secondary | ICD-10-CM | POA: Diagnosis not present

## 2018-02-26 DIAGNOSIS — R32 Unspecified urinary incontinence: Secondary | ICD-10-CM | POA: Insufficient documentation

## 2018-02-26 NOTE — Assessment & Plan Note (Signed)
Appears more confused, inconsistent with her HPI and memory recalls, no focal weakness noted. Observe.

## 2018-02-26 NOTE — Assessment & Plan Note (Addendum)
new onset of incontinent of urine, 02/24/18 the patient urinated in her recliner, she stated she forgot to get up to use the restroom. 02/25/18 staff noted the patient's trash was full of soaked pads. She appears more confused than usual. Suprapubic area tenderness was noted upon my examination which the ROS was noted reported by the patient. Obtain UA C/S. White count 02/20/18 was 4.4, the patient has been afebrile.

## 2018-02-26 NOTE — Assessment & Plan Note (Signed)
It was reported 02/19/18, no further complaint upon my visit today.

## 2018-02-26 NOTE — Progress Notes (Signed)
Location:  [facility] Room Number: 822 Place of Service:  ALF [id number]) Provider:  Onelia Cadmus, Manxie  NP  Oneal Grout, MD  Patient Care Team: Oneal Grout, MD as PCP - General (Internal Medicine) Venita Lick, MD as Consulting Physician (Orthopedic Surgery) Marykay Lex, MD as Consulting Physician (Cardiology) Joslynne Klatt X, NP as Nurse Practitioner (Internal Medicine)  Extended Emergency Contact Information Primary Emergency Contact: [name],Tehila Address: [address]           [place], [place] [address] [place] Phone: [phone] Relation: Niece Secondary Emergency Contact: [name],[name]  [place] Mobile Phone: [phone] Relation: Friend  Code Status:  DNR Goals of care: Advanced Directive information Advanced Directives 02/19/2018  Does Patient Have a Medical Advance Directive? Yes  Type of Advance Directive Out of facility DNR (pink MOST or yellow form)  Does patient want to make changes to medical advance directive? No - Patient declined  Copy of Healthcare Power of Attorney in Chart? -  Would patient like information on creating a medical advance directive? -  Pre-existing out of facility DNR order (yellow form or pink MOST form) Yellow form placed in chart (order not valid for inpatient use)     Chief Complaint  Patient presents with  . Acute Visit    ? UTI     HPI:  Pt is a 84 y.o. female seen today for an acute visit for the new onset of incontinent of urine, 02/24/18 the patient urinated in her recliner, she stated she forgot to get up to use the restroom. 02/25/18 staff noted the patient's trash was full of soaked pads. She appears more confused than usual. Suprapubic area tenderness was noted upon my examination which the ROS was noted reported by the patient.    Past Medical History:  Diagnosis Date  . Arteriosclerotic cardiovascular disease   . Bee sting reaction 08/23/2016   Tongue swelling and  difficulty swallowing /notes 08/23/2016  . Constipation   . Degenerative joint disease of right hip 10/15/14  . Dysrhythmia    Paroxysmal Atrial Fibrillation  . Fracture of multiple pubic rami (HCC) 10/15/14   Right inferior and superior pubic rami  . HOH (hard of hearing)    Wears bilateral hearing aids  . Hyperlipidemia   . Hypertension   . Macular degeneration   . Memory deficit   . Osteoporosis   . Pain in right foot   . Palpitations   . Paroxysmal atrial fibrillation (HCC)   . Thyroid nodule    Status post surgery  . Ulcer of hard palate   . Vertigo    Past Surgical History:  Procedure Laterality Date  . ABDOMINAL HYSTERECTOMY  1968  . APPENDECTOMY  1940's  . COLONOSCOPY    . FOOT SURGERY Left   . HARDWARE REMOVAL Right 02/16/2016   Procedure: HARDWARE REMOVAL RT HIP;  Surgeon: Sheral Apley, MD;  [facility];  Service: Orthopedics;  Laterality: Right;  . HIP ARTHROPLASTY Right 02/16/2016   Procedure: ARTHROPLASTY BIPOLAR HIP (HEMIARTHROPLASTY);  Surgeon: Sheral Apley, MD;  [facility] OR;  Service: Orthopedics;  Laterality: Right;  . INTRAMEDULLARY (IM) NAIL INTERTROCHANTERIC Right 09/11/2015   Procedure: INTRAMEDULLARY (IM) NAIL INTERTROCHANTRIC;  Surgeon: Sheral Apley, MD;  Location: [facility] OR;  Service: Orthopedics;  Laterality: Right;  . KNEE SURGERY      Allergies  Allergen Reactions  . Benadryl [Diphenhydramine] Other (See Comments)    Muscular degeneration  . Penicillins  Other (See Comments)    Has patient had a PCN reaction causing immediate rash, facial/tongue/throat swelling, SOB or lightheadedness with hypotension: Unknown Has patient had a PCN reaction causing severe rash involving mucus membranes or skin necrosis: Unknown Has patient had a PCN reaction that required hospitalization: Unknown Has patient had a PCN reaction occurring within the last 10 years: No If all of the above answers are "NO", then may proceed with Cephalosporin use.      Outpatient Encounter Medications as of 02/26/2018  Medication Sig  . acetaminophen (TYLENOL) 500 MG tablet Take 500 mg by mouth every 6 (six) hours as needed (for pain, headaches, or fever).   Marland Kitchen. aspirin 325 MG tablet Take 325 mg by mouth daily.  Marland Kitchen. atorvastatin (LIPITOR) 10 MG tablet Take 10 mg by mouth daily.  . Biotin 5409810000 MCG TABS Take 1 tablet by mouth daily.  . calcium carbonate (OSCAL) 1500 (600 Ca) MG TABS tablet Take 600 mg of elemental calcium by mouth 2 (two) times daily with a meal.  . Cholecalciferol (VITAMIN D3) 2000 units TABS Take 2,000 Units by mouth daily.  Marland Kitchen. docusate sodium (COLACE) 100 MG capsule Take 100 mg by mouth 2 (two) times daily.   Marland Kitchen. EPINEPHrine 0.3 mg/0.3 mL IJ SOAJ injection Inject 0.3 mLs (0.3 mg total) into the muscle once as needed (anaphylaxis/allergic reaction).  . ferrous gluconate (FERGON) 324 MG tablet Take 1 tablet (324 mg total) by mouth daily with breakfast.  . meclizine (ANTIVERT) 12.5 MG tablet Take 12.5 mg by mouth 2 (two) times daily.   . Multiple Vitamin (MULTIVITAMIN WITH MINERALS) TABS Take 1 tablet by mouth daily.  . Multiple Vitamins-Minerals (PRESERVISION AREDS 2 PO) Take 2 capsules by mouth daily.   . polyethylene glycol (MIRALAX / GLYCOLAX) packet Take 17 g by mouth every other day.   No facility-administered encounter medications on file as of 02/26/2018.    ROS was provided with assistance of staff Review of Systems  Constitutional: Positive for fatigue. Negative for activity change, appetite change, chills, diaphoresis and fever.  HENT: Positive for hearing loss. Negative for trouble swallowing and voice change.   Respiratory: Negative for cough, shortness of breath and wheezing.   Cardiovascular: Negative for chest pain, palpitations and leg swelling.  Gastrointestinal: Positive for abdominal pain. Negative for abdominal distention, constipation, diarrhea, nausea and vomiting.  Genitourinary: Positive for frequency. Negative for  difficulty urinating, dysuria, hematuria and urgency.       Incontinent of urine.   Musculoskeletal: Positive for gait problem.  Skin: Negative for color change and pallor.  Neurological: Negative for dizziness, speech difficulty, weakness and headaches.       Memory recall difficulty.   Psychiatric/Behavioral: Positive for confusion. Negative for agitation, behavioral problems, hallucinations and sleep disturbance. The patient is not nervous/anxious.     Immunization History  Administered Date(s) Administered  . Influenza-Unspecified 09/07/2016, 09/12/2017  . PPD Test 02/18/2016  . Tdap 10/07/2012   Pertinent  Health Maintenance Due  Topic Date Due  . PNA vac Low Risk Adult (1 of 2 - PCV13) 04/15/1991  . INFLUENZA VACCINE  06/21/2018  . DEXA SCAN  Completed   Fall Risk  08/03/2017 10/24/2014  Falls in the past year? No Yes  Number falls in past yr: - 1  Injury with Fall? - Yes  Risk Factor Category  - High Fall Risk  Risk for fall due to : - History of fall(s)   Functional Status Survey:    Vitals:   02/26/18  1051  BP: 122/60  Pulse: 80  Resp: 18  Temp: 98.4 F (36.9 C)  SpO2: 97%  Weight: 124 lb 8 oz (56.5 kg)  Height: 5\' 1"  (1.549 m)   Body mass index is 23.52 kg/m. Physical Exam  Constitutional: She appears well-developed and well-nourished. No distress.  HENT:  Head: Normocephalic and atraumatic.  Eyes: Pupils are equal, round, and reactive to light. EOM are normal.  Neck: Normal range of motion. Neck supple. No JVD present. No thyromegaly present.  Cardiovascular: Normal rate and regular rhythm.  No murmur heard. Pulmonary/Chest: She has no wheezes. She has no rales.  Abdominal: Bowel sounds are normal. She exhibits no distension and no mass. There is tenderness. There is no rebound and no guarding.  Suprapubic area tenderness when palpated.   Musculoskeletal: Normal range of motion. She exhibits no edema or tenderness.  Neurological: She exhibits normal  muscle tone. Coordination normal.  Skin: Skin is warm and dry. She is not diaphoretic. No pallor.  Psychiatric: She has a normal mood and affect. Her behavior is normal.    Labs reviewed: Recent Labs    01/13/18 2022 01/18/18 02/20/18  NA 132* 135* 135*  K 3.3* 4.3 4.6  CL 99*  --   --   CO2 23  --   --   GLUCOSE 130*  --   --   BUN 14 15 13   CREATININE 0.67 0.7 0.7  CALCIUM 9.6  --   --    Recent Labs    01/04/18 01/18/18 02/20/18  AST 24 28 21   ALT 17 19 14   ALKPHOS 51 57 47   Recent Labs    01/13/18 2022 01/18/18 02/20/18  WBC 6.0 4.9 4.4  HGB 12.1 13.0 11.8*  HCT 35.2* 37 34*  MCV 89.8  --   --   PLT 273 258 269   Lab Results  Component Value Date   TSH 1.69 06/27/2017   No results found for: HGBA1C Lab Results  Component Value Date   CHOL 189 01/04/2018   HDL 67 01/04/2018   LDLCALC 101 01/04/2018   TRIG 117 01/04/2018    Significant Diagnostic Results in last 30 days:  No results found.  Assessment/Plan Incontinent of urine new onset of incontinent of urine, 02/24/18 the patient urinated in her recliner, she stated she forgot to get up to use the restroom. 02/25/18 staff noted the patient's trash was full of soaked pads. She appears more confused than usual. Suprapubic area tenderness was noted upon my examination which the ROS was noted reported by the patient. Obtain UA C/S    Suprapubic tenderness new onset of incontinent of urine, 02/24/18 the patient urinated in her recliner, she stated she forgot to get up to use the restroom. 02/25/18 staff noted the patient's trash was full of soaked pads. She appears more confused than usual. Suprapubic area tenderness was noted upon my examination which the ROS was noted reported by the patient. Obtain UA C/S. White count 02/20/18 was 4.4, the patient has been afebrile.     Urinary frequency It was reported 02/19/18, no further complaint upon my visit today.   Memory deficit Appears more confused, inconsistent with  her HPI and memory recalls, no focal weakness noted. Observe.      Family/ staff Communication: plan of care reviewed with the patient and charge nurse.   Labs/tests ordered:  UA C/S  Time spend 25 minutes.

## 2018-02-26 NOTE — Assessment & Plan Note (Signed)
new onset of incontinent of urine, 02/24/18 the patient urinated in her recliner, she stated she forgot to get up to use the restroom. 02/25/18 staff noted the patient's trash was full of soaked pads. She appears more confused than usual. Suprapubic area tenderness was noted upon my examination which the ROS was noted reported by the patient. Obtain UA C/S

## 2018-03-02 ENCOUNTER — Encounter: Payer: Self-pay | Admitting: Nurse Practitioner

## 2018-03-02 ENCOUNTER — Non-Acute Institutional Stay: Payer: Medicare Other | Admitting: Nurse Practitioner

## 2018-03-02 DIAGNOSIS — N390 Urinary tract infection, site not specified: Secondary | ICD-10-CM | POA: Diagnosis not present

## 2018-03-02 DIAGNOSIS — R413 Other amnesia: Secondary | ICD-10-CM | POA: Diagnosis not present

## 2018-03-02 NOTE — Progress Notes (Signed)
Location:  [facility] Room Number: 822 Place of Service:  ALF [phone]) Provider:  Britanny Marksberry, Manxie  NP  Oneal Grout, MD  Patient Care Team: Oneal Grout, MD as PCP - General (Internal Medicine) Venita Lick, MD as Consulting Physician (Orthopedic Surgery) Marykay Lex, MD as Consulting Physician (Cardiology) Nickolis Diel X, NP as Nurse Practitioner (Internal Medicine)  Extended Emergency Contact Information Primary Emergency Contact: [name],Sidney Address: [address]           [place], [place] [address] [place] Phone: [phone] Relation: Niece Secondary Emergency Contact: [name],[name]  [place] Mobile Phone: [phone] Relation: Friend  Code Status:  DNR Goals of care: Advanced Directive information Advanced Directives 02/19/2018  Does Patient Have a Medical Advance Directive? Yes  Type of Advance Directive Out of facility DNR (pink MOST or yellow form)  Does patient want to make changes to medical advance directive? No - Patient declined  Copy of Healthcare Power of Attorney in Chart? -  Would patient like information on creating a medical advance directive? -  Pre-existing out of facility DNR order (yellow form or pink MOST form) Yellow form placed in chart (order not valid for inpatient use)     Chief Complaint  Patient presents with  . Acute Visit    Positve UTI    HPI:  Pt is a 84 y.o. female seen today for an acute visit for urinary incontinence, frequency, suprapubic area discomfort, 02/27/18 urine culture E Coli >100,000c/ml. She is afebrile, her vital signs stable. The patient appears more forgetful and repetitive, no focal weakness.    Past Medical History:  Diagnosis Date  . Arteriosclerotic cardiovascular disease   . Bee sting reaction 08/23/2016   Tongue swelling and difficulty swallowing /notes 08/23/2016  . Constipation   . Degenerative joint disease of right hip 10/15/14  . Dysrhythmia    Paroxysmal Atrial Fibrillation  . Fracture of multiple pubic rami (HCC) 10/15/14   Right inferior and superior pubic rami  . HOH (hard of hearing)    Wears bilateral hearing aids  . Hyperlipidemia   . Hypertension   . Macular degeneration   . Memory deficit   . Osteoporosis   . Pain in right foot   . Palpitations   . Paroxysmal atrial fibrillation (HCC)   . Thyroid nodule    Status post surgery  . Ulcer of hard palate   . Vertigo    Past Surgical History:  Procedure Laterality Date  . ABDOMINAL HYSTERECTOMY  1968  . APPENDECTOMY  1940's  . COLONOSCOPY    . FOOT SURGERY Left   . HARDWARE REMOVAL Right 02/16/2016   Procedure: HARDWARE REMOVAL RT HIP;  Surgeon: Sheral Apley, MD;  Location: [facility] OR;  Service: Orthopedics;  Laterality: Right;  . HIP ARTHROPLASTY Right 02/16/2016   Procedure: ARTHROPLASTY BIPOLAR HIP (HEMIARTHROPLASTY);  Surgeon: Sheral Apley, MD;  Location: [facility] OR;  Service: Orthopedics;  Laterality: Right;  . INTRAMEDULLARY (IM) NAIL INTERTROCHANTERIC Right 09/11/2015   Procedure: INTRAMEDULLARY (IM) NAIL INTERTROCHANTRIC;  Surgeon: Sheral Apley, MD;  Location: [facility] OR;  Service: Orthopedics;  Laterality: Right;  . KNEE SURGERY      Allergies  Allergen Reactions  . Benadryl [Diphenhydramine] Other (See Comments)    Muscular degeneration  . Penicillins Other (See Comments)    Has patient had a PCN reaction causing immediate rash, facial/tongue/throat swelling, SOB or lightheadedness with hypotension: Unknown Has patient had a PCN reaction causing severe rash  involving mucus membranes or skin necrosis: Unknown Has patient had a PCN reaction that required hospitalization: Unknown Has patient had a PCN reaction occurring within the last 10 years: No If all of the above answers are "NO", then may proceed with Cephalosporin use.     Outpatient Encounter Medications as of 03/02/2018  Medication Sig  . acetaminophen (TYLENOL) 500 MG tablet Take 500 mg by mouth  every 6 (six) hours as needed (for pain, headaches, or fever).   Marland Kitchen. aspirin 325 MG tablet Take 325 mg by mouth daily.  Marland Kitchen. atorvastatin (LIPITOR) 10 MG tablet Take 10 mg by mouth daily.  . Biotin 1610910000 MCG TABS Take 1 tablet by mouth daily.  . calcium carbonate (OSCAL) 1500 (600 Ca) MG TABS tablet Take 600 mg of elemental calcium by mouth 2 (two) times daily with a meal.  . Cholecalciferol (VITAMIN D3) 2000 units TABS Take 2,000 Units by mouth daily.  Marland Kitchen. docusate sodium (COLACE) 100 MG capsule Take 100 mg by mouth 2 (two) times daily.   Marland Kitchen. EPINEPHrine 0.3 mg/0.3 mL IJ SOAJ injection Inject 0.3 mLs (0.3 mg total) into the muscle once as needed (anaphylaxis/allergic reaction).  . ferrous gluconate (FERGON) 324 MG tablet Take 1 tablet (324 mg total) by mouth daily with breakfast.  . meclizine (ANTIVERT) 12.5 MG tablet Take 12.5 mg by mouth 2 (two) times daily.   . Multiple Vitamin (MULTIVITAMIN WITH MINERALS) TABS Take 1 tablet by mouth daily.  . Multiple Vitamins-Minerals (PRESERVISION AREDS 2 PO) Take 2 capsules by mouth daily.   . polyethylene glycol (MIRALAX / GLYCOLAX) packet Take 17 g by mouth every other day.   No facility-administered encounter medications on file as of 03/02/2018.     Review of Systems  Constitutional: Positive for fatigue. Negative for activity change, appetite change, chills, diaphoresis and fever.  HENT: Positive for hearing loss.   Respiratory: Negative for cough, shortness of breath and wheezing.   Cardiovascular: Negative for chest pain and leg swelling.  Gastrointestinal: Negative for abdominal distention, abdominal pain, constipation, diarrhea, nausea and vomiting.       Lower abd discomfort.   Genitourinary: Positive for frequency and urgency. Negative for decreased urine volume, difficulty urinating, dysuria and hematuria.  Musculoskeletal: Positive for gait problem.  Skin: Negative for color change and pallor.  Neurological: Negative for dizziness, speech  difficulty, weakness and headaches.       Memory lapses  Psychiatric/Behavioral: Positive for confusion. Negative for agitation, behavioral problems and sleep disturbance. The patient is not nervous/anxious.     Immunization History  Administered Date(s) Administered  . Influenza-Unspecified 09/07/2016, 09/12/2017  . PPD Test 02/18/2016  . Tdap 10/07/2012   Pertinent  Health Maintenance Due  Topic Date Due  . PNA vac Low Risk Adult (1 of 2 - PCV13) 04/15/1991  . INFLUENZA VACCINE  06/21/2018  . DEXA SCAN  Completed   Fall Risk  08/03/2017 10/24/2014  Falls in the past year? No Yes  Number falls in past yr: - 1  Injury with Fall? - Yes  Risk Factor Category  - High Fall Risk  Risk for fall due to : - History of fall(s)   Functional Status Survey:    Vitals:   03/02/18 1124  BP: 130/70  Pulse: 70  Resp: 20  Temp: 97.6 F (36.4 C)  Weight: 124 lb 8 oz (56.5 kg)  Height: 5\' 1"  (1.549 m)   Body mass index is 23.52 kg/m. Physical Exam  Constitutional: She appears well-developed and well-nourished.  No distress.  Cardiovascular: Normal rate and regular rhythm.  No murmur heard. Pulmonary/Chest: Effort normal and breath sounds normal. She has no wheezes. She has no rales.  Abdominal: Soft. Bowel sounds are normal. She exhibits no distension and no mass. There is no tenderness. There is no rebound and no guarding.  Discomfort noted suprapubic area on palpation.   Musculoskeletal: Normal range of motion. She exhibits no edema or tenderness.  Ambulates with cane.   Neurological: She exhibits normal muscle tone. Coordination normal.  Skin: Skin is warm and dry. She is not diaphoretic.  Psychiatric: She has a normal mood and affect. Her behavior is normal.    Labs reviewed: Recent Labs    01/13/18 2022 01/18/18 02/20/18  NA 132* 135* 135*  K 3.3* 4.3 4.6  CL 99*  --   --   CO2 23  --   --   GLUCOSE 130*  --   --   BUN 14 15 13   CREATININE 0.67 0.7 0.7  CALCIUM 9.6  --    --    Recent Labs    01/04/18 01/18/18 02/20/18  AST 24 28 21   ALT 17 19 14   ALKPHOS 51 57 47   Recent Labs    01/13/18 2022 01/18/18 02/20/18  WBC 6.0 4.9 4.4  HGB 12.1 13.0 11.8*  HCT 35.2* 37 34*  MCV 89.8  --   --   PLT 273 258 269   Lab Results  Component Value Date   TSH 1.69 06/27/2017   No results found for: HGBA1C Lab Results  Component Value Date   CHOL 189 01/04/2018   HDL 67 01/04/2018   LDLCALC 101 01/04/2018   TRIG 117 01/04/2018    Significant Diagnostic Results in last 30 days:  No results found.  Assessment/Plan UTI (urinary tract infection) 02/27/18 urine culture E Coli >100,000c/ml, 03/02/18 Nitrofurantoin 100mg  bid x 7 days. obsreve  Memory deficit  The patient appears more forgetful and repetitive, no focal weakness. Last MMSE 27/30 01/03/18, UTI maybe contributory, observe.        Family/ staff Communication: plan of care reviewed with the patient and charge nurse.   Labs/tests ordered:  none  Time spend 25 minutes.

## 2018-03-02 NOTE — Assessment & Plan Note (Signed)
02/27/18 urine culture E Coli >100,000c/ml, 03/02/18 Nitrofurantoin 100mg  bid x 7 days. obsreve

## 2018-03-02 NOTE — Assessment & Plan Note (Signed)
The patient appears more forgetful and repetitive, no focal weakness. Last MMSE 27/30 01/03/18, UTI maybe contributory, observe.

## 2018-03-14 ENCOUNTER — Emergency Department (HOSPITAL_COMMUNITY): Payer: Medicare Other

## 2018-03-14 ENCOUNTER — Observation Stay (HOSPITAL_COMMUNITY)
Admission: EM | Admit: 2018-03-14 | Discharge: 2018-03-15 | Disposition: A | Discharge status: Home / Self Care | Payer: Medicare Other | Attending: Internal Medicine | Admitting: Internal Medicine

## 2018-03-14 ENCOUNTER — Encounter (HOSPITAL_COMMUNITY): Payer: Self-pay | Admitting: Emergency Medicine

## 2018-03-14 DIAGNOSIS — H8110 Benign paroxysmal vertigo, unspecified ear: Secondary | ICD-10-CM | POA: Insufficient documentation

## 2018-03-14 DIAGNOSIS — I493 Ventricular premature depolarization: Secondary | ICD-10-CM | POA: Insufficient documentation

## 2018-03-14 DIAGNOSIS — E782 Mixed hyperlipidemia: Secondary | ICD-10-CM | POA: Diagnosis not present

## 2018-03-14 DIAGNOSIS — R413 Other amnesia: Secondary | ICD-10-CM | POA: Insufficient documentation

## 2018-03-14 DIAGNOSIS — Z79899 Other long term (current) drug therapy: Secondary | ICD-10-CM | POA: Insufficient documentation

## 2018-03-14 DIAGNOSIS — I480 Paroxysmal atrial fibrillation: Secondary | ICD-10-CM | POA: Diagnosis not present

## 2018-03-14 DIAGNOSIS — Z7982 Long term (current) use of aspirin: Secondary | ICD-10-CM | POA: Diagnosis not present

## 2018-03-14 DIAGNOSIS — F0150 Vascular dementia without behavioral disturbance: Secondary | ICD-10-CM | POA: Diagnosis present

## 2018-03-14 DIAGNOSIS — E785 Hyperlipidemia, unspecified: Secondary | ICD-10-CM | POA: Diagnosis not present

## 2018-03-14 DIAGNOSIS — I259 Chronic ischemic heart disease, unspecified: Secondary | ICD-10-CM | POA: Diagnosis not present

## 2018-03-14 DIAGNOSIS — M810 Age-related osteoporosis without current pathological fracture: Secondary | ICD-10-CM | POA: Diagnosis not present

## 2018-03-14 DIAGNOSIS — K5909 Other constipation: Secondary | ICD-10-CM | POA: Diagnosis not present

## 2018-03-14 DIAGNOSIS — R079 Chest pain, unspecified: Secondary | ICD-10-CM | POA: Diagnosis present

## 2018-03-14 DIAGNOSIS — Z96641 Presence of right artificial hip joint: Secondary | ICD-10-CM | POA: Diagnosis not present

## 2018-03-14 DIAGNOSIS — Z87891 Personal history of nicotine dependence: Secondary | ICD-10-CM | POA: Diagnosis not present

## 2018-03-14 DIAGNOSIS — R0789 Other chest pain: Principal | ICD-10-CM | POA: Insufficient documentation

## 2018-03-14 DIAGNOSIS — I10 Essential (primary) hypertension: Secondary | ICD-10-CM | POA: Insufficient documentation

## 2018-03-14 LAB — CBC WITH DIFFERENTIAL/PLATELET
BASOS PCT: 1 %
Basophils Absolute: 0 10*3/uL (ref 0.0–0.1)
EOS PCT: 3 %
Eosinophils Absolute: 0.2 10*3/uL (ref 0.0–0.7)
HEMATOCRIT: 37 % (ref 36.0–46.0)
Hemoglobin: 13.4 g/dL (ref 12.0–15.0)
Lymphocytes Relative: 18 %
Lymphs Abs: 1.3 10*3/uL (ref 0.7–4.0)
MCH: 32.4 pg (ref 26.0–34.0)
MCHC: 36.2 g/dL — AB (ref 30.0–36.0)
MCV: 89.4 fL (ref 78.0–100.0)
MONOS PCT: 10 %
Monocytes Absolute: 0.7 10*3/uL (ref 0.1–1.0)
NEUTROS ABS: 4.9 10*3/uL (ref 1.7–7.7)
Neutrophils Relative %: 68 %
PLATELETS: 289 10*3/uL (ref 150–400)
RBC: 4.14 MIL/uL (ref 3.87–5.11)
RDW: 13.2 % (ref 11.5–15.5)
WBC: 7.1 10*3/uL (ref 4.0–10.5)

## 2018-03-14 LAB — COMPREHENSIVE METABOLIC PANEL
ALBUMIN: 4.2 g/dL (ref 3.5–5.0)
ALT: 20 U/L (ref 14–54)
ANION GAP: 9 (ref 5–15)
AST: 30 U/L (ref 15–41)
Alkaline Phosphatase: 51 U/L (ref 38–126)
BILIRUBIN TOTAL: 0.8 mg/dL (ref 0.3–1.2)
BUN: 12 mg/dL (ref 6–20)
CALCIUM: 9.7 mg/dL (ref 8.9–10.3)
CO2: 23 mmol/L (ref 22–32)
CREATININE: 0.64 mg/dL (ref 0.44–1.00)
Chloride: 95 mmol/L — ABNORMAL LOW (ref 101–111)
GFR calc Af Amer: >60 mL/min (ref >60)
Glucose, Bld: 95 mg/dL (ref 65–99)
Potassium: 4 mmol/L (ref 3.5–5.1)
Sodium: 127 mmol/L — ABNORMAL LOW (ref 135–145)
Total Protein: 7.3 g/dL (ref 6.5–8.1)

## 2018-03-14 LAB — CBC
HCT: 37.7 % (ref 36.0–46.0)
HEMOGLOBIN: 13.3 g/dL (ref 12.0–15.0)
MCH: 31.4 pg (ref 26.0–34.0)
MCHC: 35.3 g/dL (ref 30.0–36.0)
MCV: 89.1 fL (ref 78.0–100.0)
Platelets: 301 10*3/uL (ref 150–400)
RBC: 4.23 MIL/uL (ref 3.87–5.11)
RDW: 13.1 % (ref 11.5–15.5)
WBC: 7.4 10*3/uL (ref 4.0–10.5)

## 2018-03-14 LAB — LIPID PANEL
Cholesterol: 179 mg/dL (ref 0–200)
HDL: 70 mg/dL
LDL Cholesterol: 95 mg/dL (ref 0–99)
Total CHOL/HDL Ratio: 2.6 ratio
Triglycerides: 69 mg/dL
VLDL: 14 mg/dL (ref 0–40)

## 2018-03-14 LAB — I-STAT TROPONIN, ED: Troponin i, poc: 0.01 ng/mL (ref 0.00–0.08)

## 2018-03-14 LAB — TROPONIN I: Troponin I: <0.03 ng/mL (ref <0.03)

## 2018-03-14 LAB — LIPASE, BLOOD: Lipase: 32 U/L (ref 11–51)

## 2018-03-14 LAB — TSH: TSH: 1.876 u[IU]/mL (ref 0.350–4.500)

## 2018-03-14 LAB — HEMOGLOBIN A1C
Hgb A1c MFr Bld: 5.4 % (ref 4.8–5.6)
Mean Plasma Glucose: 108.28 mg/dL

## 2018-03-14 LAB — MAGNESIUM: Magnesium: 2 mg/dL (ref 1.7–2.4)

## 2018-03-14 MED ORDER — VITAMIN D 1000 UNITS PO TABS
2000.0000 [IU] | ORAL_TABLET | Freq: Every day | ORAL | Status: DC
  Administered 2018-03-15: 2000 [IU] via ORAL
  Filled 2018-03-14: qty 2

## 2018-03-14 MED ORDER — HYDROCODONE-ACETAMINOPHEN 5-325 MG PO TABS: 1.0000 | ORAL_TABLET | ORAL | Status: DC | PRN

## 2018-03-14 MED ORDER — FERROUS GLUCONATE 324 (38 FE) MG PO TABS
324.0000 mg | ORAL_TABLET | Freq: Every day | ORAL | Status: DC
  Administered 2018-03-15: 324 mg via ORAL
  Filled 2018-03-14: qty 1

## 2018-03-14 MED ORDER — ASPIRIN 325 MG PO TABS: 325.0000 mg | ORAL_TABLET | Freq: Once | ORAL | Status: DC

## 2018-03-14 MED ORDER — ADULT MULTIVITAMIN W/MINERALS CH
ORAL_TABLET | Freq: Every day | ORAL | Status: DC
  Administered 2018-03-15: 1 via ORAL
  Filled 2018-03-14: qty 1

## 2018-03-14 MED ORDER — BISACODYL 5 MG PO TBEC: 5.0000 mg | DELAYED_RELEASE_TABLET | Freq: Every day | ORAL | Status: DC | PRN

## 2018-03-14 MED ORDER — NITROGLYCERIN 0.4 MG SL SUBL
0.4000 mg | SUBLINGUAL_TABLET | SUBLINGUAL | Status: DC | PRN
  Administered 2018-03-14: 0.4 mg via SUBLINGUAL
  Filled 2018-03-14: qty 1

## 2018-03-14 MED ORDER — ONDANSETRON HCL 4 MG/2ML IJ SOLN: 4.0000 mg | Freq: Four times a day (QID) | INTRAMUSCULAR | Status: DC | PRN

## 2018-03-14 MED ORDER — ACETAMINOPHEN 650 MG RE SUPP: 650.0000 mg | Freq: Four times a day (QID) | RECTAL | Status: DC | PRN

## 2018-03-14 MED ORDER — GI COCKTAIL ~~LOC~~
30.0000 mL | Freq: Once | ORAL | Status: AC
  Administered 2018-03-14: 30 mL via ORAL
  Filled 2018-03-14: qty 30

## 2018-03-14 MED ORDER — ATORVASTATIN CALCIUM 10 MG PO TABS
10.0000 mg | ORAL_TABLET | Freq: Every day | ORAL | Status: DC
  Administered 2018-03-14: 10 mg via ORAL
  Filled 2018-03-14: qty 1

## 2018-03-14 MED ORDER — ONDANSETRON HCL 4 MG PO TABS: 4.0000 mg | ORAL_TABLET | Freq: Four times a day (QID) | ORAL | Status: DC | PRN

## 2018-03-14 MED ORDER — POLYETHYLENE GLYCOL 3350 17 G PO PACK: 17.0000 g | PACK | ORAL | Status: DC

## 2018-03-14 MED ORDER — ASPIRIN 325 MG PO TABS
325.0000 mg | ORAL_TABLET | Freq: Every day | ORAL | Status: DC
  Administered 2018-03-15: 325 mg via ORAL
  Filled 2018-03-14: qty 1

## 2018-03-14 MED ORDER — SODIUM CHLORIDE 0.9 % IV SOLN: INTRAVENOUS | Status: AC

## 2018-03-14 MED ORDER — CALCIUM CARBONATE 1250 (500 CA) MG PO TABS
500.0000 mg | ORAL_TABLET | Freq: Two times a day (BID) | ORAL | Status: DC
  Administered 2018-03-15: 500 mg via ORAL
  Filled 2018-03-14: qty 1

## 2018-03-14 MED ORDER — MECLIZINE HCL 25 MG PO TABS
12.5000 mg | ORAL_TABLET | Freq: Two times a day (BID) | ORAL | Status: DC
  Administered 2018-03-14 – 2018-03-15 (×2): 12.5 mg via ORAL
  Filled 2018-03-14 (×2): qty 1

## 2018-03-14 MED ORDER — NITROGLYCERIN 2 % TD OINT: 0.5000 [in_us] | TOPICAL_OINTMENT | Freq: Four times a day (QID) | TRANSDERMAL | Status: AC

## 2018-03-14 MED ORDER — HYDRALAZINE HCL 20 MG/ML IJ SOLN: 10.0000 mg | Freq: Once | INTRAMUSCULAR | Status: DC

## 2018-03-14 MED ORDER — SENNOSIDES-DOCUSATE SODIUM 8.6-50 MG PO TABS: 1.0000 | ORAL_TABLET | Freq: Every evening | ORAL | Status: DC | PRN

## 2018-03-14 MED ORDER — BIOTIN 10000 MCG PO TABS: 10000.0000 ug | ORAL_TABLET | Freq: Every day | ORAL | Status: DC

## 2018-03-14 MED ORDER — DOCUSATE SODIUM 100 MG PO CAPS
100.0000 mg | ORAL_CAPSULE | Freq: Two times a day (BID) | ORAL | Status: DC
  Administered 2018-03-14 – 2018-03-15 (×2): 100 mg via ORAL
  Filled 2018-03-14: qty 1

## 2018-03-14 MED ORDER — HEPARIN SODIUM (PORCINE) 5000 UNIT/ML IJ SOLN
5000.0000 [IU] | Freq: Three times a day (TID) | INTRAMUSCULAR | Status: DC
  Administered 2018-03-14: 5000 [IU] via SUBCUTANEOUS
  Filled 2018-03-14: qty 1

## 2018-03-14 MED ORDER — ACETAMINOPHEN 325 MG PO TABS: 650.0000 mg | ORAL_TABLET | Freq: Four times a day (QID) | ORAL | Status: DC | PRN

## 2018-03-14 NOTE — H&P (Signed)
History and Physical    Candice Willis:[id number] DOB: 1924/02/13 DOA: 03/14/2018  PCP: Oneal Grout, MD Patient coming from: Nursing home  Chief Complaint: Chest Pain  HPI: Candice Willis is a 84 y.o. female with medical history significant of osteoarthritis, osteoporosis, hearing difficulty, benign positional vertigo, hyperlipidemia paroxysmal atrial fibrillation comes to the hospital for evaluation of chest pain.  Patient resides at a nursing home and was at her dental appointment earlier today when she started developing chest pain while waiting in the waiting area.  She describes her pressure-like chest pain located substernally almost in epigastric region at rest.  It started off 1/10 in intensity and then over the course of a few minutes it intensified to 6/10.  Due to this progression of this pain she decided to seek medical attention there and they sent her to the ER for further evaluation.  Patient denied any other associated signs and symptoms such as shortness of breath, radiation of this pain, palpitations, lightheadedness, dizziness.  Denies having similar symptoms in the past.  Reports that since being in the ER she had this episode once more but does not really know what resolves it.  She admits of medication compliance.  In the ER patient was symptom-free.  On the EKG it showed infrequent PVC otherwise no acute ST-T changes.  Her blood pressure was slightly elevated and she had received nitro.  Aspirin was ordered as well.  Due to the nature of the pain ER had consulted cardiology (awaiting call back).  Medical team was asked to admit the patient for overnight observation.   Review of Systems: As per HPI otherwise 10 point review of systems negative.  Review of Systems Otherwise negative except as per HPI, including: General: Denies fever, chills, night sweats or unintended weight loss. Resp: Denies cough, wheezing, shortness of breath. Cardiac: Denies  palpitations,  orthopnea, paroxysmal nocturnal dyspnea. GI: Denies abdominal pain, nausea, vomiting, diarrhea or constipation GU: Denies dysuria, frequency, hesitancy or incontinence MS: Denies muscle aches, joint pain or swelling Neuro: Denies headache, neurologic deficits (focal weakness, numbness, tingling), abnormal gait Psych: Denies anxiety, depression, SI/HI/AVH Skin: Denies new rashes or lesions ID: Denies sick contacts, exotic exposures, travel  Past Medical History:  Diagnosis Date  . Arteriosclerotic cardiovascular disease   . Bee sting reaction 08/23/2016   Tongue swelling and difficulty swallowing /notes 08/23/2016  . Constipation   . Degenerative joint disease of right hip 10/15/14  . Dysrhythmia    Paroxysmal Atrial Fibrillation  . Fracture of multiple pubic rami (HCC) 10/15/14   Right inferior and superior pubic rami  . HOH (hard of hearing)    Wears bilateral hearing aids  . Hyperlipidemia   . Hypertension   . Macular degeneration   . Memory deficit   . Osteoporosis   . Pain in right foot   . Palpitations   . Paroxysmal atrial fibrillation (HCC)   . Thyroid nodule    Status post surgery  . Ulcer of hard palate   . Vertigo     Past Surgical History:  Procedure Laterality Date  . ABDOMINAL HYSTERECTOMY  1968  . APPENDECTOMY  1940's  . COLONOSCOPY    . FOOT SURGERY Left   . HARDWARE REMOVAL Right 02/16/2016   Procedure: HARDWARE REMOVAL RT HIP;  Surgeon: Sheral Apley, MD;  Location: [facility] OR;  Service: Orthopedics;  Laterality: Right;  . HIP ARTHROPLASTY Right 02/16/2016   Procedure: ARTHROPLASTY BIPOLAR HIP (HEMIARTHROPLASTY);  Surgeon: Sheral Apley, MD;  Location: [facility] OR;  Service: Orthopedics;  Laterality: Right;  . INTRAMEDULLARY (IM) NAIL INTERTROCHANTERIC Right 09/11/2015   Procedure: INTRAMEDULLARY (IM) NAIL INTERTROCHANTRIC;  Surgeon: Sheral Apleyimothy D Murphy, MD;  Location: [facility] OR;  Service: Orthopedics;  Laterality: Right;  . KNEE SURGERY      SOCIAL HISTORY:   reports that she quit smoking about 46 years ago. Her smoking use included cigarettes. She smoked 0.50 packs per day. She has never used smokeless tobacco. She reports that she drinks alcohol. She reports that she does not use drugs.  Allergies  Allergen Reactions  . Benadryl [Diphenhydramine] Other (See Comments)    Muscular degeneration  . Penicillins Other (See Comments)    Patient doesn't recall reaction Has patient had a PCN reaction causing immediate rash, facial/tongue/throat swelling, SOB or lightheadedness with hypotension: Unk Has patient had a PCN reaction causing severe rash involving mucus membranes or skin necrosis: Unk Has patient had a PCN reaction that required hospitalization: Unk Has patient had a PCN reaction occurring within the last 10 years: No If all of the above answers are "NO", then may proceed with Cephalosporin use.     FAMILY HISTORY: Family History  Problem Relation Age of Onset  . Heart disease Father   . Cancer Father        prostate  . Cancer Maternal Grandmother        colon  . Heart disease Paternal Grandmother   . Hyperlipidemia Sister   . Cancer Sister        breast  . Heart disease Brother   . Hyperlipidemia Brother   . Cancer Brother        prostate     Prior to Admission medications   Medication Sig Start Date End Date Taking? Authorizing Provider  acetaminophen (TYLENOL) 500 MG tablet Take 500 mg by mouth every 6 (six) hours as needed (for pain, headaches, or fever).    Yes [provider]  aspirin 325 MG tablet Take 325 mg by mouth daily.   Yes [provider]  atorvastatin (LIPITOR) 10 MG tablet Take 10 mg by mouth daily.   Yes [provider]  Biotin 6578410000 MCG TABS Take 10,000 mcg by mouth daily.    Yes [provider]  calcium carbonate (OSCAL) 1500 (600 Ca) MG TABS tablet Take 600 mg of elemental calcium by mouth 2 (two) times daily with a meal.   Yes [provider]  Cholecalciferol  (VITAMIN D3) 2000 units TABS Take 2,000 Units by mouth daily.   Yes [provider]  docusate sodium (COLACE) 100 MG capsule Take 100 mg by mouth 2 (two) times daily.    Yes [provider]  EPINEPHrine 0.3 mg/0.3 mL IJ SOAJ injection Inject 0.3 mLs (0.3 mg total) into the muscle once as needed (anaphylaxis/allergic reaction). 08/24/16  Yes Diallo, Abdoulaye, MD  ferrous gluconate (FERGON) 324 MG tablet Take 1 tablet (324 mg total) by mouth daily with breakfast. 09/14/15  Yes Vann, Jessica U, DO  meclizine (ANTIVERT) 12.5 MG tablet Take 12.5 mg by mouth 2 (two) times daily.    Yes [provider]  Multiple Vitamins-Minerals (PRESERVISION AREDS 2 PO) Take 2 capsules by mouth daily.    Yes [provider]  Multiple Vitamins-Minerals (THEREMS-M) TABS Take 1 tablet by mouth daily.   Yes [provider]  polyethylene glycol (MIRALAX / GLYCOLAX) packet Take 17 g by mouth every other day. HOLD FOR LOOSE STOOLS   Yes [provider]  Physical Exam: Vitals:   03/14/18 1445 03/14/18 1446 03/14/18 1500 03/14/18 1530  BP: (!) 196/72 (!) 196/72 (!) 195/83 (!) 181/83  Pulse: 64 71 70 69  Resp: 15 17 18  (!) 23  Temp:  97.9 F (36.6 C)    TempSrc:  Oral    SpO2: 99% 95% 97% 97%      Constitutional: NAD, calm, comfortable, elderly frail-appearing.  Difficulty hearing, hearing aid in place Eyes: PERRL, lids and conjunctivae normal ENMT: Mucous membranes are moist. Posterior pharynx clear of any exudate or lesions.Normal dentition.  Neck: normal, supple, no masses, no thyromegaly Respiratory: clear to auscultation bilaterally, no wheezing, no crackles. Normal respiratory effort. No accessory muscle use.  Cardiovascular: Regular rate and rhythm, no murmurs / rubs / gallops. No extremity edema. 2+ pedal pulses. No carotid bruits.  Abdomen: no tenderness, no masses palpated. No hepatosplenomegaly. Bowel sounds positive.  Musculoskeletal: no clubbing /  cyanosis. No joint deformity upper and lower extremities. Good ROM, no contractures. Normal muscle tone.  Skin: no rashes, lesions, ulcers. No induration Neurologic: CN 2-12 grossly intact. Sensation intact, DTR normal. Strength 5/5 in all 4.  Psychiatric: Slight memory deficit but overall fairly sharp for her age.  Good judgment overall.    Labs on Admission: I have personally reviewed following labs and imaging studies  CBC: Recent Labs  Lab 03/14/18 1533  WBC 7.1  NEUTROABS 4.9  HGB 13.4  HCT 37.0  MCV 89.4  PLT 289   Basic Metabolic Panel: Recent Labs  Lab 03/14/18 1533  NA 127*  K 4.0  CL 95*  CO2 23  GLUCOSE 95  BUN 12  CREATININE 0.64  CALCIUM 9.7   GFR: Estimated Creatinine Clearance: 34.6 mL/min (by C-G formula based on SCr of 0.64 mg/dL). Liver Function Tests: Recent Labs  Lab 03/14/18 1533  AST 30  ALT 20  ALKPHOS 51  BILITOT 0.8  PROT 7.3  ALBUMIN 4.2   Recent Labs  Lab 03/14/18 1533  LIPASE 32   No results for input(s): AMMONIA in the last 168 hours. Coagulation Profile: No results for input(s): INR, PROTIME in the last 168 hours. Cardiac Enzymes: No results for input(s): CKTOTAL, CKMB, CKMBINDEX, TROPONINI in the last 168 hours. BNP (last 3 results) No results for input(s): PROBNP in the last 8760 hours. HbA1C: No results for input(s): HGBA1C in the last 72 hours. CBG: No results for input(s): GLUCAP in the last 168 hours. Lipid Profile: No results for input(s): CHOL, HDL, LDLCALC, TRIG, CHOLHDL, LDLDIRECT in the last 72 hours. Thyroid Function Tests: No results for input(s): TSH, T4TOTAL, FREET4, T3FREE, THYROIDAB in the last 72 hours. Anemia Panel: No results for input(s): VITAMINB12, FOLATE, FERRITIN, TIBC, IRON, RETICCTPCT in the last 72 hours. Urine analysis:    Component Value Date/Time   COLORURINE STRAW (A) 01/13/2018 2022   APPEARANCEUR CLEAR 01/13/2018 2022   LABSPEC 1.004 (L) 01/13/2018 2022   PHURINE 7.0 01/13/2018  2022   GLUCOSEU NEGATIVE 01/13/2018 2022   HGBUR NEGATIVE 01/13/2018 2022   BILIRUBINUR NEGATIVE 01/13/2018 2022   KETONESUR NEGATIVE 01/13/2018 2022   PROTEINUR NEGATIVE 01/13/2018 2022   NITRITE POSITIVE (A) 01/13/2018 2022   LEUKOCYTESUR TRACE (A) 01/13/2018 2022   Sepsis Labs: !!!!!!!!!!!!!!!!!!!!!!!!!!!!!!!!!!!!!!!!!!!! @LABRCNTIP (procalcitonin:4,lacticidven:4) )No results found for this or any previous visit (from the past 240 hour(s)).   Radiological Exams on Admission: Dg Chest 2 View  Result Date: 03/14/2018 CLINICAL DATA:  Acute onset chest and epigastric pain today. EXAM: CHEST - 2 VIEW COMPARISON:  Single-view  of the chest 09/10/2015. FINDINGS: The lungs are clear. Heart size is normal. No pneumothorax or pleural effusion. Aortic atherosclerosis is noted. No acute bony abnormality. IMPRESSION: No acute disease. Atherosclerosis. Electronically Signed   By: Drusilla Kanner M.D.   On: 03/14/2018 16:36     All images have been reviewed by me personally.  EKG: Independently reviewed.  Normal sinus rhythm without any acute ischemic changes.  Infrequent PVCs  Assessment/Plan Principal Problem:   Chest pain Active Problems:   Hyperlipidemia   Essential hypertension   Osteoporosis without current pathological fracture   Chronic constipation   Memory deficit   PAF (paroxysmal atrial fibrillation) (HCC)   PVC's (premature ventricular contractions)   Benign paroxysmal positional vertigo   Atypical chest pain concerning for unstable angina -Admit the patient for chest pain workup -Aspirin ordered.  - EKG shows- normal sinus rhythm with infrequent PVCs -Trend cardiac enzymes, check TSH, A1c and lipid panel for risk stratification -Cardiology consult has been placed in the ER, awaiting callback -Supplemental oxygen as necessary -Workup- we will keep her n.p.o. past midnight in case if she needs any further procedures -Nitro-Nitropaste ordered.  Besides the elevated blood  pressure will order IV hydralazine 10 mg  Echocardiogram in February 2017 showed ejection fraction 60-65% with normal wall motion and grade 2 diastolic dysfunction.  Hyponatremia Mild dehydration - Likely secondary to slight dehydration, she will get normal saline at 75 cc/h for next 12 hours.  Will repeat labs tomorrow morning.  History of paroxysmal atrial fibrillation -She is not on any rate control drugs or anticoagulation.  We will continue to monitor on telemetry.  Benign positional paroxysmal vertigo -She is on meclizine.  Continue this  Hyperlipidemia -Continue statin  Iron deficiency anemia -Continue iron supplements  Hearing difficulty -Hearing aid in place.  Chronic constipation -Bowel regimen as needed  Osteoarthritis/osteoporosis -Pain control  DVT prophylaxis: Heparin subcutaneous Code Status: Full code Family Communication: None at bedside Disposition Plan: To be determined Consults called: Cardiology call has been placed by the ED physician, awaiting callback Admission status: Observation telemetry   Please Note: This patient record was dictated using Dragon software. Chart creation errors have been sought, but may not always have been located. Such creation errors do not reflect on the Standard of Medical Care.   Ankit Joline Maxcy MD [facility] Pager [phone]  If 7PM-7AM, please contact night-coverage [email or web address] Password [facility]  03/14/2018, 6:02 PM

## 2018-03-14 NOTE — ED Triage Notes (Signed)
Pt is from the nursing home and was at the dentist when she started c/o chest pain /epigastric pain , pt received 324 mg asa from ems , pt is very hoh

## 2018-03-14 NOTE — ED Notes (Signed)
POA (Deshanta [name]) of Pt would like an update on pt status. POA lives [place] away. [phone]

## 2018-03-14 NOTE — Consult Note (Signed)
Cardiology Consult    Patient ID: Candice Willis MRN: [id number], DOB/AGE: 82-Sep-1925   Admit date: 03/14/2018 Date of Consult: 03/14/2018  Primary Physician: Oneal Grout, MD Primary Cardiologist: Kristeen Miss, MD Requesting Provider: A. Nelson Chimes, MD  Patient Profile    Candice Willis is a 84 y.o. female with a history of HTN, HL, PAF, PVC's, and BPV, who is being seen today for the evaluation of intermittent chest pressure at the request of Dr. Nelson Chimes.  Past Medical History   Past Medical History:  Diagnosis Date  . Arteriosclerotic cardiovascular disease   . Bee sting reaction 08/23/2016   Tongue swelling and difficulty swallowing /notes 08/23/2016  . Constipation   . Degenerative joint disease of right hip 10/15/14  . Fracture of multiple pubic rami (HCC) 10/15/14   Right inferior and superior pubic rami  . HOH (hard of hearing)    Wears bilateral hearing aids  . Hyperlipidemia   . Hypertension   . Macular degeneration   . Memory deficit   . Osteoporosis   . Pain in right foot   . Palpitations   . Paroxysmal atrial fibrillation (HCC)   . Thyroid nodule    Status post surgery  . Ulcer of hard palate   . Vertigo     Past Surgical History:  Procedure Laterality Date  . ABDOMINAL HYSTERECTOMY  1968  . APPENDECTOMY  1940's  . COLONOSCOPY    . FOOT SURGERY Left   . HARDWARE REMOVAL Right 02/16/2016   Procedure: HARDWARE REMOVAL RT HIP;  Surgeon: Sheral Apley, MD;  Location: [facility] OR;  Service: Orthopedics;  Laterality: Right;  . HIP ARTHROPLASTY Right 02/16/2016   Procedure: ARTHROPLASTY BIPOLAR HIP (HEMIARTHROPLASTY);  Surgeon: Sheral Apley, MD;  Location: [facility] OR;  Service: Orthopedics;  Laterality: Right;  . INTRAMEDULLARY (IM) NAIL INTERTROCHANTERIC Right 09/11/2015   Procedure: INTRAMEDULLARY (IM) NAIL INTERTROCHANTRIC;  Surgeon: Sheral Apley, MD;  Location: [facility] OR;  Service: Orthopedics;  Laterality: Right;  . KNEE SURGERY       Allergies  Allergies    Allergen Reactions  . Benadryl [Diphenhydramine] Other (See Comments)    Muscular degeneration  . Penicillins Other (See Comments)    Patient doesn't recall reaction Has patient had a PCN reaction causing immediate rash, facial/tongue/throat swelling, SOB or lightheadedness with hypotension: Unk Has patient had a PCN reaction causing severe rash involving mucus membranes or skin necrosis: Unk Has patient had a PCN reaction that required hospitalization: Unk Has patient had a PCN reaction occurring within the last 10 years: No If all of the above answers are "NO", then may proceed with Cephalosporin use.     History of Present Illness    84 y/o ? with a h/o HTN, HL, PAF (remote - not on OAC), palpitations, PVC's, and BPV.  She was previously followed by Dr. Elease Hashimoto for difficult to manage HTN - last seen in clinic in 06/2016.  [facility] lives in [facility] and generally does pretty well.  She says that she is very active, and does not typically experience chest pain, dyspnea, presyncope, or syncope.  She was in her usoh until today, when she went to the dentist for a routine cleaning and developed mild, right sided, chest pressure, lasting about 4 seconds, and resolving spontaneously.  This recurred within a few seconds to minutes.  B/c of recurrence, her teeth cleaning was cancelled and she was advised to present to the ED.  She also developed significant head pressure, which has  since resolved.  Here, ECG notable for frequent PVC's, which is not new.  These have been asymptomatic.  She has been hypertensive, coming in @ 196/72.  She has had some recurrence of brief episodes of chest pain, but overall is feeling better.  Trop nl so far.  Cardiology asked to eval.  Inpatient Medications    . [START ON 03/15/2018] aspirin  325 mg Oral Daily  . aspirin  325 mg Oral Once  . atorvastatin  10 mg Oral Daily  . Biotin  10,000 mcg Oral Daily  . calcium carbonate  600 mg of elemental calcium Oral BID WC  .  [START ON 03/15/2018] cholecalciferol  2,000 Units Oral Daily  . docusate sodium  100 mg Oral BID  . [START ON 03/15/2018] ferrous gluconate  324 mg Oral Q breakfast  . heparin  5,000 Units Subcutaneous Q8H  . hydrALAZINE  10 mg Intravenous Once  . meclizine  12.5 mg Oral BID  . nitroGLYCERIN  0.5 inch Topical Q6H  . [START ON 03/15/2018] polyethylene glycol  17 g Oral QODAY  . THEREMS-M  1 tablet Oral Daily    Family History    Family History  Problem Relation Age of Onset  . Heart disease Father   . Cancer Father        prostate  . Cancer Maternal Grandmother        colon  . Heart disease Paternal Grandmother   . Hyperlipidemia Sister   . Cancer Sister        breast  . Heart disease Brother   . Hyperlipidemia Brother   . Cancer Brother        prostate   indicated that her mother is deceased. She indicated that her father is deceased. She indicated that the status of her sister is unknown. She indicated that the status of her brother is unknown. She indicated that her maternal grandmother is deceased. She indicated that her maternal grandfather is deceased. She indicated that her paternal grandmother is deceased.   Social History    Social History   Socioeconomic History  . Marital status: Single    Spouse name: Not on file  . Number of children: 0  . Years of education: 16+  . Highest education level: Not on file  Occupational History  . Occupation: Retired   Engineer, production  . Financial resource strain: Not on file  . Food insecurity:    Worry: Not on file    Inability: Not on file  . Transportation needs:    Medical: Not on file    Non-medical: Not on file  Tobacco Use  . Smoking status: Former Smoker    Packs/day: 0.50    Types: Cigarettes    Last attempt to quit: 10/05/1971    Years since quitting: 46.4  . Smokeless tobacco: Never Used  Substance and Sexual Activity  . Alcohol use: Yes    Comment: 1-4 per week; eats 10 raisins daily soaked in Gin  . Drug  use: No  . Sexual activity: Never  Lifestyle  . Physical activity:    Days per week: Not on file    Minutes per session: Not on file  . Stress: Not on file  Relationships  . Social connections:    Talks on phone: Not on file    Gets together: Not on file    Attends religious service: Not on file    Active member of club or organization: Not on file    Attends  meetings of clubs or organizations: Not on file    Relationship status: Not on file  . Intimate partner violence:    Fear of current or ex partner: Not on file    Emotionally abused: Not on file    Physically abused: Not on file    Forced sexual activity: Not on file  Other Topics Concern  . Not on file  Social History Narrative   Lives at [facility]. Moved to [place] 07/06/16   Never married   Caffeine use: 2 cups coffee per day   No tea/soda   Former smoker 1972   Alcohol 1-4 per week. Eats 10 raisins daily soaked in Gin   DNR     Review of Systems    General:  No chills, fever, night sweats or weight changes.  Cardiovascular:  +++ right sided chest pain, no dyspnea on exertion, edema, orthopnea, palpitations, paroxysmal nocturnal dyspnea. Dermatological: No rash, lesions/masses Respiratory: No cough, dyspnea Urologic: No hematuria, dysuria Abdominal:   No nausea, vomiting, diarrhea, bright red blood per rectum, melena, or hematemesis Neurologic:  +++ headache today.  No visual changes, wkns, changes in mental status. All other systems reviewed and are otherwise negative except as noted above.  Physical Exam    Blood pressure (!) 181/83, pulse 69, temperature 97.9 F (36.6 C), temperature source Oral, resp. rate (!) 23, SpO2 97 %.  General: Pleasant, NAD Psych: Normal affect. Neuro: Alert and oriented X 3. Mildly confused. Moves all extremities spontaneously. HEENT: Normal  Neck: Supple without bruits or JVD. Lungs:  Resp regular and unlabored, CTA. Heart: Irreg w/ frequent ectopy. no s3, s4, or  murmurs. Abdomen: Soft, non-tender, non-distended, BS + x 4.  Extremities: No clubbing, cyanosis or edema. DP/PT/Radials 2+ and equal bilaterally.  Labs    Troponin [facility] Test) Recent Labs    03/14/18 1535  TROPIPOC 0.01    Lab Results  Component Value Date   WBC 7.1 03/14/2018   HGB 13.4 03/14/2018   HCT 37.0 03/14/2018   MCV 89.4 03/14/2018   PLT 289 03/14/2018    Recent Labs  Lab 03/14/18 1533  NA 127*  K 4.0  CL 95*  CO2 23  BUN 12  CREATININE 0.64  CALCIUM 9.7  PROT 7.3  BILITOT 0.8  ALKPHOS 51  ALT 20  AST 30  GLUCOSE 95   Lab Results  Component Value Date   CHOL 189 01/04/2018   HDL 67 01/04/2018   LDLCALC 101 01/04/2018   TRIG 117 01/04/2018    Radiology Studies    Dg Chest 2 View  Result Date: 03/14/2018 CLINICAL DATA:  Acute onset chest and epigastric pain today. EXAM: CHEST - 2 VIEW COMPARISON:  Single-view of the chest 09/10/2015. FINDINGS: The lungs are clear. Heart size is normal. No pneumothorax or pleural effusion. Aortic atherosclerosis is noted. No acute bony abnormality. IMPRESSION: No acute disease. Atherosclerosis. Electronically Signed   By: Drusilla Kanner M.D.   On: 03/14/2018 16:36    ECG & Cardiac Imaging    RSR, 60, 1st deg avb, freq pvc's  Assessment & Plan    1.  Atypical Chest Pain:  Pt w/ h/o HTN/HL/PVCs, presented to ED today after developing focal, right sided chest pressure, lasting about 4 secs per episode, resolving spontaneously, and recurring every few seconds/minutes.  ECG w/ freq PVCs but no acute ST/T changes.  Trop nl.  Currently pain free.  Agree w/ admit/r/o.  Provided that trops remain nl, would not  pursue additional ischemic eval.  2.  Essential HTN:  Prev on amlodipine 5 and lisinopril 5 in late 2017.  Does not appear that she is on any antihypertensives @ home currently. Not clear why meds might have been discontinued.  3.  PAF:  Unclear history. Not on OAC.  In sinus.  Follow on tele.  4.  PVCs:   Chronic, asymptomatic. Lytes stable.  Signed, Nicolasa Duckinghristopher Edi Gorniak, NP 03/14/2018, 6:23 PM  For questions or updates, please contact   Please consult [email or web address] for contact info under Cardiology/STEMI.

## 2018-03-14 NOTE — ED Provider Notes (Addendum)
[facility] EMERGENCY DEPARTMENT Provider Note   CSN: [id number] Arrival date & time: 03/14/18  1435     History   Chief Complaint Chief Complaint  Patient presents with  . Chest Pain    HPI Candice Willis is a 84 y.o. female.  HPI 84 year old female with extensive past medical history as below including coronary disease, hypertension, hyperlipidemia, A. fib, here with chest pressure.  Patient reportedly was in her usual state of health upon awakening this morning.  She was at the dentist waiting for her visit, when she developed a dull, pressure-like sensation in her substernal area.  The sensation seemed to worsen over the next 10 minutes then has since returned intermittently.  She had a flushed feeling with some sweating at the time.  Denies any shortness of breath.  Denies any nausea or vomiting.  She took an aspirin.  Her pain seems to be lessening in intensity, but continues to intermittently recur.  Denies any palpitations.  Denies any lightheadedness or dizziness.  No recent fevers or chills.  No worsening factors.  Past Medical History:  Diagnosis Date  . Arteriosclerotic cardiovascular disease   . Bee sting reaction 08/23/2016   Tongue swelling and difficulty swallowing /notes 08/23/2016  . Constipation   . Degenerative joint disease of right hip 10/15/14  . Dysrhythmia    Paroxysmal Atrial Fibrillation  . Fracture of multiple pubic rami (HCC) 10/15/14   Right inferior and superior pubic rami  . HOH (hard of hearing)    Wears bilateral hearing aids  . Hyperlipidemia   . Hypertension   . Macular degeneration   . Memory deficit   . Osteoporosis   . Pain in right foot   . Palpitations   . Paroxysmal atrial fibrillation (HCC)   . Thyroid nodule    Status post surgery  . Ulcer of hard palate   . Vertigo     Patient Active Problem List   Diagnosis Date Noted  . UTI (urinary tract infection) 03/02/2018  . Incontinent of urine 02/26/2018  .  Suprapubic tenderness 02/26/2018  . Urinary frequency 02/19/2018  . Fatigue 02/19/2018  . Benign paroxysmal positional vertigo 01/02/2018  . Osteoarthritis of right hip 01/02/2018  . Gait abnormality 12/21/2016  . Anemia, iron deficiency 12/21/2016  . Macular degeneration of both eyes 09/08/2016  . Hearing decreased 09/08/2016  . PVC's (premature ventricular contractions) 01/05/2016  . Hyponatremia 09/13/2015  . PAF (paroxysmal atrial fibrillation) (HCC) 09/11/2015  . Vitamin D deficiency 10/24/2014  . Memory deficit   . Thyroid nodule   . Hyperlipidemia 10/20/2014  . Essential hypertension 10/20/2014  . Osteoporosis without current pathological fracture 10/20/2014  . Chronic constipation 10/20/2014    Past Surgical History:  Procedure Laterality Date  . ABDOMINAL HYSTERECTOMY  1968  . APPENDECTOMY  1940's  . COLONOSCOPY    . FOOT SURGERY Left   . HARDWARE REMOVAL Right 02/16/2016   Procedure: HARDWARE REMOVAL RT HIP;  Surgeon: Sheral Apley, MD;  Location: [facility] OR;  Service: Orthopedics;  Laterality: Right;  . HIP ARTHROPLASTY Right 02/16/2016   Procedure: ARTHROPLASTY BIPOLAR HIP (HEMIARTHROPLASTY);  Surgeon: Sheral Apley, MD;  Location: [facility] OR;  Service: Orthopedics;  Laterality: Right;  . INTRAMEDULLARY (IM) NAIL INTERTROCHANTERIC Right 09/11/2015   Procedure: INTRAMEDULLARY (IM) NAIL INTERTROCHANTRIC;  Surgeon: Sheral Apley, MD;  Location: [facility] OR;  Service: Orthopedics;  Laterality: Right;  . KNEE SURGERY       OB History   None  Home Medications    Prior to Admission medications   Medication Sig Start Date End Date Taking? Authorizing Provider  acetaminophen (TYLENOL) 500 MG tablet Take 500 mg by mouth every 6 (six) hours as needed (for pain, headaches, or fever).     [provider]  aspirin 325 MG tablet Take 325 mg by mouth daily.    [provider]  atorvastatin (LIPITOR) 10 MG tablet Take 10 mg by mouth daily.    [provider]  Biotin 16109 MCG TABS Take 1 tablet by mouth daily.    [provider]  calcium carbonate (OSCAL) 1500 (600 Ca) MG TABS tablet Take 600 mg of elemental calcium by mouth 2 (two) times daily with a meal.    [provider]  Cholecalciferol (VITAMIN D3) 2000 units TABS Take 2,000 Units by mouth daily.    [provider]  docusate sodium (COLACE) 100 MG capsule Take 100 mg by mouth 2 (two) times daily.     [provider]  EPINEPHrine 0.3 mg/0.3 mL IJ SOAJ injection Inject 0.3 mLs (0.3 mg total) into the muscle once as needed (anaphylaxis/allergic reaction). 08/24/16   Diallo, Lilia Argue, MD  ferrous gluconate (FERGON) 324 MG tablet Take 1 tablet (324 mg total) by mouth daily with breakfast. 09/14/15   Joseph Art, DO  meclizine (ANTIVERT) 12.5 MG tablet Take 12.5 mg by mouth 2 (two) times daily.     [provider]  Multiple Vitamin (MULTIVITAMIN WITH MINERALS) TABS Take 1 tablet by mouth daily.    [provider]  Multiple Vitamins-Minerals (PRESERVISION AREDS 2 PO) Take 2 capsules by mouth daily.     [provider]  polyethylene glycol (MIRALAX / GLYCOLAX) packet Take 17 g by mouth every other day.    [provider]    Family History Family History  Problem Relation Age of Onset  . Heart disease Father   . Cancer Father        prostate  . Cancer Maternal Grandmother        colon  . Heart disease Paternal Grandmother   . Hyperlipidemia Sister   . Cancer Sister        breast  . Heart disease Brother   . Hyperlipidemia Brother   . Cancer Brother        prostate    Social History Social History   Tobacco Use  . Smoking status: Former Smoker    Packs/day: 0.50    Types: Cigarettes    Last attempt to quit: 10/05/1971    Years since quitting: 46.4  . Smokeless tobacco: Never Used  Substance Use Topics  . Alcohol use: Yes    Comment: 1-4 per week; eats 10 raisins daily soaked in Gin  . Drug  use: No     Allergies   Benadryl [diphenhydramine] and Penicillins   Review of Systems Review of Systems  Constitutional: Negative for chills, fatigue and fever.  HENT: Negative for congestion and rhinorrhea.   Eyes: Negative for visual disturbance.  Respiratory: Positive for chest tightness. Negative for cough, shortness of breath and wheezing.   Cardiovascular: Positive for chest pain. Negative for leg swelling.  Gastrointestinal: Negative for abdominal pain, diarrhea, nausea and vomiting.  Genitourinary: Negative for dysuria and flank pain.  Musculoskeletal: Negative for neck pain and neck stiffness.  Skin: Negative for rash and wound.  Allergic/Immunologic: Negative for immunocompromised state.  Neurological: Negative for syncope, weakness and headaches.  All other systems reviewed and are negative.  Physical Exam Updated Vital Signs BP (!) 181/83   Pulse 69   Temp 97.9 F (36.6 C) (Oral)   Resp (!) 23   SpO2 97%   Physical Exam  Constitutional: She is oriented to person, place, and time. She appears well-developed and well-nourished. No distress.  HENT:  Head: Normocephalic and atraumatic.  Eyes: Conjunctivae are normal.  Neck: Neck supple.  Cardiovascular: Normal rate, regular rhythm and normal heart sounds. Exam reveals no friction rub.  No murmur heard. Pulmonary/Chest: Effort normal and breath sounds normal. No respiratory distress. She has no wheezes. She has no rales.  No tenderness to palpation over anterior chest wall.  Abdominal: Soft. She exhibits no distension.  No right upper quadrant tenderness.  Negative Murphy's.  Musculoskeletal: She exhibits no edema.  Neurological: She is alert and oriented to person, place, and time. She exhibits normal muscle tone.  Skin: Skin is warm. Capillary refill takes less than 2 seconds.  Psychiatric: She has a normal mood and affect.  Nursing note and vitals reviewed.    ED Treatments / Results  Labs (all  labs ordered are listed, but only abnormal results are displayed) Labs Reviewed  CBC WITH DIFFERENTIAL/PLATELET - Abnormal; Notable for the following components:      Result Value   MCHC 36.2 (*)    All other components within normal limits  COMPREHENSIVE METABOLIC PANEL - Abnormal; Notable for the following components:   Sodium 127 (*)    Chloride 95 (*)    All other components within normal limits  LIPASE, BLOOD  URINALYSIS, ROUTINE W REFLEX MICROSCOPIC  I-STAT TROPONIN, ED    EKG EKG Interpretation  Date/Time:  Wednesday March 14 2018 14:39:42 EDT Ventricular Rate:  60 PR Interval:    QRS Duration: 88 QT Interval:  419 QTC Calculation: 419 R Axis:   53 Text Interpretation:  Sinus rhythm Ventricular trigeminy , new since last tracing Prolonged PR interval Confirmed by Linwood Dibbles [phone]) on 03/14/2018 2:48:29 PM   Radiology Dg Chest 2 View  Result Date: 03/14/2018 CLINICAL DATA:  Acute onset chest and epigastric pain today. EXAM: CHEST - 2 VIEW COMPARISON:  Single-view of the chest 09/10/2015. FINDINGS: The lungs are clear. Heart size is normal. No pneumothorax or pleural effusion. Aortic atherosclerosis is noted. No acute bony abnormality. IMPRESSION: No acute disease. Atherosclerosis. Electronically Signed   By: Drusilla Kanner M.D.   On: 03/14/2018 16:36    Procedures Procedures (including critical care time)  Medications Ordered in ED Medications - No data to display   Initial Impression / Assessment and Plan / ED Course  I have reviewed the triage vital signs and the nursing notes.  Pertinent labs & imaging results that were available during my care of the patient were reviewed by me and considered in my medical decision making (see chart for details).  Clinical Course as of Mar 14 1650  Wed Mar 14, 2018  305 85 year old female here with intermittent chest pressure.  On telemetry, symptoms do seem to be somewhat correlating with very frequent PVCs and  intermittent trigeminy.  Consider symptomatic palpitations, transient arrhythmia, also ACS.  She is not short of breath, not tachypneic, not hypoxic, I do not suspect PE.  Pain seems to come and go and is not consistent with dissection.   [CI]  1522 Reviewed prior records.  Last TTE in 2017 unremarkable.  Patient just saw her PCP on 4/12 and was diagnosed with UTI and given Macrobid.  No available cath reports.   [  CI]  1605 I-Stat Troponin, ED [facility] [CI]  1642 Initial lab work shows mild hyponatremia.  She is without headache, altered mental status, or other complaints from the standpoint.  Can be worked up as an inpatient.  Troponin negative.  Heart score is 6 with intermittent chest pain.  She continues to have intermittent pain associated with PVCs.  Will plan to admit for observation, tele. Pt in agreement.   [CI]    Clinical Course User Index [CI] Shaune PollackIsaacs, Skipper Dacosta, MD    ADDENDUM: D/w Dr. [place] of cards, who will see pt. [facility] Hold on nitro gtt at this time. They will see pt and make formal reccs. No apparent ischemia on EKG.  Final Clinical Impressions(s) / ED Diagnoses   Final diagnoses:  Other chest pain  Frequent PVCs    ED Discharge Orders    None       Shaune PollackIsaacs, Tonji Elliff, MD 03/14/18 1651    Shaune PollackIsaacs, Terena Bohan, MD 03/14/18 [phone]

## 2018-03-14 NOTE — ED Notes (Signed)
Patient transported to X-ray 

## 2018-03-14 NOTE — ED Notes (Signed)
tele

## 2018-03-15 ENCOUNTER — Other Ambulatory Visit: Payer: Self-pay

## 2018-03-15 DIAGNOSIS — E782 Mixed hyperlipidemia: Secondary | ICD-10-CM

## 2018-03-15 DIAGNOSIS — H8110 Benign paroxysmal vertigo, unspecified ear: Secondary | ICD-10-CM | POA: Diagnosis not present

## 2018-03-15 DIAGNOSIS — I10 Essential (primary) hypertension: Secondary | ICD-10-CM | POA: Diagnosis not present

## 2018-03-15 DIAGNOSIS — I493 Ventricular premature depolarization: Secondary | ICD-10-CM | POA: Diagnosis not present

## 2018-03-15 DIAGNOSIS — I259 Chronic ischemic heart disease, unspecified: Secondary | ICD-10-CM | POA: Diagnosis not present

## 2018-03-15 LAB — CBC
HCT: 35.1 % — ABNORMAL LOW (ref 36.0–46.0)
Hemoglobin: 12.2 g/dL (ref 12.0–15.0)
MCH: 30.7 pg (ref 26.0–34.0)
MCHC: 34.8 g/dL (ref 30.0–36.0)
MCV: 88.4 fL (ref 78.0–100.0)
Platelets: 279 10*3/uL (ref 150–400)
RBC: 3.97 MIL/uL (ref 3.87–5.11)
RDW: 12.9 % (ref 11.5–15.5)
WBC: 6.9 10*3/uL (ref 4.0–10.5)

## 2018-03-15 LAB — COMPREHENSIVE METABOLIC PANEL
ALK PHOS: 48 U/L (ref 38–126)
ALT: 18 U/L (ref 14–54)
ANION GAP: 10 (ref 5–15)
AST: 26 U/L (ref 15–41)
Albumin: 3.8 g/dL (ref 3.5–5.0)
BUN: 11 mg/dL (ref 6–20)
CALCIUM: 9.1 mg/dL (ref 8.9–10.3)
CHLORIDE: 100 mmol/L — AB (ref 101–111)
CO2: 22 mmol/L (ref 22–32)
Creatinine, Ser: 0.63 mg/dL (ref 0.44–1.00)
GFR calc non Af Amer: >60 mL/min (ref >60)
Glucose, Bld: 96 mg/dL (ref 65–99)
Potassium: 3.8 mmol/L (ref 3.5–5.1)
SODIUM: 132 mmol/L — AB (ref 135–145)
Total Bilirubin: 0.8 mg/dL (ref 0.3–1.2)
Total Protein: 6.5 g/dL (ref 6.5–8.1)

## 2018-03-15 LAB — GLUCOSE, CAPILLARY: Glucose-Capillary: 98 mg/dL (ref 65–99)

## 2018-03-15 LAB — MRSA PCR SCREENING: MRSA by PCR: NEGATIVE

## 2018-03-15 LAB — MAGNESIUM: MAGNESIUM: 1.9 mg/dL (ref 1.7–2.4)

## 2018-03-15 LAB — TROPONIN I: Troponin I: <0.03 ng/mL (ref <0.03)

## 2018-03-15 MED ORDER — AMLODIPINE BESYLATE 5 MG PO TABS: 5.0000 mg | ORAL_TABLET | Freq: Every day | ORAL | 0 refills | Status: DC

## 2018-03-15 NOTE — Care Management Note (Signed)
Case Management Note  Patient Details  Name: Vincent GrosJoan S Herbst MRN: [id number] Date of Birth: 1924/11/12  Subjective/Objective:    Pt in with chest pain. [facility]                Action/Plan: Pt discharging back to [facility] today. CSW aware. CM signing off.   Expected Discharge Date:  03/15/18               Expected Discharge Plan:  Assisted Living / Rest Home  In-House Referral:  Clinical Social Work  Discharge planning Services     Post Acute Care Choice:    Choice offered to:     DME Arranged:    DME Agency:     HH Arranged:    HH Agency:     Status of Service:  Completed, signed off  If discussed at [employer] Meetings, dates discussed:    Additional Comments:  Kermit BaloKelli F Ozro Russett, RN 03/15/2018, 11:43 AM

## 2018-03-15 NOTE — Discharge Summary (Signed)
Physician Discharge Summary  Candice Willis:[id number] DOB: [birth year] DOA: 03/14/2018  PCP: Oneal Grout, MD  Admit date: 03/14/2018 Discharge date: 03/15/2018  Admitted From: Nursing home-assisted living-assisted living.  [facility] Disposition: Nursing home  Recommendations for Outpatient Follow-up:  1. Follow up with PCP in 1-2 weeks 2. Please obtain BMP/CBC in one week your next doctors visit.  3. Patient started on 5 mg of oral Norvasc to be taken daily for elevated blood pressure 4. Follow-up outpatient with Dr. Elease Hashimoto from cardiology and 4 to 6 weeks  Home Health: None Equipment/Devices: None Discharge Condition: Stable CODE STATUS: Patient wishes to be a full code Diet recommendation: Cardiac diet  Brief/Interim Summary: Candice Willis is a 84 y.o. female with medical history significant of osteoarthritis, osteoporosis, hearing difficulty, benign positional vertigo, hyperlipidemia paroxysmal atrial fibrillation comes to the hospital for evaluation of chest pain.  Patient resides at a nursing home and was at her dental appointment earlier today when she started developing chest pain while waiting in the waiting area.  She describes her pressure-like chest pain located substernally almost in epigastric region at rest.  It started off 1/10 in intensity and then over the course of a few minutes it intensified to 6/10.  Due to this progression of this pain she decided to seek medical attention there and they sent her to the ER for further evaluation.  Patient denied any other associated signs and symptoms such as shortness of breath, radiation of this pain, palpitations, lightheadedness, dizziness.  Denies having similar symptoms in the past.  Reports that since being in the ER she had this episode once more but does not really know what resolves it.  She admits of medication compliance.  In the ER patient was symptom-free.  On the EKG it showed infrequent PVC otherwise no  acute ST-T changes.  Her blood pressure was slightly elevated and she had received nitro.  Aspirin was ordered as well.    Cardiology was consulted in the ER.  Medical team was asked to admit the patient for overnight observation.  Upon admission patient patient's cardiac enzymes remain negative, she remained chest pain-free.  EKG did not show any acute ST-T changes.  Patient was seen by cardiology as well on the hospital who recommended outpatient follow-up otherwise no further inpatient work-up indicated.  At this point patient has reached maximum benefit from his hospital stay therefore stable to be discharged with outpatient follow-up recommendations as  stated above.  No complaints, wishes to go backhome.   HEENT/EYES = negative for pain, redness, loss of vision, double vision, blurred vision, loss of hearing, sore throat, hoarseness, dysphagia Cardiovascular= negative for chest pain, palpitation, murmurs, lower extremity swelling Respiratory/lungs= negative for shortness of breath, cough, hemoptysis, wheezing, mucus production Gastrointestinal= negative for nausea, vomiting,, abdominal pain, melena, hematemesis Genitourinary= negative for Dysuria, Hematuria, Change in Urinary Frequency MSK = Negative for arthralgia, myalgias, Back Pain, Joint swelling  Neurology= Negative for headache, seizures, numbness, tingling  Psychiatry= Negative for anxiety, depression, suicidal and homocidal ideation Allergy/Immunology= Medication/Food allergy as listed  Skin= Negative for Rash, lesions, ulcers, itching  Discharge Diagnoses:  Principal Problem:   Chest pain Active Problems:   Hyperlipidemia   Essential hypertension   Osteoporosis without current pathological fracture   Chronic constipation   Memory deficit   PAF (paroxysmal atrial fibrillation) (HCC)   PVC's (premature ventricular contractions)   Benign paroxysmal positional vertigo   Atypical chest pain concerning for unstable angina;  rsolved -received ASA  while she was here.  - EKG shows- normal sinus rhythm with infrequent PVCs -Cardiac enzymes remain negative.  Hemoglobin A1c was 5.4.  TSH and lipid panel within normal limits -Appreciate cardiology input, no further work-up indicated  Echocardiogram in February 2017 showed ejection fraction 60-65% with normal wall motion and grade 2 diastolic dysfunction.  Hyponatremia Mild dehydration -improved with hydration.  Sodium is 132 today  History of paroxysmal atrial fibrillation -She is not on any rate control drugs or anticoagulation.  We will continue to monitor on telemetry.  Benign positional paroxysmal vertigo -She is on meclizine.  Continue this  Hyperlipidemia -Continue statin  Iron deficiency anemia -Continue iron supplements  Hearing difficulty -Hearing aid in place.  Chronic constipation -Bowel regimen as needed  Osteoarthritis/osteoporosis -Pain control    Discharge Instructions  Discharge Instructions    Call MD for:  difficulty breathing, headache or visual disturbances   Complete by:  As directed    Call MD for:  persistant dizziness or light-headedness   Complete by:  As directed    Diet - low sodium heart healthy   Complete by:  As directed    Increase activity slowly   Complete by:  As directed      Allergies as of 03/15/2018      Reactions   Benadryl [diphenhydramine] Other (See Comments)   Muscular degeneration   Penicillins Other (See Comments)   Patient doesn't recall reaction Has patient had a PCN reaction causing immediate rash, facial/tongue/throat swelling, SOB or lightheadedness with hypotension: Unk Has patient had a PCN reaction causing severe rash involving mucus membranes or skin necrosis: Unk Has patient had a PCN reaction that required hospitalization: Unk Has patient had a PCN reaction occurring within the last 10 years: No If all of the above answers are "NO", then may proceed with Cephalosporin use.       Medication List    TAKE these medications   acetaminophen 500 MG tablet Commonly known as:  TYLENOL Take 500 mg by mouth every 6 (six) hours as needed (for pain, headaches, or fever).   amLODipine 5 MG tablet Commonly known as:  NORVASC Take 1 tablet (5 mg total) by mouth daily.   aspirin 325 MG tablet Take 325 mg by mouth daily.   atorvastatin 10 MG tablet Commonly known as:  LIPITOR Take 10 mg by mouth daily.   Biotin [address] MCG Tabs Take 10,000 mcg by mouth daily.   calcium carbonate 1500 (600 Ca) MG Tabs tablet Commonly known as:  OSCAL Take 600 mg of elemental calcium by mouth 2 (two) times daily with a meal.   docusate sodium 100 MG capsule Commonly known as:  COLACE Take 100 mg by mouth 2 (two) times daily.   EPINEPHrine 0.3 mg/0.3 mL Soaj injection Commonly known as:  EPI-PEN Inject 0.3 mLs (0.3 mg total) into the muscle once as needed (anaphylaxis/allergic reaction).   ferrous gluconate 324 MG tablet Commonly known as:  FERGON Take 1 tablet (324 mg total) by mouth daily with breakfast.   meclizine 12.5 MG tablet Commonly known as:  ANTIVERT Take 12.5 mg by mouth 2 (two) times daily.   polyethylene glycol packet Commonly known as:  MIRALAX / GLYCOLAX Take 17 g by mouth every other day. HOLD FOR LOOSE STOOLS   PRESERVISION AREDS 2 PO Take 2 capsules by mouth daily.   THEREMS-M Tabs Take 1 tablet by mouth daily.   Vitamin D3 2000 units Tabs Take 2,000 Units by mouth daily.  Follow-up Information    Oneal Grout, MD. Schedule an appointment as soon as possible for a visit in 2 week(s).   Specialty:  Internal Medicine Contact information: [address] [place] [address] [phone]        Nahser, Deloris Ping, MD Follow up on 04/06/2018.   Specialty:  Cardiology Why:  at 8:00 AM with his Nurse Practitioner, Lizabeth Leyden Contact information: [address] [place] [address] [phone]           Allergies  Allergen Reactions  . Benadryl [Diphenhydramine] Other (See Comments)    Muscular degeneration  . Penicillins Other (See Comments)    Patient doesn't recall reaction Has patient had a PCN reaction causing immediate rash, facial/tongue/throat swelling, SOB or lightheadedness with hypotension: Unk Has patient had a PCN reaction causing severe rash involving mucus membranes or skin necrosis: Unk Has patient had a PCN reaction that required hospitalization: Unk Has patient had a PCN reaction occurring within the last 10 years: No If all of the above answers are "NO", then may proceed with Cephalosporin use.     You were cared for by a hospitalist during your hospital stay. If you have any questions about your discharge medications or the care you received while you were in the hospital after you are discharged, you can call the unit and asked to speak with the hospitalist on call if the hospitalist that took care of you is not available. Once you are discharged, your primary care physician will handle any further medical issues. Please note that no refills for any discharge medications will be authorized once you are discharged, as it is imperative that you return to your primary care physician (or establish a relationship with a primary care physician if you do not have one) for your aftercare needs so that they can reassess your need for medications and monitor your lab values.  Consultations:  Cardiology   Procedures/Studies: Dg Chest 2 View  Result Date: 03/14/2018 CLINICAL DATA:  Acute onset chest and epigastric pain today. EXAM: CHEST - 2 VIEW COMPARISON:  Single-view of the chest 09/10/2015. FINDINGS: The lungs are clear. Heart size is normal. No pneumothorax or pleural effusion. Aortic atherosclerosis is noted. No acute bony abnormality. IMPRESSION: No acute disease. Atherosclerosis. Electronically Signed   By: Drusilla Kanner M.D.   On: 03/14/2018 16:36       Subjective:   Discharge Exam: Vitals:   03/14/18 2224 03/15/18 0553  BP: (!) 155/57 136/67  Pulse: (!) 55 (!) 57  Resp: 19 18  Temp: 97.7 F (36.5 C) 98.1 F (36.7 C)  SpO2: 98% 97%   Vitals:   03/14/18 2224 03/14/18 2225 03/15/18 0547 03/15/18 0553  BP: (!) 155/57   136/67  Pulse: (!) 55   (!) 57  Resp: 19   18  Temp: 97.7 F (36.5 C)   98.1 F (36.7 C)  TempSrc: Oral   Oral  SpO2: 98%   97%  Weight:  54.2 kg (119 lb 7.8 oz) 54 kg (119 lb)   Height:  5\' 1"  (1.549 m)      General: Pt is alert, awake, not in acute distress, frail appearing.  Difficulty hearing. Cardiovascular: RRR, S1/S2 +, no rubs, no gallops Respiratory: CTA bilaterally, no wheezing, no rhonchi Abdominal: Soft, NT, ND, bowel sounds + Extremities: no edema, no cyanosis    The results of significant diagnostics from this hospitalization (including imaging, microbiology, ancillary and laboratory) are listed below for reference.  Microbiology: Recent Results (from the past 240 hour(s))  MRSA PCR Screening     Status: None   Collection Time: 03/14/18 10:21 PM  Result Value Ref Range Status   MRSA by PCR NEGATIVE NEGATIVE Final    Comment:        The GeneXpert MRSA Assay (FDA approved for NASAL specimens only), is one component of a comprehensive MRSA colonization surveillance program. It is not intended to diagnose MRSA infection nor to guide or monitor treatment for MRSA infections. Performed at [facility], [address] [place], [place] [address]      Labs: BNP (last 3 results) No results for input(s): BNP in the last 8760 hours. Basic Metabolic Panel: Recent Labs  Lab 03/14/18 1533 03/14/18 1725 03/15/18 0315  NA 127*  --  132*  K 4.0  --  3.8  CL 95*  --  100*  CO2 23  --  22  GLUCOSE 95  --  96  BUN 12  --  11  CREATININE 0.64  --  0.63  CALCIUM 9.7  --  9.1  MG  --  2.0 1.9   Liver Function Tests: Recent Labs  Lab 03/14/18 1533 03/15/18 0315   AST 30 26  ALT 20 18  ALKPHOS 51 48  BILITOT 0.8 0.8  PROT 7.3 6.5  ALBUMIN 4.2 3.8   Recent Labs  Lab 03/14/18 1533  LIPASE 32   No results for input(s): AMMONIA in the last 168 hours. CBC: Recent Labs  Lab 03/14/18 1533 03/14/18 1831 03/15/18 0315  WBC 7.1 7.4 6.9  NEUTROABS 4.9  --   --   HGB 13.4 13.3 12.2  HCT 37.0 37.7 35.1*  MCV 89.4 89.1 88.4  PLT 289 301 279   Cardiac Enzymes: Recent Labs  Lab 03/14/18 1725 03/14/18 2303 03/15/18 0315  TROPONINI <0.03 <0.03 <0.03   BNP: Invalid input(s): POCBNP CBG: Recent Labs  Lab 03/15/18 0954  GLUCAP 98   D-Dimer No results for input(s): DDIMER in the last 72 hours. Hgb A1c Recent Labs    03/14/18 1831  HGBA1C 5.4   Lipid Profile Recent Labs    03/14/18 1831  CHOL 179  HDL 70  LDLCALC 95  TRIG 69  CHOLHDL 2.6   Thyroid function studies Recent Labs    03/14/18 1831  TSH 1.876   Anemia work up No results for input(s): VITAMINB12, FOLATE, FERRITIN, TIBC, IRON, RETICCTPCT in the last 72 hours. Urinalysis    Component Value Date/Time   COLORURINE STRAW (A) 01/13/2018 2022   APPEARANCEUR CLEAR 01/13/2018 2022   LABSPEC 1.004 (L) 01/13/2018 2022   PHURINE 7.0 01/13/2018 2022   GLUCOSEU NEGATIVE 01/13/2018 2022   HGBUR NEGATIVE 01/13/2018 2022   BILIRUBINUR NEGATIVE 01/13/2018 2022   KETONESUR NEGATIVE 01/13/2018 2022   PROTEINUR NEGATIVE 01/13/2018 2022   NITRITE POSITIVE (A) 01/13/2018 2022   LEUKOCYTESUR TRACE (A) 01/13/2018 2022   Sepsis Labs Invalid input(s): PROCALCITONIN,  WBC,  LACTICIDVEN Microbiology Recent Results (from the past 240 hour(s))  MRSA PCR Screening     Status: None   Collection Time: 03/14/18 10:21 PM  Result Value Ref Range Status   MRSA by PCR NEGATIVE NEGATIVE Final    Comment:        The GeneXpert MRSA Assay (FDA approved for NASAL specimens only), is one component of a comprehensive MRSA colonization surveillance program. It is not intended to diagnose  MRSA infection nor to guide or monitor treatment for MRSA infections. Performed  at [facility], [address] [place], [place] [address]      Time coordinating discharge: 35 mins  SIGNED:   Dimple Nanas, MD  [facility] 03/15/2018, 12:01 PM Pager   If 7PM-7AM, please contact night-coverage [email or web address] Password [email or web address]

## 2018-03-15 NOTE — Clinical Social Work Note (Signed)
Clinical Social Work Assessment  Patient Details  Name: Candice Willis MRN: [id number] Date of Birth: 09-11-1924  Date of referral:  03/15/18               Reason for consult:  Facility Placement(from [facility])                Permission sought to share information with:  Chartered certified accountant granted to share information::  Yes, Verbal Permission Granted  Name::        Agency::  [facility]  Relationship::     Contact Information:     Housing/Transportation Living arrangements for the past 2 months:  [place] of Information:  Patient, Facility Patient Interpreter Needed:  None Criminal Activity/Legal Involvement Pertinent to Current Situation/Hospitalization:  No - Comment as needed Significant Relationships:  Other Family Members, Friend Lives with:  Facility Resident Do you feel safe going back to the place where you live?  Yes Need for family participation in patient care:  No (Coment)  Care giving concerns: Patient from [facility]. Plan to return there today.   Social Worker assessment / plan: CSW met with patient at bedside. Patient alert and oriented and with pleasant affect. Patient stated she lives at [facility] assisted living. Patient does not have family who live in the area.  CSW spoke to social work at the facility. They are ready for patient to return today and social worker will determine if facility staff can pick patient up. If not, patient will go by [facility].  CSW awaiting discharge summary and will support with discharge back to the facility.  Employment status:  Retired Research officer, political party) PT Recommendations:  Not assessed at this time Information / Referral to community resources:  Other (Comment Required)(back to ALF)  Patient/Family's Response to care: Patient appreciative of care.  Patient/Family's Understanding of and Emotional Response to  Diagnosis, Current Treatment, and Prognosis: Patient agreeable to return to ALF today.  Emotional Assessment Appearance:  Appears stated age Attitude/Demeanor/Rapport:  Engaged Affect (typically observed):  Appropriate, Calm, Pleasant Orientation:  Oriented to Self, Oriented to Place, Oriented to  Time, Oriented to Situation Alcohol / Substance use:  Not Applicable Psych involvement (Current and /or in the community):  No (Comment)  Discharge Needs  Concerns to be addressed:  Discharge Planning Concerns, Care Coordination Readmission within the last 30 days:  No Current discharge risk:  None Barriers to Discharge:  No Barriers Identified   Estanislado Emms, LCSW 03/15/2018, 10:07 AM

## 2018-03-15 NOTE — Progress Notes (Addendum)
Progress Note  Patient Name: Candice GrosJoan S Willis Date of Encounter: 03/15/2018  Primary Cardiologist: Kristeen MissPhilip Nahser, MD   Subjective   No pain, no complaints, worried about her clothes.  Inpatient Medications    Scheduled Meds: . aspirin  325 mg Oral Daily  . aspirin  325 mg Oral Once  . atorvastatin  10 mg Oral q1800  . calcium carbonate  500 mg of elemental calcium Oral BID WC  . cholecalciferol  2,000 Units Oral Daily  . docusate sodium  100 mg Oral BID  . ferrous gluconate  324 mg Oral Q breakfast  . heparin  5,000 Units Subcutaneous Q8H  . hydrALAZINE  10 mg Intravenous Once  . meclizine  12.5 mg Oral BID  . multivitamin with minerals   Oral Daily  . polyethylene glycol  17 g Oral QODAY   Continuous Infusions:  PRN Meds: acetaminophen **OR** acetaminophen, bisacodyl, HYDROcodone-acetaminophen, nitroGLYCERIN, ondansetron **OR** ondansetron (ZOFRAN) IV, senna-docusate   Vital Signs    Vitals:   03/14/18 2224 03/14/18 2225 03/15/18 0547 03/15/18 0553  BP: (!) 155/57   136/67  Pulse: (!) 55   (!) 57  Resp: 19   18  Temp: 97.7 F (36.5 C)   98.1 F (36.7 C)  TempSrc: Oral   Oral  SpO2: 98%   97%  Weight:  119 lb 7.8 oz (54.2 kg) 119 lb (54 kg)   Height:  5\' 1"  (1.549 m)      Intake/Output Summary (Last 24 hours) at 03/15/2018 1103 Last data filed at 03/14/2018 2230 Gross per 24 hour  Intake 240 ml  Output -  Net 240 ml   Filed Weights   03/14/18 2225 03/15/18 0547  Weight: 119 lb 7.8 oz (54.2 kg) 119 lb (54 kg)    Telemetry    SR with 1st degree AV block, + PVCs and PACS and post PVC pause.  - Personally Reviewed  ECG    No new since admit - Personally Reviewed  Physical Exam   GEN: No acute distress.   Neck: No JVD Cardiac: RRR, 1-2/6 systolic murmur, no rubs, or gallops.  Respiratory: Clear to auscultation bilaterally. GI: Soft, nontender, non-distended  MS: No edema; No deformity. Neuro:  Nonfocal  Psych: Normal affect   Labs      Chemistry Recent Labs  Lab 03/14/18 1533 03/15/18 0315  NA 127* 132*  K 4.0 3.8  CL 95* 100*  CO2 23 22  GLUCOSE 95 96  BUN 12 11  CREATININE 0.64 0.63  CALCIUM 9.7 9.1  PROT 7.3 6.5  ALBUMIN 4.2 3.8  AST 30 26  ALT 20 18  ALKPHOS 51 48  BILITOT 0.8 0.8  GFRNONAA >60 >60  GFRAA >60 >60  ANIONGAP 9 10     Hematology Recent Labs  Lab 03/14/18 1533 03/14/18 1831 03/15/18 0315  WBC 7.1 7.4 6.9  RBC 4.14 4.23 3.97  HGB 13.4 13.3 12.2  HCT 37.0 37.7 35.1*  MCV 89.4 89.1 88.4  MCH 32.4 31.4 30.7  MCHC 36.2* 35.3 34.8  RDW 13.2 13.1 12.9  PLT 289 301 279    Cardiac Enzymes Recent Labs  Lab 03/14/18 1725 03/14/18 2303 03/15/18 0315  TROPONINI <0.03 <0.03 <0.03    Recent Labs  Lab 03/14/18 1535  TROPIPOC 0.01    Lipid Panel     Component Value Date/Time   CHOL 179 03/14/2018 1831   TRIG 69 03/14/2018 1831   HDL 70 03/14/2018 1831   CHOLHDL 2.6 03/14/2018 1831  VLDL 14 03/14/2018 1831   LDLCALC 95 03/14/2018 1831    BNPNo results for input(s): BNP, PROBNP in the last 168 hours.   DDimer No results for input(s): DDIMER in the last 168 hours.   Radiology    Dg Chest 2 View  Result Date: 03/14/2018 CLINICAL DATA:  Acute onset chest and epigastric pain today. EXAM: CHEST - 2 VIEW COMPARISON:  Single-view of the chest 09/10/2015. FINDINGS: The lungs are clear. Heart size is normal. No pneumothorax or pleural effusion. Aortic atherosclerosis is noted. No acute bony abnormality. IMPRESSION: No acute disease. Atherosclerosis. Electronically Signed   By: Drusilla Kanner M.D.   On: 03/14/2018 16:36    Cardiac Studies   Echo 01/15/16 Study Conclusions  - Left ventricle: The cavity size was normal. There was mild   hypertrophy of the posterior wall. Systolic function was normal.   The estimated ejection fraction was in the range of 60% to 65%.   Wall motion was normal; there were no regional wall motion   abnormalities. Features are consistent with a  pseudonormal left   ventricular filling pattern, with concomitant abnormal relaxation   and increased filling pressure (grade 2 diastolic dysfunction). - Aortic valve: There was moderate regurgitation. - Mitral valve: There was mild regurgitation. - Left atrium: The atrium was severely dilated. - Right ventricle: The cavity size was normal. Wall thickness was   normal. Systolic function was normal. - Right atrium: The atrium was moderately dilated. - Atrial septum: No defect or patent foramen ovale was identified   by color flow Doppler. - Tricuspid valve: There was mild regurgitation. - Inferior vena cava: The vessel was normal in size. The   respirophasic diameter changes were in the normal range (= 50%),   consistent with normal central venous pressure.   Patient Profile     84 y.o. female with a history of HTN, HL, PAF, PVC's, and BPV,  Now admitted with chest pain also with hypertension to 196/72.  Assessment & Plan    Chest pain, atypical, w/ h/o HTN/HL/PVCs, EKG with no ST/T wave changes and freq PVCs.  Troponins neg X 3--very brief episode with neg troponin no further ischemic work up needed. - would stop NTG paste if Dr. Tresa Endo agrees --pt to be discharged today.   PVCs chronic   And asymptomatic   HTN - very elevated on admit now improved 136/67 --amlodipine 5 mg has been added to meds  No BB with lower end of HR and 1st degree AV block.  Hx PAF,  Maintaining SR unclear hx not on OAC.  All SR here.   For questions or updates, please contact [facility] Please consult [email or web address] for contact info under Cardiology/STEMI.      Signed, Nada Boozer, NP  03/15/2018, 11:03 AM     Patient seen and examined. Agree with assessment and plan. Pt feels well. Non cardiac chest pain.  Asymptomatic occasional to frequent PVC's, first degree AV block.  Amlodipine was started for BP elevation by primary service. Troponin negative. DC NTG paste. Plan for dc today; arrange f/U  with Dr. Jasper Riling, MD, [facility] 03/15/2018 11:39 AM

## 2018-03-15 NOTE — Progress Notes (Signed)
[facility]. Anticipated discharge date: 03/15/18  Family notified: patient called family herself Transportation by: [facility] staff to pick up at 1:30pm.  Nurse to call report to [phone].   CSW signing off.  Abigail ButtsSusan Mayley Lish, LCSWA  Clinical Social Worker

## 2018-03-16 ENCOUNTER — Telehealth: Payer: Self-pay

## 2018-03-16 NOTE — Telephone Encounter (Signed)
I have made the 1st attempt to contact the patient or family member in charge, in order to follow up from recently being discharged from the hospital. I left a message on voicemail but I will make another attempt at a different time.  

## 2018-03-19 ENCOUNTER — Telehealth: Payer: Self-pay

## 2018-03-19 NOTE — Telephone Encounter (Signed)
2nd attempt to reach Candice Willis. I got her on the phone and she said she needed to put her hearing aids in and to call back in 5 minutes. I tried multiple times but her line was busy. I will try at a later time

## 2018-03-19 NOTE — Telephone Encounter (Signed)
Transition Care Management Follow-Up Telephone Call   Date discharged and where:[facility] on 03/15/2018  How have you been since you were released from the hospital? No complaints and no chest pain  Any patient concerns? None  Items Reviewed:   Meds: Y  Allergies: Y  Dietary Changes Reviewed: Y  Functional Questionnaire:  Independent-I Dependent-D  ADLs:   Dressing- I    Eating- I   Maintaining continence- I   Transferring- I w/ cane   Transportation- D   Meal Prep- I w/ assist   Managing Meds- I w/ assit  Confirmed importance and Date/Time of follow-up visits scheduled: Yes, Dr. Glade Lloyd will call for follow up visit Cardiologist set up for 5/17/@ 8am   Confirmed with patient if condition worsens to call PCP or go to the Emergency Dept. Patient was given office number and encouraged to call back with questions or concerns: Yes

## 2018-03-20 ENCOUNTER — Encounter: Payer: Self-pay | Admitting: Cardiology

## 2018-04-03 ENCOUNTER — Non-Acute Institutional Stay: Payer: Medicare Other | Admitting: Nurse Practitioner

## 2018-04-03 ENCOUNTER — Encounter: Payer: Self-pay | Admitting: Nurse Practitioner

## 2018-04-03 DIAGNOSIS — L03116 Cellulitis of left lower limb: Secondary | ICD-10-CM | POA: Insufficient documentation

## 2018-04-03 DIAGNOSIS — D509 Iron deficiency anemia, unspecified: Secondary | ICD-10-CM | POA: Diagnosis not present

## 2018-04-03 DIAGNOSIS — T25222D Burn of second degree of left foot, subsequent encounter: Secondary | ICD-10-CM | POA: Insufficient documentation

## 2018-04-03 DIAGNOSIS — T25222A Burn of second degree of left foot, initial encounter: Secondary | ICD-10-CM

## 2018-04-03 DIAGNOSIS — R413 Other amnesia: Secondary | ICD-10-CM

## 2018-04-03 DIAGNOSIS — I10 Essential (primary) hypertension: Secondary | ICD-10-CM

## 2018-04-03 NOTE — Assessment & Plan Note (Signed)
anemia, may consider dc Fe 324 mg daily, last Hgb 12.2 03/15/18

## 2018-04-03 NOTE — Assessment & Plan Note (Signed)
She has a hot liquid burnt blister at the medial of the left foot below the malleolus ruptured yesterday, noted pain, swelling, and warmth in the hindfoot around the ruptured blister. Will treat with Doxycycline  bid po x 7 days. Observe.

## 2018-04-03 NOTE — Assessment & Plan Note (Signed)
She has a hot liquid burnt blister at the medial of the left foot below the malleolus ruptured yesterday, now she complains of pain, swelling, and warmth in left foot. She is afebrile. Will apply Silvadene cream to medial left foot below ankle daily x 10 days.

## 2018-04-03 NOTE — Assessment & Plan Note (Signed)
She resides in [facility], ambulates with cane, she has memory lapses, not taking memory preserving meds. MMSE 27/30 12/2017

## 2018-04-03 NOTE — Assessment & Plan Note (Signed)
her blood pressure is controlled, continue Amlodipine  qd.

## 2018-04-03 NOTE — Progress Notes (Signed)
Location:  [facility] Room Number: 822 Place of Service:  ALF [phone]) Provider:  Leilan Bochenek, Arna Snipe  NP  Oneal Grout, MD  Patient Care Team: Oneal Grout, MD as PCP - General (Internal Medicine) Nahser, Deloris Ping, MD as PCP - Cardiology (Cardiology) Venita Lick, MD as Consulting Physician (Orthopedic Surgery) Marykay Lex, MD as Consulting Physician (Cardiology) [place]ha Behlke X, NP as Nurse Practitioner (Internal Medicine)  Extended Emergency Contact Information Primary Emergency Contact: [name],Dmya Address: [address]           [place], [place] [address] [place] Phone: [phone] Relation: Niece Secondary Emergency Contact: [name],[name]  [place] Mobile Phone: [phone] Relation: Friend  Code Status:  DNR Goals of care: Advanced Directive information Advanced Directives 04/03/2018  Does Patient Have a Medical Advance Directive? Yes  Type of Advance Directive Out of facility DNR (pink MOST or yellow form)  Does patient want to make changes to medical advance directive? No - Patient declined  Copy of Healthcare Power of Attorney in Chart? -  Would patient like information on creating a medical advance directive? Yes (Inpatient - patient requests chaplain consult to create a medical advance directive)  Pre-existing out of facility DNR order (yellow form or pink MOST form) Yellow form placed in chart (order not valid for inpatient use)     Chief Complaint  Patient presents with  . Medical Management of Chronic Issues    F/u- (L) foot open wound from burn blister.    HPI:  Pt is a 84 y.o. female seen today for medical management of chronic diseases.     The patient has Hx of anemia, on Fe 324 mg daily, last Hgb 12.2 03/15/18,  her blood pressure is controlled on Amlodipine  qd. She resides in [facility], ambulates with cane, she has memory lapses, not taking memory preserving meds.   She has a hot liquid burnt  blister at the medial of the left foot below the malleolus ruptured yesterday, now she complains of pain, swelling, and warmth in left foot. She is afebrile.  Past Medical History:  Diagnosis Date  . Arteriosclerotic cardiovascular disease   . Bee sting reaction 08/23/2016   Tongue swelling and difficulty swallowing /notes 08/23/2016  . Constipation   . Degenerative joint disease of right hip 10/15/14  . Fracture of multiple pubic rami (HCC) 10/15/14   Right inferior and superior pubic rami  . HOH (hard of hearing)    Wears bilateral hearing aids  . Hyperlipidemia   . Hypertension   . Macular degeneration   . Memory deficit   . Osteoporosis   . Pain in right foot   . Palpitations   . Paroxysmal atrial fibrillation (HCC)   . Thyroid nodule    Status post surgery  . Ulcer of hard palate   . Vertigo    Past Surgical History:  Procedure Laterality Date  . ABDOMINAL HYSTERECTOMY  1968  . APPENDECTOMY  1940's  . COLONOSCOPY    . FOOT SURGERY Left   . HARDWARE REMOVAL Right 02/16/2016   Procedure: HARDWARE REMOVAL RT HIP;  Surgeon: Sheral Apley, MD;  Location: [facility] OR;  Service: Orthopedics;  Laterality: Right;  . HIP ARTHROPLASTY Right 02/16/2016   Procedure: ARTHROPLASTY BIPOLAR HIP (HEMIARTHROPLASTY);  Surgeon: Sheral Apley, MD;  Location: [facility] OR;  Service: Orthopedics;  Laterality: Right;  . INTRAMEDULLARY (IM) NAIL INTERTROCHANTERIC Right 09/11/2015   Procedure: INTRAMEDULLARY (IM) NAIL INTERTROCHANTRIC;  Surgeon: Marcial Pacas  Jamison Neighbor, MD;  Location: [facility] OR;  Service: Orthopedics;  Laterality: Right;  . KNEE SURGERY      Allergies  Allergen Reactions  . Benadryl [Diphenhydramine] Other (See Comments)    Muscular degeneration  . Penicillins Other (See Comments)    Patient doesn't recall reaction Has patient had a PCN reaction causing immediate rash, facial/tongue/throat swelling, SOB or lightheadedness with hypotension: Unk Has patient had a PCN reaction causing severe rash  involving mucus membranes or skin necrosis: Unk Has patient had a PCN reaction that required hospitalization: Unk Has patient had a PCN reaction occurring within the last 10 years: No If all of the above answers are "NO", then may proceed with Cephalosporin use.     Outpatient Encounter Medications as of 04/03/2018  Medication Sig  . acetaminophen (TYLENOL) 500 MG tablet Take 500 mg by mouth every 6 (six) hours as needed (for pain, headaches, or fever).   Marland Kitchen amLODipine (NORVASC) 5 MG tablet Take 1 tablet (5 mg total) by mouth daily.  Marland Kitchen aspirin 325 MG tablet Take 325 mg by mouth daily.  Marland Kitchen atorvastatin (LIPITOR) 10 MG tablet Take 10 mg by mouth daily.  . Biotin 16109 MCG TABS Take 10,000 mcg by mouth daily.   . calcium carbonate (OSCAL) 1500 (600 Ca) MG TABS tablet Take 600 mg of elemental calcium by mouth 2 (two) times daily with a meal.  . Cholecalciferol (VITAMIN D3) 2000 units TABS Take 2,000 Units by mouth daily.  Marland Kitchen docusate sodium (COLACE) 100 MG capsule Take 100 mg by mouth 2 (two) times daily.   Marland Kitchen EPINEPHrine 0.3 mg/0.3 mL IJ SOAJ injection Inject 0.3 mLs (0.3 mg total) into the muscle once as needed (anaphylaxis/allergic reaction).  . ferrous gluconate (FERGON) 324 MG tablet Take 1 tablet (324 mg total) by mouth daily with breakfast.  . meclizine (ANTIVERT) 12.5 MG tablet Take 12.5 mg by mouth 2 (two) times daily.   . Multiple Vitamins-Minerals (PRESERVISION AREDS 2 PO) Take 2 capsules by mouth daily.   . Multiple Vitamins-Minerals (THEREMS-M) TABS Take 1 tablet by mouth daily.  . polyethylene glycol (MIRALAX / GLYCOLAX) packet Take 17 g by mouth every other day. HOLD FOR LOOSE STOOLS   No facility-administered encounter medications on file as of 04/03/2018.    ROS was provided with assistance of staff Review of Systems  Constitutional: Negative for activity change, appetite change, chills, diaphoresis, fatigue and fever.  HENT: Positive for hearing loss. Negative for congestion,  trouble swallowing and voice change.   Respiratory: Negative for cough, shortness of breath and wheezing.   Cardiovascular: Positive for leg swelling. Negative for chest pain and palpitations.  Gastrointestinal: Negative for abdominal distention, abdominal pain, constipation, diarrhea, nausea and vomiting.  Genitourinary: Negative for difficulty urinating, dysuria and urgency.  Musculoskeletal: Positive for gait problem.  Skin: Positive for wound.       Medial left hindfoot ruptured blister, pain, warmth, swelling.   Neurological: Negative for dizziness, speech difficulty, weakness and headaches.       Memory lapses.   Psychiatric/Behavioral: Negative for agitation, behavioral problems, hallucinations and sleep disturbance. The patient is not nervous/anxious.     Immunization History  Administered Date(s) Administered  . Influenza-Unspecified 09/07/2016, 09/12/2017  . PPD Test 02/18/2016  . Tdap 10/07/2012   Pertinent  Health Maintenance Due  Topic Date Due  . PNA vac Low Risk Adult (1 of 2 - PCV13) 04/15/1991  . INFLUENZA VACCINE  06/21/2018  . DEXA SCAN  Completed   Fall Risk  08/03/2017 10/24/2014  Falls in the past year? No Yes  Number falls in past yr: - 1  Injury with Fall? - Yes  Risk Factor Category  - High Fall Risk  Risk for fall due to : - History of fall(s)   Functional Status Survey:    Vitals:   04/03/18 1053  BP: 110/60  Pulse: 76  Resp: 20  Temp: 98.2 F (36.8 C)  Weight: 122 lb 12.8 oz (55.7 kg)  Height:  (1.549 m)   Body mass index is 23.2 kg/m. Physical Exam  Constitutional: She appears well-developed and well-nourished.  HENT:  Head: Normocephalic and atraumatic.  Eyes: Pupils are equal, round, and reactive to light. EOM are normal.  Neck: Normal range of motion. Neck supple. No JVD present. No thyromegaly present.  Cardiovascular: Normal rate and regular rhythm.  No murmur heard. Pulmonary/Chest: Effort normal and breath sounds normal.  She has no wheezes. She has no rales.  Abdominal: Soft. Bowel sounds are normal. She exhibits no distension. There is no tenderness.  Musculoskeletal: She exhibits edema and tenderness.  Ambulates with cane. Left ankle/hindfoot warmth, swelling, pain.   Neurological: She is alert. No cranial nerve deficit. She exhibits normal muscle tone. Coordination normal.  Oriented to person and place.   Skin: Skin is warm and dry. There is erythema.  Medial left foot under ankle a sized 3x3 ruptured burn blister with skin missing, no odorous drainage. Left hindfoot swelling, redness, warmth, and pain noted.   Psychiatric: She has a normal mood and affect. Her behavior is normal. Judgment and thought content normal.    Labs reviewed: Recent Labs    01/13/18 2022  02/20/18 03/14/18 1533 03/14/18 1725 03/15/18 0315  NA 132*   < > 135* 127*  --  132*  K 3.3*   < > 4.6 4.0  --  3.8  CL 99*  --   --  95*  --  100*  CO2 23  --   --  23  --  22  GLUCOSE 130*  --   --  95  --  96  BUN 14   < > 13 12  --  11  CREATININE 0.67   < > 0.7 0.64  --  0.63  CALCIUM 9.6  --   --  9.7  --  9.1  MG  --   --   --   --  2.0 1.9   < > = values in this interval not displayed.   Recent Labs    02/20/18 03/14/18 1533 03/15/18 0315  AST ALT ALKPHOS 47 51 48  BILITOT  --  0.8 0.8  PROT  --  7.3 6.5  ALBUMIN  --  4.2 3.8   Recent Labs    03/14/18 1533 03/14/18 1831 03/15/18 0315  WBC 7.1 7.4 6.9  NEUTROABS 4.9  --   --   HGB 13.4 13.3 12.2  HCT 37.0 37.7 35.1*  MCV 89.4 89.1 88.4  PLT 289 301 279   Lab Results  Component Value Date   TSH 1.876 03/14/2018   Lab Results  Component Value Date   HGBA1C 5.4 03/14/2018   Lab Results  Component Value Date   CHOL 179 03/14/2018   HDL 70 03/14/2018   LDLCALC 95 03/14/2018   TRIG 69 03/14/2018   CHOLHDL 2.6 03/14/2018    Significant Diagnostic Results in last 30 days:  Dg Chest 2 View  Result  Date: 03/14/2018 CLINICAL DATA:   Acute onset chest and epigastric pain today. EXAM: CHEST - 2 VIEW COMPARISON:  Single-view of the chest 09/10/2015. FINDINGS: The lungs are clear. Heart size is normal. No pneumothorax or pleural effusion. Aortic atherosclerosis is noted. No acute bony abnormality. IMPRESSION: No acute disease. Atherosclerosis. Electronically Signed   By: Drusilla Kanner M.D.   On: 03/14/2018 16:36    Assessment/Plan Anemia, iron deficiency anemia, may consider dc Fe 324 mg daily, last Hgb 12.2 03/15/18  Essential hypertension her blood pressure is controlled, continue Amlodipine  qd.   Memory deficit She resides in [facility], ambulates with cane, she has memory lapses, not taking memory preserving meds. MMSE 27/30 12/2017    Left foot burn, second degree, initial encounter She has a hot liquid burnt blister at the medial of the left foot below the malleolus ruptured yesterday, now she complains of pain, swelling, and warmth in left foot. She is afebrile. Will apply Silvadene cream to medial left foot below ankle daily x 10 days.    Cellulitis of left foot She has a hot liquid burnt blister at the medial of the left foot below the malleolus ruptured yesterday, noted pain, swelling, and warmth in the hindfoot around the ruptured blister. Will treat with Doxycycline  bid po x 7 days. Observe.        Family/ staff Communication: plan of care reviewed with the patient and charge nurse.   Labs/tests ordered:  None  Time spend 25 minutes.

## 2018-04-04 ENCOUNTER — Encounter: Payer: Self-pay | Admitting: Nurse Practitioner

## 2018-04-06 ENCOUNTER — Ambulatory Visit: Payer: Medicare Other | Admitting: Cardiology

## 2018-04-09 ENCOUNTER — Ambulatory Visit: Payer: Medicare Other | Admitting: Cardiology

## 2018-04-13 ENCOUNTER — Telehealth: Payer: Self-pay

## 2018-04-13 NOTE — Telephone Encounter (Signed)
Pt called to confirm appointment on 04/17/18 with Darnelle Bos, PA. Pt instructed that is the correct time and date.

## 2018-04-16 NOTE — Progress Notes (Signed)
Cardiology Office Note:    Date:  04/17/2018   ID:  BEUNA BOLDING, DOB 1924/01/18, MRN [id number]  PCP:  Oneal Grout, MD  Cardiologist:  Kristeen Miss, MD  Referring MD: Oneal Grout, MD   Chief Complaint  Patient presents with  . Hospitalization Follow-up    chest pain    History of Present Illness:    Candice Willis is a 84 y.o. female with a past medical history significant for osteoarthritis, osteoporosis, hearing difficulty, benign positional vertigo, hyperlipidemia, paroxysmal atrial fibrillation. o previous history of CAD.   Ms. Regal was admitted to the hospital 03/14/18-03/15/18 for the evaluation of chest pain. The patient resides at a skilled nursing facility. She had developed chest pain while at a dental appointment. She was chest pain-free at the hospital and EKG showed no acute ischemia. Cardiac enzymes remained negative. Her blood pressure was initially elevated which was treated with IV hydralazine. Her symptoms were felt to be vague and atypical, not consistent with angina. She was in sinus and did have PVCs which are chronic for her.  Ms. Eckerson is here today for hospital follow up with her friend Fannie Knee. She has had no further discomfort like what she presented to the hospital with which was mild localized pressure in her upper abdomen. She denies chest pain/pressure/tightness, shortness of breath, palpitations, orthpnea, PND or edema. She has occ lightheadedness which has been longstanding and has been described as benign positional vertigo in the past.   [facility] and exercises a lot at the exercise room. She has occ dizziness with exercise but she says that she can manage it. No recnet falls. She did fall and fracture her hip 2 years ago.   She has a healing burn on her left medial foot/heal. She has been treated with antibiotic and she reports improvement.    Past Medical History:  Diagnosis Date  . Arteriosclerotic cardiovascular  disease   . Bee sting reaction 08/23/2016   Tongue swelling and difficulty swallowing /notes 08/23/2016  . Constipation   . Degenerative joint disease of right hip 10/15/14  . Fracture of multiple pubic rami (HCC) 10/15/14   Right inferior and superior pubic rami  . HOH (hard of hearing)    Wears bilateral hearing aids  . Hyperlipidemia   . Hypertension   . Macular degeneration   . Memory deficit   . Osteoporosis   . Pain in right foot   . Palpitations   . Paroxysmal atrial fibrillation (HCC)   . Thyroid nodule    Status post surgery  . Ulcer of hard palate   . Vertigo     Past Surgical History:  Procedure Laterality Date  . ABDOMINAL HYSTERECTOMY  1968  . APPENDECTOMY  1940's  . COLONOSCOPY    . FOOT SURGERY Left   . HARDWARE REMOVAL Right 02/16/2016   Procedure: HARDWARE REMOVAL RT HIP;  Surgeon: Sheral Apley, MD;  Location: [facility] OR;  Service: Orthopedics;  Laterality: Right;  . HIP ARTHROPLASTY Right 02/16/2016   Procedure: ARTHROPLASTY BIPOLAR HIP (HEMIARTHROPLASTY);  Surgeon: Sheral Apley, MD;  Location[facility] OR;  Service: Orthopedics;  Laterality: Right;  . INTRAMEDULLARY (IM) NAIL INTERTROCHANTERIC Right 09/11/2015   Procedure: INTRAMEDULLARY (IM) NAIL INTERTROCHANTRIC;  Surgeon: Sheral Apley, MD;  Location: [facility] OR;  Service: Orthopedics;  Laterality: Right;  . KNEE SURGERY      Current Medications: Current Meds  Medication Sig  . acetaminophen (TYLENOL) 500 MG tablet Take 500 mg by  mouth every 6 (six) hours as needed (for pain, headaches, or fever).   Marland Kitchen amLODipine (NORVASC) 5 MG tablet Take 1 tablet (5 mg total) by mouth daily.  Marland Kitchen aspirin 325 MG tablet Take 325 mg by mouth daily.  Marland Kitchen atorvastatin (LIPITOR) 10 MG tablet Take 10 mg by mouth daily.  . Biotin 16109 MCG TABS Take 10,000 mcg by mouth daily.   . calcium carbonate (OSCAL) 1500 (600 Ca) MG TABS tablet Take 600 mg of elemental calcium by mouth 2 (two) times daily with a meal.  . Cholecalciferol  (VITAMIN D3) 2000 units TABS Take 2,000 Units by mouth daily.  Marland Kitchen docusate sodium (COLACE) 100 MG capsule Take 100 mg by mouth 2 (two) times daily.   Marland Kitchen EPINEPHrine 0.3 mg/0.3 mL IJ SOAJ injection Inject 0.3 mLs (0.3 mg total) into the muscle once as needed (anaphylaxis/allergic reaction).  . ferrous gluconate (FERGON) 324 MG tablet Take 1 tablet (324 mg total) by mouth daily with breakfast.  . meclizine (ANTIVERT) 12.5 MG tablet Take 12.5 mg by mouth 2 (two) times daily.   . Multiple Vitamins-Minerals (PRESERVISION AREDS 2 PO) Take 2 capsules by mouth daily.   . Multiple Vitamins-Minerals (THEREMS-M) TABS Take 1 tablet by mouth daily.  . polyethylene glycol (MIRALAX / GLYCOLAX) packet Take 17 g by mouth every other day. HOLD FOR LOOSE STOOLS     Allergies:   Benadryl [diphenhydramine] and Penicillins   Social History   Socioeconomic History  . Marital status: Single    Spouse name: Not on file  . Number of children: 0  . Years of education: 16+  . Highest education level: Not on file  Occupational History  . Occupation: Retired   Engineer, production  . Financial resource strain: Not on file  . Food insecurity:    Worry: Not on file    Inability: Not on file  . Transportation needs:    Medical: Not on file    Non-medical: Not on file  Tobacco Use  . Smoking status: Former Smoker    Packs/day: 0.50    Types: Cigarettes    Last attempt to quit: 10/05/1971    Years since quitting: 46.5  . Smokeless tobacco: Never Used  Substance and Sexual Activity  . Alcohol use: Yes    Comment: 1-4 per week; eats 10 raisins daily soaked in Gin  . Drug use: No  . Sexual activity: Never  Lifestyle  . Physical activity:    Days per week: Not on file    Minutes per session: Not on file  . Stress: Not on file  Relationships  . Social connections:    Talks on phone: Not on file    Gets together: Not on file    Attends religious service: Not on file    Active member of club or organization: Not on  file    Attends meetings of clubs or organizations: Not on file    Relationship status: Not on file  Other Topics Concern  . Not on file  Social History Narrative   Lives at [facility]. Moved to [place] 07/06/16   Never married   Caffeine use: 2 cups coffee per day   No tea/soda   Former smoker 1972   Alcohol 1-4 per week. Eats 10 raisins daily soaked in Gin   DNR     Family History: The patient's family history includes Cancer in her brother, father, maternal grandmother, and sister; Heart disease in her brother, father, and paternal grandmother; Hyperlipidemia  in her brother and sister. ROS:   Please see the history of present illness.     All other systems reviewed and are negative.  EKGs/Labs/Other Studies Reviewed:    The following studies were reviewed today:  Echocardiogram 01/15/16: eF 60-65 percent, mild hypertrophy of the posterior wall, grade 2 diastolic dysfunction, moderate aortic regurgitation, mild mitral regurgitation, severely dilated left atrium  EKG:  EKG is not ordered today.    Recent Labs: 03/14/2018: TSH 1.876 03/15/2018: ALT 18; BUN 11; Creatinine, Ser 0.63; Hemoglobin 12.2; Magnesium 1.9; Platelets 279; Potassium 3.8; Sodium 132   Recent Lipid Panel    Component Value Date/Time   CHOL 179 03/14/2018 1831   TRIG 69 03/14/2018 1831   HDL 70 03/14/2018 1831   CHOLHDL 2.6 03/14/2018 1831   VLDL 14 03/14/2018 1831   LDLCALC 95 03/14/2018 1831    Physical Exam:    VS:  BP 132/70   Pulse (!) 56   Ht  (1.549 m)   Wt 124 lb 9.6 oz (56.5 kg)   SpO2 97%   BMI 23.54 kg/m     Wt Readings from Last 3 Encounters:  04/17/18 124 lb 9.6 oz (56.5 kg)  04/03/18 122 lb 12.8 oz (55.7 kg)  03/15/18 119 lb (54 kg)     Physical Exam  Constitutional: She is oriented to person, place, and time. She appears well-developed and well-nourished. No distress.  HENT:  Head: Normocephalic and atraumatic.  Hard of hearing  Neck: Normal range of motion.  Neck supple. No JVD present.  Cardiovascular: Normal rate, regular rhythm, normal heart sounds and intact distal pulses. Exam reveals no gallop and no friction rub.  No murmur heard. Occasional early beat on exam.   Pulmonary/Chest: Effort normal and breath sounds normal. No respiratory distress. She has no wheezes. She has no rales.  Abdominal: Soft. Bowel sounds are normal.  Musculoskeletal: Normal range of motion.  Left medial heal area with healing burn and some associated swelling. Otherwise no edema.   Neurological: She is alert and oriented to person, place, and time.  Skin: Skin is warm and dry.  Psychiatric: She has a normal mood and affect. Her behavior is normal. Thought content normal.  Vitals reviewed.    ASSESSMENT:    1. Essential hypertension   2. PAF (paroxysmal atrial fibrillation) (HCC)   3. PVC's (premature ventricular contractions)   4. Mixed hyperlipidemia    PLAN:    In order of problems listed above:  Atypical chest pain: actually was localized mild abdominal pressure. No previous CAD history. She ruled out for cardiac etiology in the hospital. No further discomfort and no new symptoms. No need for further workup at present. The patient wants to only follow up as needed.   Hypertension: patient had been off meds, BP was elevated in the hospital in April. Amlodipine 5 mg was resumed. BP well controlled. No increase in her long term complaints of dizziness.   Paroxysmal atrial fibrillation: unclear history. Not on oral anticoagulation. Maintaining sinus rhythm.  PVCs: Chronic, asymptomatic  Hyperlipidemia: Lipitor 10 mg. LDL goal is <100, in setting of no known CAD. LDL was 95 in 02/2018, at goal.   Medication Adjustments/Labs and Tests Ordered: Current medicines are reviewed at length with the patient today.  Concerns regarding medicines are outlined above. Labs and tests ordered and medication changes are outlined in the patient instructions below:    Patient Instructions  Medication Instructions: Your physician recommends that you continue on your current  medications as directed. Please refer to the Current Medication list given to you today.   Labwork: None ordered  Procedures/Testing: None Ordered  Follow-Up: Follow up with Dr. Elease Hashimoto as needed.   Any Additional Special Instructions Will Be Listed Below (If Applicable).  There are no changes in pt's medications.   If you need a refill on your cardiac medications before your next appointment, please call your pharmacy.      Signed, Berton Bon, NP  04/17/2018 1:20 PM    [facility]

## 2018-04-17 ENCOUNTER — Ambulatory Visit: Payer: Medicare Other | Admitting: Cardiology

## 2018-04-17 ENCOUNTER — Encounter: Payer: Self-pay | Admitting: Cardiology

## 2018-04-17 VITALS — BP 132/70 | HR 56 | Ht 61.0 in | Wt 124.6 lb

## 2018-04-17 DIAGNOSIS — E782 Mixed hyperlipidemia: Secondary | ICD-10-CM

## 2018-04-17 DIAGNOSIS — I493 Ventricular premature depolarization: Secondary | ICD-10-CM | POA: Diagnosis not present

## 2018-04-17 DIAGNOSIS — I480 Paroxysmal atrial fibrillation: Secondary | ICD-10-CM

## 2018-04-17 DIAGNOSIS — I10 Essential (primary) hypertension: Secondary | ICD-10-CM

## 2018-04-17 NOTE — Patient Instructions (Signed)
Medication Instructions: Your physician recommends that you continue on your current medications as directed. Please refer to the Current Medication list given to you today.   Labwork: None ordered  Procedures/Testing: None Ordered  Follow-Up: Follow up with Dr. Elease Hashimoto as needed.   Any Additional Special Instructions Will Be Listed Below (If Applicable).  There are no changes in pt's medications.   If you need a refill on your cardiac medications before your next appointment, please call your pharmacy.

## 2018-04-18 ENCOUNTER — Encounter: Payer: Self-pay | Admitting: Nurse Practitioner

## 2018-04-18 ENCOUNTER — Non-Acute Institutional Stay: Payer: Medicare Other | Admitting: Nurse Practitioner

## 2018-04-18 DIAGNOSIS — L03116 Cellulitis of left lower limb: Secondary | ICD-10-CM

## 2018-04-18 DIAGNOSIS — T25222D Burn of second degree of left foot, subsequent encounter: Secondary | ICD-10-CM

## 2018-04-18 NOTE — Progress Notes (Signed)
Location:  [facility] Room Number: 822 Place of Service:  ALF [phone]) Provider:  Ivylynn Hoppes, Arna Snipe  NP  Oneal Grout, MD  Patient Care Team: Oneal Grout, MD as PCP - General (Internal Medicine) Nahser, Deloris Ping, MD as PCP - Cardiology (Cardiology) Venita Lick, MD as Consulting Physician (Orthopedic Surgery) Marykay Lex, MD as Consulting Physician (Cardiology) Dewitte Vannice X, NP as Nurse Practitioner (Internal Medicine)  Extended Emergency Contact Information Primary Emergency Contact: [name],Ekam Address: [address]           [place], [place] [address] [place] Phone: [phone] Relation: Niece Secondary Emergency Contact: [name],[name]  [place] Mobile Phone: [phone] Relation: Friend  Code Status:  DNR Goals of care: Advanced Directive information Advanced Directives 04/03/2018  Does Patient Have a Medical Advance Directive? Yes  Type of Advance Directive Out of facility DNR (pink MOST or yellow form)  Does patient want to make changes to medical advance directive? No - Patient declined  Copy of Healthcare Power of Attorney in Chart? -  Would patient like information on creating a medical advance directive? Yes (Inpatient - patient requests chaplain consult to create a medical advance directive)  Pre-existing out of facility DNR order (yellow form or pink MOST form) Yellow form placed in chart (order not valid for inpatient use)     Chief Complaint  Patient presents with  . Acute Visit    C/o pain, edema to(L) foot/ankle     HPI:  Pt is a 84 y.o. female seen today for an acute visit for complaining of left ankle pain with dried yellow scab in the area where she sustained a burnt wound from hot coffee 03/23/18. She completed 10 day course of Silvadene cream and 7 day course of Doxycyline  bid po.    Past Medical History:  Diagnosis Date  . Arteriosclerotic cardiovascular disease   . Bee sting  reaction 08/23/2016   Tongue swelling and difficulty swallowing /notes 08/23/2016  . Constipation   . Degenerative joint disease of right hip 10/15/14  . Fracture of multiple pubic rami (HCC) 10/15/14   Right inferior and superior pubic rami  . HOH (hard of hearing)    Wears bilateral hearing aids  . Hyperlipidemia   . Hypertension   . Macular degeneration   . Memory deficit   . Osteoporosis   . Pain in right foot   . Palpitations   . Paroxysmal atrial fibrillation (HCC)   . Thyroid nodule    Status post surgery  . Ulcer of hard palate   . Vertigo    Past Surgical History:  Procedure Laterality Date  . ABDOMINAL HYSTERECTOMY  1968  . APPENDECTOMY  1940's  . COLONOSCOPY    . FOOT SURGERY Left   . HARDWARE REMOVAL Right 02/16/2016   Procedure: HARDWARE REMOVAL RT HIP;  Surgeon: Sheral Apley, MD;  Location: [facility] OR;  Service: Orthopedics;  Laterality: Right;  . HIP ARTHROPLASTY Right 02/16/2016   Procedure: ARTHROPLASTY BIPOLAR HIP (HEMIARTHROPLASTY);  Surgeon: Sheral Apley, MD;  Location[facility] OR;  Service: Orthopedics;  Laterality: Right;  . INTRAMEDULLARY (IM) NAIL INTERTROCHANTERIC Right 09/11/2015   Procedure: INTRAMEDULLARY (IM) NAIL INTERTROCHANTRIC;  Surgeon: Sheral Apley, MD;  Location: [facility] OR;  Service: Orthopedics;  Laterality: Right;  . KNEE SURGERY      Allergies  Allergen Reactions  . Benadryl [Diphenhydramine] Other (See Comments)    Muscular degeneration  . Penicillins Other (See Comments)  Patient doesn't recall reaction Has patient had a PCN reaction causing immediate rash, facial/tongue/throat swelling, SOB or lightheadedness with hypotension: Unk Has patient had a PCN reaction causing severe rash involving mucus membranes or skin necrosis: Unk Has patient had a PCN reaction that required hospitalization: Unk Has patient had a PCN reaction occurring within the last 10 years: No If all of the above answers are "NO", then may proceed with  Cephalosporin use.     Outpatient Encounter Medications as of 04/18/2018  Medication Sig  . acetaminophen (TYLENOL) 500 MG tablet Take 500 mg by mouth every 6 (six) hours as needed (for pain, headaches, or fever).   Marland Kitchen amLODipine (NORVASC) 5 MG tablet Take 1 tablet (5 mg total) by mouth daily.  Marland Kitchen aspirin 325 MG tablet Take 325 mg by mouth daily.  Marland Kitchen atorvastatin (LIPITOR) 10 MG tablet Take 10 mg by mouth daily.  . Biotin 16109 MCG TABS Take 10,000 mcg by mouth daily.   . calcium carbonate (OSCAL) 1500 (600 Ca) MG TABS tablet Take 600 mg of elemental calcium by mouth 2 (two) times daily with a meal.  . Cholecalciferol (VITAMIN D3) 2000 units TABS Take 2,000 Units by mouth daily.  Marland Kitchen docusate sodium (COLACE) 100 MG capsule Take 100 mg by mouth 2 (two) times daily.   Marland Kitchen EPINEPHrine 0.3 mg/0.3 mL IJ SOAJ injection Inject 0.3 mLs (0.3 mg total) into the muscle once as needed (anaphylaxis/allergic reaction).  . ferrous gluconate (FERGON) 324 MG tablet Take 1 tablet (324 mg total) by mouth daily with breakfast.  . meclizine (ANTIVERT) 12.5 MG tablet Take 12.5 mg by mouth 2 (two) times daily.   . Multiple Vitamins-Minerals (PRESERVISION AREDS 2 PO) Take 2 capsules by mouth daily.   . Multiple Vitamins-Minerals (THEREMS-M) TABS Take 1 tablet by mouth daily.  . polyethylene glycol (MIRALAX / GLYCOLAX) packet Take 17 g by mouth every other day. HOLD FOR LOOSE STOOLS   No facility-administered encounter medications on file as of 04/18/2018.     Review of Systems  Constitutional: Negative for activity change, appetite change, chills, diaphoresis, fatigue and fever.  HENT: Positive for hearing loss. Negative for congestion and voice change.   Respiratory: Negative for cough and shortness of breath.   Cardiovascular: Positive for leg swelling. Negative for chest pain and palpitations.  Musculoskeletal: Positive for gait problem.  Skin: Positive for wound. Negative for color change and pallor.       Left  ankle.   Neurological: Negative for dizziness, speech difficulty and weakness.       Memory lapses.   Psychiatric/Behavioral: Negative for agitation and behavioral problems.    Immunization History  Administered Date(s) Administered  . Influenza-Unspecified 09/07/2016, 09/12/2017  . PPD Test 02/18/2016  . Tdap 10/07/2012   Pertinent  Health Maintenance Due  Topic Date Due  . PNA vac Low Risk Adult (1 of 2 - PCV13) 04/15/1991  . INFLUENZA VACCINE  06/21/2018  . DEXA SCAN  Completed   Fall Risk  08/03/2017 10/24/2014  Falls in the past year? No Yes  Number falls in past yr: - 1  Injury with Fall? - Yes  Risk Factor Category  - High Fall Risk  Risk for fall due to : - History of fall(s)   Functional Status Survey:    Vitals:   04/18/18 1015  BP: 130/80  Pulse: 74  Resp: 18  Temp: (!) 97 F (36.1 C)  SpO2: 97%  Weight: 122 lb 12.8 oz (55.7 kg)  Height: 5'  1" (1.549 m)   Body mass index is 23.2 kg/m. Physical Exam  Constitutional: She appears well-developed and well-nourished.  HENT:  Head: Normocephalic and atraumatic.  Eyes: Pupils are equal, round, and reactive to light. EOM are normal.  Neck: Normal range of motion. Neck supple. No JVD present. No thyromegaly present.  Cardiovascular: Normal rate and regular rhythm.  No murmur heard. Pulmonary/Chest: Effort normal and breath sounds normal. She has no wheezes. She has no rales.  Musculoskeletal: She exhibits edema and tenderness.  Left ankle edema trace, pain when the inner left ankle palpated, no decreased ROM  Neurological: She is alert. No cranial nerve deficit. Coordination normal.  Oriented to person and place.   Skin: Skin is warm and dry. There is erythema.  Medial left hindfoot near the left medial malleolus burnt wound scabbed over with yellow dried drainage,  Erythema, swelling, warmth posterior left lower leg above ankle, left ankle, left hind foot including left heel,  mild pain when palpated, no sign of  infection.    Psychiatric: She has a normal mood and affect. Her behavior is normal.    Labs reviewed: Recent Labs    01/13/18 2022  02/20/18 03/14/18 1533 03/14/18 1725 03/15/18 0315  NA 132*   < > 135* 127*  --  132*  K 3.3*   < > 4.6 4.0  --  3.8  CL 99*  --   --  95*  --  100*  CO2 23  --   --  23  --  22  GLUCOSE 130*  --   --  95  --  96  BUN 14   < > 13 12  --  11  CREATININE 0.67   < > 0.7 0.64  --  0.63  CALCIUM 9.6  --   --  9.7  --  9.1  MG  --   --   --   --  2.0 1.9   < > = values in this interval not displayed.   Recent Labs    02/20/18 03/14/18 1533 03/15/18 0315  AST ALT ALKPHOS 47 51 48  BILITOT  --  0.8 0.8  PROT  --  7.3 6.5  ALBUMIN  --  4.2 3.8   Recent Labs    03/14/18 1533 03/14/18 1831 03/15/18 0315  WBC 7.1 7.4 6.9  NEUTROABS 4.9  --   --   HGB 13.4 13.3 12.2  HCT 37.0 37.7 35.1*  MCV 89.4 89.1 88.4  PLT 289 301 279   Lab Results  Component Value Date   TSH 1.876 03/14/2018   Lab Results  Component Value Date   HGBA1C 5.4 03/14/2018   Lab Results  Component Value Date   CHOL 179 03/14/2018   HDL 70 03/14/2018   LDLCALC 95 03/14/2018   TRIG 69 03/14/2018   CHOLHDL 2.6 03/14/2018    Significant Diagnostic Results in last 30 days:  No results found.  Assessment/Plan: Burn of left foot, second degree, subsequent encounter left ankle pain with dried yellow scab in the area where she sustained a burnt wound from hot coffee 03/23/18. She completed 10 day course of Silvadene cream and 7 day course of Doxycyline  bid po.  Will continue Silvadene cream bid to the left ankle open wound x 2 weeks, Tylenol  po bid x2 weeks for pain. Observe.   Cellulitis of left foot Posterior eft lower leg above the ankle, left hind foot  including left heel, left ankle, redness, swelling, warmth, s/p  7 day course of Doxycycline  bid, improved but not resolved, will extend Doxycycline  bid x 7 days. Observe.      Family/ staff Communication: plan of care reviewed with the patient and charge nurse.   Labs/tests ordered: none  Time spend 25 minutes.

## 2018-04-18 NOTE — Assessment & Plan Note (Signed)
left ankle pain with dried yellow scab in the area where she sustained a burnt wound from hot coffee 03/23/18. She completed 10 day course of Silvadene cream and 7 day course of Doxycyline  bid po.  Will continue Silvadene cream bid to the left ankle open wound x 2 weeks, Tylenol  po bid x2 weeks for pain. Observe.

## 2018-04-18 NOTE — Assessment & Plan Note (Addendum)
Posterior eft lower leg above the ankle, left hind foot including left heel, left ankle, redness, swelling, warmth, s/p  7 day course of Doxycycline  bid, improved but not resolved, will extend Doxycycline  bid x 7 days. Observe.

## 2018-04-19 ENCOUNTER — Encounter: Payer: Self-pay | Admitting: Nurse Practitioner

## 2018-04-23 ENCOUNTER — Encounter: Payer: Self-pay | Admitting: Internal Medicine

## 2018-04-24 ENCOUNTER — Telehealth: Payer: Self-pay | Admitting: Cardiology

## 2018-04-24 NOTE — Telephone Encounter (Signed)
error 

## 2018-04-25 DIAGNOSIS — G4700 Insomnia, unspecified: Secondary | ICD-10-CM | POA: Insufficient documentation

## 2018-04-26 ENCOUNTER — Telehealth: Payer: Self-pay | Admitting: *Deleted

## 2018-04-26 ENCOUNTER — Telehealth: Payer: Self-pay | Admitting: Cardiology

## 2018-04-26 NOTE — Telephone Encounter (Signed)
Spoke with kim with Dr. Anise Salvoeeves DDS office in response to her request for clearance for dental procedure and requested that she fax us a dental clearance form.

## 2018-04-26 NOTE — Telephone Encounter (Signed)
Candice Willis W/ Dr.Jerry Reeves,DDS office called needs Clearance for patient to have cleaning. Please fax to [phone].

## 2018-04-26 NOTE — Telephone Encounter (Signed)
Received clearance from  Dr. Rocco PaulsJerry Reeves DDS, PA . Clearance states need pre op clearance for dental treatment. I called the office for more information though dental office is closed. We will need to know what type of dental Tx, when is dental appt . I will not put this into Pre Op poo, yet as that we do not have all the information. Will leave for the CMA in Triage tomorrow 6/7 to see if they are able to call the dentist office to try and get more information.

## 2018-04-27 NOTE — Telephone Encounter (Signed)
Called Dr. Rocco PaulsJerry Reeves office again today and still that office is not open, will leave Dental Treatment Clearance for the next CMA in Triage on Monday 6/10 to see if they are able to call the dentist office to try and get more information.

## 2018-04-30 ENCOUNTER — Encounter: Payer: Self-pay | Admitting: Cardiology

## 2018-04-30 NOTE — Telephone Encounter (Signed)
   [facility] Pre-operative Risk Assessment    Request for surgical clearance:  1. What type of surgery is being performed? Dental cleaning and any further procedures   2. When is this surgery scheduled? None scheduled yet   3. What type of clearance is required (medical clearance vs. Pharmacy clearance to hold med vs. Both)? Medical  4. Are there any medications that need to be held prior to surgery and how long?  ASA  5. Practice name and name of physician performing surgery?Dr. Mirna Mires DDS  6. What is your office phone number: [phone]   7.   What is your office fax number:        [phone]  8.   Anesthesia type (None, local,         MAC, general) ? Local, General but none scheduled

## 2018-05-01 NOTE — Telephone Encounter (Signed)
   Primary Cardiologist: Kristeen MissPhilip Nahser, MD  Chart reviewed as part of pre-operative protocol coverage. Given past medical history and time since last visit, based on ACC/AHA guidelines, Candice Willis would be at acceptable risk for the planned procedure without further cardiovascular testing.   Can hold ASA, if needed, for 5 days prior to procedure.   I will route this recommendation to the requesting party via Epic fax function and remove from pre-op pool.  Please call with questions.  Robbie LisBrittainy Brodan Grewell, PA-C 05/01/2018, 3:35 PM

## 2018-05-09 ENCOUNTER — Encounter: Payer: Self-pay | Admitting: Nurse Practitioner

## 2018-05-09 ENCOUNTER — Non-Acute Institutional Stay: Payer: Medicare Other | Admitting: Nurse Practitioner

## 2018-05-09 DIAGNOSIS — T25222D Burn of second degree of left foot, subsequent encounter: Secondary | ICD-10-CM | POA: Diagnosis not present

## 2018-05-09 DIAGNOSIS — R413 Other amnesia: Secondary | ICD-10-CM

## 2018-05-09 DIAGNOSIS — Z7189 Other specified counseling: Secondary | ICD-10-CM | POA: Insufficient documentation

## 2018-05-09 NOTE — Progress Notes (Signed)
Location:  [facility] Room Number: 822 Place of Service:  ALF [phone]) Provider:  Amylah Will, Arna SnipeManXie  NP  Oneal GroutPandey, Mahima, MD  Patient Care Team: Oneal GroutPandey, Mahima, MD as PCP - General (Internal Medicine) Nahser, Deloris PingPhilip J, MD as PCP - Cardiology (Cardiology) Venita LickBrooks, Dahari, MD as Consulting Physician (Orthopedic Surgery) Marykay LexHarding, David W, MD as Consulting Physician (Cardiology) Bradleigh Sonnen X, NP as Nurse Practitioner (Internal Medicine)  Extended Emergency Contact Information Primary Emergency Contact: [name],Suhana Address: [address]           [place], [place] [id number] [place] Phone: [phone] Relation: Niece Secondary Emergency Contact: [name],[name]  [place] Mobile Phone: [phone] Relation: Friend  Code Status:  DNR Goals of care: Advanced Directive information Advanced Directives 05/09/2018  Does Patient Have a Medical Advance Directive? Yes  Type of Advance Directive Out of facility DNR (pink MOST or yellow form)  Does patient want to make changes to medical advance directive? No - Patient declined  Copy of Healthcare Power of Attorney in Chart? -  Would patient like information on creating a medical advance directive? -  Pre-existing out of facility DNR order (yellow form or pink MOST form) Yellow form placed in chart (order not valid for inpatient use)     Chief Complaint  Patient presents with  . Acute Visit    ACP Meeting    HPI:  Pt is a 84 y.o. female seen today for goals of care discussion.    [facility], has living will on file, previous DNR.   She complained a mild pain in the left foot, previously burnt wound at the mediall aspect of the left heel is healing nicely, lateral left medial heel and mid foot remains mild redness, swelling, and pain which appeared inflammation instead of infection. She has severe HOH, mild cognition impairment.    Past Medical History:  Diagnosis Date    . Arteriosclerotic cardiovascular disease   . Bee sting reaction 08/23/2016   Tongue swelling and difficulty swallowing /notes 08/23/2016  . Constipation   . Degenerative joint disease of right hip 10/15/14  . Fracture of multiple pubic rami (HCC) 10/15/14   Right inferior and superior pubic rami  . HOH (hard of hearing)    Wears bilateral hearing aids  . Hyperlipidemia   . Hypertension   . Macular degeneration   . Memory deficit   . Osteoporosis   . Pain in right foot   . Palpitations   . Paroxysmal atrial fibrillation (HCC)   . Thyroid nodule    Status post surgery  . Ulcer of hard palate   . Vertigo    Past Surgical History:  Procedure Laterality Date  . ABDOMINAL HYSTERECTOMY  1968  . APPENDECTOMY  1940's  . COLONOSCOPY    . FOOT SURGERY Left   . HARDWARE REMOVAL Right 02/16/2016   Procedure: HARDWARE REMOVAL RT HIP;  Surgeon: Sheral Apleyimothy D Murphy, MD;  [facility];  Service: Orthopedics;  Laterality: Right;  . HIP ARTHROPLASTY Right 02/16/2016   Procedure: ARTHROPLASTY BIPOLAR HIP (HEMIARTHROPLASTY);  Surgeon: Sheral Apleyimothy D Murphy, MD;  [facility];  Service: Orthopedics;  Laterality: Right;  . INTRAMEDULLARY (IM) NAIL INTERTROCHANTERIC Right 09/11/2015   Procedure: INTRAMEDULLARY (IM) NAIL INTERTROCHANTRIC;  Surgeon: Sheral Apleyimothy D Murphy, MD;  Location: [facility] OR;  Service: Orthopedics;  Laterality: Right;  . KNEE SURGERY      Allergies  Allergen Reactions  . Benadryl [Diphenhydramine] Other (See Comments)  Muscular degeneration  . Penicillins Other (See Comments)    Patient doesn't recall reaction Has patient had a PCN reaction causing immediate rash, facial/tongue/throat swelling, SOB or lightheadedness with hypotension: Unk Has patient had a PCN reaction causing severe rash involving mucus membranes or skin necrosis: Unk Has patient had a PCN reaction that required hospitalization: Unk Has patient had a PCN reaction occurring within the last 10 years: No If all of the  above answers are "NO", then may proceed with Cephalosporin use.     Outpatient Encounter Medications as of 05/09/2018  Medication Sig  . acetaminophen (TYLENOL) 500 MG tablet Take 500 mg by mouth every 6 (six) hours as needed (for pain, headaches, or fever).   Marland Kitchen amLODipine (NORVASC) 5 MG tablet Take 1 tablet (5 mg total) by mouth daily.  Marland Kitchen aspirin 325 MG tablet Take 325 mg by mouth daily.  Marland Kitchen atorvastatin (LIPITOR) 10 MG tablet Take 10 mg by mouth daily.  . Biotin 16109 MCG TABS Take 10,000 mcg by mouth daily.   . calcium carbonate (OSCAL) 1500 (600 Ca) MG TABS tablet Take 600 mg of elemental calcium by mouth 2 (two) times daily with a meal.  . Cholecalciferol (VITAMIN D3) 2000 units TABS Take 2,000 Units by mouth daily.  Marland Kitchen docusate sodium (COLACE) 100 MG capsule Take 100 mg by mouth 2 (two) times daily.   Marland Kitchen EPINEPHrine 0.3 mg/0.3 mL IJ SOAJ injection Inject 0.3 mLs (0.3 mg total) into the muscle once as needed (anaphylaxis/allergic reaction).  . meclizine (ANTIVERT) 12.5 MG tablet Take 12.5 mg by mouth 2 (two) times daily.   . Melatonin 3 MG TABS Take 1 tablet by mouth at bedtime.  . Multiple Vitamins-Minerals (PRESERVISION AREDS 2 PO) Take 2 capsules by mouth daily.   . Multiple Vitamins-Minerals (THEREMS-M) TABS Take 1 tablet by mouth daily.  . polyethylene glycol (MIRALAX / GLYCOLAX) packet Take 17 g by mouth every other day. HOLD FOR LOOSE STOOLS  . [DISCONTINUED] ferrous gluconate (FERGON) 324 MG tablet Take 1 tablet (324 mg total) by mouth daily with breakfast.   No facility-administered encounter medications on file as of 05/09/2018.    ROS was provided with assistance of staff and family.  Review of Systems  Constitutional: Negative for activity change, appetite change, chills, diaphoresis and fatigue.  HENT: Positive for hearing loss. Negative for congestion and voice change.   Eyes: Negative for visual disturbance.  Cardiovascular: Positive for leg swelling. Negative for chest  pain and palpitations.  Musculoskeletal: Positive for gait problem.  Skin: Positive for color change and wound.       Left foot.   Neurological: Negative for dizziness, speech difficulty, weakness and headaches.       Memory lapses, cognitive impairment.   Psychiatric/Behavioral: Negative for agitation, behavioral problems, hallucinations and sleep disturbance. The patient is not nervous/anxious.     Immunization History  Administered Date(s) Administered  . Influenza-Unspecified 09/07/2016, 09/12/2017  . PPD Test 02/18/2016  . Tdap 10/07/2012   Pertinent  Health Maintenance Due  Topic Date Due  . PNA vac Low Risk Adult (1 of 2 - PCV13) 04/15/1991  . INFLUENZA VACCINE  06/21/2018  . DEXA SCAN  Completed   Fall Risk  08/03/2017 10/24/2014  Falls in the past year? No Yes  Number falls in past yr: - 1  Injury with Fall? - Yes  Risk Factor Category  - High Fall Risk  Risk for fall due to : - History of fall(s)   Functional Status Survey:  Vitals:   05/09/18 1209  BP: (!) 125/55  Pulse: 66  Resp: 18  Temp: 98.5 F (36.9 C)  Weight: 124 lb 9.6 oz (56.5 kg)  Height: 5\' 1"  (1.549 m)   Body mass index is 23.54 kg/m. Physical Exam  Constitutional: She appears well-developed and well-nourished.  HENT:  Head: Normocephalic and atraumatic.  Eyes: Pupils are equal, round, and reactive to light. EOM are normal.  Neck: Normal range of motion. Neck supple. No JVD present.  Musculoskeletal: She exhibits edema and tenderness.  Medial left ankle and mid foot mild erythema, swelling, and pain when palpated. The previous burnt wound area is scabbed over and ready peeling off. Self transfer, ambulates with cane.   Neurological: She is alert. No cranial nerve deficit. She exhibits normal muscle tone. Coordination normal.  Oriented to person and place  Skin: Skin is warm and dry. There is erythema.  Medial left ankle previous burnt wound is scabbed over and ready pulling off. Medial left  mid and hind foot mild erythema, swelling, and warmth.   Psychiatric: She has a normal mood and affect. Her behavior is normal.    Labs reviewed: Recent Labs    01/13/18 2022  02/20/18 03/14/18 1533 03/14/18 1725 03/15/18 0315  NA 132*   < > 135* 127*  --  132*  K 3.3*   < > 4.6 4.0  --  3.8  CL 99*  --   --  95*  --  100*  CO2 23  --   --  23  --  22  GLUCOSE 130*  --   --  95  --  96  BUN 14   < > 13 12  --  11  CREATININE 0.67   < > 0.7 0.64  --  0.63  CALCIUM 9.6  --   --  9.7  --  9.1  MG  --   --   --   --  2.0 1.9   < > = values in this interval not displayed.   Recent Labs    02/20/18 03/14/18 1533 03/15/18 0315  AST 21 30 26   ALT 14 20 18   ALKPHOS 47 51 48  BILITOT  --  0.8 0.8  PROT  --  7.3 6.5  ALBUMIN  --  4.2 3.8   Recent Labs    03/14/18 1533 03/14/18 1831 03/15/18 0315  WBC 7.1 7.4 6.9  NEUTROABS 4.9  --   --   HGB 13.4 13.3 12.2  HCT 37.0 37.7 35.1*  MCV 89.4 89.1 88.4  PLT 289 301 279   Lab Results  Component Value Date   TSH 1.876 03/14/2018   Lab Results  Component Value Date   HGBA1C 5.4 03/14/2018   Lab Results  Component Value Date   CHOL 179 03/14/2018   HDL 70 03/14/2018   LDLCALC 95 03/14/2018   TRIG 69 03/14/2018   CHOLHDL 2.6 03/14/2018    Significant Diagnostic Results in last 30 days:  No results found.  Assessment/Plan Memory deficit 01/03/18 MMSE 27/30. Her memory and cognition appeared declining gradually in the past a few months, will update MMSE  Burn of left foot, second degree, subsequent encounter She complained a mild pain in the left foot, previously burnt wound at the medial aspect of the left heel is healing nicely, lateral left medial heel and mid foot remains mild redness, swelling, and pain which appeared inflammation instead of infection. Will apply BioFreeze cream qid to the affected area  qid x 7 days.   Advanced care planning/counseling discussion Goals of care discussion: reviewed goals of care  with the patient, the patient's representatives: Phillips Hay friend present at the meeting, Elisama Thissen niece over the phone conference, social worker, and nurse supervision from [place]. The patient desires to Full Code instead of previous DNR. The patient would like more time and reading materials to made decisions and fill out MOST at later time. Time spend from 1:40pm to 2:10pm to review living wills and answer questions.      Family/ staff Communication: plan of care reviewed with the patient, the patient's friend representative Fannie Knee, niece Landscape architect, Child psychotherapist, and Press photographer.    Labs/tests ordered:  none  Time spend 25 minutes.

## 2018-05-09 NOTE — Assessment & Plan Note (Addendum)
She complained a mild pain in the left foot, previously burnt wound at the medial aspect of the left heel is healing nicely, lateral left medial heel and mid foot remains mild redness, swelling, and pain which appeared inflammation instead of infection. Will apply BioFreeze cream qid to the affected area qid x 7 days.

## 2018-05-09 NOTE — Assessment & Plan Note (Signed)
Goals of care discussion: reviewed goals of care with the patient, the patient's representatives: Candice HaySue Willis friend present at the meeting, Candice MerlJoAn Willis niece over the phone conference, social worker, and nurse supervision from Candice Willis. The patient desires to Full Code instead of previous DNR. The patient would like more time and reading materials to made decisions and fill out MOST at later time. Time spend from 1:40pm to 2:10pm to review living wills and answer questions.

## 2018-05-09 NOTE — Assessment & Plan Note (Signed)
01/03/18 MMSE 27/30. Her memory and cognition appeared declining gradually in the past a few months, will update MMSE

## 2018-05-14 ENCOUNTER — Encounter: Payer: Self-pay | Admitting: *Deleted

## 2018-07-04 ENCOUNTER — Encounter: Payer: Self-pay | Admitting: Internal Medicine

## 2018-07-06 ENCOUNTER — Encounter: Payer: Self-pay | Admitting: Internal Medicine

## 2018-07-06 ENCOUNTER — Non-Acute Institutional Stay: Payer: Medicare Other | Admitting: Internal Medicine

## 2018-07-06 DIAGNOSIS — R269 Unspecified abnormalities of gait and mobility: Secondary | ICD-10-CM | POA: Diagnosis not present

## 2018-07-06 DIAGNOSIS — K5909 Other constipation: Secondary | ICD-10-CM

## 2018-07-06 DIAGNOSIS — D509 Iron deficiency anemia, unspecified: Secondary | ICD-10-CM

## 2018-07-06 DIAGNOSIS — I10 Essential (primary) hypertension: Secondary | ICD-10-CM

## 2018-07-06 DIAGNOSIS — H8110 Benign paroxysmal vertigo, unspecified ear: Secondary | ICD-10-CM | POA: Diagnosis not present

## 2018-07-06 NOTE — Progress Notes (Signed)
Location:  [facility] Room Number: 822 Place of Service:  ALF [phone] Provider:  Oneal GroutMahima Cameren Earnest MD  Oneal GroutPandey, Bellina Tokarczyk, MD  Patient Care Team: Oneal GroutPandey, Remijio Holleran, MD as PCP - General (Internal Medicine) Nahser, Deloris PingPhilip J, MD as PCP - Cardiology (Cardiology) Venita LickBrooks, Dahari, MD as Consulting Physician (Orthopedic Surgery) Marykay LexHarding, David W, MD as Consulting Physician (Cardiology) Mast, Man X, NP as Nurse Practitioner (Internal Medicine)  Extended Emergency Contact Information Primary Emergency Contact: [name],Rorie Address: [address]           [place], [place] [id number] [place] Mobile Phone: [phone] Relation: Niece Secondary Emergency Contact: [name],[name]  [place] Mobile Phone: [phone] Relation: Friend  Code Status:  DNR  Goals of care: Advanced Directive information Advanced Directives 07/06/2018  Does Patient Have a Medical Advance Directive? Yes  Type of Advance Directive Out of facility DNR (pink MOST or yellow form)  Does patient want to make changes to medical advance directive? No - Patient declined  Copy of Healthcare Power of Attorney in Chart? -  Would patient like information on creating a medical advance directive? -  Pre-existing out of facility DNR order (yellow form or pink MOST form) Yellow form placed in chart (order not valid for inpatient use)     Chief Complaint  Patient presents with  . Medical Management of Chronic Issues    Routine Visit     HPI:  Pt is a 84 y.o. female seen today for medical management of chronic diseases. She denies any concern.  Unsteady gait- no fall reported. Uses walker for ambulation. Occasional dizziness. Takes oscal.   Iron deficiency- takes ferrous sulfate 324 mg daily.   BPV- on meclizine 12.5 mg bid  Chronic constipation- taking miralax every other day and colace 100 mg bid.   Essential hypertension- takes amlodipine 5 mg daily, BP stable on review. Takes  atorvastatin 10 mg daily.    Past Medical History:  Diagnosis Date  . Arteriosclerotic cardiovascular disease   . Bee sting reaction 08/23/2016   Tongue swelling and difficulty swallowing /notes 08/23/2016  . Constipation   . Degenerative joint disease of right hip 10/15/14  . Fracture of multiple pubic rami (HCC) 10/15/14   Right inferior and superior pubic rami  . HOH (hard of hearing)    Wears bilateral hearing aids  . Hyperlipidemia   . Hypertension   . Macular degeneration   . Memory deficit   . Osteoporosis   . Pain in right foot   . Palpitations   . Paroxysmal atrial fibrillation (HCC)   . Thyroid nodule    Status post surgery  . Ulcer of hard palate   . Vertigo    Past Surgical History:  Procedure Laterality Date  . ABDOMINAL HYSTERECTOMY  1968  . APPENDECTOMY  1940's  . COLONOSCOPY    . FOOT SURGERY Left   . HARDWARE REMOVAL Right 02/16/2016   Procedure: HARDWARE REMOVAL RT HIP;  Surgeon: Sheral Apleyimothy D Murphy, MD;  [facility];  Service: Orthopedics;  Laterality: Right;  . HIP ARTHROPLASTY Right 02/16/2016   Procedure: ARTHROPLASTY BIPOLAR HIP (HEMIARTHROPLASTY);  Surgeon: Sheral Apleyimothy D Murphy, MD;  [facility];  Service: Orthopedics;  Laterality: Right;  . INTRAMEDULLARY (IM) NAIL INTERTROCHANTERIC Right 09/11/2015   Procedure: INTRAMEDULLARY (IM) NAIL INTERTROCHANTRIC;  Surgeon: Sheral Apleyimothy D Murphy, MD;  Location: [facility] OR;  Service: Orthopedics;  Laterality: Right;  . KNEE SURGERY      Allergies  Allergen Reactions  . Benadryl [  Diphenhydramine] Other (See Comments)    Muscular degeneration  . Penicillins Other (See Comments)    Patient doesn't recall reaction Has patient had a PCN reaction causing immediate rash, facial/tongue/throat swelling, SOB or lightheadedness with hypotension: Unk Has patient had a PCN reaction causing severe rash involving mucus membranes or skin necrosis: Unk Has patient had a PCN reaction that required hospitalization: Unk Has patient  had a PCN reaction occurring within the last 10 years: No If all of the above answers are "NO", then may proceed with Cephalosporin use.     Outpatient Encounter Medications as of 07/06/2018  Medication Sig  . acetaminophen (TYLENOL) 500 MG tablet Take 500 mg by mouth every 6 (six) hours as needed (for pain, headaches, or fever).   Marland Kitchen. amLODipine (NORVASC) 5 MG tablet Take 1 tablet (5 mg total) by mouth daily.  Marland Kitchen. aspirin 325 MG tablet Take 325 mg by mouth daily.  Marland Kitchen. atorvastatin (LIPITOR) 10 MG tablet Take 10 mg by mouth daily.  . Biotin 1610910000 MCG TABS Take 10,000 mcg by mouth daily.   . calcium carbonate (OSCAL) 1500 (600 Ca) MG TABS tablet Take 600 mg of elemental calcium by mouth 2 (two) times daily with a meal.  . Cholecalciferol (VITAMIN D3) 2000 units TABS Take 2,000 Units by mouth daily.  Marland Kitchen. docusate sodium (COLACE) 100 MG capsule Take 100 mg by mouth 2 (two) times daily.   Marland Kitchen. EPINEPHrine 0.3 mg/0.3 mL IJ SOAJ injection Inject 0.3 mLs (0.3 mg total) into the muscle once as needed (anaphylaxis/allergic reaction).  . ferrous sulfate 324 (65 Fe) MG TBEC Take 324 mg by mouth daily.  . meclizine (ANTIVERT) 12.5 MG tablet Take 12.5 mg by mouth 2 (two) times daily.   . Melatonin 3 MG TABS Take 3 mg by mouth at bedtime as needed.   . Multiple Vitamins-Minerals (PRESERVISION AREDS 2 PO) Take 2 capsules by mouth daily.   . Multiple Vitamins-Minerals (THEREMS-M) TABS Take 1 tablet by mouth daily.  . polyethylene glycol (MIRALAX / GLYCOLAX) packet Take 17 g by mouth every other day. HOLD FOR LOOSE STOOLS   No facility-administered encounter medications on file as of 07/06/2018.     Review of Systems  Constitutional: Negative for appetite change, chills, fatigue and fever.  HENT: Positive for hearing loss. Negative for congestion, rhinorrhea, sinus pain, sore throat and trouble swallowing.   Eyes: Positive for visual disturbance.  Respiratory: Negative for cough, shortness of breath and wheezing.     Cardiovascular: Negative for chest pain and palpitations.  Gastrointestinal: Positive for constipation. Negative for abdominal pain, blood in stool, diarrhea, nausea and vomiting.  Genitourinary: Negative for dysuria and pelvic pain.  Musculoskeletal: Positive for arthralgias and gait problem.  Skin: Negative for rash.  Neurological: Negative for dizziness and headaches.  Psychiatric/Behavioral: Positive for confusion. Negative for behavioral problems.    Immunization History  Administered Date(s) Administered  . Influenza-Unspecified 09/07/2016, 09/12/2017  . PPD Test 02/18/2016  . Tdap 10/07/2012   Pertinent  Health Maintenance Due  Topic Date Due  . PNA vac Low Risk Adult (1 of 2 - PCV13) 04/15/1991  . INFLUENZA VACCINE  06/21/2018  . DEXA SCAN  Completed   Fall Risk  08/03/2017 10/24/2014  Falls in the past year? No Yes  Number falls in past yr: - 1  Injury with Fall? - Yes  Risk Factor Category  - High Fall Risk  Risk for fall due to : - History of fall(s)   Functional Status Survey:  Vitals:   07/06/18 1035  BP: 126/70  Pulse: 79  Resp: 18  Temp: 98.9 F (37.2 C)  TempSrc: Oral  SpO2: 94%  Weight: 122 lb 6.4 oz (55.5 kg)  Height: 5' 1.5" (1.562 m)   Body mass index is 22.75 kg/m.   Wt Readings from Last 3 Encounters:  07/06/18 122 lb 6.4 oz (55.5 kg)  05/09/18 124 lb 9.6 oz (56.5 kg)  04/18/18 122 lb 12.8 oz (55.7 kg)   Physical Exam  Constitutional: She appears well-developed and well-nourished. No distress.  HENT:  Head: Normocephalic and atraumatic.  Nose: Nose normal.  Mouth/Throat: Oropharynx is clear and moist. No oropharyngeal exudate.  Eyes: Pupils are equal, round, and reactive to light. Conjunctivae and EOM are normal. Right eye exhibits no discharge. Left eye exhibits no discharge.  Neck: Normal range of motion. Neck supple.  Cardiovascular: Normal rate and regular rhythm.  Pulmonary/Chest: Effort normal and breath sounds normal. She has  no wheezes. She has no rales.  Abdominal: Soft. Bowel sounds are normal. There is no tenderness. There is no guarding.  Musculoskeletal: Normal range of motion. She exhibits no edema.  Lymphadenopathy:    She has no cervical adenopathy.  Neurological: She is alert.  Oriented to person and place  Skin: Skin is warm and dry. She is not diaphoretic.  Psychiatric: She has a normal mood and affect.    Labs reviewed: Recent Labs    01/13/18 2022  02/20/18 03/14/18 1533 03/14/18 1725 03/15/18 0315  NA 132*   < > 135* 127*  --  132*  K 3.3*   < > 4.6 4.0  --  3.8  CL 99*  --   --  95*  --  100*  CO2 23  --   --  23  --  22  GLUCOSE 130*  --   --  95  --  96  BUN 14   < > 13 12  --  11  CREATININE 0.67   < > 0.7 0.64  --  0.63  CALCIUM 9.6  --   --  9.7  --  9.1  MG  --   --   --   --  2.0 1.9   < > = values in this interval not displayed.   Recent Labs    02/20/18 03/14/18 1533 03/15/18 0315  AST 21 30 26   ALT 14 20 18   ALKPHOS 47 51 48  BILITOT  --  0.8 0.8  PROT  --  7.3 6.5  ALBUMIN  --  4.2 3.8   Recent Labs    03/14/18 1533 03/14/18 1831 03/15/18 0315  WBC 7.1 7.4 6.9  NEUTROABS 4.9  --   --   HGB 13.4 13.3 12.2  HCT 37.0 37.7 35.1*  MCV 89.4 89.1 88.4  PLT 289 301 279   Lab Results  Component Value Date   TSH 1.876 03/14/2018   Lab Results  Component Value Date   HGBA1C 5.4 03/14/2018   Lab Results  Component Value Date   CHOL 179 03/14/2018   HDL 70 03/14/2018   LDLCALC 95 03/14/2018   TRIG 69 03/14/2018   CHOLHDL 2.6 03/14/2018    Significant Diagnostic Results in last 30 days:  No results found.  Assessment/Plan  1. Essential hypertension Continue amlodipine 5 mg daily, reviewed BP and monitor, check bmp  2. Chronic constipation Continue colace daily with miralax every other day, maintain hydration  3. Benign paroxysmal positional vertigo, unspecified laterality Continue  meclizine and monitor  4. Gait abnormality Walker for safe  ambulation, fall precautions  5. Iron deficiency anemia, unspecified iron deficiency anemia type Continue iron supplement, check cbc   Family/ staff Communication: reviewed care plan with patient and charge nurse.    Labs/tests ordered:  Cbc, cmp 07/10/18   Oneal Grout, MD Internal Medicine [facility] [address] [place], [place] [address] Cell Phone (Monday-Friday 8 am - 5 pm): [phone] On Call: [phone] and follow prompts after 5 pm and on weekends Office Phone: [phone] Office Fax: [phone]

## 2018-08-17 ENCOUNTER — Non-Acute Institutional Stay: Payer: Medicare Other

## 2018-08-17 DIAGNOSIS — Z0000 Encounter for general adult medical examination without abnormal findings: Secondary | ICD-10-CM

## 2018-08-17 NOTE — Progress Notes (Signed)
Subjective:   Candice Willis is a 84 y.o. female who presents for Medicare Annual (Subsequent) preventive examination at [facility]  Last AWV-08/03/2017    Objective:     Vitals: BP 128/65 (BP Location: Left Arm, Patient Position: Sitting)   Pulse 78   Temp 98.5 F (36.9 C) (Oral)   Ht 5\' 1"  (1.549 m)   Wt 122 lb (55.3 kg)   BMI 23.05 kg/m   Body mass index is 23.05 kg/m.  Advanced Directives 08/17/2018 07/06/2018 05/09/2018 04/03/2018 03/14/2018 02/19/2018 01/13/2018  Does Patient Have a Medical Advance Directive? Yes Yes Yes Yes No Yes Yes  Type of Advance Directive Out of facility DNR (pink MOST or yellow form) Out of facility DNR (pink MOST or yellow form) Out of facility DNR (pink MOST or yellow form) Out of facility DNR (pink MOST or yellow form) - Out of facility DNR (pink MOST or yellow form) Out of facility DNR (pink MOST or yellow form)  Does patient want to make changes to medical advance directive? No - Patient declined No - Patient declined No - Patient declined No - Patient declined - No - Patient declined -  Copy of Healthcare Power of Attorney in Chart? - - - - - - -  Would patient like information on creating a medical advance directive? - - - Yes (Inpatient - patient requests chaplain consult to create a medical advance directive) Yes (Inpatient - patient requests chaplain consult to create a medical advance directive) - -  Pre-existing out of facility DNR order (yellow form or pink MOST form) Yellow form placed in chart (order not valid for inpatient use) Yellow form placed in chart (order not valid for inpatient use) Yellow form placed in chart (order not valid for inpatient use) Yellow form placed in chart (order not valid for inpatient use) - Yellow form placed in chart (order not valid for inpatient use) -    Tobacco Social History   Tobacco Use  Smoking Status Former Smoker  . Packs/day: 0.50  . Types: Cigarettes  . Last attempt to  quit: 10/05/1971  . Years since quitting: 46.8  Smokeless Tobacco Never Used     Counseling given: Not Answered   Clinical Intake:  Pre-visit preparation completed: No  Pain : No/denies pain     Diabetes: No  How often do you need to have someone help you when you read instructions, pamphlets, or other written materials from your doctor or pharmacy?: 2 - Rarely What is the last grade level you completed in school?: College  Interpreter Needed?: No  Information entered by :: Tyron Russell, RN  Past Medical History:  Diagnosis Date  . Arteriosclerotic cardiovascular disease   . Bee sting reaction 08/23/2016   Tongue swelling and difficulty swallowing /notes 08/23/2016  . Constipation   . Degenerative joint disease of right hip 10/15/14  . Fracture of multiple pubic rami (HCC) 10/15/14   Right inferior and superior pubic rami  . HOH (hard of hearing)    Wears bilateral hearing aids  . Hyperlipidemia   . Hypertension   . Macular degeneration   . Memory deficit   . Osteoporosis   . Pain in right foot   . Palpitations   . Paroxysmal atrial fibrillation (HCC)   . Thyroid nodule    Status post surgery  . Ulcer of hard palate   . Vertigo    Past Surgical History:  Procedure Laterality Date  . ABDOMINAL HYSTERECTOMY  1968  .  APPENDECTOMY  1940's  . COLONOSCOPY    . FOOT SURGERY Left   . HARDWARE REMOVAL Right 02/16/2016   Procedure: HARDWARE REMOVAL RT HIP;  Surgeon: Sheral Apley, MD;  Location[facility];  Service: Orthopedics;  Laterality: Right;  . HIP ARTHROPLASTY Right 02/16/2016   Procedure: ARTHROPLASTY BIPOLAR HIP (HEMIARTHROPLASTY);  Surgeon: Sheral Apley, MD;  Location: [facility] OR;  Service: Orthopedics;  Laterality: Right;  . INTRAMEDULLARY (IM) NAIL INTERTROCHANTERIC Right 09/11/2015   Procedure: INTRAMEDULLARY (IM) NAIL INTERTROCHANTRIC;  Surgeon: Sheral Apley, MD;  Location: [facility] OR;  Service: Orthopedics;  Laterality: Right;  . KNEE SURGERY     Family  History  Problem Relation Age of Onset  . Heart disease Father   . Cancer Father        prostate  . Cancer Maternal Grandmother        colon  . Heart disease Paternal Grandmother   . Hyperlipidemia Sister   . Cancer Sister        breast  . Heart disease Brother   . Hyperlipidemia Brother   . Cancer Brother        prostate   Social History   Socioeconomic History  . Marital status: Single    Spouse name: Not on file  . Number of children: 0  . Years of education: 16+  . Highest education level: Not on file  Occupational History  . Occupation: Retired   Engineer, production  . Financial resource strain: Not hard at all  . Food insecurity:    Worry: Never true    Inability: Never true  . Transportation needs:    Medical: No    Non-medical: No  Tobacco Use  . Smoking status: Former Smoker    Packs/day: 0.50    Types: Cigarettes    Last attempt to quit: 10/05/1971    Years since quitting: 46.8  . Smokeless tobacco: Never Used  Substance and Sexual Activity  . Alcohol use: Yes    Comment: 1-4 per week; eats 10 raisins daily soaked in Gin  . Drug use: No  . Sexual activity: Never  Lifestyle  . Physical activity:    Days per week: 7 days    Minutes per session: 30 min  . Stress: Not at all  Relationships  . Social connections:    Talks on phone: More than three times a week    Gets together: More than three times a week    Attends religious service: Never    Active member of club or organization: No    Attends meetings of clubs or organizations: Never    Relationship status: Never married  Other Topics Concern  . Not on file  Social History Narrative   Lives at [facility]. Moved to [place] 07/06/16   Never married   Caffeine use: 2 cups coffee per day   No tea/soda   Former smoker 1972   Alcohol 1-4 per week. Eats 10 raisins daily soaked in Gin   DNR    Outpatient Encounter Medications as of 08/17/2018  Medication Sig  . acetaminophen (TYLENOL) 500 MG  tablet Take 500 mg by mouth every 6 (six) hours as needed (for pain, headaches, or fever).   Marland Kitchen amLODipine (NORVASC) 5 MG tablet Take 1 tablet (5 mg total) by mouth daily.  Marland Kitchen aspirin 325 MG tablet Take 325 mg by mouth daily.  Marland Kitchen atorvastatin (LIPITOR) 10 MG tablet Take 10 mg by mouth daily.  . Biotin 16109 MCG TABS  Take 10,000 mcg by mouth daily.   . calcium carbonate (OSCAL) 1500 (600 Ca) MG TABS tablet Take 600 mg of elemental calcium by mouth 2 (two) times daily with a meal.  . Cholecalciferol (VITAMIN D3) 2000 units TABS Take 2,000 Units by mouth daily.  Marland Kitchen docusate sodium (COLACE) 100 MG capsule Take 100 mg by mouth 2 (two) times daily.   Marland Kitchen EPINEPHrine 0.3 mg/0.3 mL IJ SOAJ injection Inject 0.3 mLs (0.3 mg total) into the muscle once as needed (anaphylaxis/allergic reaction).  . ferrous sulfate 324 (65 Fe) MG TBEC Take 324 mg by mouth daily.  . meclizine (ANTIVERT) 12.5 MG tablet Take 12.5 mg by mouth 2 (two) times daily.   . Melatonin 3 MG TABS Take 3 mg by mouth at bedtime as needed.   . Multiple Vitamins-Minerals (PRESERVISION AREDS 2 PO) Take 2 capsules by mouth daily.   . Multiple Vitamins-Minerals (THEREMS-M) TABS Take 1 tablet by mouth daily.  . polyethylene glycol (MIRALAX / GLYCOLAX) packet Take 17 g by mouth every other day. HOLD FOR LOOSE STOOLS   No facility-administered encounter medications on file as of 08/17/2018.     Activities of Daily Living In your present state of health, do you have any difficulty performing the following activities: 08/17/2018 03/14/2018  Hearing? Malvin Johns  Vision? N N  Difficulty concentrating or making decisions? N Y  Walking or climbing stairs? N Y  Dressing or bathing? N N  Doing errands, shopping? N Y  Quarry manager and eating ? N -  Using the Toilet? N -  In the past six months, have you accidently leaked urine? N -  Do you have problems with loss of bowel control? Y -  Managing your Medications? Y -  Managing your Finances? Y -  Housekeeping  or managing your Housekeeping? Y -  Some recent data might be hidden    Patient Care Team: Oneal Grout, MD as PCP - General (Internal Medicine) Nahser, Deloris Ping, MD as PCP - Cardiology (Cardiology) Venita Lick, MD as Consulting Physician (Orthopedic Surgery) Marykay Lex, MD as Consulting Physician (Cardiology) Mast, Man X, NP as Nurse Practitioner (Internal Medicine)    Assessment:   This is a routine wellness examination for Candice Willis.  Exercise Activities and Dietary recommendations Current Exercise Habits: Home exercise routine, Type of exercise: walking, Time (Minutes): 30, Frequency (Times/Week): 7, Weekly Exercise (Minutes/Week): 210, Intensity: Mild, Exercise limited by: None identified  Goals   None     Fall Risk Fall Risk  08/17/2018 08/03/2017 10/24/2014  Falls in the past year? No No Yes  Number falls in past yr: - - 1  Injury with Fall? - - Yes  Risk Factor Category  - - High Fall Risk  Risk for fall due to : - - History of fall(s)   Is the patient's home free of loose throw rugs in walkways, pet beds, electrical cords, etc?   yes      Grab bars in the bathroom? yes      Handrails on the stairs?   yes      Adequate lighting?   yes  Depression Screen PHQ 2/9 Scores 08/17/2018 08/03/2017 10/24/2014  PHQ - 2 Score 0 0 0     Cognitive Function completed within last year MMSE - Mini Mental State Exam 05/14/2018 01/04/2018 12/29/2016 12/29/2016  Orientation to time 5 5 5 5   Orientation to Place 5 5 4 4   Registration 2 3 3 3   Attention/ Calculation 5 5 5  5  Recall 2 0 3 3  Language- name 2 objects 2 2 2 2   Language- repeat 1 1 0 0  Language- follow 3 step command 1 3 3 3   Language- read & follow direction 1 1 1 1   Write a sentence 0 1 1 1   Copy design 1 1 1 1   Total score 25 27 28 28         Immunization History  Administered Date(s) Administered  . Influenza-Unspecified 09/07/2016, 09/12/2017  . PPD Test 02/18/2016  . Pneumococcal Conjugate-13 09/18/2017    . Tdap 10/07/2012    Qualifies for Shingles Vaccine? Not in past records  Screening Tests Health Maintenance  Topic Date Due  . PNA vac Low Risk Adult (1 of 2 - PCV13) 04/15/1991  . INFLUENZA VACCINE  06/21/2018  . TETANUS/TDAP  10/07/2022  . DEXA SCAN  Completed    Cancer Screenings: Lung: Low Dose CT Chest recommended if Age 73-80 years, 30 pack-year currently smoking OR have quit w/in 15years. Patient does not qualify. Breast:  Up to date on Mammogram? Yes   Up to date of Bone Density/Dexa? Yes Colorectal: up to date  Additional Screenings:  Hepatitis C Screening: declined Flu vaccine due: will receive at [facility] Pneumovax [id number]    Plan:  I have personally reviewed and addressed the Medicare Annual Wellness questionnaire and have noted the following in the patient's chart:  A. Medical and social history B. Use of alcohol, tobacco or illicit drugs  C. Current medications and supplements D. Functional ability and status E.  Nutritional status F.  Physical activity G. Advance directives H. List of other physicians I.  Hospitalizations, surgeries, and ER visits in previous 12 months J.  Vitals K. Screenings to include hearing, vision, cognitive, depression L. Referrals and appointments - none  In addition, I have reviewed and discussed with patient certain preventive protocols, quality metrics, and best practice recommendations. A written personalized care plan for preventive services as well as general preventive health recommendations were provided to patient.  See attached scanned questionnaire for additional information.   Signed,   Tyron Russell, RN Nurse Health Advisor  Patient Concerns: None

## 2018-08-17 NOTE — Patient Instructions (Signed)
Candice Willis , Thank you for taking time to come for your Medicare Wellness Visit. I appreciate your ongoing commitment to your health goals. Please review the following plan we discussed and let me know if I can assist you in the future.   Screening recommendations/referrals: Colonoscopy excluded, over age 82 Mammogram excluded, over age 44 Bone Density up to date Recommended yearly ophthalmology/optometry visit for glaucoma screening and checkup Recommended yearly dental visit for hygiene and checkup  Vaccinations: Influenza vaccine due, will receive at [facility] Pneumococcal vaccine 23 due, ordered Tdap vaccine up to date, due 10/07/2022 Shingles vaccine not in past records    Advanced directives: in chart  Conditions/risks identified: none  Next appointment: Dr. Glade Lloyd makes rounds   Preventive Care 65 Years and Older, Female Preventive care refers to lifestyle choices and visits with your health care provider that can promote health and wellness. What does preventive care include?  A yearly physical exam. This is also called an annual well check.  Dental exams once or twice a year.  Routine eye exams. Ask your health care provider how often you should have your eyes checked.  Personal lifestyle choices, including:  Daily care of your teeth and gums.  Regular physical activity.  Eating a healthy diet.  Avoiding tobacco and drug use.  Limiting alcohol use.  Practicing safe sex.  Taking low-dose aspirin every day.  Taking vitamin and mineral supplements as recommended by your health care provider. What happens during an annual well check? The services and screenings done by your health care provider during your annual well check will depend on your age, overall health, lifestyle risk factors, and family history of disease. Counseling  Your health care provider may ask you questions about your:  Alcohol use.  Tobacco use.  Drug use.  Emotional  well-being.  Home and relationship well-being.  Sexual activity.  Eating habits.  History of falls.  Memory and ability to understand (cognition).  Work and work Astronomer.  Reproductive health. Screening  You may have the following tests or measurements:  Height, weight, and BMI.  Blood pressure.  Lipid and cholesterol levels. These may be checked every 5 years, or more frequently if you are over 60 years old.  Skin check.  Lung cancer screening. You may have this screening every year starting at age 53 if you have a 30-pack-year history of smoking and currently smoke or have quit within the past 15 years.  Fecal occult blood test (FOBT) of the stool. You may have this test every year starting at age 71.  Flexible sigmoidoscopy or colonoscopy. You may have a sigmoidoscopy every 5 years or a colonoscopy every 10 years starting at age 79.  Hepatitis C blood test.  Hepatitis B blood test.  Sexually transmitted disease (STD) testing.  Diabetes screening. This is done by checking your blood sugar (glucose) after you have not eaten for a while (fasting). You may have this done every 1-3 years.  Bone density scan. This is done to screen for osteoporosis. You may have this done starting at age 40.  Mammogram. This may be done every 1-2 years. Talk to your health care provider about how often you should have regular mammograms. Talk with your health care provider about your test results, treatment options, and if necessary, the need for more tests. Vaccines  Your health care provider may recommend certain vaccines, such as:  Influenza vaccine. This is recommended every year.  Tetanus, diphtheria, and acellular pertussis (Tdap, Td) vaccine. You  may need a Td booster every 10 years.  Zoster vaccine. You may need this after age 27.  Pneumococcal 13-valent conjugate (PCV13) vaccine. One dose is recommended after age 30.  Pneumococcal polysaccharide (PPSV23) vaccine. One  dose is recommended after age 65. Talk to your health care provider about which screenings and vaccines you need and how often you need them. This information is not intended to replace advice given to you by your health care provider. Make sure you discuss any questions you have with your health care provider. Document Released: 12/04/2015 Document Revised: 07/27/2016 Document Reviewed: 09/08/2015 Elsevier Interactive Patient Education  2017 [place] Prevention in the Home Falls can cause injuries. They can happen to people of all ages. There are many things you can do to make your home safe and to help prevent falls. What can I do on the outside of my home?  Regularly fix the edges of walkways and driveways and fix any cracks.  Remove anything that might make you trip as you walk through a door, such as a raised step or threshold.  Trim any bushes or trees on the path to your home.  Use bright outdoor lighting.  Clear any walking paths of anything that might make someone trip, such as rocks or tools.  Regularly check to see if handrails are loose or broken. Make sure that both sides of any steps have handrails.  Any raised decks and porches should have guardrails on the edges.  Have any leaves, snow, or ice cleared regularly.  Use sand or salt on walking paths during winter.  Clean up any spills in your garage right away. This includes oil or grease spills. What can I do in the bathroom?  Use night lights.  Install grab bars by the toilet and in the tub and shower. Do not use towel bars as grab bars.  Use non-skid mats or decals in the tub or shower.  If you need to sit down in the shower, use a plastic, non-slip stool.  Keep the floor dry. Clean up any water that spills on the floor as soon as it happens.  Remove soap buildup in the tub or shower regularly.  Attach bath mats securely with double-sided non-slip rug tape.  Do not have throw rugs and other  things on the floor that can make you trip. What can I do in the bedroom?  Use night lights.  Make sure that you have a light by your bed that is easy to reach.  Do not use any sheets or blankets that are too big for your bed. They should not hang down onto the floor.  Have a firm chair that has side arms. You can use this for support while you get dressed.  Do not have throw rugs and other things on the floor that can make you trip. What can I do in the kitchen?  Clean up any spills right away.  Avoid walking on wet floors.  Keep items that you use a lot in easy-to-reach places.  If you need to reach something above you, use a strong step stool that has a grab bar.  Keep electrical cords out of the way.  Do not use floor polish or wax that makes floors slippery. If you must use wax, use non-skid floor wax.  Do not have throw rugs and other things on the floor that can make you trip. What can I do with my stairs?  Do not leave any items  on the stairs.  Make sure that there are handrails on both sides of the stairs and use them. Fix handrails that are broken or loose. Make sure that handrails are as long as the stairways.  Check any carpeting to make sure that it is firmly attached to the stairs. Fix any carpet that is loose or worn.  Avoid having throw rugs at the top or bottom of the stairs. If you do have throw rugs, attach them to the floor with carpet tape.  Make sure that you have a light switch at the top of the stairs and the bottom of the stairs. If you do not have them, ask someone to add them for you. What else can I do to help prevent falls?  Wear shoes that:  Do not have high heels.  Have rubber bottoms.  Are comfortable and fit you well.  Are closed at the toe. Do not wear sandals.  If you use a stepladder:  Make sure that it is fully opened. Do not climb a closed stepladder.  Make sure that both sides of the stepladder are locked into place.  Ask  someone to hold it for you, if possible.  Clearly mark and make sure that you can see:  Any grab bars or handrails.  First and last steps.  Where the edge of each step is.  Use tools that help you move around (mobility aids) if they are needed. These include:  Canes.  Walkers.  Scooters.  Crutches.  Turn on the lights when you go into a dark area. Replace any light bulbs as soon as they burn out.  Set up your furniture so you have a clear path. Avoid moving your furniture around.  If any of your floors are uneven, fix them.  If there are any pets around you, be aware of where they are.  Review your medicines with your doctor. Some medicines can make you feel dizzy. This can increase your chance of falling. Ask your doctor what other things that you can do to help prevent falls. This information is not intended to replace advice given to you by your health care provider. Make sure you discuss any questions you have with your health care provider. Document Released: 09/03/2009 Document Revised: 04/14/2016 Document Reviewed: 12/12/2014 Elsevier Interactive Patient Education  2017 [employer].

## 2018-08-21 LAB — CBC AND DIFFERENTIAL
HEMATOCRIT: 37 (ref 36–46)
HEMOGLOBIN: 12.7 (ref 12.0–16.0)
PLATELETS: 297 (ref 150–399)
WBC: 5.3

## 2018-08-21 LAB — HEPATIC FUNCTION PANEL
ALK PHOS: 51 (ref 25–125)
ALT: 15 (ref 7–35)
AST: 24 (ref 13–35)
Bilirubin, Total: 0.6

## 2018-08-21 LAB — BASIC METABOLIC PANEL
BUN: 14 (ref 4–21)
Creatinine: 0.7 (ref <=1.1)
Glucose: 80
Potassium: 4.2 (ref 3.4–5.3)
Sodium: 133 — AB (ref 137–147)

## 2018-08-22 ENCOUNTER — Other Ambulatory Visit: Payer: Self-pay | Admitting: *Deleted

## 2018-08-22 LAB — COMPLETE METABOLIC PANEL WITH GFR
Albumin: 4.2
Calcium: 9.6
Carbon Dioxide, Total: 26
Chloride: 101
GFR CALC NON AF AMER: 71
Globulin: 2.5
Total Protein: 6.7 g/dL

## 2018-09-12 ENCOUNTER — Encounter: Payer: Self-pay | Admitting: Nurse Practitioner

## 2018-09-12 ENCOUNTER — Non-Acute Institutional Stay: Payer: Medicare Other | Admitting: Nurse Practitioner

## 2018-09-12 DIAGNOSIS — D509 Iron deficiency anemia, unspecified: Secondary | ICD-10-CM | POA: Diagnosis not present

## 2018-09-12 DIAGNOSIS — I10 Essential (primary) hypertension: Secondary | ICD-10-CM | POA: Diagnosis not present

## 2018-09-12 DIAGNOSIS — E871 Hypo-osmolality and hyponatremia: Secondary | ICD-10-CM

## 2018-09-12 DIAGNOSIS — H8110 Benign paroxysmal vertigo, unspecified ear: Secondary | ICD-10-CM | POA: Diagnosis not present

## 2018-09-12 DIAGNOSIS — K5909 Other constipation: Secondary | ICD-10-CM

## 2018-09-12 DIAGNOSIS — Z7189 Other specified counseling: Secondary | ICD-10-CM

## 2018-09-12 NOTE — Assessment & Plan Note (Addendum)
Goals of care discussion. Reviewed gaols of care the patient, the patient's HPOA Candice Willis niece. HPOA and living will documents on file. Social worker and Building control surveyor are also present. The patient desires to be DNR when the patient has no pulse and is not breathing. DNR form signed. The patient desires to be transfers to hospital if indicated and full scope of treatment in case of medical illness. The patient agreed to antibiotics if indicated when infection occurs, IV fluid and feeding tube if indicated when oral fluids and nutrition not physically feasible. Went over and signed MOST for between 3:40-4:05 pm. MOST form was signed by the patient's representative and myself.  Copies made for chart and family.

## 2018-09-12 NOTE — Assessment & Plan Note (Signed)
Mild, Na 133 08/21/18.

## 2018-09-12 NOTE — Assessment & Plan Note (Signed)
Hx of HTN, blood pressure is controlled,continue Amlodipine 5mg  qd.

## 2018-09-12 NOTE — Progress Notes (Signed)
Location:  [facility] Room Number: 822 Place of Service:  ALF [phone]) Provider:  Chipper Oman  NP  Mast, Man X, NP  Patient Care Team: Mast, Man X, NP as PCP - General (Internal Medicine) Nahser, Deloris Ping, MD as PCP - Cardiology (Cardiology) Venita Lick, MD as Consulting Physician (Orthopedic Surgery) Marykay Lex, MD as Consulting Physician (Cardiology) Mast, Man X, NP as Nurse Practitioner (Internal Medicine)  Extended Emergency Contact Information Primary Emergency Contact: [name],Romanita Address: [address]           [place], [place] [address] [place] Home Phone: [phone] Mobile Phone: [phone] Relation: Niece Secondary Emergency Contact: [name],[name]  [place] Mobile Phone: [phone] Relation: Friend  Code Status:  DNR Goals of care: Advanced Directive information Advanced Directives 09/12/2018  Does Patient Have a Medical Advance Directive? Yes  Type of Advance Directive Out of facility DNR (pink MOST or yellow form)  Does patient want to make changes to medical advance directive? No - Patient declined  Copy of Healthcare Power of Attorney in Chart? -  Would patient like information on creating a medical advance directive? -  Pre-existing out of facility DNR order (yellow form or pink MOST form) Yellow form placed in chart (order not valid for inpatient use)     Chief Complaint  Patient presents with  . Acute Visit    ACP     HPI:  Pt is a 84 y.o. female seen today for an acute visit for goal of care discussion.    The patient has history of vertigo, less frequency and intensity of the events, she exercises daily,  Meclizine 12.5mg  bid is effective. Hx of anemia, Hgb 12.7 10/19,  on Fe 324mg  qd. No constipation while on Colace 100mg  bid. Hx of HTN, blood pressure is controlled on Amlodipine 5mg  qd. Hx of mild hyponatremia,  Na 133 08/21/18.    Past Medical History:  Diagnosis Date  .  Arteriosclerotic cardiovascular disease   . Bee sting reaction 08/23/2016   Tongue swelling and difficulty swallowing /notes 08/23/2016  . Constipation   . Degenerative joint disease of right hip 10/15/14  . Fracture of multiple pubic rami (HCC) 10/15/14   Right inferior and superior pubic rami  . HOH (hard of hearing)    Wears bilateral hearing aids  . Hyperlipidemia   . Hypertension   . Macular degeneration   . Memory deficit   . Osteoporosis   . Pain in right foot   . Palpitations   . Paroxysmal atrial fibrillation (HCC)   . Thyroid nodule    Status post surgery  . Ulcer of hard palate   . Vertigo    Past Surgical History:  Procedure Laterality Date  . ABDOMINAL HYSTERECTOMY  1968  . APPENDECTOMY  1940's  . COLONOSCOPY    . FOOT SURGERY Left   . HARDWARE REMOVAL Right 02/16/2016   Procedure: HARDWARE REMOVAL RT HIP;  Surgeon: Sheral Apley, MD;  Location: [facility] OR;  Service: Orthopedics;  Laterality: Right;  . HIP ARTHROPLASTY Right 02/16/2016   Procedure: ARTHROPLASTY BIPOLAR HIP (HEMIARTHROPLASTY);  Surgeon: Sheral Apley, MD;  Location: [facility] OR;  Service: Orthopedics;  Laterality: Right;  . INTRAMEDULLARY (IM) NAIL INTERTROCHANTERIC Right 09/11/2015   Procedure: INTRAMEDULLARY (IM) NAIL INTERTROCHANTRIC;  Surgeon: Sheral Apley, MD;  Location: [facility] OR;  Service: Orthopedics;  Laterality: Right;  . KNEE SURGERY      Allergies  Allergen Reactions  . Benadryl [Diphenhydramine]  Other (See Comments)    Muscular degeneration  . Penicillins Other (See Comments)    Patient doesn't recall reaction Has patient had a PCN reaction causing immediate rash, facial/tongue/throat swelling, SOB or lightheadedness with hypotension: Unk Has patient had a PCN reaction causing severe rash involving mucus membranes or skin necrosis: Unk Has patient had a PCN reaction that required hospitalization: Unk Has patient had a PCN reaction occurring within the last 10 years: No If all of the  above answers are "NO", then may proceed with Cephalosporin use.     Outpatient Encounter Medications as of 09/12/2018  Medication Sig  . acetaminophen (TYLENOL) 500 MG tablet Take 500 mg by mouth every 6 (six) hours as needed (for pain, headaches, or fever).   Marland Kitchen amLODipine (NORVASC) 5 MG tablet Take 1 tablet (5 mg total) by mouth daily.  Marland Kitchen aspirin 325 MG tablet Take 325 mg by mouth daily.  Marland Kitchen atorvastatin (LIPITOR) 10 MG tablet Take 10 mg by mouth daily.  . Biotin 16109 MCG TABS Take 10,000 mcg by mouth daily.   . calcium carbonate (OSCAL) 1500 (600 Ca) MG TABS tablet Take 600 mg of elemental calcium by mouth 2 (two) times daily with a meal.  . Cholecalciferol (VITAMIN D3) 2000 units TABS Take 2,000 Units by mouth daily.  Marland Kitchen docusate sodium (COLACE) 100 MG capsule Take 100 mg by mouth 2 (two) times daily.   Marland Kitchen EPINEPHrine 0.3 mg/0.3 mL IJ SOAJ injection Inject 0.3 mLs (0.3 mg total) into the muscle once as needed (anaphylaxis/allergic reaction).  . ferrous sulfate 324 (65 Fe) MG TBEC Take 324 mg by mouth daily.  . meclizine (ANTIVERT) 12.5 MG tablet Take 12.5 mg by mouth 2 (two) times daily.   . Melatonin 3 MG TABS Take 3 mg by mouth at bedtime as needed.   . Multiple Vitamins-Minerals (PRESERVISION AREDS 2 PO) Take 2 capsules by mouth daily.   . Multiple Vitamins-Minerals (THEREMS-M) TABS Take 1 tablet by mouth daily.  . polyethylene glycol (MIRALAX / GLYCOLAX) packet Take 17 g by mouth every other day. HOLD FOR LOOSE STOOLS   No facility-administered encounter medications on file as of 09/12/2018.     Review of Systems  Constitutional: Negative for activity change, appetite change, chills, diaphoresis, fatigue and fever.  HENT: Positive for hearing loss. Negative for congestion and voice change.        Hearing aids. Right works better than the left.   Respiratory: Negative for cough, shortness of breath and wheezing.   Cardiovascular: Negative for chest pain and leg swelling.    Gastrointestinal: Negative for abdominal distention, abdominal pain, blood in stool, constipation, diarrhea, nausea and vomiting.  Genitourinary: Positive for frequency. Negative for difficulty urinating and dysuria.       2-3x/night urination  Musculoskeletal: Positive for arthralgias, back pain and gait problem.       Lower back and right hip pain, on and off, livable.   Skin: Positive for color change. Negative for pallor.       Chronic brownish pigmented BLE  Neurological: Positive for dizziness. Negative for facial asymmetry, speech difficulty, weakness and headaches.       Memory lapses. Chronic vertigo-doing exercise daily-less frequency and intensity of the events.  Psychiatric/Behavioral: Negative for agitation, behavioral problems, hallucinations and sleep disturbance. The patient is not nervous/anxious.     Immunization History  Administered Date(s) Administered  . Influenza-Unspecified 09/07/2016, 09/12/2017  . PPD Test 02/18/2016  . Pneumococcal Conjugate-13 09/18/2017  . Tdap 10/07/2012  Pertinent  Health Maintenance Due  Topic Date Due  . INFLUENZA VACCINE  06/21/2018  . PNA vac Low Risk Adult (2 of 2 - PPSV23) 09/18/2018  . DEXA SCAN  Completed   Fall Risk  08/17/2018 08/03/2017 10/24/2014  Falls in the past year? No No Yes  Number falls in past yr: - - 1  Injury with Fall? - - Yes  Risk Factor Category  - - High Fall Risk  Risk for fall due to : - - History of fall(s)   Functional Status Survey:    Vitals:   09/12/18 1039  BP: (!) 112/52  Pulse: 67  Resp: 14  Temp: 98 F (36.7 C)  SpO2: 96%  Weight: 126 lb 3.2 oz (57.2 kg)  Height: 5\' 1"  (1.549 m)   Body mass index is 23.85 kg/m. Physical Exam  Constitutional: She appears well-developed and well-nourished.  HENT:  Head: Normocephalic and atraumatic.  Eyes: Pupils are equal, round, and reactive to light. EOM are normal.  Neck: Normal range of motion. Neck supple. No JVD present. No thyromegaly  present.  Cardiovascular: Normal rate and regular rhythm.  No murmur heard. Pulmonary/Chest: Effort normal. She has no wheezes. She has no rales.  Abdominal: Soft. She exhibits no distension. There is no tenderness. There is no rebound and no guarding.  Musculoskeletal: She exhibits no edema.  Ambulates with cane  Neurological: She is alert.  Oriented to person and place.   Skin: Skin is warm and dry.  Chronic brownish pigmented BLE  Psychiatric: She has a normal mood and affect. Her behavior is normal. Judgment and thought content normal.    Labs reviewed: Recent Labs    01/13/18 2022  03/14/18 1533 03/14/18 1725 03/15/18 0315 08/21/18  NA 132*   < > 127*  --  132* 133*  K 3.3*   < > 4.0  --  3.8 4.2  CL 99*  --  95*  --  100* 101  CO2 23  --  23  --  22 26  GLUCOSE 130*  --  95  --  96  --   BUN 14   < > 12  --  11 14  CREATININE 0.67   < > 0.64  --  0.63 0.7  CALCIUM 9.6  --  9.7  --  9.1 9.6  MG  --   --   --  2.0 1.9  --    < > = values in this interval not displayed.   Recent Labs    03/14/18 1533 03/15/18 0315 08/21/18  AST 30 26 24   ALT 20 18 15   ALKPHOS 51 48 51  BILITOT 0.8 0.8  --   PROT 7.3 6.5 6.7  ALBUMIN 4.2 3.8 4.2   Recent Labs    03/14/18 1533 03/14/18 1831 03/15/18 0315 08/21/18  WBC 7.1 7.4 6.9 5.3  NEUTROABS 4.9  --   --   --   HGB 13.4 13.3 12.2 12.7  HCT 37.0 37.7 35.1* 37  MCV 89.4 89.1 88.4  --   PLT 289 301 279 297   Lab Results  Component Value Date   TSH 1.876 03/14/2018   Lab Results  Component Value Date   HGBA1C 5.4 03/14/2018   Lab Results  Component Value Date   CHOL 179 03/14/2018   HDL 70 03/14/2018   LDLCALC 95 03/14/2018   TRIG 69 03/14/2018   CHOLHDL 2.6 03/14/2018    Significant Diagnostic Results in last 30 days:  No results found.  Assessment/Plan Essential hypertension Hx of HTN, blood pressure is controlled,continue Amlodipine 5mg  qd.    Chronic constipation No constipation, continue Colace  100mg  bid.  Benign paroxysmal positional vertigo vertigo, less frequency and intensity of the events, she exercises daily, continue Meclizine 12.5mg  bid  Anemia, iron deficiency Hx of anemia, Hgb 12.7 10/19,  Continue Fe 324mg  qd.   Hyponatremia Mild, Na 133 08/21/18.   Advanced care planning/counseling discussion Goals of care discussion. Reviewed gaols of care the patient, the patient's HPOA Candice Willis niece. HPOA and living will documents on file. Social worker and Building control surveyor are also present. The patient desires to be DNR when the patient has no pulse and is not breathing. DNR form signed. The patient desires to be transfers to hospital if indicated and full scope of treatment in case of medical illness. The patient agreed to antibiotics if indicated when infection occurs, IV fluid and feeding tube if indicated when oral fluids and nutrition not physically feasible. Went over and signed MOST for between 3:40-4:05 pm. MOST form was signed by the patient's representative and myself.  Copies made for chart and family.      Family/ staff Communication: plan of care reviewed with the patient, the patient's HPOA, social worker, and Press photographer.   Labs/tests ordered:  none  Time spend 25 minutes.

## 2018-09-12 NOTE — Assessment & Plan Note (Signed)
No constipation, continue Colace 100mg  bid.

## 2018-09-12 NOTE — Assessment & Plan Note (Signed)
vertigo, less frequency and intensity of the events, she exercises daily, continue Meclizine 12.5mg  bid

## 2018-09-12 NOTE — Assessment & Plan Note (Signed)
Hx of anemia, Hgb 12.7 10/19,  Continue Fe 324mg  qd.

## 2018-10-02 ENCOUNTER — Encounter: Payer: Self-pay | Admitting: Nurse Practitioner

## 2018-10-02 ENCOUNTER — Non-Acute Institutional Stay: Payer: Medicare Other | Admitting: Nurse Practitioner

## 2018-10-02 DIAGNOSIS — I10 Essential (primary) hypertension: Secondary | ICD-10-CM | POA: Diagnosis not present

## 2018-10-02 DIAGNOSIS — I480 Paroxysmal atrial fibrillation: Secondary | ICD-10-CM

## 2018-10-02 DIAGNOSIS — K5909 Other constipation: Secondary | ICD-10-CM

## 2018-10-02 DIAGNOSIS — H8110 Benign paroxysmal vertigo, unspecified ear: Secondary | ICD-10-CM

## 2018-10-02 DIAGNOSIS — D509 Iron deficiency anemia, unspecified: Secondary | ICD-10-CM | POA: Diagnosis not present

## 2018-10-02 DIAGNOSIS — R413 Other amnesia: Secondary | ICD-10-CM | POA: Diagnosis not present

## 2018-10-02 NOTE — Assessment & Plan Note (Signed)
On and off, continue Meclinzine 12.5mg  bid.

## 2018-10-02 NOTE — Progress Notes (Signed)
Location:  [facility] Room Number: 822 Place of Service:  ALF [phone]) Provider:  Chipper Oman  NP  Mast, Man X, NP  Patient Care Team: Mast, Man X, NP as PCP - General (Internal Medicine) Nahser, Deloris Ping, MD as PCP - Cardiology (Cardiology) Venita Lick, MD as Consulting Physician (Orthopedic Surgery) Marykay Lex, MD as Consulting Physician (Cardiology) Mast, Man X, NP as Nurse Practitioner (Internal Medicine)  Extended Emergency Contact Information Primary Emergency Contact: [name],Kimball Address: [address]           [place], [place] [address] [place] Home Phone: [phone] Mobile Phone: [phone] Relation: Niece Secondary Emergency Contact: [name],[name]  [place] Mobile Phone: [phone] Relation: Friend  Code Status:  DNR Goals of care: Advanced Directive information Advanced Directives 10/02/2018  Does Patient Have a Medical Advance Directive? Yes  Type of Advance Directive Out of facility DNR (pink MOST or yellow form)  Does patient want to make changes to medical advance directive? No - Patient declined  Copy of Healthcare Power of Attorney in Chart? -  Would patient like information on creating a medical advance directive? -  Pre-existing out of facility DNR order (yellow form or pink MOST form) Yellow form placed in chart (order not valid for inpatient use);Pink MOST form placed in chart (order not valid for inpatient use)     Chief Complaint  Patient presents with  . Medical Management of Chronic Issues    F/u-HTN, constipation, vertigo, iron defiency anemia, unsteady gait    HPI:  Pt is a 84 y.o. female seen today for medical management of chronic diseases.     The patient has history of vertigo, last episode was yesterday 10/01/18, resolved after Meclizine x1 and 2-3 hour napping. No constipation while taking MIraLax qod, Colace 100mg  bid. Anemia, stable, on Fe daily, last Hgb 12.7 08/21/18. Her  blood pressure is stable while on Amlodipine 5mg  qd.   Past Medical History:  Diagnosis Date  . Arteriosclerotic cardiovascular disease   . Bee sting reaction 08/23/2016   Tongue swelling and difficulty swallowing /notes 08/23/2016  . Constipation   . Degenerative joint disease of right hip 10/15/14  . Fracture of multiple pubic rami (HCC) 10/15/14   Right inferior and superior pubic rami  . HOH (hard of hearing)    Wears bilateral hearing aids  . Hyperlipidemia   . Hypertension   . Macular degeneration   . Memory deficit   . Osteoporosis   . Pain in right foot   . Palpitations   . Paroxysmal atrial fibrillation (HCC)   . Thyroid nodule    Status post surgery  . Ulcer of hard palate   . Vertigo    Past Surgical History:  Procedure Laterality Date  . ABDOMINAL HYSTERECTOMY  1968  . APPENDECTOMY  1940's  . COLONOSCOPY    . FOOT SURGERY Left   . HARDWARE REMOVAL Right 02/16/2016   Procedure: HARDWARE REMOVAL RT HIP;  Surgeon: Sheral Apley, MD;  Location: [facility] OR;  Service: Orthopedics;  Laterality: Right;  . HIP ARTHROPLASTY Right 02/16/2016   Procedure: ARTHROPLASTY BIPOLAR HIP (HEMIARTHROPLASTY);  Surgeon: Sheral Apley, MD;  Location: [facility] OR;  Service: Orthopedics;  Laterality: Right;  . INTRAMEDULLARY (IM) NAIL INTERTROCHANTERIC Right 09/11/2015   Procedure: INTRAMEDULLARY (IM) NAIL INTERTROCHANTRIC;  Surgeon: Sheral Apley, MD;  Location: [facility] OR;  Service: Orthopedics;  Laterality: Right;  . KNEE SURGERY      Allergies  Allergen  Reactions  . Benadryl [Diphenhydramine] Other (See Comments)    Muscular degeneration  . Penicillins Other (See Comments)    Patient doesn't recall reaction Has patient had a PCN reaction causing immediate rash, facial/tongue/throat swelling, SOB or lightheadedness with hypotension: Unk Has patient had a PCN reaction causing severe rash involving mucus membranes or skin necrosis: Unk Has patient had a PCN reaction that required  hospitalization: Unk Has patient had a PCN reaction occurring within the last 10 years: No If all of the above answers are "NO", then may proceed with Cephalosporin use.     Outpatient Encounter Medications as of 10/02/2018  Medication Sig  . acetaminophen (TYLENOL) 500 MG tablet Take 500 mg by mouth every 6 (six) hours as needed (for pain, headaches, or fever).   Marland Kitchen amLODipine (NORVASC) 5 MG tablet Take 1 tablet (5 mg total) by mouth daily.  Marland Kitchen aspirin 325 MG tablet Take 325 mg by mouth daily.  Marland Kitchen atorvastatin (LIPITOR) 10 MG tablet Take 10 mg by mouth daily.  . Biotin 16109 MCG TABS Take 10,000 mcg by mouth daily.   . calcium carbonate (OSCAL) 1500 (600 Ca) MG TABS tablet Take 600 mg of elemental calcium by mouth 2 (two) times daily with a meal.  . Cholecalciferol (VITAMIN D3) 2000 units TABS Take 2,000 Units by mouth daily.  Marland Kitchen docusate sodium (COLACE) 100 MG capsule Take 100 mg by mouth 2 (two) times daily.   Marland Kitchen EPINEPHrine 0.3 mg/0.3 mL IJ SOAJ injection Inject 0.3 mLs (0.3 mg total) into the muscle once as needed (anaphylaxis/allergic reaction).  . ferrous gluconate (FERGON) 324 MG tablet Take 324 mg by mouth daily.  . meclizine (ANTIVERT) 12.5 MG tablet Take 12.5 mg by mouth every 12 (twelve) hours.   . Melatonin 3 MG TABS Take 3 mg by mouth at bedtime as needed.   . Multiple Vitamins-Minerals (PRESERVISION AREDS 2 PO) Take 2 capsules by mouth daily.   . Multiple Vitamins-Minerals (THEREMS-M) TABS Take 1 tablet by mouth daily.  . polyethylene glycol (MIRALAX / GLYCOLAX) packet Take 17 g by mouth every other day. HOLD FOR LOOSE STOOLS  . [DISCONTINUED] ferrous sulfate 324 (65 Fe) MG TBEC Take 324 mg by mouth daily.   No facility-administered encounter medications on file as of 10/02/2018.     Review of Systems  Constitutional: Negative for activity change, appetite change, chills, diaphoresis, fatigue, fever and unexpected weight change.  HENT: Positive for hearing loss. Negative for  voice change.        Hearing aids, the right  works better than the left  Respiratory: Negative for cough, shortness of breath and wheezing.   Cardiovascular: Negative for chest pain, palpitations and leg swelling.  Gastrointestinal: Negative for abdominal distention, abdominal pain, constipation, diarrhea, nausea and vomiting.  Genitourinary: Positive for frequency. Negative for difficulty urinating, dysuria and urgency.  Musculoskeletal: Positive for arthralgias and gait problem.  Skin: Positive for color change. Negative for pallor.       Chronic brownish pigmented skin change R+L shins.   Neurological: Positive for dizziness. Negative for speech difficulty, weakness, numbness and headaches.       Memory lapses.   Psychiatric/Behavioral: Negative for agitation, behavioral problems, hallucinations and sleep disturbance. The patient is not nervous/anxious.     Immunization History  Administered Date(s) Administered  . Influenza Whole 08/25/2018  . Influenza-Unspecified 09/07/2016, 09/12/2017  . PPD Test 02/18/2016  . Pneumococcal Conjugate-13 09/18/2017  . Tdap 10/07/2012   Pertinent  Health Maintenance Due  Topic Date  Due  . PNA vac Low Risk Adult (2 of 2 - PPSV23) 09/18/2018  . INFLUENZA VACCINE  Completed  . DEXA SCAN  Completed   Fall Risk  08/17/2018 08/03/2017 10/24/2014  Falls in the past year? No No Yes  Number falls in past yr: - - 1  Injury with Fall? - - Yes  Risk Factor Category  - - High Fall Risk  Risk for fall due to : - - History of fall(s)   Functional Status Survey:    Vitals:   10/02/18 1034  BP: 120/70  Pulse: 71  Resp: 20  Temp: (!) 97.1 F (36.2 C)  SpO2: 96%  Weight: 125 lb 3.2 oz (56.8 kg)  Height: 5\' 1"  (1.549 m)   Body mass index is 23.66 kg/m. Physical Exam  Constitutional: She appears well-developed and well-nourished.  HENT:  Head: Normocephalic and atraumatic.  Eyes: Pupils are equal, round, and reactive to light. EOM are normal.    Neck: Normal range of motion. Neck supple. No JVD present. No thyromegaly present.  Cardiovascular: Normal rate and regular rhythm.  No murmur heard. Pulmonary/Chest: Effort normal. She has no wheezes. She has no rales.  Abdominal: Soft. She exhibits no distension. There is no tenderness. There is no rebound and no guarding.  Musculoskeletal: Normal range of motion. She exhibits no edema.  Ambulates with cane.   Neurological: She is alert. No cranial nerve deficit. She exhibits normal muscle tone. Coordination normal.  Oriented to person and place.   Skin: Skin is warm and dry. No rash noted. No pallor.  Brownish pigmented R+L shins.   Psychiatric: She has a normal mood and affect. Her behavior is normal. Thought content normal.    Labs reviewed: Recent Labs    01/13/18 2022  03/14/18 1533 03/14/18 1725 03/15/18 0315 08/21/18  NA 132*   < > 127*  --  132* 133*  K 3.3*   < > 4.0  --  3.8 4.2  CL 99*  --  95*  --  100* 101  CO2 23  --  23  --  22 26  GLUCOSE 130*  --  95  --  96  --   BUN 14   < > 12  --  11 14  CREATININE 0.67   < > 0.64  --  0.63 0.7  CALCIUM 9.6  --  9.7  --  9.1 9.6  MG  --   --   --  2.0 1.9  --    < > = values in this interval not displayed.   Recent Labs    03/14/18 1533 03/15/18 0315 08/21/18  AST 30 26 24   ALT 20 18 15   ALKPHOS 51 48 51  BILITOT 0.8 0.8  --   PROT 7.3 6.5 6.7  ALBUMIN 4.2 3.8 4.2   Recent Labs    03/14/18 1533 03/14/18 1831 03/15/18 0315 08/21/18  WBC 7.1 7.4 6.9 5.3  NEUTROABS 4.9  --   --   --   HGB 13.4 13.3 12.2 12.7  HCT 37.0 37.7 35.1* 37  MCV 89.4 89.1 88.4  --   PLT 289 301 279 297   Lab Results  Component Value Date   TSH 1.876 03/14/2018   Lab Results  Component Value Date   HGBA1C 5.4 03/14/2018   Lab Results  Component Value Date   CHOL 179 03/14/2018   HDL 70 03/14/2018   LDLCALC 95 03/14/2018   TRIG 69 03/14/2018   CHOLHDL  2.6 03/14/2018    Significant Diagnostic Results in last 30 days:   No results found.  Assessment/Plan Anemia, iron deficiency Hgb has been wnl, last Hgb 12.7 08/21/18, will dc Fe, repeat CBC in one month.   Memory deficit Continue [facility] for safety and care assistance  Essential hypertension Blood pressure is in control, continue Amlodipine 5mg  qd.   Chronic constipation Stable, continue Colace bid, MiraLax qod  PAF (paroxysmal atrial fibrillation) (HCC) Heart rate is in control.   Benign paroxysmal positional vertigo On and off, continue Meclinzine 12.5mg  bid.      Family/ staff Communication: plan of care reviewed with the patient and charge nurse.   Labs/tests ordered:  CBC in one month  Time spend 25 minutes

## 2018-10-02 NOTE — Assessment & Plan Note (Signed)
Blood pressure is in control, continue Amlodipine 5mg qd.  

## 2018-10-02 NOTE — Assessment & Plan Note (Signed)
Heart rate is in control.  

## 2018-10-02 NOTE — Assessment & Plan Note (Signed)
Hgb has been wnl, last Hgb 12.7 08/21/18, will dc Fe, repeat CBC in one month.

## 2018-10-02 NOTE — Assessment & Plan Note (Signed)
Continue [facility] for safety and care assistance. 

## 2018-10-02 NOTE — Assessment & Plan Note (Signed)
Stable, continue Colace bid, MiraLax qod

## 2018-10-31 ENCOUNTER — Encounter: Payer: Self-pay | Admitting: Nurse Practitioner

## 2018-10-31 ENCOUNTER — Non-Acute Institutional Stay: Payer: Medicare Other | Admitting: Nurse Practitioner

## 2018-10-31 DIAGNOSIS — J00 Acute nasopharyngitis [common cold]: Secondary | ICD-10-CM | POA: Diagnosis not present

## 2018-10-31 DIAGNOSIS — R5381 Other malaise: Secondary | ICD-10-CM | POA: Diagnosis not present

## 2018-10-31 DIAGNOSIS — R413 Other amnesia: Secondary | ICD-10-CM

## 2018-10-31 DIAGNOSIS — J069 Acute upper respiratory infection, unspecified: Secondary | ICD-10-CM | POA: Insufficient documentation

## 2018-10-31 NOTE — Assessment & Plan Note (Signed)
Acute URI is contributory, observe, supportive measures.

## 2018-10-31 NOTE — Progress Notes (Signed)
Location:  [facility] Room Number: 822 Place of Service:  SNF (31) Provider:  Chipper OmanMast, ManXie NP  Jahden Schara X, NP  Patient Care Team: Maddeline Roorda X, NP as PCP - General (Internal Medicine) Nahser, Deloris PingPhilip J, MD as PCP - Cardiology (Cardiology) Venita LickBrooks, Dahari, MD as Consulting Physician (Orthopedic Surgery) Marykay LexHarding, David W, MD as Consulting Physician (Cardiology) Lemoine Goyne X, NP as Nurse Practitioner (Internal Medicine)  Extended Emergency Contact Information Primary Emergency Contact: [name],Krisann Address: [address]           [place], [place] [id number] [place] Home Phone: [phone] Mobile Phone: [phone] Relation: Niece Secondary Emergency Contact: [name],[name]  [place] Mobile Phone: [phone] Relation: Friend  Code Status:  DNR Goals of care: Advanced Directive information Advanced Directives 10/31/2018  Does Patient Have a Medical Advance Directive? Yes  Type of Advance Directive Out of facility DNR (pink MOST or yellow form)  Does patient want to make changes to medical advance directive? No - Patient declined  Copy of Healthcare Power of Attorney in Chart? -  Would patient like information on creating a medical advance directive? -  Pre-existing out of facility DNR order (yellow form or pink MOST form) Yellow form placed in chart (order not valid for inpatient use);Pink MOST form placed in chart (order not valid for inpatient use)     Chief Complaint  Patient presents with  . Acute Visit    C/o productive cough, chills, sore throat, malaise, headache, stuffy nose    HPI:  Pt is a 84 y.o. female seen today for an acute visit for c/o stuffy nose, headache, raspy voice,  low grade temperature, productive cough, chills, malaise x 1 day. Tylenol and Robitussin for symptoms this am. She denied chest pain, SOB, or palpitation. HPI was provided with assistance of staff. [facility]  [facility].    Past Medical History:  Diagnosis Date  . Arteriosclerotic cardiovascular disease   . Bee sting reaction 08/23/2016   Tongue swelling and difficulty swallowing /notes 08/23/2016  . Constipation   . Degenerative joint disease of right hip 10/15/14  . Fracture of multiple pubic rami (HCC) 10/15/14   Right inferior and superior pubic rami  . HOH (hard of hearing)    Wears bilateral hearing aids  . Hyperlipidemia   . Hypertension   . Macular degeneration   . Memory deficit   . Osteoporosis   . Pain in right foot   . Palpitations   . Paroxysmal atrial fibrillation (HCC)   . Thyroid nodule    Status post surgery  . Ulcer of hard palate   . Vertigo    Past Surgical History:  Procedure Laterality Date  . ABDOMINAL HYSTERECTOMY  1968  . APPENDECTOMY  1940's  . COLONOSCOPY    . FOOT SURGERY Left   . HARDWARE REMOVAL Right 02/16/2016   Procedure: HARDWARE REMOVAL RT HIP;  Surgeon: Sheral Apleyimothy D Murphy, MD;  [facility];  Service: Orthopedics;  Laterality: Right;  . HIP ARTHROPLASTY Right 02/16/2016   Procedure: ARTHROPLASTY BIPOLAR HIP (HEMIARTHROPLASTY);  Surgeon: Sheral Apleyimothy D Murphy, MD;  [facility];  Service: Orthopedics;  Laterality: Right;  . INTRAMEDULLARY (IM) NAIL INTERTROCHANTERIC Right 09/11/2015   Procedure: INTRAMEDULLARY (IM) NAIL INTERTROCHANTRIC;  Surgeon: Sheral Apleyimothy D Murphy, MD;  Location: [facility] OR;  Service: Orthopedics;  Laterality: Right;  . KNEE SURGERY      Allergies  Allergen Reactions  . Benadryl [Diphenhydramine]  Other (See Comments)    Muscular degeneration  . Penicillins Other (See Comments)    Patient doesn't recall reaction Has patient had a PCN reaction causing immediate rash, facial/tongue/throat swelling, SOB or lightheadedness with hypotension: Unk Has patient had a PCN reaction causing severe rash involving mucus membranes or skin necrosis: Unk Has patient had a PCN reaction that required hospitalization: Unk Has patient had a PCN  reaction occurring within the last 10 years: No If all of the above answers are "NO", then may proceed with Cephalosporin use.     Outpatient Encounter Medications as of 10/31/2018  Medication Sig  . acetaminophen (TYLENOL) 500 MG tablet Take 500 mg by mouth every 6 (six) hours as needed (for pain, headaches, or fever).   Marland Kitchen amLODipine (NORVASC) 5 MG tablet Take 1 tablet (5 mg total) by mouth daily.  Marland Kitchen aspirin 325 MG tablet Take 325 mg by mouth daily.  Marland Kitchen atorvastatin (LIPITOR) 10 MG tablet Take 10 mg by mouth daily.  . Biotin 16109 MCG TABS Take 10,000 mcg by mouth daily.   . calcium carbonate (OSCAL) 1500 (600 Ca) MG TABS tablet Take 600 mg of elemental calcium by mouth 2 (two) times daily with a meal.  . Cholecalciferol (VITAMIN D3) 2000 units TABS Take 2,000 Units by mouth daily.  Marland Kitchen docusate sodium (COLACE) 100 MG capsule Take 100 mg by mouth 2 (two) times daily.   Marland Kitchen EPINEPHrine 0.3 mg/0.3 mL IJ SOAJ injection Inject 0.3 mLs (0.3 mg total) into the muscle once as needed (anaphylaxis/allergic reaction).  Marland Kitchen guaiFENesin-codeine (ROBITUSSIN AC) 100-10 MG/5ML syrup Take 10 mLs by mouth every 6 (six) months.  . meclizine (ANTIVERT) 12.5 MG tablet Take 12.5 mg by mouth every 12 (twelve) hours.   . Multiple Vitamins-Minerals (PRESERVISION AREDS 2 PO) Take 2 capsules by mouth daily.   . Multiple Vitamins-Minerals (THEREMS-M) TABS Take 1 tablet by mouth daily.  . polyethylene glycol (MIRALAX / GLYCOLAX) packet Take 17 g by mouth every other day. HOLD FOR LOOSE STOOLS  . [DISCONTINUED] ferrous gluconate (FERGON) 324 MG tablet Take 324 mg by mouth daily.  . [DISCONTINUED] Melatonin 3 MG TABS Take 3 mg by mouth at bedtime as needed.    No facility-administered encounter medications on file as of 10/31/2018.    ROS was provided with assistance of staff Review of Systems  Constitutional: Positive for activity change, appetite change, fatigue and fever. Negative for chills and diaphoresis.  HENT:  Positive for congestion, hearing loss and rhinorrhea. Negative for sinus pressure, sinus pain, sneezing, sore throat, tinnitus, trouble swallowing and voice change.        Raspy voice.   Respiratory: Positive for cough. Negative for chest tightness, shortness of breath and wheezing.   Cardiovascular: Negative for chest pain, palpitations and leg swelling.  Gastrointestinal: Negative for abdominal distention, abdominal pain, constipation, diarrhea, nausea and vomiting.  Genitourinary: Positive for frequency. Negative for difficulty urinating, dysuria and urgency.       Chronic  Musculoskeletal: Positive for gait problem.  Skin: Negative for color change and pallor.  Neurological: Positive for headaches. Negative for dizziness, facial asymmetry, speech difficulty, weakness and numbness.       Memory lapses.   Psychiatric/Behavioral: Negative for agitation, behavioral problems, hallucinations and sleep disturbance. The patient is not nervous/anxious.     Immunization History  Administered Date(s) Administered  . Influenza Whole 08/25/2018  . Influenza-Unspecified 09/07/2016, 09/12/2017  . PPD Test 02/18/2016  . Pneumococcal Conjugate-13 09/18/2017  . Tdap 10/07/2012   Pertinent  Health Maintenance Due  Topic Date Due  . PNA vac Low Risk Adult (2 of 2 - PPSV23) 09/18/2018  . INFLUENZA VACCINE  Completed  . DEXA SCAN  Completed   Fall Risk  08/17/2018 08/03/2017 10/24/2014  Falls in the past year? No No Yes  Number falls in past yr: - - 1  Injury with Fall? - - Yes  Risk Factor Category  - - High Fall Risk  Risk for fall due to : - - History of fall(s)   Functional Status Survey:    Vitals:   10/31/18 1238  BP: 130/70  Pulse: 95  Resp: 20  Temp: 100 F (37.8 C)  SpO2: 96%  Weight: 128 lb (58.1 kg)  Height: 5\' 1"  (1.549 m)   Body mass index is 24.19 kg/m. Physical Exam  Constitutional: She appears well-developed and well-nourished.  HENT:  Head: Normocephalic and  atraumatic.  Nose: Nose normal.  Mouth/Throat: Oropharynx is clear and moist. No oropharyngeal exudate.  Running nose  Eyes: Pupils are equal, round, and reactive to light. EOM are normal.  Neck: Normal range of motion. Neck supple. No JVD present. No thyromegaly present.  Cardiovascular: Normal rate and regular rhythm.  No murmur heard. Pulmonary/Chest: Effort normal. She has no wheezes. She has no rales. She exhibits no tenderness.  Central congestion.  Abdominal: Soft. She exhibits no distension. There is no tenderness. There is no rebound and no guarding.  Musculoskeletal:  Ambulates with cane  Neurological: She is alert. No cranial nerve deficit. She exhibits normal muscle tone. Coordination normal.  Oriented to person and place.   Skin: Skin is warm and dry.  Chronic brownish pigmented BLE  Psychiatric: She has a normal mood and affect. Her behavior is normal.    Labs reviewed: Recent Labs    01/13/18 2022  03/14/18 1533 03/14/18 1725 03/15/18 0315 08/21/18  NA 132*   < > 127*  --  132* 133*  K 3.3*   < > 4.0  --  3.8 4.2  CL 99*  --  95*  --  100* 101  CO2 23  --  23  --  22 26  GLUCOSE 130*  --  95  --  96  --   BUN 14   < > 12  --  11 14  CREATININE 0.67   < > 0.64  --  0.63 0.7  CALCIUM 9.6  --  9.7  --  9.1 9.6  MG  --   --   --  2.0 1.9  --    < > = values in this interval not displayed.   Recent Labs    03/14/18 1533 03/15/18 0315 08/21/18  AST 30 26 24   ALT 20 18 15   ALKPHOS 51 48 51  BILITOT 0.8 0.8  --   PROT 7.3 6.5 6.7  ALBUMIN 4.2 3.8 4.2   Recent Labs    03/14/18 1533 03/14/18 1831 03/15/18 0315 08/21/18  WBC 7.1 7.4 6.9 5.3  NEUTROABS 4.9  --   --   --   HGB 13.4 13.3 12.2 12.7  HCT 37.0 37.7 35.1* 37  MCV 89.4 89.1 88.4  --   PLT 289 301 279 297   Lab Results  Component Value Date   TSH 1.876 03/14/2018   Lab Results  Component Value Date   HGBA1C 5.4 03/14/2018   Lab Results  Component Value Date   CHOL 179 03/14/2018    HDL 70 03/14/2018   LDLCALC 95  03/14/2018   TRIG 69 03/14/2018   CHOLHDL 2.6 03/14/2018    Significant Diagnostic Results in last 30 days:  No results found.  Assessment/Plan Upper respiratory infection Azithromycin 500mg  po x day1, then 250mg  po qd for day2-5. FloraStor bid x 5 days, Mucinex 600mg  bid x 3 days. Observe. May obtain CXR if no better.   Malaise Acute URI is contributory, observe, supportive measures.   Memory deficit Continue [facility] for safety and care assistance.      Family/ staff Communication: plan of care reviewed with the patient and charge nurse.   Labs/tests ordered:  none  Time spend 25 minutes.

## 2018-10-31 NOTE — Assessment & Plan Note (Signed)
Azithromycin 500mg  po x day1, then 250mg  po qd for day2-5. FloraStor bid x 5 days, Mucinex 600mg  bid x 3 days. Observe. May obtain CXR if no better.

## 2018-10-31 NOTE — Assessment & Plan Note (Signed)
Continue [facility] for safety and care assistance. 

## 2018-11-01 LAB — CBC AND DIFFERENTIAL
HCT: 36 (ref 36–46)
HEMOGLOBIN: 12.8 (ref 12.0–16.0)
Platelets: 280 (ref 150–399)
WBC: 6.1

## 2018-11-02 ENCOUNTER — Other Ambulatory Visit: Payer: Self-pay | Admitting: *Deleted

## 2019-01-21 ENCOUNTER — Emergency Department (HOSPITAL_COMMUNITY): Payer: Medicare Other

## 2019-01-21 ENCOUNTER — Encounter (HOSPITAL_COMMUNITY): Payer: Self-pay | Admitting: Emergency Medicine

## 2019-01-21 ENCOUNTER — Other Ambulatory Visit: Payer: Self-pay

## 2019-01-21 ENCOUNTER — Observation Stay (HOSPITAL_COMMUNITY)
Admission: EM | Admit: 2019-01-21 | Discharge: 2019-01-22 | Disposition: A | Discharge status: Other Acute Inpatient Hospital | Payer: Medicare Other | Attending: Internal Medicine | Admitting: Internal Medicine

## 2019-01-21 DIAGNOSIS — I2510 Atherosclerotic heart disease of native coronary artery without angina pectoris: Secondary | ICD-10-CM | POA: Diagnosis not present

## 2019-01-21 DIAGNOSIS — E871 Hypo-osmolality and hyponatremia: Secondary | ICD-10-CM | POA: Diagnosis not present

## 2019-01-21 DIAGNOSIS — Z87891 Personal history of nicotine dependence: Secondary | ICD-10-CM | POA: Insufficient documentation

## 2019-01-21 DIAGNOSIS — E876 Hypokalemia: Secondary | ICD-10-CM | POA: Insufficient documentation

## 2019-01-21 DIAGNOSIS — R112 Nausea with vomiting, unspecified: Secondary | ICD-10-CM | POA: Insufficient documentation

## 2019-01-21 DIAGNOSIS — R911 Solitary pulmonary nodule: Secondary | ICD-10-CM | POA: Diagnosis not present

## 2019-01-21 DIAGNOSIS — E860 Dehydration: Secondary | ICD-10-CM

## 2019-01-21 DIAGNOSIS — M810 Age-related osteoporosis without current pathological fracture: Secondary | ICD-10-CM | POA: Diagnosis not present

## 2019-01-21 DIAGNOSIS — X58XXXA Exposure to other specified factors, initial encounter: Secondary | ICD-10-CM | POA: Insufficient documentation

## 2019-01-21 DIAGNOSIS — K5730 Diverticulosis of large intestine without perforation or abscess without bleeding: Secondary | ICD-10-CM | POA: Insufficient documentation

## 2019-01-21 DIAGNOSIS — Z79899 Other long term (current) drug therapy: Secondary | ICD-10-CM | POA: Insufficient documentation

## 2019-01-21 DIAGNOSIS — T5491XA Toxic effect of unspecified corrosive substance, accidental (unintentional), initial encounter: Secondary | ICD-10-CM | POA: Diagnosis not present

## 2019-01-21 DIAGNOSIS — K8020 Calculus of gallbladder without cholecystitis without obstruction: Secondary | ICD-10-CM | POA: Diagnosis not present

## 2019-01-21 DIAGNOSIS — R197 Diarrhea, unspecified: Secondary | ICD-10-CM | POA: Diagnosis not present

## 2019-01-21 DIAGNOSIS — I10 Essential (primary) hypertension: Secondary | ICD-10-CM | POA: Insufficient documentation

## 2019-01-21 DIAGNOSIS — E785 Hyperlipidemia, unspecified: Secondary | ICD-10-CM | POA: Diagnosis not present

## 2019-01-21 DIAGNOSIS — Z8249 Family history of ischemic heart disease and other diseases of the circulatory system: Secondary | ICD-10-CM | POA: Diagnosis not present

## 2019-01-21 DIAGNOSIS — H3530 Unspecified macular degeneration: Secondary | ICD-10-CM | POA: Insufficient documentation

## 2019-01-21 DIAGNOSIS — E782 Mixed hyperlipidemia: Secondary | ICD-10-CM

## 2019-01-21 DIAGNOSIS — N281 Cyst of kidney, acquired: Secondary | ICD-10-CM | POA: Insufficient documentation

## 2019-01-21 DIAGNOSIS — J849 Interstitial pulmonary disease, unspecified: Secondary | ICD-10-CM | POA: Insufficient documentation

## 2019-01-21 DIAGNOSIS — I480 Paroxysmal atrial fibrillation: Secondary | ICD-10-CM | POA: Insufficient documentation

## 2019-01-21 DIAGNOSIS — K7689 Other specified diseases of liver: Secondary | ICD-10-CM | POA: Diagnosis not present

## 2019-01-21 LAB — CBC WITH DIFFERENTIAL/PLATELET
Abs Immature Granulocytes: 0.03 10*3/uL (ref 0.00–0.07)
BASOS ABS: 0 10*3/uL (ref 0.0–0.1)
Basophils Relative: 0 %
Eosinophils Absolute: 0.1 10*3/uL (ref 0.0–0.5)
Eosinophils Relative: 1 %
HCT: 37.4 % (ref 36.0–46.0)
Hemoglobin: 12.9 g/dL (ref 12.0–15.0)
Immature Granulocytes: 0 %
LYMPHS PCT: 15 %
Lymphs Abs: 1.1 10*3/uL (ref 0.7–4.0)
MCH: 31.9 pg (ref 26.0–34.0)
MCHC: 34.5 g/dL (ref 30.0–36.0)
MCV: 92.3 fL (ref 80.0–100.0)
Monocytes Absolute: 0.6 10*3/uL (ref 0.1–1.0)
Monocytes Relative: 9 %
NRBC: 0 % (ref 0.0–0.2)
Neutro Abs: 5.4 10*3/uL (ref 1.7–7.7)
Neutrophils Relative %: 75 %
Platelets: 275 10*3/uL (ref 150–400)
RBC: 4.05 MIL/uL (ref 3.87–5.11)
RDW: 13.3 % (ref 11.5–15.5)
WBC: 7.3 10*3/uL (ref 4.0–10.5)

## 2019-01-21 LAB — COMPREHENSIVE METABOLIC PANEL
ALK PHOS: 42 U/L (ref 38–126)
ALT: 22 U/L (ref 0–44)
ANION GAP: 10 (ref 5–15)
AST: 33 U/L (ref 15–41)
Albumin: 4.5 g/dL (ref 3.5–5.0)
BUN: 17 mg/dL (ref 8–23)
CALCIUM: 9.7 mg/dL (ref 8.9–10.3)
CO2: 21 mmol/L — ABNORMAL LOW (ref 22–32)
Chloride: 99 mmol/L (ref 98–111)
Creatinine, Ser: 0.72 mg/dL (ref 0.44–1.00)
GFR calc Af Amer: >60 mL/min (ref >60)
GFR calc non Af Amer: >60 mL/min (ref >60)
Glucose, Bld: 134 mg/dL — ABNORMAL HIGH (ref 70–99)
Potassium: 3.7 mmol/L (ref 3.5–5.1)
SODIUM: 130 mmol/L — AB (ref 135–145)
Total Bilirubin: 0.8 mg/dL (ref 0.3–1.2)
Total Protein: 7.3 g/dL (ref 6.5–8.1)

## 2019-01-21 LAB — SALICYLATE LEVEL: Salicylate Lvl: <7 mg/dL (ref 2.8–30.0)

## 2019-01-21 LAB — LIPASE, BLOOD: Lipase: 32 U/L (ref 11–51)

## 2019-01-21 LAB — ETHANOL: Alcohol, Ethyl (B): <10 mg/dL (ref <10)

## 2019-01-21 LAB — ACETAMINOPHEN LEVEL: Acetaminophen (Tylenol), Serum: <10 ug/mL — ABNORMAL LOW (ref 10–30)

## 2019-01-21 MED ORDER — ALUM & MAG HYDROXIDE-SIMETH 200-200-20 MG/5ML PO SUSP
30.0000 mL | Freq: Once | ORAL | Status: AC
  Administered 2019-01-21: 30 mL via ORAL
  Filled 2019-01-21: qty 30

## 2019-01-21 MED ORDER — SODIUM CHLORIDE 0.9 % IV BOLUS
500.0000 mL | Freq: Once | INTRAVENOUS | Status: AC
  Administered 2019-01-21: 500 mL via INTRAVENOUS

## 2019-01-21 MED ORDER — ONDANSETRON HCL 4 MG/2ML IJ SOLN
4.0000 mg | Freq: Once | INTRAMUSCULAR | Status: AC
  Administered 2019-01-21: 4 mg via INTRAVENOUS
  Filled 2019-01-21: qty 2

## 2019-01-21 MED ORDER — LOPERAMIDE HCL 2 MG PO CAPS
4.0000 mg | ORAL_CAPSULE | Freq: Once | ORAL | Status: AC
  Administered 2019-01-21: 4 mg via ORAL
  Filled 2019-01-21: qty 2

## 2019-01-21 MED ORDER — IOHEXOL 300 MG/ML  SOLN
75.0000 mL | Freq: Once | INTRAMUSCULAR | Status: AC | PRN
  Administered 2019-01-21: 75 mL via INTRAVENOUS

## 2019-01-21 MED ORDER — SODIUM CHLORIDE 0.9 % IV BOLUS
1000.0000 mL | Freq: Once | INTRAVENOUS | Status: AC
  Administered 2019-01-22: 1000 mL via INTRAVENOUS

## 2019-01-21 MED ORDER — LIDOCAINE VISCOUS HCL 2 % MT SOLN
15.0000 mL | Freq: Once | OROMUCOSAL | Status: AC
  Administered 2019-01-21: 15 mL via ORAL
  Filled 2019-01-21: qty 15

## 2019-01-21 NOTE — ED Provider Notes (Signed)
[facility]-EMERGENCY DEPT Provider Note   CSN: [id number] Arrival date & time: 01/21/19  1711    History   Chief Complaint Chief Complaint  Patient presents with  . Ingestion    HPI Candice Willis is a 85 y.o. female history of hypertension, hyperlipidemia, dementia, here for presenting with possible tide pod ingestion.  Patient states that she did not realize she was eating antibiotic and thought it was candy.  So she ingested 1 pod and then subsequently vomited.  She then had some epigastric pain as well.  Patient states that she felt better after she vomited.  Denies any chest pain or shortness of breath.  Denies any other ingestions.     The history is provided by the patient and the EMS personnel.  Level V caveat- dementia   Past Medical History:  Diagnosis Date  . Arteriosclerotic cardiovascular disease   . Bee sting reaction 08/23/2016   Tongue swelling and difficulty swallowing /notes 08/23/2016  . Constipation   . Degenerative joint disease of right hip 10/15/14  . Fracture of multiple pubic rami (HCC) 10/15/14   Right inferior and superior pubic rami  . HOH (hard of hearing)    Wears bilateral hearing aids  . Hyperlipidemia   . Hypertension   . Macular degeneration   . Memory deficit   . Osteoporosis   . Pain in right foot   . Palpitations   . Paroxysmal atrial fibrillation (HCC)   . Thyroid nodule    Status post surgery  . Ulcer of hard palate   . Vertigo     Patient Active Problem List   Diagnosis Date Noted  . Upper respiratory infection 10/31/2018  . Malaise 10/31/2018  . Advanced care planning/counseling discussion 05/09/2018  . Incontinent of urine 02/26/2018  . Urinary frequency 02/19/2018  . Benign paroxysmal positional vertigo 01/02/2018  . Osteoarthritis of right hip 01/02/2018  . Gait abnormality 12/21/2016  . Anemia, iron deficiency 12/21/2016  . Macular degeneration of both eyes 09/08/2016  . Sensorineural hearing  loss (SNHL) of both ears 09/08/2016  . PVC's (premature ventricular contractions) 01/05/2016  . Hyponatremia 09/13/2015  . PAF (paroxysmal atrial fibrillation) (HCC) 09/11/2015  . Vitamin D deficiency 10/24/2014  . Memory deficit   . Thyroid nodule   . Hyperlipidemia 10/20/2014  . Essential hypertension 10/20/2014  . Osteoporosis without current pathological fracture 10/20/2014  . Chronic constipation 10/20/2014    Past Surgical History:  Procedure Laterality Date  . ABDOMINAL HYSTERECTOMY  1968  . APPENDECTOMY  1940's  . COLONOSCOPY    . FOOT SURGERY Left   . HARDWARE REMOVAL Right 02/16/2016   Procedure: HARDWARE REMOVAL RT HIP;  Surgeon: Sheral Apleyimothy D Murphy, MD;  [facility];  Service: Orthopedics;  Laterality: Right;  . HIP ARTHROPLASTY Right 02/16/2016   Procedure: ARTHROPLASTY BIPOLAR HIP (HEMIARTHROPLASTY);  Surgeon: Sheral Apleyimothy D Murphy, MD;  [facility];  Service: Orthopedics;  Laterality: Right;  . INTRAMEDULLARY (IM) NAIL INTERTROCHANTERIC Right 09/11/2015   Procedure: INTRAMEDULLARY (IM) NAIL INTERTROCHANTRIC;  Surgeon: Sheral Apleyimothy D Murphy, MD;  Location: [facility] OR;  Service: Orthopedics;  Laterality: Right;  . KNEE SURGERY       OB History   No obstetric history on file.      Home Medications    Prior to Admission medications   Medication Sig Start Date End Date Taking? Authorizing Provider  acetaminophen (TYLENOL) 500 MG tablet Take 500 mg by mouth every 6 (six) hours as needed.   Yes [provider]  amLODipine (NORVASC) 5 MG tablet Take 1 tablet (5 mg total) by mouth daily. 03/15/18  Yes Amin, Ankit Chirag, MD  atorvastatin (LIPITOR) 10 MG tablet Take 10 mg by mouth daily.   Yes [provider]  Biotin 97353 MCG TABS Take 10,000 mcg by mouth daily.    Yes [provider]  calcium carbonate (OSCAL) 1500 (600 Ca) MG TABS tablet Take 600 mg of elemental calcium by mouth 2 (two) times daily with a meal.   Yes [provider]    Cholecalciferol (VITAMIN D3) 2000 units TABS Take 2,000 Units by mouth daily.   Yes [provider]  docusate sodium (COLACE) 100 MG capsule Take 100 mg by mouth 2 (two) times daily.    Yes [provider]  meclizine (ANTIVERT) 12.5 MG tablet Take 12.5 mg by mouth every 12 (twelve) hours.    Yes [provider]  Melatonin 3 MG TABS Take 3 mg by mouth at bedtime as needed (sleep).   Yes [provider]  Multiple Vitamins-Minerals (PRESERVISION AREDS 2 PO) Take 2 capsules by mouth daily.    Yes [provider]  Multiple Vitamins-Minerals (THEREMS-M) TABS Take 1 tablet by mouth daily.   Yes [provider]  polyethylene glycol (MIRALAX / GLYCOLAX) packet Take 17 g by mouth every other day. HOLD FOR LOOSE STOOLS   Yes [provider]  EPINEPHrine 0.3 mg/0.3 mL IJ SOAJ injection Inject 0.3 mLs (0.3 mg total) into the muscle once as needed (anaphylaxis/allergic reaction). 08/24/16   Lovena Neighbours, MD    Family History Family History  Problem Relation Age of Onset  . Heart disease Father   . Cancer Father        prostate  . Cancer Maternal Grandmother        colon  . Heart disease Paternal Grandmother   . Hyperlipidemia Sister   . Cancer Sister        breast  . Heart disease Brother   . Hyperlipidemia Brother   . Cancer Brother        prostate    Social History Social History   Tobacco Use  . Smoking status: Former Smoker    Packs/day: 0.50    Types: Cigarettes    Last attempt to quit: 10/05/1971    Years since quitting: 47.3  . Smokeless tobacco: Never Used  Substance Use Topics  . Alcohol use: Yes    Comment: 1-4 per week; eats 10 raisins daily soaked in Gin  . Drug use: No     Allergies   Patient has no active allergies.   Review of Systems Review of Systems  Gastrointestinal: Positive for vomiting.  All other systems reviewed and are negative.    Physical Exam Updated Vital Signs BP (!) 143/76  (BP Location: Right Arm)   Pulse 71   Temp 97.8 F (36.6 C) (Oral)   Resp 16   SpO2 95%   Physical Exam Vitals signs and nursing note reviewed.  HENT:     Head: Normocephalic.     Right Ear: Tympanic membrane normal.     Left Ear: Tympanic membrane normal.     Nose: Nose normal.     Mouth/Throat:     Comments: No obvious foreign body in posterior pharynx, no obvious erythema  Eyes:     Extraocular Movements: Extraocular movements intact.     Pupils: Pupils are equal, round, and reactive to light.  Neck:     Musculoskeletal: Normal range  of motion.  Cardiovascular:     Rate and Rhythm: Normal rate.     Pulses: Normal pulses.  Pulmonary:     Effort: Pulmonary effort is normal.     Breath sounds: Normal breath sounds.  Abdominal:     General: Abdomen is flat.     Palpations: Abdomen is soft.  Musculoskeletal: Normal range of motion.  Skin:    General: Skin is warm.     Capillary Refill: Capillary refill takes less than 2 seconds.  Neurological:     General: No focal deficit present.     Mental Status: She is alert and oriented to person, place, and time.  Psychiatric:        Mood and Affect: Mood normal.        Behavior: Behavior normal.      ED Treatments / Results  Labs (all labs ordered are listed, but only abnormal results are displayed) Labs Reviewed  COMPREHENSIVE METABOLIC PANEL - Abnormal; Notable for the following components:      Result Value   Sodium 130 (*)    CO2 21 (*)    Glucose, Bld 134 (*)    All other components within normal limits  ACETAMINOPHEN LEVEL - Abnormal; Notable for the following components:   Acetaminophen (Tylenol), Serum <10 (*)    All other components within normal limits  C DIFFICILE QUICK SCREEN W PCR REFLEX  GASTROINTESTINAL PANEL BY PCR, STOOL (REPLACES STOOL CULTURE)  CBC WITH DIFFERENTIAL/PLATELET  ETHANOL  SALICYLATE LEVEL  LIPASE, BLOOD    EKG EKG Interpretation  Date/Time:  Monday January 21 2019 17:41:16  EST Ventricular Rate:  67 PR Interval:    QRS Duration: 88 QT Interval:  418 QTC Calculation: 442 R Axis:   59 Text Interpretation:  Sinus rhythm Prolonged PR interval No significant change since last tracing Confirmed by Richardean Canal [phone]) on 01/21/2019 5:57:40 PM   Radiology Ct Abdomen Pelvis W Contrast  Result Date: 01/21/2019 CLINICAL DATA:  Nausea, vomiting, diarrhea and abdominal pain after ingesting a laundry deterrent pod earlier today. The symptoms have subsequently resolved. EXAM: CT ABDOMEN AND PELVIS WITH CONTRAST TECHNIQUE: Multidetector CT imaging of the abdomen and pelvis was performed using the standard protocol following bolus administration of intravenous contrast. CONTRAST:  81mL OMNIPAQUE IOHEXOL 300 MG/ML  SOLN COMPARISON:  None. FINDINGS: Lower chest: Minimal bibasilar atelectasis/scarring. 4 mm average diameter nodule in the left lower lobe laterally on image number 3 series 4. Small calcified granuloma in the right lower lobe. Mildly enlarged heart, primarily due to left atrial enlargement. Hepatobiliary: Multiple small calcified gallstones in the gallbladder measuring up to 4 mm in maximum diameter each. No gallbladder wall thickening or pericholecystic fluid. Small cysts in the liver. Pancreas: Moderate diffuse pancreatic atrophy. Spleen: Normal in size without focal abnormality. Adrenals/Urinary Tract: Normal appearing adrenal glands. Small right renal cyst. Multiple areas of cortical scarring involving the left kidney. Obscuration of portions of the urinary bladder and distal ureters by streak artifacts produced by a right hip prosthesis. The visualized portions are unremarkable. Stomach/Bowel: Normal caliber colon filled with fluid and a small amount of gas. Normal caliber fluid-filled loops of small bowel. Mild diffuse wall thickening and enhancement involving this small bowel loops and the colon. Similar changes involving the stomach. No ingested foreign bodies are seen.  Multiple sigmoid and descending colon diverticula as well as diverticula involving the splenic flexure and distal transverse colon. Surgically absent appendix. Vascular/Lymphatic: Atheromatous arterial calcifications without aneurysm. No enlarged lymph nodes.  Reproductive: Status post hysterectomy. No adnexal masses. Other: No abdominal wall hernia or abnormality. No abdominopelvic ascites. Musculoskeletal: Right hip prosthesis with associated streak artifacts. Old, healed right pubic bone fractures with partial bridging fusion of the symphysis pubis. Lumbar and lower thoracic spine degenerative changes. IMPRESSION: 1. Mild diffuse mucosal thickening and enhancement involving the stomach, small bowel and colon, predominantly filled with fluid. This is compatible with gastroenteritis. 2. Colonic diverticulosis. 3. Cholelithiasis. 4. 4 mm average diameter left lower lobe nodule. No follow-up needed if patient is low-risk. Non-contrast chest CT can be considered in 12 months if patient is high-risk. This recommendation follows the consensus statement: Guidelines for Management of Incidental Pulmonary Nodules Detected on CT Images: From the Fleischner Society 2017; Radiology 2017; 284:228-243. Electronically Signed   By: Beckie Salts M.D.   On: 01/21/2019 21:52   Dg Chest Port 1 View  Result Date: 01/21/2019 CLINICAL DATA:  Nausea, vomiting, diarrhea and lethargy after ingesting a pod of laundry detergent today. The symptoms are currently resolved. EXAM: PORTABLE CHEST 1 VIEW COMPARISON:  03/14/2018. FINDINGS: Poor inspiration. No gross change in a normal sized heart. The lungs are clear with stable mild prominence of the interstitial markings. Diffuse osteopenia. IMPRESSION: No acute abnormality. Stable mild chronic interstitial lung disease. Electronically Signed   By: Beckie Salts M.D.   On: 01/21/2019 17:55    Procedures Procedures (including critical care time)  Medications Ordered in ED Medications    sodium chloride 0.9 % bolus 1,000 mL (has no administration in time range)  sodium chloride 0.9 % bolus 500 mL (500 mLs Intravenous New Bag/Given (Non-Interop) 01/21/19 1752)  ondansetron (ZOFRAN) injection 4 mg (4 mg Intravenous Given 01/21/19 1753)  alum & mag hydroxide-simeth (MAALOX/MYLANTA) 200-200-20 MG/5ML suspension 30 mL (30 mLs Oral Given 01/21/19 1753)    And  lidocaine (XYLOCAINE) 2 % viscous mouth solution 15 mL (15 mLs Oral Given 01/21/19 1754)  loperamide (IMODIUM) capsule 4 mg (4 mg Oral Given 01/21/19 2119)  iohexol (OMNIPAQUE) 300 MG/ML solution 75 mL (75 mLs Intravenous Contrast Given 01/21/19 2128)     Initial Impression / Assessment and Plan / ED Course  I have reviewed the triage vital signs and the nursing notes.  Pertinent labs & imaging results that were available during my care of the patient were reviewed by me and considered in my medical decision making (see chart for details).       ALEEHA BOLINE is a 85 y.o. female here with possible tide pod ingestion. Will get labs, CXR, obtain serum tox levels. Will hydrate and PO trial.   7 pm Patient tolerated PO in the ED. I talked to poison control and reviewed labs with them. They states that case is closed and patient can be discharged.   8 pm Patient had watery diarrhea about 3 times over the last 2 hours. She states that she had loose stools even before tide pod ingestion. Will get CT ab/pel to assess.   11:01 PM Patient's CT showed enteritis. Sodium 130 likely from diarrhea. Had another 2-3 episodes of diarrhea. Ordered GI pathogen, C diff. Will admit for hydration and observation. Will hold antibiotics for now.   Final Clinical Impressions(s) / ED Diagnoses   Final diagnoses:  None    ED Discharge Orders    None       Charlynne Pander, MD 01/21/19 2302

## 2019-01-21 NOTE — H&P (Signed)
History and Physical    Candice Willis:[id number] DOB: [birth year] DOA: 01/21/2019  Referring MD/NP/PA:   PCP: Mast, Man X, NP   Patient coming from:  The patient is coming from SNF[facility]. At baseline, pt is partially dependent for most of ADL.        Chief Complaint: ingestion  HPI: Candice Willis is a 85 y.o. female with medical history significant of hypertension, hyperlipidemia, HOH, PAF not on anticoagulants, vertigo, who presents with ingestion.  Pt was reportedly to have accidentally ingested tide pod, then subsequently vomited. She state that she "thought it was candy".  Per EDP, pt had nausea, vomiting and some epigastric abdominal pain earlier, which have resolved.  She denies any abdominal pain to me currently. She states that she has severe diarrhea, but cannot characterize diarrhea in detail, but per EDP, pt has had at least 3 watery diarrhea in ED. No fever or chills.  Patient does not have chest pain, cough, shortness breath.  Denies symptoms of UTI or unilateral weakness.  Of note, patient is taking Colace and MiraLAX at home. Pt states that she had loose stools even before tide pod ingestion.  ED Course: pt was found to have WBC 7.3, lipase 32, Tylenol level less than 10, salicylate level less than 7, sodium 130, renal function normal, temperature normal, no tachycardia, no tachypnea, oxygen saturation 95% on room air.  Chest x-ray negative. CT-abdomen/pelvis showed a possible gastroenteritis.  Pt is placed on MedSurg bed for observation.  # CT abdomen/pelvis showed: 1. Mild diffuse mucosal thickening and enhancement involving the stomach, small bowel and colon, predominantly filled with fluid. This is compatible with gastroenteritis. 2. Colonic diverticulosis. 3. Cholelithiasis. 4. 4 mm average diameter left lower lobe nodule.  Review of Systems:   General: no fevers, chills, no body weight gain, has fatigue HEENT: no blurry vision, hearing changes or sore  throat Respiratory: no dyspnea, coughing, wheezing CV: no chest pain, no palpitations GI: has nausea, vomiting, abdominal pain, diarrhea, no constipation GU: no dysuria, burning on urination, increased urinary frequency, hematuria  Ext: no leg edema Neuro: no unilateral weakness, numbness, or tingling, no vision change or hearing loss Skin: no rash, no skin tear. MSK: No muscle spasm, no deformity, no limitation of range of movement in spin Heme: No easy bruising.  Travel history: No recent long distant travel.  Allergy: No Active Allergies  Past Medical History:  Diagnosis Date  . Arteriosclerotic cardiovascular disease   . Bee sting reaction 08/23/2016   Tongue swelling and difficulty swallowing /notes 08/23/2016  . Constipation   . Degenerative joint disease of right hip 10/15/14  . Fracture of multiple pubic rami (HCC) 10/15/14   Right inferior and superior pubic rami  . HOH (hard of hearing)    Wears bilateral hearing aids  . Hyperlipidemia   . Hypertension   . Macular degeneration   . Memory deficit   . Osteoporosis   . Pain in right foot   . Palpitations   . Paroxysmal atrial fibrillation (HCC)   . Thyroid nodule    Status post surgery  . Ulcer of hard palate   . Vertigo     Past Surgical History:  Procedure Laterality Date  . ABDOMINAL HYSTERECTOMY  1968  . APPENDECTOMY  1940's  . COLONOSCOPY    . FOOT SURGERY Left   . HARDWARE REMOVAL Right 02/16/2016   Procedure: HARDWARE REMOVAL RT HIP;  Surgeon: Sheral Apley, MD;  Location: [facility] OR;  Service:  Orthopedics;  Laterality: Right;  . HIP ARTHROPLASTY Right 02/16/2016   Procedure: ARTHROPLASTY BIPOLAR HIP (HEMIARTHROPLASTY);  Surgeon: Sheral Apley, MD;  Location: [facility] OR;  Service: Orthopedics;  Laterality: Right;  . INTRAMEDULLARY (IM) NAIL INTERTROCHANTERIC Right 09/11/2015   Procedure: INTRAMEDULLARY (IM) NAIL INTERTROCHANTRIC;  Surgeon: Sheral Apley, MD;  Location: [facility] OR;  Service: Orthopedics;   Laterality: Right;  . KNEE SURGERY      Social History:  reports that she quit smoking about 47 years ago. Her smoking use included cigarettes. She smoked 0.50 packs per day. She has never used smokeless tobacco. She reports current alcohol use. She reports that she does not use drugs.  Family History:  Family History  Problem Relation Age of Onset  . Heart disease Father   . Cancer Father        prostate  . Cancer Maternal Grandmother        colon  . Heart disease Paternal Grandmother   . Hyperlipidemia Sister   . Cancer Sister        breast  . Heart disease Brother   . Hyperlipidemia Brother   . Cancer Brother        prostate     Prior to Admission medications   Medication Sig Start Date End Date Taking? Authorizing Provider  acetaminophen (TYLENOL) 500 MG tablet Take 500 mg by mouth every 6 (six) hours as needed.   Yes [provider]  amLODipine (NORVASC) 5 MG tablet Take 1 tablet (5 mg total) by mouth daily. 03/15/18  Yes Amin, Ankit Chirag, MD  atorvastatin (LIPITOR) 10 MG tablet Take 10 mg by mouth daily.   Yes [provider]  Biotin 16109 MCG TABS Take 10,000 mcg by mouth daily.    Yes [provider]  calcium carbonate (OSCAL) 1500 (600 Ca) MG TABS tablet Take 600 mg of elemental calcium by mouth 2 (two) times daily with a meal.   Yes [provider]  Cholecalciferol (VITAMIN D3) 2000 units TABS Take 2,000 Units by mouth daily.   Yes [provider]  docusate sodium (COLACE) 100 MG capsule Take 100 mg by mouth 2 (two) times daily.    Yes [provider]  meclizine (ANTIVERT) 12.5 MG tablet Take 12.5 mg by mouth every 12 (twelve) hours.    Yes [provider]  Melatonin 3 MG TABS Take 3 mg by mouth at bedtime as needed (sleep).   Yes [provider]  Multiple Vitamins-Minerals (PRESERVISION AREDS 2 PO) Take 2 capsules by mouth daily.    Yes [provider]  Multiple Vitamins-Minerals  (THEREMS-M) TABS Take 1 tablet by mouth daily.   Yes [provider]  polyethylene glycol (MIRALAX / GLYCOLAX) packet Take 17 g by mouth every other day. HOLD FOR LOOSE STOOLS   Yes [provider]  EPINEPHrine 0.3 mg/0.3 mL IJ SOAJ injection Inject 0.3 mLs (0.3 mg total) into the muscle once as needed (anaphylaxis/allergic reaction). 08/24/16   Lovena Neighbours, MD    Physical Exam: Vitals:   01/21/19 2000 01/22/19 0130 01/22/19 0200 01/22/19 0447  BP: (!) 143/76 139/68 (!) 129/96 (!) 151/68  Pulse: 71 72 82 69  Resp: Temp:    98.4 F (36.9 C)  TempSrc:    Oral  SpO2: 95% 94% 94% 97%   General: Not in acute distress HEENT:       Eyes: PERRL, EOMI, no scleral icterus.       ENT: No discharge  from the ears and nose, no pharynx injection, no tonsillar enlargement.        Neck: No JVD, no bruit, no mass felt. Heme: No neck lymph node enlargement. Cardiac: S1/S2, RRR, No murmurs, No gallops or rubs. Respiratory: No rales, wheezing, rhonchi or rubs. GI: Soft, nondistended, nontender, no rebound pain, no organomegaly, BS present. GU: No hematuria Ext: No pitting leg edema bilaterally. 2+DP/PT pulse bilaterally. Musculoskeletal: No joint deformities, No joint redness or warmth, no limitation of ROM in spin. Skin: No rashes.  Neuro: Alert, oriented X3, cranial nerves II-XII grossly intact, moves all extremities normally.   Psych: Patient is not psychotic, no suicidal or hemocidal ideation.  Labs on Admission: I have personally reviewed following labs and imaging studies  CBC: Recent Labs  Lab 01/21/19 1727  WBC 7.3  NEUTROABS 5.4  HGB 12.9  HCT 37.4  MCV 92.3  PLT 275   Basic Metabolic Panel: Recent Labs  Lab 01/21/19 1727  NA 130*  K 3.7  CL 99  CO2 21*  GLUCOSE 134*  BUN 17  CREATININE 0.72  CALCIUM 9.7   GFR: CrCl cannot be calculated (Unknown ideal weight.). Liver Function Tests: Recent Labs  Lab 01/21/19 1727  AST 33  ALT  22  ALKPHOS 42  BILITOT 0.8  PROT 7.3  ALBUMIN 4.5   Recent Labs  Lab 01/21/19 1727  LIPASE 32   No results for input(s): AMMONIA in the last 168 hours. Coagulation Profile: No results for input(s): INR, PROTIME in the last 168 hours. Cardiac Enzymes: No results for input(s): CKTOTAL, CKMB, CKMBINDEX, TROPONINI in the last 168 hours. BNP (last 3 results) No results for input(s): PROBNP in the last 8760 hours. HbA1C: No results for input(s): HGBA1C in the last 72 hours. CBG: No results for input(s): GLUCAP in the last 168 hours. Lipid Profile: No results for input(s): CHOL, HDL, LDLCALC, TRIG, CHOLHDL, LDLDIRECT in the last 72 hours. Thyroid Function Tests: No results for input(s): TSH, T4TOTAL, FREET4, T3FREE, THYROIDAB in the last 72 hours. Anemia Panel: No results for input(s): VITAMINB12, FOLATE, FERRITIN, TIBC, IRON, RETICCTPCT in the last 72 hours. Urine analysis:    Component Value Date/Time   COLORURINE STRAW (A) 01/13/2018 2022   APPEARANCEUR CLEAR 01/13/2018 2022   LABSPEC 1.004 (L) 01/13/2018 2022   PHURINE 7.0 01/13/2018 2022   GLUCOSEU NEGATIVE 01/13/2018 2022   HGBUR NEGATIVE 01/13/2018 2022   BILIRUBINUR NEGATIVE 01/13/2018 2022   KETONESUR NEGATIVE 01/13/2018 2022   PROTEINUR NEGATIVE 01/13/2018 2022   NITRITE POSITIVE (A) 01/13/2018 2022   LEUKOCYTESUR TRACE (A) 01/13/2018 2022   Sepsis Labs: (procalcitonin:4,lacticidven:4) )No results found for this or any previous visit (from the past 240 hour(s)).   Radiological Exams on Admission: Ct Abdomen Pelvis W Contrast  Result Date: 01/21/2019 CLINICAL DATA:  Nausea, vomiting, diarrhea and abdominal pain after ingesting a laundry deterrent pod earlier today. The symptoms have subsequently resolved. EXAM: CT ABDOMEN AND PELVIS WITH CONTRAST TECHNIQUE: Multidetector CT imaging of the abdomen and pelvis was performed using the standard protocol following bolus administration of intravenous contrast.  CONTRAST:  75mL OMNIPAQUE IOHEXOL 300 MG/ML  SOLN COMPARISON:  None. FINDINGS: Lower chest: Minimal bibasilar atelectasis/scarring. 4 mm average diameter nodule in the left lower lobe laterally on image number 3 series 4. Small calcified granuloma in the right lower lobe. Mildly enlarged heart, primarily due to left atrial enlargement. Hepatobiliary: Multiple small calcified gallstones in the gallbladder measuring up to 4 mm in maximum diameter each. No gallbladder wall  thickening or pericholecystic fluid. Small cysts in the liver. Pancreas: Moderate diffuse pancreatic atrophy. Spleen: Normal in size without focal abnormality. Adrenals/Urinary Tract: Normal appearing adrenal glands. Small right renal cyst. Multiple areas of cortical scarring involving the left kidney. Obscuration of portions of the urinary bladder and distal ureters by streak artifacts produced by a right hip prosthesis. The visualized portions are unremarkable. Stomach/Bowel: Normal caliber colon filled with fluid and a small amount of gas. Normal caliber fluid-filled loops of small bowel. Mild diffuse wall thickening and enhancement involving this small bowel loops and the colon. Similar changes involving the stomach. No ingested foreign bodies are seen. Multiple sigmoid and descending colon diverticula as well as diverticula involving the splenic flexure and distal transverse colon. Surgically absent appendix. Vascular/Lymphatic: Atheromatous arterial calcifications without aneurysm. No enlarged lymph nodes. Reproductive: Status post hysterectomy. No adnexal masses. Other: No abdominal wall hernia or abnormality. No abdominopelvic ascites. Musculoskeletal: Right hip prosthesis with associated streak artifacts. Old, healed right pubic bone fractures with partial bridging fusion of the symphysis pubis. Lumbar and lower thoracic spine degenerative changes. IMPRESSION: 1. Mild diffuse mucosal thickening and enhancement involving the stomach, small  bowel and colon, predominantly filled with fluid. This is compatible with gastroenteritis. 2. Colonic diverticulosis. 3. Cholelithiasis. 4. 4 mm average diameter left lower lobe nodule. No follow-up needed if patient is low-risk. Non-contrast chest CT can be considered in 12 months if patient is high-risk. This recommendation follows the consensus statement: Guidelines for Management of Incidental Pulmonary Nodules Detected on CT Images: From the Fleischner Society 2017; Radiology 2017; 284:228-243. Electronically Signed   By: Beckie Salts M.D.   On: 01/21/2019 21:52   Dg Chest Port 1 View  Result Date: 01/21/2019 CLINICAL DATA:  Nausea, vomiting, diarrhea and lethargy after ingesting a pod of laundry detergent today. The symptoms are currently resolved. EXAM: PORTABLE CHEST 1 VIEW COMPARISON:  03/14/2018. FINDINGS: Poor inspiration. No gross change in a normal sized heart. The lungs are clear with stable mild prominence of the interstitial markings. Diffuse osteopenia. IMPRESSION: No acute abnormality. Stable mild chronic interstitial lung disease. Electronically Signed   By: Beckie Salts M.D.   On: 01/21/2019 17:55     EKG: Independently reviewed.  Sinus rhythm, QTC 442, low voltage, nonspecific T wave change.   Assessment/Plan Principal Problem:   Ingestion of corrosive chemical Active Problems:   Hyperlipidemia   Essential hypertension   PAF (paroxysmal atrial fibrillation) (HCC)   Hyponatremia   Diarrhea   Lung nodule   Ingestion of corrosive chemical (tid pod) and diarrhea: EDP talked to poison control, they recommended to make sure patient can tolerate oral before discharge. Pt has diarrhea.  CT abdomen/pelvis that showed possible gastroenteritis.  Not sure if this is caused by ingestion of tid pod or due to possible viral gastroenteritis.  Patient is taking Colace and MiraLAX, does not meet criteria for checking C. difficile colitis. Lipase normal.  -will place on med-surg bed for  obs -f/u GI pathogen panel -Hold Colace and MiraLAX -IV fluid: 1.5 L normal saline, then 75 cc/h - start IV pepcid 20 mg bid  Hyperlipidemia: -Lipitor  HTN:  -Continue home medications: Amlodipine -IV hydralazine prn  PAF (paroxysmal atrial fibrillation) (HCC): CHA2DS2-VASc Score is 4, needs oral anticoagulation, but pt is not on at home, not sure why pt is not taking AC. Pt is not on node blockers at home. Heart rate is well controlled. Hr 71. -observe closely  Hyponatremia: mild. Na 130, likely due to diarreha -  IVF as above  Lung nodule: Incidental findings by CT scan, 4 mm of left lower lobe nodule. -f/u with PCP   DVT ppx: SQ Lovenox Code Status: Full code Family Communication: None at bed side.     Disposition Plan:  Anticipate discharge back to previous SNF-[facility] Consults called:  none Admission status:  medical floor/obs     Date of Service 01/22/2019    Lorretta Harp [facility]   If 7PM-7AM, please contact night-coverage [email or web address] Password [facility] 01/22/2019, 5:48 AM

## 2019-01-21 NOTE — ED Triage Notes (Signed)
Pt from [facility] with accidental ingestion of tide pod; "thought it was candy;" n/v, abd pain, and lethargy; symptoms resolved at present.

## 2019-01-21 NOTE — ED Notes (Addendum)
Patient was able to drink 8 oz of water w/o any difficulties. Patient stated throat felt sore.

## 2019-01-22 ENCOUNTER — Other Ambulatory Visit: Payer: Self-pay

## 2019-01-22 ENCOUNTER — Encounter (HOSPITAL_COMMUNITY): Payer: Self-pay

## 2019-01-22 DIAGNOSIS — T5491XA Toxic effect of unspecified corrosive substance, accidental (unintentional), initial encounter: Secondary | ICD-10-CM | POA: Diagnosis not present

## 2019-01-22 DIAGNOSIS — K591 Functional diarrhea: Secondary | ICD-10-CM

## 2019-01-22 DIAGNOSIS — R911 Solitary pulmonary nodule: Secondary | ICD-10-CM | POA: Diagnosis present

## 2019-01-22 DIAGNOSIS — E860 Dehydration: Secondary | ICD-10-CM | POA: Diagnosis not present

## 2019-01-22 LAB — GASTROINTESTINAL PANEL BY PCR, STOOL (REPLACES STOOL CULTURE)

## 2019-01-22 LAB — BASIC METABOLIC PANEL
Anion gap: 8 (ref 5–15)
BUN: 15 mg/dL (ref 8–23)
CO2: 20 mmol/L — ABNORMAL LOW (ref 22–32)
Calcium: 8.7 mg/dL — ABNORMAL LOW (ref 8.9–10.3)
Chloride: 105 mmol/L (ref 98–111)
Creatinine, Ser: 0.58 mg/dL (ref 0.44–1.00)
GFR calc Af Amer: >60 mL/min (ref >60)
GFR calc non Af Amer: >60 mL/min (ref >60)
Glucose, Bld: 97 mg/dL (ref 70–99)
Potassium: 3.1 mmol/L — ABNORMAL LOW (ref 3.5–5.1)
Sodium: 133 mmol/L — ABNORMAL LOW (ref 135–145)

## 2019-01-22 LAB — CBC
HCT: 36.9 % (ref 36.0–46.0)
Hemoglobin: 12.5 g/dL (ref 12.0–15.0)
MCH: 31.3 pg (ref 26.0–34.0)
MCHC: 33.9 g/dL (ref 30.0–36.0)
MCV: 92.3 fL (ref 80.0–100.0)
Platelets: 260 10*3/uL (ref 150–400)
RBC: 4 MIL/uL (ref 3.87–5.11)
RDW: 13.5 % (ref 11.5–15.5)
WBC: 9.2 10*3/uL (ref 4.0–10.5)
nRBC: 0 % (ref 0.0–0.2)

## 2019-01-22 LAB — MAGNESIUM: Magnesium: 1.9 mg/dL (ref 1.7–2.4)

## 2019-01-22 MED ORDER — MECLIZINE HCL 25 MG PO TABS
12.5000 mg | ORAL_TABLET | Freq: Two times a day (BID) | ORAL | Status: DC
  Administered 2019-01-22: 12.5 mg via ORAL
  Filled 2019-01-22: qty 1

## 2019-01-22 MED ORDER — FAMOTIDINE IN NACL 20-0.9 MG/50ML-% IV SOLN
20.0000 mg | Freq: Two times a day (BID) | INTRAVENOUS | Status: DC
  Administered 2019-01-22 (×2): 20 mg via INTRAVENOUS
  Filled 2019-01-22 (×2): qty 50

## 2019-01-22 MED ORDER — CALCIUM CARBONATE 1250 (500 CA) MG PO TABS
500.0000 mg | ORAL_TABLET | Freq: Two times a day (BID) | ORAL | Status: DC
  Administered 2019-01-22: 500 mg via ORAL
  Filled 2019-01-22: qty 1

## 2019-01-22 MED ORDER — POTASSIUM CHLORIDE IN NACL 40-0.9 MEQ/L-% IV SOLN
INTRAVENOUS | Status: DC
  Administered 2019-01-22: 75 mL/h via INTRAVENOUS
  Filled 2019-01-22: qty 1000

## 2019-01-22 MED ORDER — BIOTIN 10000 MCG PO TABS: 10000.0000 ug | ORAL_TABLET | Freq: Every day | ORAL | Status: DC

## 2019-01-22 MED ORDER — ACETAMINOPHEN 500 MG PO TABS: 500.0000 mg | ORAL_TABLET | Freq: Four times a day (QID) | ORAL | Status: DC | PRN

## 2019-01-22 MED ORDER — FAMOTIDINE 20 MG PO TABS: 20.0000 mg | ORAL_TABLET | Freq: Two times a day (BID) | ORAL | Status: DC

## 2019-01-22 MED ORDER — HYDRALAZINE HCL 20 MG/ML IJ SOLN: 5.0000 mg | INTRAMUSCULAR | Status: DC | PRN

## 2019-01-22 MED ORDER — AMLODIPINE BESYLATE 5 MG PO TABS
5.0000 mg | ORAL_TABLET | Freq: Every day | ORAL | Status: DC
  Administered 2019-01-22: 5 mg via ORAL
  Filled 2019-01-22: qty 1

## 2019-01-22 MED ORDER — POTASSIUM CHLORIDE CRYS ER 20 MEQ PO TBCR
40.0000 meq | EXTENDED_RELEASE_TABLET | Freq: Once | ORAL | Status: AC
  Administered 2019-01-22: 40 meq via ORAL
  Filled 2019-01-22: qty 2

## 2019-01-22 MED ORDER — ATORVASTATIN CALCIUM 10 MG PO TABS: 10.0000 mg | ORAL_TABLET | Freq: Every day | ORAL | Status: DC

## 2019-01-22 MED ORDER — SODIUM CHLORIDE 0.9 % IV SOLN
INTRAVENOUS | Status: DC
  Administered 2019-01-22: 02:00:00 via INTRAVENOUS

## 2019-01-22 MED ORDER — ENOXAPARIN SODIUM 40 MG/0.4ML ~~LOC~~ SOLN
40.0000 mg | Freq: Every day | SUBCUTANEOUS | Status: DC
  Administered 2019-01-22: 40 mg via SUBCUTANEOUS
  Filled 2019-01-22: qty 0.4

## 2019-01-22 MED ORDER — VITAMIN D 25 MCG (1000 UNIT) PO TABS
2000.0000 [IU] | ORAL_TABLET | Freq: Every day | ORAL | Status: DC
  Administered 2019-01-22: 2000 [IU] via ORAL
  Filled 2019-01-22: qty 2

## 2019-01-22 MED ORDER — EPINEPHRINE 0.3 MG/0.3ML IJ SOAJ
0.3000 mg | Freq: Once | INTRAMUSCULAR | Status: DC | PRN
  Filled 2019-01-22: qty 0.3

## 2019-01-22 MED ORDER — ONDANSETRON HCL 4 MG PO TABS: 4.0000 mg | ORAL_TABLET | Freq: Four times a day (QID) | ORAL | Status: DC | PRN

## 2019-01-22 MED ORDER — PROSIGHT PO TABS
ORAL_TABLET | Freq: Every day | ORAL | Status: DC
  Administered 2019-01-22: 1 via ORAL
  Filled 2019-01-22: qty 1

## 2019-01-22 MED ORDER — ONDANSETRON HCL 4 MG/2ML IJ SOLN: 4.0000 mg | Freq: Four times a day (QID) | INTRAMUSCULAR | Status: DC | PRN

## 2019-01-22 MED ORDER — POTASSIUM CHLORIDE ER 10 MEQ PO TBCR: 20.0000 meq | EXTENDED_RELEASE_TABLET | Freq: Every day | ORAL | 0 refills | Status: DC

## 2019-01-22 MED ORDER — MELATONIN 3 MG PO TABS
3.0000 mg | ORAL_TABLET | Freq: Every evening | ORAL | Status: DC | PRN
  Filled 2019-01-22: qty 1

## 2019-01-22 NOTE — Progress Notes (Signed)
Report given to [facility] RN at [facility] [phone].

## 2019-01-22 NOTE — Progress Notes (Signed)
PHARMACIST - PHYSICIAN ORDER COMMUNICATION  CONCERNING: P&T Medication Policy on Herbal Medications  DESCRIPTION:  This patient's order for:  biotin  has been noted.  This product(s) is classified as an "herbal" or natural product. Due to a lack of definitive safety studies or FDA approval, nonstandard manufacturing practices, plus the potential risk of unknown drug-drug interactions while on inpatient medications, the Pharmacy and Therapeutics Committee does not permit the use of "herbal" or natural products of this type within [facility].   ACTION TAKEN: The pharmacy department is unable to verify this order at this time and your patient has been informed of this safety policy. Please reevaluate patient's clinical condition at discharge and address if the herbal or natural product(s) should be resumed at that time.   Thanks Lorenza Evangelist  01/22/2019 4:35 AM

## 2019-01-22 NOTE — Progress Notes (Signed)
Clinical Social Worker facilitated patient discharge including contacting patient family and facility to confirm patient discharge plans.  Clinical information faxed to facility and family agreeable with plan.  CSW arranged ambulance transport via [facility] to [facility] .  RN to call [phone] ext [phone] for report prior to discharge.  Clinical Social Worker will sign off for now as social work intervention is no longer needed. Please consult [place] again if new need arises.  Marrianne Mood, MSW, [employer] [phone]

## 2019-01-22 NOTE — Progress Notes (Signed)
Key Points: Use following P&T approved IV to PO non-antibiotic change policy.  Description contains the criteria that are approved Note: Policy Excludes:  Esophagectomy patientsPHARMACIST - PHYSICIAN COMMUNICATION DR:   Susie Cassette CONCERNING: IV to Oral Route Change Policy  RECOMMENDATION: This patient is receiving pepcid by the intravenous route.  Based on criteria approved by the Pharmacy and Therapeutics Committee, the intravenous medication(s) is/are being converted to the equivalent oral dose form(s).   DESCRIPTION: These criteria include:  The patient is eating (either orally or via tube) and/or has been taking other orally administered medications for a least 24 hours  The patient has no evidence of active gastrointestinal bleeding or impaired GI absorption (gastrectomy, short bowel, patient on TNA or NPO).  If you have questions about this conversion, please contact the Pharmacy Department  []   [phone] )  [facility] []   [phone] )  [facility]  []   [phone] )  [facility] [x]   [phone] )  [facility]  Earl Many Benton, [facility] 01/22/2019 10:22 AM

## 2019-01-22 NOTE — Clinical Social Work Placement (Signed)
   CLINICAL SOCIAL WORK PLACEMENT  NOTE  Date:  01/22/2019  Patient Details  Name: Candice Willis MRN: [id number] Date of Birth: [birth year]  Clinical Social Work is seeking post-discharge placement for this patient at the Assisted Living Facility level of care (*CSW will initial, date and re-position this form in  chart as items are completed):  Yes   Patient/family provided with [facility] Clinical Social Work Department's list of facilities offering this level of care within the geographic area requested by the patient (or if unable, by the patient's family).  Yes   Patient/family informed of their freedom to choose among providers that offer the needed level of care, that participate in Medicare, Medicaid or managed care program needed by the patient, have an available bed and are willing to accept the patient.  Yes   Patient/family informed of [facility]'s ownership interest in [facility] and [facility], as well as of the fact that they are under no obligation to receive care at these facilities.  PASRR submitted to EDS on       PASRR number received on       Existing PASRR number confirmed on 01/22/19     FL2 transmitted to all facilities in geographic area requested by pt/family on 01/22/19     FL2 transmitted to all facilities within larger geographic area on       Patient informed that his/her managed care company has contracts with or will negotiate with certain facilities, including the following:            Patient/family informed of bed offers received.  Patient chooses bed at [facility]     Physician recommends and patient chooses bed at      Patient to be transferred to [facility] on 01/22/19.  Patient to be transferred to facility by [facility]     Patient family notified on 01/22/19 of transfer.  Name of family member notified:  spoke with patients niece "Mishawn"      PHYSICIAN       Additional Comment:     _______________________________________________ Althea Charon, LCSW 01/22/2019, 1:14 PM

## 2019-01-22 NOTE — Discharge Summary (Addendum)
Physician Discharge Summary  Candice Willis MRN: [id number] DOB/AGE: 12/06/1923 85 y.o.  PCP: Mast, Man X, NP   Admit date: 01/21/2019 Discharge date: 01/22/2019  Discharge Diagnoses:    Principal Problem:   Ingestion of corrosive chemical Active Problems:   Hyperlipidemia   Essential hypertension   PAF (paroxysmal atrial fibrillation) (HCC)   Hyponatremia   Diarrhea   Lung nodule    Follow-up recommendations Follow-up with PCP in 3-5 days , including all  additional recommended appointments as below Follow-up CBC, CMP in 3-5 days       Allergies as of 01/22/2019   No Active Allergies     Medication List    TAKE these medications   acetaminophen 500 MG tablet Commonly known as:  TYLENOL Take 500 mg by mouth every 6 (six) hours as needed.   amLODipine 5 MG tablet Commonly known as:  NORVASC Take 1 tablet (5 mg total) by mouth daily.   atorvastatin 10 MG tablet Commonly known as:  LIPITOR Take 10 mg by mouth daily.   Biotin 88325 MCG Tabs Take 10,000 mcg by mouth daily.   calcium carbonate 1500 (600 Ca) MG Tabs tablet Commonly known as:  OSCAL Take 600 mg of elemental calcium by mouth 2 (two) times daily with a meal.   docusate sodium 100 MG capsule Commonly known as:  COLACE Take 100 mg by mouth 2 (two) times daily.   EPINEPHrine 0.3 mg/0.3 mL Soaj injection Commonly known as:  EPI-PEN Inject 0.3 mLs (0.3 mg total) into the muscle once as needed (anaphylaxis/allergic reaction).   meclizine 12.5 MG tablet Commonly known as:  ANTIVERT Take 12.5 mg by mouth every 12 (twelve) hours.   Melatonin 3 MG Tabs Take 3 mg by mouth at bedtime as needed (sleep).   polyethylene glycol packet Commonly known as:  MIRALAX / GLYCOLAX Take 17 g by mouth every other day. HOLD FOR LOOSE STOOLS   potassium chloride 10 MEQ tablet Commonly known as:  K-DUR Take 2 tablets (20 mEq total) by mouth daily.   PRESERVISION AREDS 2 PO Take 2 capsules by mouth daily.    THEREMS-M Tabs Take 1 tablet by mouth daily.   Vitamin D3 50 MCG (2000 UT) Tabs Take 2,000 Units by mouth daily.        Discharge Condition:  stable  Discharge Instructions Get Medicines reviewed and adjusted: Please take all your medications with you for your next visit with your Primary MD  Please request your Primary MD to go over all hospital tests and procedure/radiological results at the follow up, please ask your Primary MD to get all Hospital records sent to his/her office.  If you experience worsening of your admission symptoms, develop shortness of breath, life threatening emergency, suicidal or homicidal thoughts you must seek medical attention immediately by calling 911 or calling your MD immediately  if symptoms less severe.  You must read complete instructions/literature along with all the possible adverse reactions/side effects for all the Medicines you take and that have been prescribed to you. Take any new Medicines after you have completely understood and accpet all the possible adverse reactions/side effects.   Do not drive when taking Pain medications.   Do not take more than prescribed Pain, Sleep and Anxiety Medications  Special Instructions: If you have smoked or chewed Tobacco  in the last 2 yrs please stop smoking, stop any regular Alcohol  and or any Recreational drug use.  Wear Seat belts while driving.  Please note  You were cared for by a hospitalist during your hospital stay. Once you are discharged, your primary care physician will handle any further medical issues. Please note that NO REFILLS for any discharge medications will be authorized once you are discharged, as it is imperative that you return to your primary care physician (or establish a relationship with a primary care physician if you do not have one) for your aftercare needs so that they can reassess your need for medications and monitor your lab values.  Discharge Instructions     Discharge patient   Complete by:  As directed    Discharge disposition:  01-Home or Self Care   Discharge patient date:  01/22/2019       No Active Allergies    Disposition: Discharge disposition: 01-Home or Self Care        Consults:  none    Significant Diagnostic Studies:  Ct Abdomen Pelvis W Contrast  Result Date: 01/21/2019 CLINICAL DATA:  Nausea, vomiting, diarrhea and abdominal pain after ingesting a laundry deterrent pod earlier today. The symptoms have subsequently resolved. EXAM: CT ABDOMEN AND PELVIS WITH CONTRAST TECHNIQUE: Multidetector CT imaging of the abdomen and pelvis was performed using the standard protocol following bolus administration of intravenous contrast. CONTRAST:  75mL OMNIPAQUE IOHEXOL 300 MG/ML  SOLN COMPARISON:  None. FINDINGS: Lower chest: Minimal bibasilar atelectasis/scarring. 4 mm average diameter nodule in the left lower lobe laterally on image number 3 series 4. Small calcified granuloma in the right lower lobe. Mildly enlarged heart, primarily due to left atrial enlargement. Hepatobiliary: Multiple small calcified gallstones in the gallbladder measuring up to 4 mm in maximum diameter each. No gallbladder wall thickening or pericholecystic fluid. Small cysts in the liver. Pancreas: Moderate diffuse pancreatic atrophy. Spleen: Normal in size without focal abnormality. Adrenals/Urinary Tract: Normal appearing adrenal glands. Small right renal cyst. Multiple areas of cortical scarring involving the left kidney. Obscuration of portions of the urinary bladder and distal ureters by streak artifacts produced by a right hip prosthesis. The visualized portions are unremarkable. Stomach/Bowel: Normal caliber colon filled with fluid and a small amount of gas. Normal caliber fluid-filled loops of small bowel. Mild diffuse wall thickening and enhancement involving this small bowel loops and the colon. Similar changes involving the stomach. No ingested foreign bodies  are seen. Multiple sigmoid and descending colon diverticula as well as diverticula involving the splenic flexure and distal transverse colon. Surgically absent appendix. Vascular/Lymphatic: Atheromatous arterial calcifications without aneurysm. No enlarged lymph nodes. Reproductive: Status post hysterectomy. No adnexal masses. Other: No abdominal wall hernia or abnormality. No abdominopelvic ascites. Musculoskeletal: Right hip prosthesis with associated streak artifacts. Old, healed right pubic bone fractures with partial bridging fusion of the symphysis pubis. Lumbar and lower thoracic spine degenerative changes. IMPRESSION: 1. Mild diffuse mucosal thickening and enhancement involving the stomach, small bowel and colon, predominantly filled with fluid. This is compatible with gastroenteritis. 2. Colonic diverticulosis. 3. Cholelithiasis. 4. 4 mm average diameter left lower lobe nodule. No follow-up needed if patient is low-risk. Non-contrast chest CT can be considered in 12 months if patient is high-risk. This recommendation follows the consensus statement: Guidelines for Management of Incidental Pulmonary Nodules Detected on CT Images: From the Fleischner Society 2017; Radiology 2017; 284:228-243. Electronically Signed   By: Beckie Salts M.D.   On: 01/21/2019 21:52   Dg Chest Port 1 View  Result Date: 01/21/2019 CLINICAL DATA:  Nausea, vomiting, diarrhea and lethargy after ingesting a pod of laundry detergent today. The symptoms are  currently resolved. EXAM: PORTABLE CHEST 1 VIEW COMPARISON:  03/14/2018. FINDINGS: Poor inspiration. No gross change in a normal sized heart. The lungs are clear with stable mild prominence of the interstitial markings. Diffuse osteopenia. IMPRESSION: No acute abnormality. Stable mild chronic interstitial lung disease. Electronically Signed   By: Beckie Salts M.D.   On: 01/21/2019 17:55        There were no vitals filed for this visit.   Microbiology: No results found for  this or any previous visit (from the past 240 hour(s)).     Blood Culture    Component Value Date/Time   SDES URINE, CLEAN CATCH 02/05/2016 1344   SPECREQUEST NONE 02/05/2016 1344   CULT MULTIPLE SPECIES PRESENT, SUGGEST RECOLLECTION 02/05/2016 1344   REPTSTATUS 02/06/2016 FINAL 02/05/2016 1344      Labs: Results for orders placed or performed during the hospital encounter of 01/21/19 (from the past 48 hour(s))  CBC with Differential/Platelet     Status: None   Collection Time: 01/21/19  5:27 PM  Result Value Ref Range   WBC 7.3 4.0 - 10.5 K/uL   RBC 4.05 3.87 - 5.11 MIL/uL   Hemoglobin 12.9 12.0 - 15.0 g/dL   HCT 16.1 09.6 - 04.5 %   MCV 92.3 80.0 - 100.0 fL   MCH 31.9 26.0 - 34.0 pg   MCHC 34.5 30.0 - 36.0 g/dL   RDW 40.9 81.1 - 91.4 %   Platelets 275 150 - 400 K/uL   nRBC 0.0 0.0 - 0.2 %   Neutrophils Relative % 75 %   Neutro Abs 5.4 1.7 - 7.7 K/uL   Lymphocytes Relative 15 %   Lymphs Abs 1.1 0.7 - 4.0 K/uL   Monocytes Relative 9 %   Monocytes Absolute 0.6 0.1 - 1.0 K/uL   Eosinophils Relative 1 %   Eosinophils Absolute 0.1 0.0 - 0.5 K/uL   Basophils Relative 0 %   Basophils Absolute 0.0 0.0 - 0.1 K/uL   Immature Granulocytes 0 %   Abs Immature Granulocytes 0.03 0.00 - 0.07 K/uL    Comment: Performed at [facility], [address], [place], [place] [address]  Comprehensive metabolic panel     Status: Abnormal   Collection Time: 01/21/19  5:27 PM  Result Value Ref Range   Sodium 130 (L) 135 - 145 mmol/L   Potassium 3.7 3.5 - 5.1 mmol/L   Chloride 99 98 - 111 mmol/L   CO2 21 (L) 22 - 32 mmol/L   Glucose, Bld 134 (H) 70 - 99 mg/dL   BUN 17 8 - 23 mg/dL   Creatinine, Ser 6.21 0.44 - 1.00 mg/dL   Calcium 9.7 8.9 - 30.8 mg/dL   Total Protein 7.3 6.5 - 8.1 g/dL   Albumin 4.5 3.5 - 5.0 g/dL   AST 33 15 - 41 U/L   ALT 22 0 - 44 U/L   Alkaline Phosphatase 42 38 - 126 U/L   Total Bilirubin 0.8 0.3 - 1.2 mg/dL   GFR calc non Af Amer >60 >60  mL/min   GFR calc Af Amer >60 >60 mL/min   Anion gap 10 5 - 15    Comment: Performed at [facility], [address], [place], [place] [address]  Ethanol     Status: None   Collection Time: 01/21/19  5:27 PM  Result Value Ref Range   Alcohol, Ethyl (B) <10 <10 mg/dL    Comment: (NOTE) Lowest detectable limit for serum alcohol is 10  mg/dL. For medical purposes only. Performed at [facility], [address], [place], [place] [address]   Salicylate level     Status: None   Collection Time: 01/21/19  5:27 PM  Result Value Ref Range   Salicylate Lvl <7.0 2.8 - 30.0 mg/dL    Comment: Performed at [facility] [address], [place], [place] [address]  Acetaminophen level     Status: Abnormal   Collection Time: 01/21/19  5:27 PM  Result Value Ref Range   Acetaminophen (Tylenol), Serum <10 (L) 10 - 30 ug/mL    Comment: (NOTE) Therapeutic concentrations vary significantly. A range of 10-30 ug/mL  may be an effective concentration for many patients. However, some  are best treated at concentrations outside of this range. Acetaminophen concentrations >150 ug/mL at 4 hours after ingestion  and >50 ug/mL at 12 hours after ingestion are often associated with  toxic reactions. Performed at [facility], [address] [place], [place] [address]   Lipase, blood     Status: None   Collection Time: 01/21/19  5:27 PM  Result Value Ref Range   Lipase 32 11 - 51 U/L    Comment: Performed at [facility], [address] [place], [place] [address]  Basic metabolic panel     Status: Abnormal   Collection Time: 01/22/19  7:03 AM  Result Value Ref Range   Sodium 133 (L) 135 - 145 mmol/L   Potassium 3.1 (L) 3.5 - 5.1 mmol/L   Chloride 105 98 - 111 mmol/L   CO2 20 (L) 22 - 32 mmol/L   Glucose, Bld 97 70 - 99 mg/dL   BUN 15 8 - 23 mg/dL   Creatinine, Ser 4.26 0.44 - 1.00 mg/dL   Calcium 8.7 (L) 8.9  - 10.3 mg/dL   GFR calc non Af Amer >60 >60 mL/min   GFR calc Af Amer >60 >60 mL/min   Anion gap 8 5 - 15    Comment: Performed at [facility], [address], [place], [place] [address]  CBC     Status: None   Collection Time: 01/22/19  7:03 AM  Result Value Ref Range   WBC 9.2 4.0 - 10.5 K/uL   RBC 4.00 3.87 - 5.11 MIL/uL   Hemoglobin 12.5 12.0 - 15.0 g/dL   HCT 62.2 29.7 - 98.9 %   MCV 92.3 80.0 - 100.0 fL   MCH 31.3 26.0 - 34.0 pg   MCHC 33.9 30.0 - 36.0 g/dL   RDW 21.1 94.1 - 74.0 %   Platelets 260 150 - 400 K/uL   nRBC 0.0 0.0 - 0.2 %    Comment: Performed at [facility], [address] [place], [place] [address]  Magnesium     Status: None   Collection Time: 01/22/19  7:03 AM  Result Value Ref Range   Magnesium 1.9 1.7 - 2.4 mg/dL    Comment: Performed at [facility], [address], [place], [place] [address]     Lipid Panel     Component Value Date/Time   CHOL 179 03/14/2018 1831   TRIG 69 03/14/2018 1831   HDL 70 03/14/2018 1831   CHOLHDL 2.6 03/14/2018 1831   VLDL 14 03/14/2018 1831   LDLCALC 95 03/14/2018 1831     Lab Results  Component Value Date   HGBA1C 5.4 03/14/2018     Lab Results  Component Value Date   LDLCALC  95 03/14/2018   CREATININE 0.58 01/22/2019     HPI :  Candice Willis is a 85 y.o. female with medical history significant of hypertension, hyperlipidemia, HOH, PAF not on anticoagulants, vertigo, who presents with ingestion.  Pt was reportedly to have accidentally ingested tide pod, then subsequently vomited. She state that she "thought it was candy".  Per EDP, pt had nausea, vomiting and some epigastric abdominal pain earlier, which have resolved.  She denies any abdominal pain to me currently. She states that she has severe diarrhea, but cannot characterize diarrhea in detail, but per EDP, pt has had at least 3 watery diarrhea in ED. No fever or chills.  Patient does not  have chest pain, cough, shortness breath.  Denies symptoms of UTI or unilateral weakness.  Of note, patient is taking Colace and MiraLAX at home. Pt states that she had loose stools even before tide pod ingestion.  ED Course: pt was found to have WBC 7.3, lipase 32, Tylenol level less than 10, salicylate level less than 7, sodium 130, renal function normal, temperature normal, no tachycardia, no tachypnea, oxygen saturation 95% on room air.  Chest x-ray negative. CT-abdomen/pelvis showed a possible gastroenteritis.  Pt is placed on MedSurg bed for observation.  # CT abdomen/pelvis showed: 1. Mild diffuse mucosal thickening and enhancement involving the stomach, small bowel and colon, predominantly filled with fluid. This is compatible with gastroenteritis. 2. Colonic diverticulosis. 3. Cholelithiasis. 4. 4 mm average diameter left lower lobe nodule.  HOSPITAL COURSE:    Ingestion of corrosive chemical (tid pod) and diarrhea probably secondary to viral gastroenteritis: EDP talked to poison control, they recommended to make sure patient can tolerate oral before discharge. patient states diarrhea has resolved..  CT abdomen/pelvis that showed possible gastroenteritis.  Not sure if this is caused by ingestion of tid pod or due to possible viral gastroenteritis.  Patient is taking Colace and MiraLAX, does not meet criteria for checking C. difficile colitis. Lipase normal. -will place on med-surg bed for obs - GI panel is pending at this time -Hold Colace and MiraLAX -symptoms have improved and the patient tolerated a mechanical soft diet prior to discharge  Hyperlipidemia: -Lipitor  HTN:  -Continue home medications: Amlodipine -IV hydralazine prn  PAF (paroxysmal atrial fibrillation) (HCC): CHA2DS2-VASc Score is 4, needs oral anticoagulation, but pt is not on at home, not sure why pt is not taking AC. Pt is not on node blockers at home. Heart rate is well controlled. Hr 71. Patient provided  with potassium supplementation , noted to have low potassium on admission  Hyponatremia: mild. Na 133 prior to discharge    Lung nodule: Incidental findings by CT scan, 4 mm of left lower lobe nodule. -f/u with PCP   Discharge Exam:   Blood pressure (!) 151/68, pulse 69, temperature 98.4 F (36.9 C), temperature source Oral, resp. rate 16, SpO2 97 %.  Cardiac: S1/S2, RRR, No murmurs, No gallops or rubs. Respiratory: No rales, wheezing, rhonchi or rubs. GI: Soft, nondistended, nontender, no rebound pain, no organomegaly, BS present. GU: No hematuria Ext: No pitting leg edema bilaterally. 2+DP/PT pulse bilaterally. Musculoskeletal: No joint deformities, No joint redness or warmth, no limitation of ROM in spin. Skin: No rashes.  Neuro: Alert, oriented X3, cranial nerves II-XII grossly intact, moves all extremities normally    Follow-up Information    Mast, Man X, NP. Call.   Specialty:  Internal Medicine Why:  Hospital follow-up in 3-5 days Contact information: [address]  [place] [address] [phone]        Nahser, Deloris Ping, MD .   Specialty:  Cardiology Contact information: [address] [place] [address] [phone]           Signed: Richarda Overlie 01/22/2019, 12:39 PM      Time needed to  prepare  discharge, discussed with the patient and family 35 minutes

## 2019-01-22 NOTE — ED Notes (Signed)
ED TO INPATIENT HANDOFF REPORT  ED Nurse Name and Phone #: Leilani Able, RN [phone]   Name/Age/Gender Candice Willis 85 y.o. female Room/Bed: WA19/WA19  Code Status   Code Status: Full Code  Home/SNF/Other Home Patient oriented to: self, place, time and situation Is this baseline? Yes   Triage Complete: Triage complete  Chief Complaint Ingested a Tide Pod  Triage Note Pt from [facility] with accidental ingestion of tide pod; "thought it was candy;" n/v, abd pain, and lethargy; symptoms resolved at present.   Allergies No Active Allergies  Level of Care/Admitting Diagnosis ED Disposition    ED Disposition Condition Comment   Admit  Hospital Area: [facility] [[id number]]  Level of Care: Med-Surg [16]  Diagnosis: Diarrhea [787.91.ICD-9-CM]  Admitting Physician: Lorretta Harp [[id number]]  Attending Physician: Lorretta Harp [[id number]]  PT Class (Do Not Modify): Observation [104]  PT Acc Code (Do Not Modify): Observation [10022]       B Medical/Surgery History Past Medical History:  Diagnosis Date  . Arteriosclerotic cardiovascular disease   . Bee sting reaction 08/23/2016   Tongue swelling and difficulty swallowing /notes 08/23/2016  . Constipation   . Degenerative joint disease of right hip 10/15/14  . Fracture of multiple pubic rami (HCC) 10/15/14   Right inferior and superior pubic rami  . HOH (hard of hearing)    Wears bilateral hearing aids  . Hyperlipidemia   . Hypertension   . Macular degeneration   . Memory deficit   . Osteoporosis   . Pain in right foot   . Palpitations   . Paroxysmal atrial fibrillation (HCC)   . Thyroid nodule    Status post surgery  . Ulcer of hard palate   . Vertigo    Past Surgical History:  Procedure Laterality Date  . ABDOMINAL HYSTERECTOMY  1968  . APPENDECTOMY  1940's  . COLONOSCOPY    . FOOT SURGERY Left   . HARDWARE REMOVAL Right 02/16/2016   Procedure: HARDWARE REMOVAL RT HIP;  Surgeon: Sheral Apley, MD;  Location: [facility] OR;  Service: Orthopedics;  Laterality: Right;  . HIP ARTHROPLASTY Right 02/16/2016   Procedure: ARTHROPLASTY BIPOLAR HIP (HEMIARTHROPLASTY);  Surgeon: Sheral Apley, MD;  Location: [facility] OR;  Service: Orthopedics;  Laterality: Right;  . INTRAMEDULLARY (IM) NAIL INTERTROCHANTERIC Right 09/11/2015   Procedure: INTRAMEDULLARY (IM) NAIL INTERTROCHANTRIC;  Surgeon: Sheral Apley, MD;  Location: [facility] OR;  Service: Orthopedics;  Laterality: Right;  . KNEE SURGERY       A IV Location/Drains/Wounds Patient Lines/Drains/Airways Status   Active Line/Drains/Airways    Name:   Placement date:   Placement time:   Site:   Days:   Peripheral IV 01/21/19 Left Antecubital   01/21/19    1720    Antecubital   1   Incision (Closed) 09/11/15 Leg Right   09/11/15    0732     1229   Incision (Closed) 02/16/16 Hip Right   02/16/16    0922     1071   Incision (Closed) 02/16/16 Thigh Right   02/16/16    0923     1071          Intake/Output Last 24 hours No intake or output data in the 24 hours ending 01/22/19 0259  Labs/Imaging Results for orders placed or performed during the hospital encounter of 01/21/19 (from the past 48 hour(s))  CBC with Differential/Platelet     Status: None   Collection Time: 01/21/19  5:27 PM  Result Value Ref Range   WBC 7.3 4.0 - 10.5 K/uL   RBC 4.05 3.87 - 5.11 MIL/uL   Hemoglobin 12.9 12.0 - 15.0 g/dL   HCT 51.8 84.1 - 66.0 %   MCV 92.3 80.0 - 100.0 fL   MCH 31.9 26.0 - 34.0 pg   MCHC 34.5 30.0 - 36.0 g/dL   RDW 63.0 16.0 - 10.9 %   Platelets 275 150 - 400 K/uL   nRBC 0.0 0.0 - 0.2 %   Neutrophils Relative % 75 %   Neutro Abs 5.4 1.7 - 7.7 K/uL   Lymphocytes Relative 15 %   Lymphs Abs 1.1 0.7 - 4.0 K/uL   Monocytes Relative 9 %   Monocytes Absolute 0.6 0.1 - 1.0 K/uL   Eosinophils Relative 1 %   Eosinophils Absolute 0.1 0.0 - 0.5 K/uL   Basophils Relative 0 %   Basophils Absolute 0.0 0.0 - 0.1 K/uL   Immature Granulocytes 0 %   Abs  Immature Granulocytes 0.03 0.00 - 0.07 K/uL    Comment: Performed at [facility], [address], [place], [place] [address]  Comprehensive metabolic panel     Status: Abnormal   Collection Time: 01/21/19  5:27 PM  Result Value Ref Range   Sodium 130 (L) 135 - 145 mmol/L   Potassium 3.7 3.5 - 5.1 mmol/L   Chloride 99 98 - 111 mmol/L   CO2 21 (L) 22 - 32 mmol/L   Glucose, Bld 134 (H) 70 - 99 mg/dL   BUN 17 8 - 23 mg/dL   Creatinine, Ser 7.32 0.44 - 1.00 mg/dL   Calcium 9.7 8.9 - 20.2 mg/dL   Total Protein 7.3 6.5 - 8.1 g/dL   Albumin 4.5 3.5 - 5.0 g/dL   AST 33 15 - 41 U/L   ALT 22 0 - 44 U/L   Alkaline Phosphatase 42 38 - 126 U/L   Total Bilirubin 0.8 0.3 - 1.2 mg/dL   GFR calc non Af Amer >60 >60 mL/min   GFR calc Af Amer >60 >60 mL/min   Anion gap 10 5 - 15    Comment: Performed at [facility], [address], [place], [place] [address]  Ethanol     Status: None   Collection Time: 01/21/19  5:27 PM  Result Value Ref Range   Alcohol, Ethyl (B) <10 <10 mg/dL    Comment: (NOTE) Lowest detectable limit for serum alcohol is 10 mg/dL. For medical purposes only. Performed at [facility], [address], [place], [place] [address]   Salicylate level     Status: None   Collection Time: 01/21/19  5:27 PM  Result Value Ref Range   Salicylate Lvl <7.0 2.8 - 30.0 mg/dL    Comment: Performed at [facility], [address] [place], [place] [address]  Acetaminophen level     Status: Abnormal   Collection Time: 01/21/19  5:27 PM  Result Value Ref Range   Acetaminophen (Tylenol), Serum <10 (L) 10 - 30 ug/mL    Comment: (NOTE) Therapeutic concentrations vary significantly. A range of 10-30 ug/mL  may be an effective concentration for many patients. However, some  are best treated at concentrations outside of this range. Acetaminophen concentrations >150 ug/mL at 4 hours after ingestion  and >50 ug/mL at 12  hours after ingestion are often associated with  toxic reactions. Performed at [facility], [address], [place], [place] [address]   Lipase,  blood     Status: None   Collection Time: 01/21/19  5:27 PM  Result Value Ref Range   Lipase 32 11 - 51 U/L    Comment: Performed at [facility], [address], [place], [place] [address]   Ct Abdomen Pelvis W Contrast  Result Date: 01/21/2019 CLINICAL DATA:  Nausea, vomiting, diarrhea and abdominal pain after ingesting a laundry deterrent pod earlier today. The symptoms have subsequently resolved. EXAM: CT ABDOMEN AND PELVIS WITH CONTRAST TECHNIQUE: Multidetector CT imaging of the abdomen and pelvis was performed using the standard protocol following bolus administration of intravenous contrast. CONTRAST:  75mL OMNIPAQUE IOHEXOL 300 MG/ML  SOLN COMPARISON:  None. FINDINGS: Lower chest: Minimal bibasilar atelectasis/scarring. 4 mm average diameter nodule in the left lower lobe laterally on image number 3 series 4. Small calcified granuloma in the right lower lobe. Mildly enlarged heart, primarily due to left atrial enlargement. Hepatobiliary: Multiple small calcified gallstones in the gallbladder measuring up to 4 mm in maximum diameter each. No gallbladder wall thickening or pericholecystic fluid. Small cysts in the liver. Pancreas: Moderate diffuse pancreatic atrophy. Spleen: Normal in size without focal abnormality. Adrenals/Urinary Tract: Normal appearing adrenal glands. Small right renal cyst. Multiple areas of cortical scarring involving the left kidney. Obscuration of portions of the urinary bladder and distal ureters by streak artifacts produced by a right hip prosthesis. The visualized portions are unremarkable. Stomach/Bowel: Normal caliber colon filled with fluid and a small amount of gas. Normal caliber fluid-filled loops of small bowel. Mild diffuse wall thickening and enhancement involving this small bowel  loops and the colon. Similar changes involving the stomach. No ingested foreign bodies are seen. Multiple sigmoid and descending colon diverticula as well as diverticula involving the splenic flexure and distal transverse colon. Surgically absent appendix. Vascular/Lymphatic: Atheromatous arterial calcifications without aneurysm. No enlarged lymph nodes. Reproductive: Status post hysterectomy. No adnexal masses. Other: No abdominal wall hernia or abnormality. No abdominopelvic ascites. Musculoskeletal: Right hip prosthesis with associated streak artifacts. Old, healed right pubic bone fractures with partial bridging fusion of the symphysis pubis. Lumbar and lower thoracic spine degenerative changes. IMPRESSION: 1. Mild diffuse mucosal thickening and enhancement involving the stomach, small bowel and colon, predominantly filled with fluid. This is compatible with gastroenteritis. 2. Colonic diverticulosis. 3. Cholelithiasis. 4. 4 mm average diameter left lower lobe nodule. No follow-up needed if patient is low-risk. Non-contrast chest CT can be considered in 12 months if patient is high-risk. This recommendation follows the consensus statement: Guidelines for Management of Incidental Pulmonary Nodules Detected on CT Images: From the Fleischner Society 2017; Radiology 2017; 284:228-243. Electronically Signed   By: Beckie Salts M.D.   On: 01/21/2019 21:52   Dg Chest Port 1 View  Result Date: 01/21/2019 CLINICAL DATA:  Nausea, vomiting, diarrhea and lethargy after ingesting a pod of laundry detergent today. The symptoms are currently resolved. EXAM: PORTABLE CHEST 1 VIEW COMPARISON:  03/14/2018. FINDINGS: Poor inspiration. No gross change in a normal sized heart. The lungs are clear with stable mild prominence of the interstitial markings. Diffuse osteopenia. IMPRESSION: No acute abnormality. Stable mild chronic interstitial lung disease. Electronically Signed   By: Beckie Salts M.D.   On: 01/21/2019 17:55     Pending Labs Unresulted Labs (From admission, onward)    Start     Ordered   01/22/19 0500  Basic metabolic panel  Tomorrow morning,   R     01/22/19 0039   01/22/19 0500  CBC  Tomorrow morning,  R     01/22/19 0039   01/21/19 2257  Gastrointestinal Panel by PCR , Stool  (Gastrointestinal Panel by PCR, Stool)  Once,   R     01/21/19 2256          Vitals/Pain Today's Vitals   01/21/19 1914 01/21/19 2000 01/22/19 0130 01/22/19 0200  BP: 140/73 (!) 143/76 139/68 (!) 129/96  Pulse: 68 71 72 82  Resp: Temp:      TempSrc:      SpO2: 95% 95% 94% 94%  PainSc:        Isolation Precautions Enteric precautions (UV disinfection)  Medications Medications  acetaminophen (TYLENOL) tablet 500 mg (has no administration in time range)  amLODipine (NORVASC) tablet 5 mg (has no administration in time range)  atorvastatin (LIPITOR) tablet 10 mg (has no administration in time range)  EPINEPHrine (EPI-PEN) injection 0.3 mg (has no administration in time range)  meclizine (ANTIVERT) tablet 12.5 mg (has no administration in time range)  Melatonin TABS 3 mg (has no administration in time range)  Biotin TABS 10,000 mcg (has no administration in time range)  calcium carbonate (OSCAL) tablet 1,500 mg (has no administration in time range)  Vitamin D3 TABS 2,000 Units (has no administration in time range)  PRESERVISION AREDS 2 CAPS (has no administration in time range)  famotidine (PEPCID) IVPB 20 mg premix (20 mg Intravenous New Bag/Given 01/22/19 0214)  0.9 %  sodium chloride infusion ( Intravenous New Bag/Given 01/22/19 0214)  enoxaparin (LOVENOX) injection 40 mg (has no administration in time range)  ondansetron (ZOFRAN) tablet 4 mg (has no administration in time range)    Or  ondansetron (ZOFRAN) injection 4 mg (has no administration in time range)  hydrALAZINE (APRESOLINE) injection 5 mg (has no administration in time range)  sodium chloride 0.9 % bolus 500 mL (0 mLs  Intravenous Stopped 01/22/19 0215)  ondansetron (ZOFRAN) injection 4 mg (4 mg Intravenous Given 01/21/19 1753)  alum & mag hydroxide-simeth (MAALOX/MYLANTA) 200-200-20 MG/5ML suspension 30 mL (30 mLs Oral Given 01/21/19 1753)    And  lidocaine (XYLOCAINE) 2 % viscous mouth solution 15 mL (15 mLs Oral Given 01/21/19 1754)  loperamide (IMODIUM) capsule 4 mg (4 mg Oral Given 01/21/19 2119)  iohexol (OMNIPAQUE) 300 MG/ML solution 75 mL (75 mLs Intravenous Contrast Given 01/21/19 2128)  sodium chloride 0.9 % bolus 1,000 mL (0 mLs Intravenous Stopped 01/22/19 0007)    Mobility walks with device Moderate fall risk

## 2019-01-22 NOTE — Progress Notes (Addendum)
Clinical Social Worker following patient for support an discharge needs. CSW spoke with RN at [facility]. Per RN Irving Burton she stated that patient is from there ALF section. Irving Burton stated they do not transport residents therefor patient will need [facility] to transport her back to facility. Patients niece  agreeable for patient to go back to ALF. CSW fax summary  to facility.   Marrianne Mood, MSW,  Theresia Majors [phone]

## 2019-01-23 ENCOUNTER — Non-Acute Institutional Stay: Payer: Medicare Other | Admitting: Nurse Practitioner

## 2019-01-23 ENCOUNTER — Encounter: Payer: Self-pay | Admitting: Nurse Practitioner

## 2019-01-23 DIAGNOSIS — T5491XA Toxic effect of unspecified corrosive substance, accidental (unintentional), initial encounter: Secondary | ICD-10-CM | POA: Diagnosis not present

## 2019-01-23 DIAGNOSIS — K529 Noninfective gastroenteritis and colitis, unspecified: Secondary | ICD-10-CM | POA: Diagnosis not present

## 2019-01-23 DIAGNOSIS — E871 Hypo-osmolality and hyponatremia: Secondary | ICD-10-CM

## 2019-01-23 DIAGNOSIS — E876 Hypokalemia: Secondary | ICD-10-CM | POA: Insufficient documentation

## 2019-01-23 DIAGNOSIS — I10 Essential (primary) hypertension: Secondary | ICD-10-CM

## 2019-01-23 DIAGNOSIS — R413 Other amnesia: Secondary | ICD-10-CM

## 2019-01-23 LAB — HEPATIC FUNCTION PANEL
ALT: 19 (ref 7–35)
AST: 31 (ref 13–35)
Alkaline Phosphatase: 37 (ref 25–125)
Bilirubin, Total: 0.6

## 2019-01-23 LAB — BASIC METABOLIC PANEL
BUN: 14 (ref 4–21)
Creatinine: 0.6 (ref <=1.1)
Glucose: 89
POTASSIUM: 4.2 (ref 3.4–5.3)
SODIUM: 133 — AB (ref 137–147)

## 2019-01-23 LAB — CBC AND DIFFERENTIAL
HCT: 33 — AB (ref 36–46)
Hemoglobin: 11.6 — AB (ref 12.0–16.0)
Platelets: 271 (ref 150–399)
WBC: 5.5

## 2019-01-23 NOTE — Assessment & Plan Note (Signed)
K 3.1 01/22/19 ED, continue Kcl qd, update CMP 01/24/19

## 2019-01-23 NOTE — Assessment & Plan Note (Signed)
Blood pressure is in control, continue Amlodipine 5mg qd.  

## 2019-01-23 NOTE — Assessment & Plan Note (Signed)
Per CT 01/22/19, the patient denied nausea, vomiting, constipation, diarrhea, abd pain. She is afebrile. Observe.

## 2019-01-23 NOTE — Assessment & Plan Note (Signed)
Is at her baseline 133 08/21/18, 133 01/22/19 ED, update CMP 01/24/19

## 2019-01-23 NOTE — Assessment & Plan Note (Signed)
The patient needs close supervision for safety and care assistance, update MMSE

## 2019-01-23 NOTE — Assessment & Plan Note (Addendum)
The patient stated there is mild irritation in her throat, update CBC/diff, observe.

## 2019-01-23 NOTE — Progress Notes (Signed)
Location:  [facility] Room Number: 811 Place of Service:  ALF [phone]) Provider:  Marlana Latus  NP  Audris Speaker X, NP  Patient Care Team: Defne Gerling X, NP as PCP - General (Internal Medicine) Nahser, Wonda Cheng, MD as PCP - Cardiology (Cardiology) Melina Schools, MD as Consulting Physician (Orthopedic Surgery) Leonie Alexy Heldt, MD as Consulting Physician (Cardiology) Janella Rogala X, NP as Nurse Practitioner (Internal Medicine)  Extended Emergency Contact Information Primary Emergency Contact: [name],Heavenleigh Address: [address] [place] [address] Johnnette Litter of [place] Phone: [phone] Mobile Phone: [phone] Relation: Niece Secondary Emergency Contact: [name],[name]  [place] Mobile Phone: [phone] Relation: Niece  Code Status:  DNR Goals of care: Advanced Directive information Advanced Directives 01/22/2019  Does Patient Have a Medical Advance Directive? Yes  Type of Advance Directive Out of facility DNR (pink MOST or yellow form)  Does patient want to make changes to medical advance directive? No - Patient declined  Copy of [place] in Chart? -  Would patient like information on creating a medical advance directive? -  Pre-existing out of facility DNR order (yellow form or pink MOST form) -     Chief Complaint  Patient presents with  . Acute Visit    C/o- confusion, ED eval.    HPI:  Pt is a 85 y.o. female seen today for an acute visit for s/p ED evaluation 01/21/19-01/22/19 for ingestion of corrosive chemical-tide detergent pod. The patient thought it was a marshmallow candy. 01/22/19 Na 133, K 3.1, Bun 15, creat 0.58, wbc 9.2, Hgb 12.5, plt 260. Stool panel PCR negative, CT abd gastroenteritis. EKG SR.   Hx of HTN, blood pressure is controlled on Amlodipine '5mg'$  qd. She resides in [facility] for safety and care assistance.    Past Medical History:  Diagnosis Date  . Arteriosclerotic cardiovascular  disease   . Bee sting reaction 08/23/2016   Tongue swelling and difficulty swallowing /notes 08/23/2016  . Constipation   . Degenerative joint disease of right hip 10/15/14  . Fracture of multiple pubic rami (HCC) 10/15/14   Right inferior and superior pubic rami  . HOH (hard of hearing)    Wears bilateral hearing aids  . Hyperlipidemia   . Hypertension   . Macular degeneration   . Memory deficit   . Osteoporosis   . Pain in right foot   . Palpitations   . Paroxysmal atrial fibrillation (HCC)   . Thyroid nodule    Status post surgery  . Ulcer of hard palate   . Vertigo    Past Surgical History:  Procedure Laterality Date  . ABDOMINAL HYSTERECTOMY  1968  . APPENDECTOMY  1940's  . COLONOSCOPY    . FOOT SURGERY Left   . HARDWARE REMOVAL Right 02/16/2016   Procedure: HARDWARE REMOVAL RT HIP;  Surgeon: Renette Butters, MD;  Location: [facility];  Service: Orthopedics;  Laterality: Right;  . HIP ARTHROPLASTY Right 02/16/2016   Procedure: ARTHROPLASTY BIPOLAR HIP (HEMIARTHROPLASTY);  Surgeon: Renette Butters, MD;  Location: [facility];  Service: Orthopedics;  Laterality: Right;  . INTRAMEDULLARY (IM) NAIL INTERTROCHANTERIC Right 09/11/2015   Procedure: INTRAMEDULLARY (IM) NAIL INTERTROCHANTRIC;  Surgeon: Renette Butters, MD;  Location: [facility];  Service: Orthopedics;  Laterality: Right;  . KNEE SURGERY      No Known Allergies  Outpatient Encounter Medications as of 01/23/2019  Medication Sig  . acetaminophen (  TYLENOL) 500 MG tablet Take 500 mg by mouth every 6 (six) hours as needed.  Marland Kitchen amLODipine (NORVASC) 5 MG tablet Take 5 mg by mouth daily.  Marland Kitchen atorvastatin (LIPITOR) 10 MG tablet Take 10 mg by mouth daily.  . Biotin 10000 MCG TABS Take 10,000 mcg by mouth daily.   . calcium carbonate (OSCAL) 1500 (600 Ca) MG TABS tablet Take 600 mg of elemental calcium by mouth 2 (two) times daily with a meal.  . Cholecalciferol (VITAMIN D3) 2000 units TABS Take 2,000 Units by mouth daily.  Marland Kitchen docusate  sodium (COLACE) 100 MG capsule Take 100 mg by mouth 2 (two) times daily.   Marland Kitchen EPINEPHrine 0.3 mg/0.3 mL IJ SOAJ injection Inject 0.3 mLs (0.3 mg total) into the muscle once as needed (anaphylaxis/allergic reaction).  . meclizine (ANTIVERT) 12.5 MG tablet Take 12.5 mg by mouth every 12 (twelve) hours.   . Melatonin 3 MG TABS Take 3 mg by mouth at bedtime as needed (sleep).  . Multiple Vitamins-Minerals (PRESERVISION AREDS 2 PO) Take 2 capsules by mouth daily.   . Multiple Vitamins-Minerals (THEREMS-M) TABS Take 1 tablet by mouth daily.  . potassium chloride (K-DUR) 10 MEQ tablet Take 2 tablets (20 mEq total) by mouth daily.  . [DISCONTINUED] amLODipine (NORVASC) 5 MG tablet Take 1 tablet (5 mg total) by mouth daily.  . [DISCONTINUED] polyethylene glycol (MIRALAX / GLYCOLAX) packet Take 17 g by mouth every other day. HOLD FOR LOOSE STOOLS   No facility-administered encounter medications on file as of 01/23/2019.    ROS was provided with assistance of staff Review of Systems  Constitutional: Negative for activity change, appetite change, chills, diaphoresis, fatigue and fever.  HENT: Positive for hearing loss. Negative for congestion, drooling, ear pain, facial swelling, mouth sores, rhinorrhea, sinus pressure, sinus pain and voice change.        Throat irritation.   Respiratory: Negative for cough, shortness of breath and wheezing.   Cardiovascular: Negative for chest pain, palpitations and leg swelling.  Gastrointestinal: Negative for abdominal distention, abdominal pain, blood in stool, constipation and vomiting.  Genitourinary: Negative for difficulty urinating, dysuria, flank pain, frequency, hematuria and urgency.  Musculoskeletal: Positive for gait problem.  Skin: Negative for color change and pallor.  Neurological: Negative for dizziness, tremors, facial asymmetry, speech difficulty, weakness, light-headedness and headaches.       Memory lapses.   Psychiatric/Behavioral: Positive for  confusion. Negative for agitation, behavioral problems, hallucinations and sleep disturbance. The patient is not nervous/anxious.     Immunization History  Administered Date(s) Administered  . Influenza Whole 08/25/2018  . Influenza-Unspecified 09/07/2016, 09/12/2017  . PPD Test 02/18/2016  . Pneumococcal Conjugate-13 09/18/2017  . Tdap 10/07/2012   Pertinent  Health Maintenance Due  Topic Date Due  . PNA vac Low Risk Adult (2 of 2 - PPSV23) 09/18/2018  . INFLUENZA VACCINE  Completed  . DEXA SCAN  Completed   Fall Risk  08/17/2018 08/03/2017 10/24/2014  Falls in the past year? No No Yes  Number falls in past yr: - - 1  Injury with Fall? - - Yes  Risk Factor Category  - - High Fall Risk  Risk for fall due to : - - History of fall(s)   Functional Status Survey:    Vitals:   01/23/19 1140  BP: 128/68  Pulse: 80  Resp: 20  Temp: (!) 97.5 F (36.4 C)  SpO2: 93%  Weight: 127 lb (57.6 kg)  Height: '5\' 1"'$  (1.549 m)   Body mass  index is 24 kg/m. Physical Exam Constitutional:      General: She is not in acute distress.    Appearance: Normal appearance. She is normal weight. She is not ill-appearing or diaphoretic.  HENT:     Head: Normocephalic and atraumatic.     Nose: Nose normal.     Mouth/Throat:     Mouth: Mucous membranes are moist.  Eyes:     Extraocular Movements: Extraocular movements intact.     Conjunctiva/sclera: Conjunctivae normal.     Pupils: Pupils are equal, round, and reactive to light.  Neck:     Musculoskeletal: Normal range of motion and neck supple.  Cardiovascular:     Rate and Rhythm: Normal rate and regular rhythm.     Heart sounds: No murmur.  Pulmonary:     Effort: Pulmonary effort is normal.     Breath sounds: No wheezing, rhonchi or rales.  Abdominal:     General: Bowel sounds are normal. There is no distension.     Palpations: Abdomen is soft.     Tenderness: There is no right CVA tenderness, left CVA tenderness or guarding.    Musculoskeletal:     Right lower leg: No edema.     Left lower leg: No edema.     Comments: Ambulates with cane  Skin:    General: Skin is warm and dry.  Neurological:     General: No focal deficit present.     Mental Status: She is alert. Mental status is at baseline.     Cranial Nerves: No cranial nerve deficit.     Motor: No weakness.     Coordination: Coordination normal.     Gait: Gait abnormal.     Comments: Oriented to person and place.      Labs reviewed: Recent Labs    03/14/18 1725 03/15/18 0315 08/21/18 01/21/19 1727 01/22/19 0703  NA  --  132* 133* 130* 133*  K  --  3.8 4.2 3.7 3.1*  CL  --  100* 101 99 105  CO2  --  22 26 21* 20*  GLUCOSE  --  96  --  134* 97  BUN  --  '11 14 17 15  '$ CREATININE  --  0.63 0.7 0.72 0.58  CALCIUM  --  9.1 9.6 9.7 8.7*  MG 2.0 1.9  --   --  1.9   Recent Labs    03/14/18 1533 03/15/18 0315 08/21/18 01/21/19 1727  AST '30 26 24 '$ 33  ALT '20 18 15 22  '$ ALKPHOS 51 48 51 42  BILITOT 0.8 0.8  --  0.8  PROT 7.3 6.5 6.7 7.3  ALBUMIN 4.2 3.8 4.2 4.5   Recent Labs    03/14/18 1533  03/15/18 0315  11/01/18 01/21/19 1727 01/22/19 0703  WBC 7.1   < > 6.9   < > 6.1 7.3 9.2  NEUTROABS 4.9  --   --   --   --  5.4  --   HGB 13.4   < > 12.2   < > 12.8 12.9 12.5  HCT 37.0   < > 35.1*   < > 36 37.4 36.9  MCV 89.4   < > 88.4  --   --  92.3 92.3  PLT 289   < > 279   < > 280 275 260   < > = values in this interval not displayed.   Lab Results  Component Value Date   TSH 1.876 03/14/2018   Lab Results  Component Value Date   HGBA1C 5.4 03/14/2018   Lab Results  Component Value Date   CHOL 179 03/14/2018   HDL 70 03/14/2018   LDLCALC 95 03/14/2018   TRIG 69 03/14/2018   CHOLHDL 2.6 03/14/2018    Significant Diagnostic Results in last 30 days:  Ct Abdomen Pelvis W Contrast  Result Date: 01/21/2019 CLINICAL DATA:  Nausea, vomiting, diarrhea and abdominal pain after ingesting a laundry deterrent pod earlier today. The symptoms  have subsequently resolved. EXAM: CT ABDOMEN AND PELVIS WITH CONTRAST TECHNIQUE: Multidetector CT imaging of the abdomen and pelvis was performed using the standard protocol following bolus administration of intravenous contrast. CONTRAST:  63m OMNIPAQUE IOHEXOL 300 MG/ML  SOLN COMPARISON:  None. FINDINGS: Lower chest: Minimal bibasilar atelectasis/scarring. 4 mm average diameter nodule in the left lower lobe laterally on image number 3 series 4. Small calcified granuloma in the right lower lobe. Mildly enlarged heart, primarily due to left atrial enlargement. Hepatobiliary: Multiple small calcified gallstones in the gallbladder measuring up to 4 mm in maximum diameter each. No gallbladder wall thickening or pericholecystic fluid. Small cysts in the liver. Pancreas: Moderate diffuse pancreatic atrophy. Spleen: Normal in size without focal abnormality. Adrenals/Urinary Tract: Normal appearing adrenal glands. Small right renal cyst. Multiple areas of cortical scarring involving the left kidney. Obscuration of portions of the urinary bladder and distal ureters by streak artifacts produced by a right hip prosthesis. The visualized portions are unremarkable. Stomach/Bowel: Normal caliber colon filled with fluid and a small amount of gas. Normal caliber fluid-filled loops of small bowel. Mild diffuse wall thickening and enhancement involving this small bowel loops and the colon. Similar changes involving the stomach. No ingested foreign bodies are seen. Multiple sigmoid and descending colon diverticula as well as diverticula involving the splenic flexure and distal transverse colon. Surgically absent appendix. Vascular/Lymphatic: Atheromatous arterial calcifications without aneurysm. No enlarged lymph nodes. Reproductive: Status post hysterectomy. No adnexal masses. Other: No abdominal wall hernia or abnormality. No abdominopelvic ascites. Musculoskeletal: Right hip prosthesis with associated streak artifacts. Old,  healed right pubic bone fractures with partial bridging fusion of the symphysis pubis. Lumbar and lower thoracic spine degenerative changes. IMPRESSION: 1. Mild diffuse mucosal thickening and enhancement involving the stomach, small bowel and colon, predominantly filled with fluid. This is compatible with gastroenteritis. 2. Colonic diverticulosis. 3. Cholelithiasis. 4. 4 mm average diameter left lower lobe nodule. No follow-up needed if patient is low-risk. Non-contrast chest CT can be considered in 12 months if patient is high-risk. This recommendation follows the consensus statement: Guidelines for Management of Incidental Pulmonary Nodules Detected on CT Images: From the Fleischner Society 2017; Radiology 2017; 284:228-243. Electronically Signed   By: SClaudie ReveringM.D.   On: 01/21/2019 21:52   Dg Chest Port 1 View  Result Date: 01/21/2019 CLINICAL DATA:  Nausea, vomiting, diarrhea and lethargy after ingesting a pod of laundry detergent today. The symptoms are currently resolved. EXAM: PORTABLE CHEST 1 VIEW COMPARISON:  03/14/2018. FINDINGS: Poor inspiration. No gross change in a normal sized heart. The lungs are clear with stable mild prominence of the interstitial markings. Diffuse osteopenia. IMPRESSION: No acute abnormality. Stable mild chronic interstitial lung disease. Electronically Signed   By: SClaudie ReveringM.D.   On: 01/21/2019 17:55    Assessment/Plan Gastroenteritis Per CT 01/22/19, the patient denied nausea, vomiting, constipation, diarrhea, abd pain. She is afebrile. Observe.   Ingestion of corrosive chemical The patient stated there is mild irritation in her throat, update CBC/diff, observe.   Hyponatremia  Is at her baseline 133 08/21/18, 133 01/22/19 ED, update CMP 01/24/19  Hypokalemia K 3.1 01/22/19 ED, continue Kcl 29mq qd, update CMP 01/24/19  Memory deficit The patient needs close supervision for safety and care assistance, update MMSE  Essential hypertension Blood pressure is in  control, continue Amlodipine '5mg'$  qd.      Family/ staff Communication: plan of care reviewed with the patient and charge nurse.   Labs/tests ordered:  CBC/diff, CMP/eGFR 01/24/19  Time spend 25 minutes.

## 2019-01-31 LAB — BASIC METABOLIC PANEL
BUN: 11 (ref 4–21)
Creatinine: 0.7 (ref 0.5–1.1)
Glucose: 82
Potassium: 4.2 (ref 3.4–5.3)
Sodium: 133 — AB (ref 137–147)

## 2019-02-04 ENCOUNTER — Encounter: Payer: Self-pay | Admitting: Nurse Practitioner

## 2019-02-04 ENCOUNTER — Non-Acute Institutional Stay: Payer: Medicare Other | Admitting: Nurse Practitioner

## 2019-02-04 DIAGNOSIS — H8110 Benign paroxysmal vertigo, unspecified ear: Secondary | ICD-10-CM

## 2019-02-04 DIAGNOSIS — E871 Hypo-osmolality and hyponatremia: Secondary | ICD-10-CM

## 2019-02-04 DIAGNOSIS — E876 Hypokalemia: Secondary | ICD-10-CM | POA: Diagnosis not present

## 2019-02-04 DIAGNOSIS — K5909 Other constipation: Secondary | ICD-10-CM

## 2019-02-04 DIAGNOSIS — I10 Essential (primary) hypertension: Secondary | ICD-10-CM | POA: Diagnosis not present

## 2019-02-04 LAB — COMPLETE METABOLIC PANEL WITH GFR
Albumin: 3.6
Calcium: 8.9
Carbon Dioxide, Total: 21
Chloride: 106
EGFR (Non-African Amer.): 79
GLOBULIN: 2.1
Total Protein: 5.7 g/dL

## 2019-02-04 NOTE — Assessment & Plan Note (Signed)
Stable, continue Meclizine 12.5mg  bid.

## 2019-02-04 NOTE — Assessment & Plan Note (Addendum)
Stable, dc Kcl qd, last K 4.2 01/31/19, update BMP in 2 weeks

## 2019-02-04 NOTE — Assessment & Plan Note (Signed)
Blood pressure is controlled, continue Amlodipine 5mg qd.  

## 2019-02-04 NOTE — Assessment & Plan Note (Addendum)
Chronic, mild, last Na 133 01/31/19

## 2019-02-04 NOTE — Progress Notes (Signed)
Location:  [facility] Room Number: 822 Place of Service:  ALF [phone]) Provider:  Chipper Oman  NP  Nidya Bouyer X, NP  Patient Care Team: Nizar Cutler X, NP as PCP - General (Internal Medicine) Nahser, Deloris Ping, MD as PCP - Cardiology (Cardiology) Venita Lick, MD as Consulting Physician (Orthopedic Surgery) Marykay Lex, MD as Consulting Physician (Cardiology) Joelly Bolanos X, NP as Nurse Practitioner (Internal Medicine)  Extended Emergency Contact Information Primary Emergency Contact: [name],Sharika Address: [address]           [place], [place] [address] [place] of [place] Home Phone: [phone] Mobile Phone: [phone] Relation: Niece Secondary Emergency Contact: [name],[name]  [place] Mobile Phone: [phone] Relation: Niece  Code Status:  DNR Goals of care: Advanced Directive information Advanced Directives 01/22/2019  Does Patient Have a Medical Advance Directive? Yes  Type of Advance Directive Out of facility DNR (pink MOST or yellow form)  Does patient want to make changes to medical advance directive? No - Patient declined  Copy of Healthcare Power of Attorney in Chart? -  Would patient like information on creating a medical advance directive? -  Pre-existing out of facility DNR order (yellow form or pink MOST form) -     Chief Complaint  Patient presents with   Medical Management of Chronic Issues    HPI:  Pt is a 85 y.o. female seen today for medical management of chronic diseases.    The patient resides in [place] [facility] for safety and care assistance. HTN, blood pressure is controlled on Amlodipine  qd. Hypokalemia, on Kcl qd, last serum K. Constipation, stable on MiraLax qod, Colace  bid. Vertigo, stable on Meclizine 12.5mg  bid.    Past Medical History:  Diagnosis Date   Arteriosclerotic cardiovascular disease    Bee sting reaction 08/23/2016   Tongue swelling and difficulty swallowing /notes 08/23/2016     Constipation    Degenerative joint disease of right hip 10/15/14   Fracture of multiple pubic rami (HCC) 10/15/14   Right inferior and superior pubic rami   HOH (hard of hearing)    Wears bilateral hearing aids   Hyperlipidemia    Hypertension    Macular degeneration    Memory deficit    Osteoporosis    Pain in right foot    Palpitations    Paroxysmal atrial fibrillation (HCC)    Thyroid nodule    Status post surgery   Ulcer of hard palate    Vertigo    Past Surgical History:  Procedure Laterality Date   ABDOMINAL HYSTERECTOMY  1968   APPENDECTOMY  1940's   COLONOSCOPY     FOOT SURGERY Left    HARDWARE REMOVAL Right 02/16/2016   Procedure: HARDWARE REMOVAL RT HIP;  Surgeon: Sheral Apley, MD;  Location: [facility] OR;  Service: Orthopedics;  Laterality: Right;   HIP ARTHROPLASTY Right 02/16/2016   Procedure: ARTHROPLASTY BIPOLAR HIP (HEMIARTHROPLASTY);  Surgeon: Sheral Apley, MD;  Location: [facility] OR;  Service: Orthopedics;  Laterality: Right;   INTRAMEDULLARY (IM) NAIL INTERTROCHANTERIC Right 09/11/2015   Procedure: INTRAMEDULLARY (IM) NAIL INTERTROCHANTRIC;  Surgeon: Sheral Apley, MD;  Location: [facility] OR;  Service: Orthopedics;  Laterality: Right;   KNEE SURGERY      No Known Allergies  Outpatient Encounter Medications as of 02/04/2019  Medication Sig   acetaminophen (TYLENOL) 500 MG tablet Take 500 mg by mouth every 6 (six) hours as needed.   amLODipine (NORVASC) 5 MG tablet Take  5 mg by mouth daily.   atorvastatin (LIPITOR) 10 MG tablet Take 10 mg by mouth daily.   Biotin 16109 MCG TABS Take 10,000 mcg by mouth daily.    calcium carbonate (OSCAL) 1500 (600 Ca) MG TABS tablet Take 600 mg of elemental calcium by mouth 2 (two) times daily with a meal.   Cholecalciferol (VITAMIN D3) 2000 units TABS Take 2,000 Units by mouth daily.   docusate sodium (COLACE) 100 MG capsule Take 100 mg by mouth 2 (two) times daily.    EPINEPHrine 0.3 mg/0.3 mL IJ  SOAJ injection Inject 0.3 mLs (0.3 mg total) into the muscle once as needed (anaphylaxis/allergic reaction).   meclizine (ANTIVERT) 12.5 MG tablet Take 12.5 mg by mouth every 12 (twelve) hours.    Melatonin 3 MG TABS Take 3 mg by mouth at bedtime as needed (sleep).   Multiple Vitamins-Minerals (THEREMS-M) TABS Take 1 tablet by mouth daily.   polyethylene glycol (MIRALAX / GLYCOLAX) packet Take 17 g by mouth every other day.   potassium chloride (K-DUR) 10 MEQ tablet Take 2 tablets (20 mEq total) by mouth daily.   [DISCONTINUED] Multiple Vitamins-Minerals (PRESERVISION AREDS 2 PO) Take 2 capsules by mouth daily.    No facility-administered encounter medications on file as of 02/04/2019.    ROS was provided with assistance of staff.  Review of Systems  Constitutional: Negative for activity change, appetite change, chills, diaphoresis, fatigue, fever and unexpected weight change.  HENT: Positive for hearing loss. Negative for congestion and voice change.   Respiratory: Negative for cough, shortness of breath and wheezing.   Cardiovascular: Negative for chest pain, palpitations and leg swelling.  Gastrointestinal: Negative for abdominal distention, abdominal pain, constipation, diarrhea, nausea and vomiting.  Genitourinary: Negative for difficulty urinating, dysuria and urgency.  Musculoskeletal: Positive for gait problem.  Skin: Negative for color change and pallor.  Neurological: Positive for numbness. Negative for dizziness, speech difficulty, weakness and headaches.       Memory lapses. Chronic numbness in fingers, wear support gloves, not disabling.   Psychiatric/Behavioral: Negative for agitation, behavioral problems, hallucinations and sleep disturbance. The patient is not nervous/anxious.     Immunization History  Administered Date(s) Administered   Influenza Whole 08/25/2018   Influenza-Unspecified 09/07/2016, 09/12/2017   PPD Test 02/18/2016   Pneumococcal Conjugate-13  09/18/2017   Tdap 10/07/2012   Pertinent  Health Maintenance Due  Topic Date Due   PNA vac Low Risk Adult (2 of 2 - PPSV23) 09/18/2018   INFLUENZA VACCINE  Completed   DEXA SCAN  Completed   Fall Risk  08/17/2018 08/03/2017 10/24/2014  Falls in the past year? No No Yes  Number falls in past yr: - - 1  Injury with Fall? - - Yes  Risk Factor Category  - - High Fall Risk  Risk for fall due to : - - History of fall(s)   Functional Status Survey:    Vitals:   02/04/19 1125  BP: 136/62  Pulse: 75  Resp: 18  Temp: (!) 97.2 F (36.2 C)  SpO2: 97%  Weight: 127 lb (57.6 kg)  Height:  (1.549 m)   Body mass index is 24 kg/m. Physical Exam Constitutional:      General: She is not in acute distress.    Appearance: Normal appearance. She is normal weight. She is not ill-appearing, toxic-appearing or diaphoretic.  HENT:     Head: Normocephalic and atraumatic.     Nose: Nose normal. No congestion.     Mouth/Throat:  Mouth: Mucous membranes are moist.  Eyes:     Extraocular Movements: Extraocular movements intact.     Pupils: Pupils are equal, round, and reactive to light.  Neck:     Musculoskeletal: Normal range of motion and neck supple.  Cardiovascular:     Rate and Rhythm: Normal rate and regular rhythm.     Heart sounds: No murmur.  Pulmonary:     Effort: Pulmonary effort is normal.     Breath sounds: No wheezing, rhonchi or rales.  Abdominal:     General: There is no distension.     Palpations: Abdomen is soft.     Tenderness: There is no abdominal tenderness. There is no guarding or rebound.  Musculoskeletal:     Right lower leg: No edema.  Skin:    General: Skin is warm and dry.  Neurological:     General: No focal deficit present.     Mental Status: She is alert. Mental status is at baseline.     Cranial Nerves: No cranial nerve deficit.     Motor: No weakness.     Coordination: Coordination normal.     Gait: Gait abnormal.     Comments: Oriented to  person and place.   Psychiatric:        Mood and Affect: Mood normal.        Behavior: Behavior normal.        Thought Content: Thought content normal.     Labs reviewed: Recent Labs    03/14/18 1725 03/15/18 0315  01/21/19 1727 01/22/19 0703 01/23/19  NA  --  132*   < > 130* 133* 133*  K  --  3.8   < > 3.7 3.1* 4.2  CL  --  100*   < > 99 105 106  CO2  --  22   < > 21* 20* 21  GLUCOSE  --  96  --  134* 97  --   BUN  --  11   < > 17 15 14   CREATININE  --  0.63   < > 0.72 0.58 0.6  CALCIUM  --  9.1   < > 9.7 8.7* 8.9  MG 2.0 1.9  --   --  1.9  --    < > = values in this interval not displayed.   Recent Labs    03/14/18 1533 03/15/18 0315 08/21/18 01/21/19 1727 01/23/19  AST 30 26 24  33 31  ALT 20 18 15 22 19   ALKPHOS 51 48 51 42 37  BILITOT 0.8 0.8  --  0.8  --   PROT 7.3 6.5 6.7 7.3 5.7  ALBUMIN 4.2 3.8 4.2 4.5 3.6   Recent Labs    03/14/18 1533  03/15/18 0315  01/21/19 1727 01/22/19 0703 01/23/19  WBC 7.1   < > 6.9   < > 7.3 9.2 5.5  NEUTROABS 4.9  --   --   --  5.4  --   --   HGB 13.4   < > 12.2   < > 12.9 12.5 11.6*  HCT 37.0   < > 35.1*   < > 37.4 36.9 33*  MCV 89.4   < > 88.4  --  92.3 92.3  --   PLT 289   < > 279   < > 275 260 271   < > = values in this interval not displayed.   Lab Results  Component Value Date   TSH 1.876 03/14/2018   Lab  Results  Component Value Date   HGBA1C 5.4 03/14/2018   Lab Results  Component Value Date   CHOL 179 03/14/2018   HDL 70 03/14/2018   LDLCALC 95 03/14/2018   TRIG 69 03/14/2018   CHOLHDL 2.6 03/14/2018    Significant Diagnostic Results in last 30 days:  Ct Abdomen Pelvis W Contrast  Result Date: 01/21/2019 CLINICAL DATA:  Nausea, vomiting, diarrhea and abdominal pain after ingesting a laundry deterrent pod earlier today. The symptoms have subsequently resolved. EXAM: CT ABDOMEN AND PELVIS WITH CONTRAST TECHNIQUE: Multidetector CT imaging of the abdomen and pelvis was performed using the standard protocol  following bolus administration of intravenous contrast. CONTRAST:  48mL OMNIPAQUE IOHEXOL 300 MG/ML  SOLN COMPARISON:  None. FINDINGS: Lower chest: Minimal bibasilar atelectasis/scarring. 4 mm average diameter nodule in the left lower lobe laterally on image number 3 series 4. Small calcified granuloma in the right lower lobe. Mildly enlarged heart, primarily due to left atrial enlargement. Hepatobiliary: Multiple small calcified gallstones in the gallbladder measuring up to 4 mm in maximum diameter each. No gallbladder wall thickening or pericholecystic fluid. Small cysts in the liver. Pancreas: Moderate diffuse pancreatic atrophy. Spleen: Normal in size without focal abnormality. Adrenals/Urinary Tract: Normal appearing adrenal glands. Small right renal cyst. Multiple areas of cortical scarring involving the left kidney. Obscuration of portions of the urinary bladder and distal ureters by streak artifacts produced by a right hip prosthesis. The visualized portions are unremarkable. Stomach/Bowel: Normal caliber colon filled with fluid and a small amount of gas. Normal caliber fluid-filled loops of small bowel. Mild diffuse wall thickening and enhancement involving this small bowel loops and the colon. Similar changes involving the stomach. No ingested foreign bodies are seen. Multiple sigmoid and descending colon diverticula as well as diverticula involving the splenic flexure and distal transverse colon. Surgically absent appendix. Vascular/Lymphatic: Atheromatous arterial calcifications without aneurysm. No enlarged lymph nodes. Reproductive: Status post hysterectomy. No adnexal masses. Other: No abdominal wall hernia or abnormality. No abdominopelvic ascites. Musculoskeletal: Right hip prosthesis with associated streak artifacts. Old, healed right pubic bone fractures with partial bridging fusion of the symphysis pubis. Lumbar and lower thoracic spine degenerative changes. IMPRESSION: 1. Mild diffuse mucosal  thickening and enhancement involving the stomach, small bowel and colon, predominantly filled with fluid. This is compatible with gastroenteritis. 2. Colonic diverticulosis. 3. Cholelithiasis. 4. 4 mm average diameter left lower lobe nodule. No follow-up needed if patient is low-risk. Non-contrast chest CT can be considered in 12 months if patient is high-risk. This recommendation follows the consensus statement: Guidelines for Management of Incidental Pulmonary Nodules Detected on CT Images: From the Fleischner Society 2017; Radiology 2017; 284:228-243. Electronically Signed   By: Beckie Salts M.D.   On: 01/21/2019 21:52   Dg Chest Port 1 View  Result Date: 01/21/2019 CLINICAL DATA:  Nausea, vomiting, diarrhea and lethargy after ingesting a pod of laundry detergent today. The symptoms are currently resolved. EXAM: PORTABLE CHEST 1 VIEW COMPARISON:  03/14/2018. FINDINGS: Poor inspiration. No gross change in a normal sized heart. The lungs are clear with stable mild prominence of the interstitial markings. Diffuse osteopenia. IMPRESSION: No acute abnormality. Stable mild chronic interstitial lung disease. Electronically Signed   By: Beckie Salts M.D.   On: 01/21/2019 17:55    Assessment/Plan Essential hypertension Blood pressure is controlled, continue Amlodipine 5mg  qd.   Chronic constipation Stable, continue MiraLax qod, colace 100mg  bid.   Benign paroxysmal positional vertigo Stable, continue Meclizine 12.5mg  bid.   Hypokalemia Stable, dc  Kcl qd, last K 4.2 01/31/19, update BMP in 2 weeks  Hyponatremia Chronic, mild, last Na 133 01/31/19     Family/ staff Communication: plan of care reviewed with the patient and charge nurse.   Labs/tests ordered: BMP 2 weeks.   Time spend 25 minutes.

## 2019-02-04 NOTE — Assessment & Plan Note (Signed)
Stable, continue MiraLax qod, colace 100mg  bid.

## 2019-02-20 LAB — BASIC METABOLIC PANEL
BUN: 10 (ref 4–21)
Creatinine: 0.8 (ref 0.5–1.1)
Glucose: 87
Potassium: 4.3 (ref 3.4–5.3)
Sodium: 134 — AB (ref 137–147)

## 2019-05-17 ENCOUNTER — Encounter: Payer: Self-pay | Admitting: Internal Medicine

## 2019-05-17 NOTE — Progress Notes (Signed)
A user error has taken place.

## 2019-06-21 ENCOUNTER — Encounter: Payer: Self-pay | Admitting: Nurse Practitioner

## 2019-06-21 ENCOUNTER — Non-Acute Institutional Stay: Payer: Medicare Other | Admitting: Nurse Practitioner

## 2019-06-21 DIAGNOSIS — I10 Essential (primary) hypertension: Secondary | ICD-10-CM | POA: Diagnosis not present

## 2019-06-21 DIAGNOSIS — H903 Sensorineural hearing loss, bilateral: Secondary | ICD-10-CM

## 2019-06-21 DIAGNOSIS — R413 Other amnesia: Secondary | ICD-10-CM | POA: Diagnosis not present

## 2019-06-21 NOTE — Assessment & Plan Note (Signed)
01/24/19 MMSE 25/30, failed clock  She recalled President Trump, [facility], and today is Friday and last day of the month 06/21/19 MMSE CBC/diff, CMP/eGFR, TSH

## 2019-06-21 NOTE — Progress Notes (Signed)
Location:    Nursing Home Room Number: 822 Place of Service:  ALF (13) Provider: Marlana Latus NP  Edmundo Tedesco X, NP  Patient Care Team: Tiffanni Scarfo X, NP as PCP - General (Internal Medicine) Nahser, Wonda Cheng, MD as PCP - Cardiology (Cardiology) Melina Schools, MD as Consulting Physician (Orthopedic Surgery) Leonie Foday Cone, MD as Consulting Physician (Cardiology) Saulo Anthis X, NP as Nurse Practitioner (Internal Medicine)  Extended Emergency Contact Information Primary Emergency Contact: [name],Garnette Address: [place] [address] Johnnette Litter of [place] Phone: [phone] Mobile Phone: [phone] Relation: Niece Secondary Emergency Contact: [name],[name]  [place] Mobile Phone: [phone] Relation: Niece  Code Status: DNR Goals of care: Advanced Directive information Advanced Directives 06/21/2019  Does Patient Have a Medical Advance Directive? Yes  Type of Paramedic of [place];Living will;Out of facility DNR (pink MOST or yellow form)  Does patient want to make changes to medical advance directive? No - Patient declined  Copy of [place] in Chart? Yes - validated most recent copy scanned in chart (See row information)  Would patient like information on creating a medical advance directive? -  Pre-existing out of facility DNR order (yellow form or pink MOST form) Yellow form placed in chart (order not valid for inpatient use)     Chief Complaint  Patient presents with  . Acute Visit    C/o - confusion    HPI:  Pt is a 85 y.o. female seen today for an acute visit for progressing of memory deficit pre family and staff. The patient resides in [place] [facility] for safety and care assistance, HOH R>L, speaker needs to adjust tone of voice to be heard. Hx of HTN, blood pressure is controlled on Amlodipine '5mg'$  qd.    Past Medical History:  Diagnosis Date  . Arteriosclerotic cardiovascular disease    . Bee sting reaction 08/23/2016   Tongue swelling and difficulty swallowing /notes 08/23/2016  . Constipation   . Degenerative joint disease of right hip 10/15/14  . Fracture of multiple pubic rami (HCC) 10/15/14   Right inferior and superior pubic rami  . HOH (hard of hearing)    Wears bilateral hearing aids  . Hyperlipidemia   . Hypertension   . Macular degeneration   . Memory deficit   . Osteoporosis   . Pain in right foot   . Palpitations   . Paroxysmal atrial fibrillation (HCC)   . Thyroid nodule    Status post surgery  . Ulcer of hard palate   . Vertigo    Past Surgical History:  Procedure Laterality Date  . ABDOMINAL HYSTERECTOMY  1968  . APPENDECTOMY  1940's  . COLONOSCOPY    . FOOT SURGERY Left   . HARDWARE REMOVAL Right 02/16/2016   Procedure: HARDWARE REMOVAL RT HIP;  Surgeon: Renette Butters, MD;  Location: [place];  Service: Orthopedics;  Laterality: Right;  . HIP ARTHROPLASTY Right 02/16/2016   Procedure: ARTHROPLASTY BIPOLAR HIP (HEMIARTHROPLASTY);  Surgeon: Renette Butters, MD;  Location: [place];  Service: Orthopedics;  Laterality: Right;  . INTRAMEDULLARY (IM) NAIL INTERTROCHANTERIC Right 09/11/2015   Procedure: INTRAMEDULLARY (IM) NAIL INTERTROCHANTRIC;  Surgeon: Renette Butters, MD;  Location: [place];  Service: Orthopedics;  Laterality: Right;  . KNEE SURGERY      No Known Allergies  Allergies as of 06/21/2019   No Known Allergies     Medication List  Accurate as of June 21, 2019  2:17 PM. If you have any questions, ask your nurse or doctor.        acetaminophen 500 MG tablet Commonly known as: TYLENOL Take 500 mg by mouth every 6 (six) hours as needed.   amLODipine 5 MG tablet Commonly known as: NORVASC Take 5 mg by mouth daily.   atorvastatin 10 MG tablet Commonly known as: LIPITOR Take 10 mg by mouth daily.   Biotin 10000 MCG Tabs Take 10,000 mcg by mouth daily.   calcium carbonate 1500 (600 Ca) MG Tabs tablet Commonly known  as: OSCAL Take 600 mg of elemental calcium by mouth 2 (two) times daily with a meal.   docusate sodium 100 MG capsule Commonly known as: COLACE Take 100 mg by mouth 2 (two) times daily.   EPINEPHrine 0.3 mg/0.3 mL Soaj injection Commonly known as: EPI-PEN Inject 0.3 mLs (0.3 mg total) into the muscle once as needed (anaphylaxis/allergic reaction).   meclizine 12.5 MG tablet Commonly known as: ANTIVERT Take 12.5 mg by mouth every 12 (twelve) hours.   Melatonin 3 MG Tabs Take 3 mg by mouth at bedtime as needed (sleep).   polyethylene glycol 17 g packet Commonly known as: MIRALAX / GLYCOLAX Take 17 g by mouth every other day.   Therems-M Tabs Take 1 tablet by mouth daily.   PreserVision AREDS Tabs Take 2 tablets by mouth daily.   Vitamin D3 50 MCG (2000 UT) Tabs Take 2,000 Units by mouth daily.      ROS was provided with assistance of staff Review of Systems  Constitutional: Positive for unexpected weight change. Negative for activity change, appetite change, chills, diaphoresis, fatigue and fever.       Gradual, about #3 Ibs weight loss in the past month.   HENT: Positive for hearing loss. Negative for voice change.        Hearing aids, L is better than the R  Respiratory: Negative for cough, shortness of breath and wheezing.   Cardiovascular: Negative for chest pain and leg swelling.  Gastrointestinal: Negative for abdominal distention, abdominal pain, constipation, diarrhea, nausea and vomiting.  Genitourinary: Negative for difficulty urinating, dysuria and urgency.  Musculoskeletal: Positive for gait problem.  Skin: Negative for color change and pallor.  Neurological: Negative for dizziness, facial asymmetry, speech difficulty, weakness and headaches.       Memory lapses.   Psychiatric/Behavioral: Negative for agitation, behavioral problems, hallucinations and sleep disturbance. The patient is not nervous/anxious.     Immunization History  Administered Date(s)  Administered  . Influenza Whole 08/25/2018  . Influenza-Unspecified 09/07/2016, 09/12/2017  . PPD Test 02/18/2016  . Pneumococcal Conjugate-13 09/18/2017  . Tdap 10/07/2012   Pertinent  Health Maintenance Due  Topic Date Due  . PNA vac Low Risk Adult (2 of 2 - PPSV23) 09/18/2018  . INFLUENZA VACCINE  06/22/2019  . DEXA SCAN  Completed   Fall Risk  08/17/2018 08/03/2017 10/24/2014  Falls in the past year? No No Yes  Number falls in past yr: - - 1  Injury with Fall? - - Yes  Risk Factor Category  - - High Fall Risk  Risk for fall due to : - - History of fall(s)   Functional Status Survey:    Vitals:   06/21/19 1254  BP: 140/70  Pulse: 67  Resp: 18  Temp: (!) 97.5 F (36.4 C)  SpO2: 96%  Weight: 121 lb (54.9 kg)   Body mass index is 22.49 kg/m. Physical Exam Vitals  signs and nursing note reviewed.  Constitutional:      General: She is not in acute distress.    Appearance: Normal appearance. She is normal weight. She is not ill-appearing, toxic-appearing or diaphoretic.  HENT:     Head: Normocephalic and atraumatic.     Nose: Nose normal.     Mouth/Throat:     Mouth: Mucous membranes are moist.  Eyes:     Extraocular Movements: Extraocular movements intact.     Conjunctiva/sclera: Conjunctivae normal.     Pupils: Pupils are equal, round, and reactive to light.  Neck:     Musculoskeletal: Normal range of motion and neck supple.  Cardiovascular:     Rate and Rhythm: Normal rate and regular rhythm.     Heart sounds: No murmur.  Abdominal:     General: Bowel sounds are normal.     Palpations: Abdomen is soft.     Tenderness: There is no abdominal tenderness. There is no right CVA tenderness, left CVA tenderness, guarding or rebound.  Musculoskeletal:     Right lower leg: No edema.     Left lower leg: No edema.     Comments: Unsteady gait  Skin:    General: Skin is warm and dry.  Neurological:     General: No focal deficit present.     Mental Status: She is  alert. Mental status is at baseline.     Cranial Nerves: No cranial nerve deficit.     Motor: No weakness.     Coordination: Coordination normal.     Gait: Gait abnormal.     Comments: Oriented to person and place, recalled President Trump, [place], today is Friday.   Psychiatric:        Mood and Affect: Mood normal.        Behavior: Behavior normal.        Thought Content: Thought content normal.     Labs reviewed: Recent Labs    01/21/19 1727 01/22/19 0703 01/23/19 01/31/19  NA 130* 133* 133* 133*  K 3.7 3.1* 4.2 4.2  CL 99 105 106  --   CO2 21* 20* 21  --   GLUCOSE 134* 97  --   --   BUN '17 15 14 11  '$ CREATININE 0.72 0.58 0.6 0.7  CALCIUM 9.7 8.7* 8.9  --   MG  --  1.9  --   --    Recent Labs    08/21/18 01/21/19 1727 01/23/19  AST 24 33 31  ALT '15 22 19  '$ ALKPHOS 51 42 37  BILITOT  --  0.8  --   PROT 6.7 7.3 5.7  ALBUMIN 4.2 4.5 3.6   Recent Labs    01/21/19 1727 01/22/19 0703 01/23/19  WBC 7.3 9.2 5.5  NEUTROABS 5.4  --   --   HGB 12.9 12.5 11.6*  HCT 37.4 36.9 33*  MCV 92.3 92.3  --   PLT 275 260 271   Lab Results  Component Value Date   TSH 1.876 03/14/2018   Lab Results  Component Value Date   HGBA1C 5.4 03/14/2018   Lab Results  Component Value Date   CHOL 179 03/14/2018   HDL 70 03/14/2018   LDLCALC 95 03/14/2018   TRIG 69 03/14/2018   CHOLHDL 2.6 03/14/2018    Significant Diagnostic Results in last 30 days:  No results found.  Assessment/Plan: Memory deficit 01/24/19 MMSE 25/30, failed clock  She recalled President Trump, [place], and today is Friday and last  day of the month 06/21/19 MMSE CBC/diff, CMP/eGFR, TSH   Sensorineural hearing loss (SNHL) of both ears Speaker needs to adjust tone of voice to be heard, she also uses lip reading to help her communicate. Continue haring aids.  Essential hypertension Blood pressure is controlled, continue Amlodipine '5mg'$  qd.     Family/ staff Communication: plan of care  reviewed with the patient and charge nurse.   Labs/tests ordered: 06/21/19 MMSE CBC/diff, CMP/eGFR, TSH  Time spend 40 minutes.

## 2019-06-21 NOTE — Assessment & Plan Note (Signed)
Speaker needs to adjust tone of voice to be heard, she also uses lip reading to help her communicate. Continue haring aids.

## 2019-06-21 NOTE — Assessment & Plan Note (Signed)
Blood pressure is controlled, continue Amlodipine 5mg qd.  

## 2019-06-25 LAB — BASIC METABOLIC PANEL
BUN: 10 (ref 4–21)
Creatinine: 0.6 (ref 0.5–1.1)
Glucose: 88
Potassium: 4.3 (ref 3.4–5.3)
Sodium: 132 — AB (ref 137–147)

## 2019-06-25 LAB — CBC AND DIFFERENTIAL
HCT: 35 — AB (ref 36–46)
Hemoglobin: 12.4 (ref 12.0–16.0)
Neutrophils Absolute: 3483
Platelets: 300 (ref 150–399)
WBC: 5.4

## 2019-06-25 LAB — HEPATIC FUNCTION PANEL
ALT: 14 (ref 7–35)
AST: 20 (ref 13–35)
Alkaline Phosphatase: 42 (ref 25–125)
Bilirubin, Total: 0.7

## 2019-06-25 LAB — TSH: TSH: 2.07 (ref 0.41–5.90)

## 2019-08-29 ENCOUNTER — Encounter: Payer: Self-pay | Admitting: Internal Medicine

## 2019-08-29 LAB — CHLORIDE
Albumin: 4.1
Calcium: 9.3
Calcium: 9.7
Carbon Dioxide, Total: 24
Carbon Dioxide, Total: 26
Chloride: 101
Chloride: 104
Globulin: 2.6
Total Protein: 6.7 (ref 6.4–8.2)

## 2019-08-29 NOTE — Progress Notes (Signed)
This encounter was created in error - please disregard.

## 2019-09-03 ENCOUNTER — Encounter: Payer: Self-pay | Admitting: Internal Medicine

## 2019-09-03 NOTE — Progress Notes (Signed)
This encounter was created in error - please disregard.

## 2019-09-10 ENCOUNTER — Encounter: Payer: Self-pay | Admitting: Internal Medicine

## 2019-09-10 ENCOUNTER — Non-Acute Institutional Stay: Payer: Medicare Other | Admitting: Internal Medicine

## 2019-09-10 DIAGNOSIS — I480 Paroxysmal atrial fibrillation: Secondary | ICD-10-CM

## 2019-09-10 DIAGNOSIS — M810 Age-related osteoporosis without current pathological fracture: Secondary | ICD-10-CM

## 2019-09-10 DIAGNOSIS — H8110 Benign paroxysmal vertigo, unspecified ear: Secondary | ICD-10-CM

## 2019-09-10 DIAGNOSIS — E782 Mixed hyperlipidemia: Secondary | ICD-10-CM

## 2019-09-10 DIAGNOSIS — I10 Essential (primary) hypertension: Secondary | ICD-10-CM

## 2019-09-10 DIAGNOSIS — R413 Other amnesia: Secondary | ICD-10-CM

## 2019-09-10 NOTE — Progress Notes (Signed)
Location:    [facility] Room Number: 822 Place of Service:  ALF (13) Provider:  Einar CrowGupta, Anjali MD  Mast, Man X, NP  Patient Care Team: Mast, Man X, NP as PCP - General (Internal Medicine) Nahser, Deloris PingPhilip J, MD as PCP - Cardiology (Cardiology) Venita LickBrooks, Dahari, MD as Consulting Physician (Orthopedic Surgery) Marykay LexHarding, David W, MD as Consulting Physician (Cardiology) Mast, Man X, NP as Nurse Practitioner (Internal Medicine)  Extended Emergency Contact Information Primary Emergency Contact: [name],Paislei Address: [address]           [place], [place] [id number] [place] Home Phone: [phone] Mobile Phone: [phone] Relation: Niece Secondary Emergency Contact: [name],[name]  [place] Mobile Phone: [phone] Relation: Niece  Code Status:  DNR Goals of care: Advanced Directive information Advanced Directives 09/10/2019  Does Patient Have a Medical Advance Directive? Yes  Type of Advance Directive Out of facility DNR (pink MOST or yellow form)  Does patient want to make changes to medical advance directive? No - Patient declined  Copy of Healthcare Power of Attorney in Chart? -  Would patient like information on creating a medical advance directive? -  Pre-existing out of facility DNR order (yellow form or pink MOST form) -     Chief Complaint  Patient presents with  . Medical Management of Chronic Issues  . Health Maintenance    PNA and influenza vaccine    HPI:  Pt is a 85 y.o. female seen today for medical management of chronic diseases.   She has h/o Hypertension, Hyperlipidemia, Cognitive Impairment, PAF Not Clear Documentation Only on ASpirin,BPPV, Vit D def, Hyponatremia, h/o PVC  Patient is doing well in facility. Walks with the walker. No recent Falls. Always walks around outside per Nurses. Very Active. Appetite is fair Weight is stable  Past Medical History:  Diagnosis Date  . Arteriosclerotic cardiovascular disease   . Bee  sting reaction 08/23/2016   Tongue swelling and difficulty swallowing /notes 08/23/2016  . Constipation   . Degenerative joint disease of right hip 10/15/14  . Fracture of multiple pubic rami (HCC) 10/15/14   Right inferior and superior pubic rami  . HOH (hard of hearing)    Wears bilateral hearing aids  . Hyperlipidemia   . Hypertension   . Macular degeneration   . Memory deficit   . Osteoporosis   . Pain in right foot   . Palpitations   . Paroxysmal atrial fibrillation (HCC)   . Thyroid nodule    Status post surgery  . Ulcer of hard palate   . Vertigo    Past Surgical History:  Procedure Laterality Date  . ABDOMINAL HYSTERECTOMY  1968  . APPENDECTOMY  1940's  . COLONOSCOPY    . FOOT SURGERY Left   . HARDWARE REMOVAL Right 02/16/2016   Procedure: HARDWARE REMOVAL RT HIP;  Surgeon: Sheral Apleyimothy D Murphy, MD;  [facility];  Service: Orthopedics;  Laterality: Right;  . HIP ARTHROPLASTY Right 02/16/2016   Procedure: ARTHROPLASTY BIPOLAR HIP (HEMIARTHROPLASTY);  Surgeon: Sheral Apleyimothy D Murphy, MD;  [facility];  Service: Orthopedics;  Laterality: Right;  . INTRAMEDULLARY (IM) NAIL INTERTROCHANTERIC Right 09/11/2015   Procedure: INTRAMEDULLARY (IM) NAIL INTERTROCHANTRIC;  Surgeon: Sheral Apleyimothy D Murphy, MD;  Location: [facility] OR;  Service: Orthopedics;  Laterality: Right;  . KNEE SURGERY      No Known Allergies  Allergies as of 09/10/2019   No Known Allergies     Medication List       Accurate as of  September 10, 2019  9:44 AM. If you have any questions, ask your nurse or doctor.        acetaminophen 500 MG tablet Commonly known as: TYLENOL Take 500 mg by mouth every 6 (six) hours as needed.   amLODipine 5 MG tablet Commonly known as: NORVASC Take 5 mg by mouth daily.   atorvastatin 10 MG tablet Commonly known as: LIPITOR Take 10 mg by mouth daily.   Biotin 29518 MCG Tabs Take 10,000 mcg by mouth daily.   calcium carbonate 1500 (600 Ca) MG Tabs tablet Commonly known as:  OSCAL Take 600 mg of elemental calcium by mouth 2 (two) times daily with a meal.   docusate sodium 100 MG capsule Commonly known as: COLACE Take 100 mg by mouth 2 (two) times daily.   EPINEPHrine 0.3 mg/0.3 mL Soaj injection Commonly known as: EPI-PEN Inject 0.3 mLs (0.3 mg total) into the muscle once as needed (anaphylaxis/allergic reaction).   lactose free nutrition Liqd Take 237 mLs by mouth. Once a day at breakfast   meclizine 12.5 MG tablet Commonly known as: ANTIVERT Take 12.5 mg by mouth every 12 (twelve) hours.   Melatonin 3 MG Tabs Take 3 mg by mouth at bedtime as needed (sleep).   memantine 10 MG tablet Commonly known as: NAMENDA Take 10 mg by mouth 2 (two) times daily.   polyethylene glycol 17 g packet Commonly known as: MIRALAX / GLYCOLAX Take 17 g by mouth every other day.   Therems-M Tabs Take 1 tablet by mouth daily.   PreserVision AREDS Tabs Take 2 tablets by mouth daily.   Vitamin D3 50 MCG (2000 UT) Tabs Take 2,000 Units by mouth daily.       Review of Systems  Review of Systems  Constitutional: Negative for activity change, appetite change, chills, diaphoresis, fatigue and fever.  HENT: Negative for mouth sores, postnasal drip, rhinorrhea, sinus pain and sore throat.   Respiratory: Negative for apnea, cough, chest tightness, shortness of breath and wheezing.   Cardiovascular: Negative for chest pain, palpitations and leg swelling.  Gastrointestinal: Negative for abdominal distention, abdominal pain, constipation, diarrhea, nausea and vomiting.  Genitourinary: Negative for dysuria and frequency.  Musculoskeletal: Negative for arthralgias, joint swelling and myalgias.  Skin: Negative for rash.  Neurological: Negative for dizziness, syncope, weakness, light-headedness and numbness.  Psychiatric/Behavioral: Negative for behavioral problems, confusion and sleep disturbance.     Immunization History  Administered Date(s) Administered  . Influenza  Whole 08/25/2018  . Influenza-Unspecified 09/07/2016, 09/12/2017  . PPD Test 02/18/2016  . Pneumococcal Conjugate-13 09/18/2017  . Tdap 10/07/2012   Pertinent  Health Maintenance Due  Topic Date Due  . PNA vac Low Risk Adult (2 of 2 - PPSV23) 09/18/2018  . INFLUENZA VACCINE  06/22/2019  . DEXA SCAN  Completed   Fall Risk  08/17/2018 08/03/2017 10/24/2014  Falls in the past year? No No Yes  Number falls in past yr: - - 1  Injury with Fall? - - Yes  Risk Factor Category  - - High Fall Risk  Risk for fall due to : - - History of fall(s)   Functional Status Survey:    Vitals:   09/10/19 0939  BP: 136/70  Pulse: 72  Resp: 20  Temp: (!) 97.4 F (36.3 C)  SpO2: 97%  Weight: 124 lb 6.4 oz (56.4 kg)  Height: 5' 1.5" (1.562 m)   Body mass index is 23.12 kg/m. Physical Exam  Constitutional:  Well-developed and well-nourished.  HENT:  Head:  Normocephalic.  Mouth/Throat: Oropharynx is clear and moist.  Eyes: Pupils are equal, round, and reactive to light.  Neck: Neck supple.  Cardiovascular: Normal rate and normal heart sounds.  No murmur heard. Pulmonary/Chest: Effort normal and breath sounds normal. No respiratory distress. No wheezes. She has no rales.  Abdominal: Soft. Bowel sounds are normal. No distension. There is no tenderness. There is no rebound.  Musculoskeletal: No edema.  Lymphadenopathy: none Neurological: No Focal deficits Skin: Skin is warm and dry.  Psychiatric: Normal mood and affect. Behavior is normal. Thought content normal.    Labs reviewed: Recent Labs    01/21/19 1727 01/22/19 0703  01/23/19 01/31/19 02/20/19 06/25/19  NA 130* 133*   < > 133* 133* 134* 132*  K 3.7 3.1*  --  4.2 4.2 4.3 4.3  CL 99 105  --  106  --  104 101  CO2 21* 20*  --  21  --  26 24  GLUCOSE 134* 97  --   --   --   --   --   BUN 17 15   < > 14 11 10 10   CREATININE 0.72 0.58   < > 0.6 0.7 0.8 0.6  CALCIUM 9.7 8.7*  --  8.9  --  9.3 9.7  MG  --  1.9  --   --   --   --   --     < > = values in this interval not displayed.   Recent Labs    01/21/19 1727 01/23/19 06/25/19  AST 33 31 20  ALT 22 19 14   ALKPHOS 42 37 42  BILITOT 0.8  --   --   PROT 7.3 5.7 6.7  ALBUMIN 4.5 3.6 4.1   Recent Labs    01/21/19 1727 01/22/19 0703 01/23/19 06/25/19  WBC 7.3 9.2 5.5 5.4  NEUTROABS 5.4  --   --  3,483  HGB 12.9 12.5 11.6* 12.4  HCT 37.4 36.9 33* 35*  MCV 92.3 92.3  --   --   PLT 275 260 271 300   Lab Results  Component Value Date   TSH 2.07 06/25/2019   Lab Results  Component Value Date   HGBA1C 5.4 03/14/2018   Lab Results  Component Value Date   CHOL 179 03/14/2018   HDL 70 03/14/2018   LDLCALC 95 03/14/2018   TRIG 69 03/14/2018   CHOLHDL 2.6 03/14/2018    Significant Diagnostic Results in last 30 days:  No results found.  Assessment/Plan Memory deficit Continue to be stable on Namenda No Behavior issues   PAF (paroxysmal atrial fibrillation) ([place]) Not definite diagnosis Only on aspirin  Age-related osteoporosis Has been on reclast before. Last Tscore was 2.1 Would continue Vit D and Calcium  H/o BPPV Has not had any recent issues  Essential Hypertension Has h/o Labile Hypertension Staying stable on Norvasc  Hyperlipidemia On Lipitor   Family/ staff Communication:   Labs/tests ordered:   Total time spent in this patient care encounter was  25_  minutes; greater than 50% of the visit spent counseling patient and staff, reviewing records , Labs and coordinating care for problems addressed at this encounter.

## 2019-12-04 ENCOUNTER — Encounter: Payer: Self-pay | Admitting: Nurse Practitioner

## 2019-12-04 ENCOUNTER — Non-Acute Institutional Stay: Payer: Medicare PPO | Admitting: Nurse Practitioner

## 2019-12-04 DIAGNOSIS — R413 Other amnesia: Secondary | ICD-10-CM

## 2019-12-04 DIAGNOSIS — I10 Essential (primary) hypertension: Secondary | ICD-10-CM | POA: Diagnosis not present

## 2019-12-04 DIAGNOSIS — Z03818 Encounter for observation for suspected exposure to other biological agents ruled out: Secondary | ICD-10-CM | POA: Diagnosis not present

## 2019-12-04 DIAGNOSIS — H8110 Benign paroxysmal vertigo, unspecified ear: Secondary | ICD-10-CM

## 2019-12-04 DIAGNOSIS — E871 Hypo-osmolality and hyponatremia: Secondary | ICD-10-CM

## 2019-12-04 DIAGNOSIS — K5909 Other constipation: Secondary | ICD-10-CM

## 2019-12-04 NOTE — Progress Notes (Signed)
Location:   [place] Room Number: 500 Place of Service:  ALF (13) Provider:  Braylon Grenda NP  Seferino Oscar X, NP  Patient Care Team: Silviano Neuser X, NP as PCP - General (Internal Medicine) Nahser, Wonda Cheng, MD as PCP - Cardiology (Cardiology) Melina Schools, MD as Consulting Physician (Orthopedic Surgery) Leonie Janifer Gieselman, MD as Consulting Physician (Cardiology) Buna Cuppett X, NP as Nurse Practitioner (Internal Medicine)  Extended Emergency Contact Information Primary Emergency Contact: [name],Myla Address: [place] [address] [place] Phone: [phone] Mobile Phone: [phone] Relation: Niece Secondary Emergency Contact: [name],[name]  [place] Mobile Phone: [phone] Relation: Niece  Code Status:  DNR Goals of care: Advanced Directive information Advanced Directives 12/04/2019  Does Patient Have a Medical Advance Directive? Yes  Type of Advance Directive Out of facility DNR (pink MOST or yellow form)  Does patient want to make changes to medical advance directive? No - Patient declined  Copy of [place] in Chart? -  Would patient like information on creating a medical advance directive? -  Pre-existing out of facility DNR order (yellow form or pink MOST form) Yellow form placed in chart (order not valid for inpatient use);Pink MOST form placed in chart (order not valid for inpatient use)     Chief Complaint  Patient presents with  . Medical Management of Chronic Issues  . Health Maintenance    PPSV23    HPI:  Pt is a 86 y.o. female seen today for medical management of chronic diseases.    The patient resides in [facility] for safety, care assistance, on Memantine 10mg  bid for memory. Hx of HTN, blood pressure is controlled on Amlodipine 5mg  qd. Vertigo, stable, on Meclizine 12.5mg  bid. Constipation, stable, on MiraLax qod, Colace bid.    Past Medical History:    Diagnosis Date  . Arteriosclerotic cardiovascular disease   . Bee sting reaction 08/23/2016   Tongue swelling and difficulty swallowing /notes 08/23/2016  . Constipation   . Degenerative joint disease of right hip 10/15/14  . Fracture of multiple pubic rami (HCC) 10/15/14   Right inferior and superior pubic rami  . HOH (hard of hearing)    Wears bilateral hearing aids  . Hyperlipidemia   . Hypertension   . Macular degeneration   . Memory deficit   . Osteoporosis   . Pain in right foot   . Palpitations   . Paroxysmal atrial fibrillation (HCC)   . Thyroid nodule    Status post surgery  . Ulcer of hard palate   . Vertigo    Past Surgical History:  Procedure Laterality Date  . ABDOMINAL HYSTERECTOMY  1968  . APPENDECTOMY  1940's  . COLONOSCOPY    . FOOT SURGERY Left   . HARDWARE REMOVAL Right 02/16/2016   Procedure: HARDWARE REMOVAL RT HIP;  Surgeon: Renette Butters, MD;  Location: [facility];  Service: Orthopedics;  Laterality: Right;  . HIP ARTHROPLASTY Right 02/16/2016   Procedure: ARTHROPLASTY BIPOLAR HIP (HEMIARTHROPLASTY);  Surgeon: Renette Butters, MD;  Location: [facility];  Service: Orthopedics;  Laterality: Right;  . INTRAMEDULLARY (IM) NAIL INTERTROCHANTERIC Right 09/11/2015   Procedure: INTRAMEDULLARY (IM) NAIL INTERTROCHANTRIC;  Surgeon: Renette Butters, MD;  Location: [facility];  Service: Orthopedics;  Laterality: Right;  . KNEE SURGERY      No Known Allergies  Allergies as of 12/04/2019   No Known  Allergies     Medication List       Accurate as of December 04, 2019  3:17 PM. If you have any questions, ask your nurse or doctor.        acetaminophen 500 MG tablet Commonly known as: TYLENOL Take 500 mg by mouth every 6 (six) hours as needed.   amLODipine 5 MG tablet Commonly known as: NORVASC Take 5 mg by mouth daily.   atorvastatin 10 MG tablet Commonly known as: LIPITOR Take 10 mg by mouth daily.   Biotin 24580 MCG Tabs Take 10,000 mcg by mouth daily.    calcium carbonate 1500 (600 Ca) MG Tabs tablet Commonly known as: OSCAL Take 600 mg of elemental calcium by mouth 2 (two) times daily with a meal.   docusate sodium 100 MG capsule Commonly known as: COLACE Take 100 mg by mouth 2 (two) times daily.   EPINEPHrine 0.3 mg/0.3 mL Soaj injection Commonly known as: EPI-PEN Inject 0.3 mLs (0.3 mg total) into the muscle once as needed (anaphylaxis/allergic reaction).   lactose free nutrition Liqd Take 237 mLs by mouth. Once a day at breakfast   meclizine 12.5 MG tablet Commonly known as: ANTIVERT Take 12.5 mg by mouth every 12 (twelve) hours.   Melatonin 3 MG Tabs Take 3 mg by mouth at bedtime as needed (sleep).   memantine 10 MG tablet Commonly known as: NAMENDA Take 10 mg by mouth 2 (two) times daily.   polyethylene glycol 17 g packet Commonly known as: MIRALAX / GLYCOLAX Take 17 g by mouth every other day.   Therems-M Tabs Take 1 tablet by mouth daily.   PreserVision AREDS Tabs Take 2 tablets by mouth daily.   Vitamin D3 50 MCG (2000 UT) Tabs Take 2,000 Units by mouth daily.     ROS was provided with assistance of staff.   Review of Systems  Constitutional: Negative for activity change, appetite change, chills, diaphoresis, fatigue, fever and unexpected weight change.  HENT: Positive for hearing loss. Negative for congestion and voice change.        Hears better left ear  Respiratory: Negative for cough, shortness of breath and wheezing.   Cardiovascular: Negative for chest pain, palpitations and leg swelling.  Gastrointestinal: Negative for abdominal distention, abdominal pain, constipation, diarrhea, nausea and vomiting.  Genitourinary: Negative for difficulty urinating, dysuria and urgency.  Musculoskeletal: Positive for gait problem.  Skin: Negative for color change and pallor.  Neurological: Negative for dizziness, speech difficulty, weakness and headaches.       Memory lapses.   Psychiatric/Behavioral: Negative  for agitation, behavioral problems, hallucinations and sleep disturbance. The patient is not nervous/anxious.     Immunization History  Administered Date(s) Administered  . Influenza Whole 08/25/2018  . Influenza, High Dose Seasonal PF 08/24/2019  . Influenza-Unspecified 09/07/2016, 09/12/2017  . Moderna SARS-COVID-2 Vaccination 11/23/2019  . PPD Test 02/18/2016  . Pneumococcal Conjugate-13 09/18/2017  . Tdap 10/07/2012   Pertinent  Health Maintenance Due  Topic Date Due  . PNA vac Low Risk Adult (2 of 2 - PPSV23) 09/18/2018  . INFLUENZA VACCINE  Completed  . DEXA SCAN  Completed   Fall Risk  08/17/2018 08/03/2017 10/24/2014  Falls in the past year? No No Yes  Number falls in past yr: - - 1  Injury with Fall? - - Yes  Risk Factor Category  - - High Fall Risk  Risk for fall due to : - - History of fall(s)   Functional Status Survey:  Vitals:   12/04/19 1014  BP: 132/64  Pulse: 72  Resp: 20  Temp: (!) 97 F (36.1 C)  SpO2: 96%  Weight: 127 lb (57.6 kg)  Height: 5' 1.5" (1.562 m)   Body mass index is 23.61 kg/m. Physical Exam Vitals and nursing note reviewed.  Constitutional:      General: She is not in acute distress.    Appearance: Normal appearance. She is normal weight. She is not ill-appearing, toxic-appearing or diaphoretic.  HENT:     Head: Normocephalic and atraumatic.     Nose: Nose normal.     Mouth/Throat:     Mouth: Mucous membranes are moist.  Eyes:     Extraocular Movements: Extraocular movements intact.     Conjunctiva/sclera: Conjunctivae normal.     Pupils: Pupils are equal, round, and reactive to light.  Cardiovascular:     Rate and Rhythm: Normal rate and regular rhythm.     Heart sounds: No murmur.  Pulmonary:     Effort: Pulmonary effort is normal.     Breath sounds: No wheezing or rales.  Chest:     Chest wall: No tenderness.  Abdominal:     General: Bowel sounds are normal. There is no distension.     Palpations: Abdomen is soft.      Tenderness: There is no abdominal tenderness. There is no right CVA tenderness, left CVA tenderness, guarding or rebound.  Musculoskeletal:     Cervical back: Normal range of motion and neck supple.     Right lower leg: No edema.     Left lower leg: No edema.  Skin:    General: Skin is warm and dry.  Neurological:     General: No focal deficit present.     Mental Status: She is alert. Mental status is at baseline.     Motor: No weakness.     Coordination: Coordination normal.     Gait: Gait abnormal.     Comments: Oriented to person, place.   Psychiatric:        Mood and Affect: Mood normal.        Behavior: Behavior normal.        Thought Content: Thought content normal.        Judgment: Judgment normal.     Labs reviewed: Recent Labs    01/21/19 1727 01/22/19 0703 01/22/19 0703 01/23/19 0000 01/31/19 0000 02/20/19 0000 06/25/19 0000  NA 130* 133*   < > 133* 133* 134* 132*  K 3.7 3.1*  --  4.2 4.2 4.3 4.3  CL 99 105  --  106  --  104 101  CO2 21* 20*  --  21  --  26 24  GLUCOSE 134* 97  --   --   --   --   --   BUN 17 15   < > 14 11 10 10   CREATININE 0.72 0.58   < > 0.6 0.7 0.8 0.6  CALCIUM 9.7 8.7*  --  8.9  --  9.3 9.7  MG  --  1.9  --   --   --   --   --    < > = values in this interval not displayed.   Recent Labs    01/21/19 1727 01/23/19 0000 06/25/19 0000  AST 33 31 20  ALT 22 19 14   ALKPHOS 42 37 42  BILITOT 0.8  --   --   PROT 7.3 5.7 6.7  ALBUMIN 4.5 3.6 4.1  Recent Labs    01/21/19 1727 01/22/19 0703 01/23/19 0000 06/25/19 0000  WBC 7.3 9.2 5.5 5.4  NEUTROABS 5.4  --   --  3,483  HGB 12.9 12.5 11.6* 12.4  HCT 37.4 36.9 33* 35*  MCV 92.3 92.3  --   --   PLT 275 260 271 300   Lab Results  Component Value Date   TSH 2.07 06/25/2019   Lab Results  Component Value Date   HGBA1C 5.4 03/14/2018   Lab Results  Component Value Date   CHOL 179 03/14/2018   HDL 70 03/14/2018   LDLCALC 95 03/14/2018   TRIG 69 03/14/2018   CHOLHDL  2.6 03/14/2018    Significant Diagnostic Results in last 30 days:  No results found.  Assessment/Plan Essential hypertension Blood pressure is controlled, continue Amlodipine.   Chronic constipation Stable, continue MiraLax, Colace.   Benign paroxysmal positional vertigo Stable, continue Meclizine.   Hyponatremia At her baseline, serum Na 130s.   Memory deficit Functioning well in [facility], continue Memantine      Family/ staff Communication: plan of care reviewed with the patient and charge nurse.   Labs/tests ordered:  none  Time spend 40 minutes .

## 2019-12-04 NOTE — Assessment & Plan Note (Signed)
Blood pressure is controlled, continue Amlodipine.  

## 2019-12-04 NOTE — Assessment & Plan Note (Signed)
At her baseline, serum Na 130s.

## 2019-12-04 NOTE — Assessment & Plan Note (Signed)
Stable, continue Meclizine.  

## 2019-12-04 NOTE — Assessment & Plan Note (Signed)
Stable, continue MiraLax, Colace.  

## 2019-12-04 NOTE — Assessment & Plan Note (Signed)
Functioning well in [facility], continue Memantine.  

## 2019-12-16 DIAGNOSIS — H353113 Nonexudative age-related macular degeneration, right eye, advanced atrophic without subfoveal involvement: Secondary | ICD-10-CM | POA: Diagnosis not present

## 2019-12-16 DIAGNOSIS — H43811 Vitreous degeneration, right eye: Secondary | ICD-10-CM | POA: Diagnosis not present

## 2019-12-16 DIAGNOSIS — H353211 Exudative age-related macular degeneration, right eye, with active choroidal neovascularization: Secondary | ICD-10-CM | POA: Diagnosis not present

## 2019-12-16 DIAGNOSIS — H353222 Exudative age-related macular degeneration, left eye, with inactive choroidal neovascularization: Secondary | ICD-10-CM | POA: Diagnosis not present

## 2019-12-16 DIAGNOSIS — H35351 Cystoid macular degeneration, right eye: Secondary | ICD-10-CM | POA: Diagnosis not present

## 2019-12-18 DIAGNOSIS — Z20828 Contact with and (suspected) exposure to other viral communicable diseases: Secondary | ICD-10-CM | POA: Diagnosis not present

## 2019-12-25 DIAGNOSIS — Z20828 Contact with and (suspected) exposure to other viral communicable diseases: Secondary | ICD-10-CM | POA: Diagnosis not present

## 2020-01-01 DIAGNOSIS — Z20828 Contact with and (suspected) exposure to other viral communicable diseases: Secondary | ICD-10-CM | POA: Diagnosis not present

## 2020-01-08 DIAGNOSIS — Z20828 Contact with and (suspected) exposure to other viral communicable diseases: Secondary | ICD-10-CM | POA: Diagnosis not present

## 2020-02-18 DIAGNOSIS — H353113 Nonexudative age-related macular degeneration, right eye, advanced atrophic without subfoveal involvement: Secondary | ICD-10-CM | POA: Diagnosis not present

## 2020-02-18 DIAGNOSIS — H35351 Cystoid macular degeneration, right eye: Secondary | ICD-10-CM | POA: Diagnosis not present

## 2020-02-18 DIAGNOSIS — H353211 Exudative age-related macular degeneration, right eye, with active choroidal neovascularization: Secondary | ICD-10-CM | POA: Diagnosis not present

## 2020-02-18 DIAGNOSIS — H35721 Serous detachment of retinal pigment epithelium, right eye: Secondary | ICD-10-CM | POA: Diagnosis not present

## 2020-02-18 DIAGNOSIS — H43811 Vitreous degeneration, right eye: Secondary | ICD-10-CM | POA: Diagnosis not present

## 2020-03-03 ENCOUNTER — Encounter: Payer: Self-pay | Admitting: Internal Medicine

## 2020-03-03 ENCOUNTER — Non-Acute Institutional Stay: Payer: Medicare PPO | Admitting: Internal Medicine

## 2020-03-03 DIAGNOSIS — H8110 Benign paroxysmal vertigo, unspecified ear: Secondary | ICD-10-CM

## 2020-03-03 DIAGNOSIS — I480 Paroxysmal atrial fibrillation: Secondary | ICD-10-CM

## 2020-03-03 DIAGNOSIS — I10 Essential (primary) hypertension: Secondary | ICD-10-CM

## 2020-03-03 DIAGNOSIS — B029 Zoster without complications: Secondary | ICD-10-CM | POA: Diagnosis not present

## 2020-03-03 DIAGNOSIS — R413 Other amnesia: Secondary | ICD-10-CM | POA: Diagnosis not present

## 2020-03-03 NOTE — Progress Notes (Signed)
Location:   [facility] Room Number: 822 Place of Service:  ALF [phone]) Provider:  Einar Crow MD  Mast, Man X, NP  Patient Care Team: Mast, Man X, NP as PCP - General (Internal Medicine) Nahser, Deloris Ping, MD as PCP - Cardiology (Cardiology) Venita Lick, MD as Consulting Physician (Orthopedic Surgery) Marykay Lex, MD as Consulting Physician (Cardiology) Mast, Man X, NP as Nurse Practitioner (Internal Medicine)  Extended Emergency Contact Information Primary Emergency Contact: [name],Adonna Address: [address]           [place], [place] [address] [place] of [place] Home Phone: [phone] Mobile Phone: [phone] Relation: Niece Secondary Emergency Contact: [name],[name]  [place] Mobile Phone: [phone] Relation: Niece  Code Status:  DNR Goals of care: Advanced Directive information Advanced Directives 03/03/2020  Does Patient Have a Medical Advance Directive? Yes  Type of Advance Directive Out of facility DNR (pink MOST or yellow form)  Does patient want to make changes to medical advance directive? No - Patient declined  Copy of Healthcare Power of Attorney in Chart? -  Would patient like information on creating a medical advance directive? -  Pre-existing out of facility DNR order (yellow form or pink MOST form) Yellow form placed in chart (order not valid for inpatient use);Pink MOST form placed in chart (order not valid for inpatient use)     Chief Complaint  Patient presents with  . Acute Visit    Mid back pain    HPI:  Pt is a 86 y.o. female seen today for an acute visit for  Mid Back Pain  She has h/o Hypertension, Hyperlipidemia, Cognitive Impairment, PAF Not Clear Documentation Only on ASpirin,BPPV, Vit D def, Hyponatremia, h/o PVC  Lives in [place]. Has been c/o Mid back pain for last few days Nurses noticed she has rash on one side of her Back. Patient says she feels pain like Needles on that side. No fever  or chills. No itching  Past Medical History:  Diagnosis Date  . Arteriosclerotic cardiovascular disease   . Bee sting reaction 08/23/2016   Tongue swelling and difficulty swallowing /notes 08/23/2016  . Constipation   . Degenerative joint disease of right hip 10/15/14  . Fracture of multiple pubic rami (HCC) 10/15/14   Right inferior and superior pubic rami  . HOH (hard of hearing)    Wears bilateral hearing aids  . Hyperlipidemia   . Hypertension   . Macular degeneration   . Memory deficit   . Osteoporosis   . Pain in right foot   . Palpitations   . Paroxysmal atrial fibrillation (HCC)   . Thyroid nodule    Status post surgery  . Ulcer of hard palate   . Vertigo    Past Surgical History:  Procedure Laterality Date  . ABDOMINAL HYSTERECTOMY  1968  . APPENDECTOMY  1940's  . COLONOSCOPY    . FOOT SURGERY Left   . HARDWARE REMOVAL Right 02/16/2016   Procedure: HARDWARE REMOVAL RT HIP;  Surgeon: Sheral Apley, MD;  Location: [facility] OR;  Service: Orthopedics;  Laterality: Right;  . HIP ARTHROPLASTY Right 02/16/2016   Procedure: ARTHROPLASTY BIPOLAR HIP (HEMIARTHROPLASTY);  Surgeon: Sheral Apley, MD;  Location: [facility] OR;  Service: Orthopedics;  Laterality: Right;  . INTRAMEDULLARY (IM) NAIL INTERTROCHANTERIC Right 09/11/2015   Procedure: INTRAMEDULLARY (IM) NAIL INTERTROCHANTRIC;  Surgeon: Sheral Apley, MD;  Location: [facility] OR;  Service: Orthopedics;  Laterality: Right;  . KNEE SURGERY  No Known Allergies  Allergies as of 03/03/2020   No Known Allergies     Medication List       Accurate as of March 03, 2020  3:53 PM. If you have any questions, ask your nurse or doctor.        acetaminophen 500 MG tablet Commonly known as: TYLENOL Take 500 mg by mouth every 6 (six) hours as needed.   amLODipine 5 MG tablet Commonly known as: NORVASC Take 5 mg by mouth daily.   atorvastatin 10 MG tablet Commonly known as: LIPITOR Take 10 mg by mouth daily.   Biotin 10000  MCG Tabs Take 10,000 mcg by mouth daily.   calcium carbonate 1500 (600 Ca) MG Tabs tablet Commonly known as: OSCAL Take 600 mg of elemental calcium by mouth 2 (two) times daily with a meal.   docusate sodium 100 MG capsule Commonly known as: COLACE Take 100 mg by mouth 2 (two) times daily.   EPINEPHrine 0.3 mg/0.3 mL Soaj injection Commonly known as: EPI-PEN Inject 0.3 mLs (0.3 mg total) into the muscle once as needed (anaphylaxis/allergic reaction).   lactose free nutrition Liqd Take 237 mLs by mouth. Once a day at breakfast   meclizine 12.5 MG tablet Commonly known as: ANTIVERT Take 12.5 mg by mouth every 12 (twelve) hours.   melatonin 3 MG Tabs tablet Take 3 mg by mouth at bedtime as needed (sleep).   memantine 10 MG tablet Commonly known as: NAMENDA Take 10 mg by mouth 2 (two) times daily.   polyethylene glycol 17 g packet Commonly known as: MIRALAX / GLYCOLAX Take 17 g by mouth every other day.   Therems-M Tabs Take 1 tablet by mouth daily.   PreserVision AREDS Tabs Take 2 tablets by mouth daily.   Vitamin D3 50 MCG (2000 UT) Tabs Take 2,000 Units by mouth daily.       Review of Systems  Review of Systems  Constitutional: Negative for activity change, appetite change, chills, diaphoresis, fatigue and fever.  HENT: Negative for mouth sores, postnasal drip, rhinorrhea, sinus pain and sore throat.   Respiratory: Negative for apnea, cough, chest tightness, shortness of breath and wheezing.   Cardiovascular: Negative for chest pain, palpitations and leg swelling.  Gastrointestinal: Negative for abdominal distention, abdominal pain, constipation, diarrhea, nausea and vomiting.  Genitourinary: Negative for dysuria and frequency.  Musculoskeletal: Negative for arthralgias, joint swelling and myalgias.  Skin: Negative for rash.  Neurological: Negative for dizziness, syncope, weakness, light-headedness and numbness.  Psychiatric/Behavioral: Negative for behavioral  problems, confusion and sleep disturbance.     Immunization History  Administered Date(s) Administered  . Influenza Whole 08/25/2018  . Influenza, High Dose Seasonal PF 08/24/2019  . Influenza-Unspecified 09/07/2016, 09/12/2017  . Moderna SARS-COVID-2 Vaccination 11/23/2019, 12/21/2019  . PPD Test 02/18/2016  . Pneumococcal Conjugate-13 09/18/2017  . Tdap 10/07/2012   Pertinent  Health Maintenance Due  Topic Date Due  . PNA vac Low Risk Adult (2 of 2 - PPSV23) 09/18/2018  . INFLUENZA VACCINE  06/21/2020  . DEXA SCAN  Completed   Fall Risk  08/17/2018 08/03/2017 10/24/2014  Falls in the past year? No No Yes  Number falls in past yr: - - 1  Injury with Fall? - - Yes  Risk Factor Category  - - High Fall Risk  Risk for fall due to : - - History of fall(s)   Functional Status Survey:    Vitals:   03/03/20 1548  BP: 100/60  Pulse: 72  Resp: 18  Temp: (!) 96.8 F (36 C)  SpO2: 97%  Weight: 123 lb 9.6 oz (56.1 kg)  Height: 5\' 5"  (1.651 m)   Body mass index is 20.57 kg/m. Physical Exam  Constitutional:  Well-developed and well-nourished.  HENT:  Head: Normocephalic.  Mouth/Throat: Oropharynx is clear and moist.  Eyes: Pupils are equal, round, and reactive to light.  Neck: Neck supple.  Cardiovascular: Normal rate and normal heart sounds.  No murmur heard. Pulmonary/Chest: Effort normal and breath sounds normal. No respiratory distress. No wheezes. She has no rales.  Abdominal: Soft. Bowel sounds are normal. No distension. There is no tenderness. There is no rebound.  Musculoskeletal: No edema.  Lymphadenopathy: none Neurological: No Focal Deficits Skin: Skin is warm and dry. Has Vesicular small rash in Mid back. Not going across the Mid line Psychiatric: Normal mood and affect. Behavior is normal. Thought content normal.    Labs reviewed: Recent Labs    06/25/19 0000  NA 132*  K 4.3  CL 101  CO2 24  BUN 10  CREATININE 0.6  CALCIUM 9.7   Recent Labs     06/25/19 0000  AST 20  ALT 14  ALKPHOS 42  PROT 6.7  ALBUMIN 4.1   Recent Labs    06/25/19 0000  WBC 5.4  NEUTROABS 3,483  HGB 12.4  HCT 35*  PLT 300   Lab Results  Component Value Date   TSH 2.07 06/25/2019   Lab Results  Component Value Date   HGBA1C 5.4 03/14/2018   Lab Results  Component Value Date   CHOL 179 03/14/2018   HDL 70 03/14/2018   LDLCALC 95 03/14/2018   TRIG 69 03/14/2018   CHOLHDL 2.6 03/14/2018    Significant Diagnostic Results in last 30 days:  No results found.  Assessment/Plan Shingles Star on Vancyclovir 1000 mg Q8 for 7 Days Tylenol for Pain Contact isolation   Other Issues Memory deficit Continue to be stable on Namenda No Behavior issues   PAF (paroxysmal atrial fibrillation) (HCC) Not definite diagnosis Only on aspirin  Age-related osteoporosis Has been on reclast before. Last Tscore was 2.1 Would continue Vit D and Calcium  H/o BPPV Has not had any recent issues  Essential Hypertension Has h/o Labile Hypertension Staying stable on Norvasc  Hyperlipidemia On Lipitor  Family/ staff Communication:   Labs/tests ordered:  CBC.CMP,Fasting Lipid

## 2020-03-04 ENCOUNTER — Encounter: Payer: Self-pay | Admitting: Nurse Practitioner

## 2020-03-04 DIAGNOSIS — B029 Zoster without complications: Secondary | ICD-10-CM | POA: Insufficient documentation

## 2020-03-05 ENCOUNTER — Non-Acute Institutional Stay: Payer: Medicare PPO | Admitting: Nurse Practitioner

## 2020-03-05 ENCOUNTER — Encounter: Payer: Self-pay | Admitting: Nurse Practitioner

## 2020-03-05 DIAGNOSIS — I10 Essential (primary) hypertension: Secondary | ICD-10-CM | POA: Diagnosis not present

## 2020-03-05 DIAGNOSIS — E871 Hypo-osmolality and hyponatremia: Secondary | ICD-10-CM

## 2020-03-05 DIAGNOSIS — R413 Other amnesia: Secondary | ICD-10-CM

## 2020-03-05 DIAGNOSIS — D649 Anemia, unspecified: Secondary | ICD-10-CM | POA: Diagnosis not present

## 2020-03-05 DIAGNOSIS — B029 Zoster without complications: Secondary | ICD-10-CM

## 2020-03-05 DIAGNOSIS — D509 Iron deficiency anemia, unspecified: Secondary | ICD-10-CM | POA: Diagnosis not present

## 2020-03-05 LAB — CBC AND DIFFERENTIAL
HCT: 39 (ref 36–46)
Hemoglobin: 13.5 (ref 12.0–16.0)
Neutrophils Absolute: 818
Platelets: 254 (ref 150–399)
WBC: 5.8

## 2020-03-05 LAB — BASIC METABOLIC PANEL
BUN: 12 (ref 4–21)
CO2: 22 (ref 13–22)
Chloride: 97 — AB (ref 99–108)
Creatinine: 0.6 (ref 0.5–1.1)
Glucose: 100
Potassium: 4 (ref 3.4–5.3)
Sodium: 126 — AB (ref 137–147)

## 2020-03-05 LAB — HEPATIC FUNCTION PANEL
ALT: 13 (ref 7–35)
AST: 20 (ref 13–35)
Alkaline Phosphatase: 54 (ref 25–125)
Bilirubin, Total: 0.6

## 2020-03-05 LAB — LIPID PANEL
Cholesterol: 183 (ref 0–200)
HDL: 70 (ref 35–70)
LDL Cholesterol: 94
LDl/HDL Ratio: 2.6
Triglycerides: 97 (ref 40–160)

## 2020-03-05 LAB — COMPREHENSIVE METABOLIC PANEL
Albumin: 4 (ref 3.5–5.0)
Calcium: 9.2 (ref 8.7–10.7)
GFR calc Af Amer: 91
GFR calc non Af Amer: 79
Globulin: 2.6

## 2020-03-05 LAB — CBC: RBC: 4.39 (ref 3.87–5.11)

## 2020-03-05 NOTE — Assessment & Plan Note (Signed)
Blood pressure is controlled, continue Amlodipine.  

## 2020-03-05 NOTE — Assessment & Plan Note (Signed)
01/04/18 Na 133, 06/25/19 Na 132, 03/05/20 Na 126, K 4.0, Bun 12, creat 0.60, eGFR 79, wbc 5.8, Hgb 13.5, plt 254, neutrophils 76.5 Encourage oral fluid intake, repeat BMP in am.

## 2020-03-05 NOTE — Assessment & Plan Note (Signed)
Started Valcyclovir x 7 days since 03/03/20

## 2020-03-05 NOTE — Progress Notes (Signed)
Location:   [place] Room Number: 024 Place of Service:  ALF (13) Provider: Marlana Latus NP  Salome Hautala X, NP  Patient Care Team: Shahidah Nesbitt X, NP as PCP - General (Internal Medicine) Nahser, Wonda Cheng, MD as PCP - Cardiology (Cardiology) Melina Schools, MD as Consulting Physician (Orthopedic Surgery) Leonie Jeanmarc Viernes, MD as Consulting Physician (Cardiology) Jaquez Farrington X, NP as Nurse Practitioner (Internal Medicine)  Extended Emergency Contact Information Primary Emergency Contact: [name],Kainat Address: [place] [address] Johnnette Litter of [place] Phone: [phone] Mobile Phone: [phone] Relation: Niece Secondary Emergency Contact: [name],[name]  [place] Mobile Phone: [phone] Relation: Niece  Code Status:  DNR Goals of care: Advanced Directive information Advanced Directives 03/05/2020  Does Patient Have a Medical Advance Directive? Yes  Type of Advance Directive Out of facility DNR (pink MOST or yellow form)  Does patient want to make changes to medical advance directive? No - Patient declined  Copy of [place] in Chart? -  Would patient like information on creating a medical advance directive? -  Pre-existing out of facility DNR order (yellow form or pink MOST form) Yellow form placed in chart (order not valid for inpatient use);Pink MOST form placed in chart (order not valid for inpatient use)     Chief Complaint  Patient presents with  . Acute Visit    Low Sodium    HPI:  Pt is a 86 y.o. female seen today for worsened hyponatremia, serum Na 126 03/05/20, her baseline is 130s. Onset of shingles, oral intake is limited. Nor leukocytosis or left shifting, she is afebrile. Hx of HTN, blood pressure is controlled on Amlodipine. The patient resides in [place] [facility] for safety, care assistance.    Past Medical History:  Diagnosis Date  . Arteriosclerotic cardiovascular disease   .  Bee sting reaction 08/23/2016   Tongue swelling and difficulty swallowing /notes 08/23/2016  . Constipation   . Degenerative joint disease of right hip 10/15/14  . Fracture of multiple pubic rami (HCC) 10/15/14   Right inferior and superior pubic rami  . HOH (hard of hearing)    Wears bilateral hearing aids  . Hyperlipidemia   . Hypertension   . Macular degeneration   . Memory deficit   . Osteoporosis   . Pain in right foot   . Palpitations   . Paroxysmal atrial fibrillation (HCC)   . Thyroid nodule    Status post surgery  . Ulcer of hard palate   . Vertigo    Past Surgical History:  Procedure Laterality Date  . ABDOMINAL HYSTERECTOMY  1968  . APPENDECTOMY  1940's  . COLONOSCOPY    . FOOT SURGERY Left   . HARDWARE REMOVAL Right 02/16/2016   Procedure: HARDWARE REMOVAL RT HIP;  Surgeon: Renette Butters, MD;  Location: [facility];  Service: Orthopedics;  Laterality: Right;  . HIP ARTHROPLASTY Right 02/16/2016   Procedure: ARTHROPLASTY BIPOLAR HIP (HEMIARTHROPLASTY);  Surgeon: Renette Butters, MD;  Location: [place];  Service: Orthopedics;  Laterality: Right;  . INTRAMEDULLARY (IM) NAIL INTERTROCHANTERIC Right 09/11/2015   Procedure: INTRAMEDULLARY (IM) NAIL INTERTROCHANTRIC;  Surgeon: Renette Butters, MD;  Location: [facility];  Service: Orthopedics;  Laterality: Right;  . KNEE SURGERY      No Known Allergies  Allergies as of 03/05/2020   No Known Allergies     Medication List  Accurate as of March 05, 2020 11:59 PM. If you have any questions, ask your nurse or doctor.        acetaminophen 500 MG tablet Commonly known as: TYLENOL Take 500 mg by mouth every 6 (six) hours as needed.   amLODipine 5 MG tablet Commonly known as: NORVASC Take 5 mg by mouth daily.   atorvastatin 10 MG tablet Commonly known as: LIPITOR Take 10 mg by mouth daily.   Biotin 10000 MCG Tabs Take 10,000 mcg by mouth daily.   calcium carbonate 1500 (600 Ca) MG Tabs tablet Commonly known as:  OSCAL Take 600 mg of elemental calcium by mouth 2 (two) times daily with a meal.   docusate sodium 100 MG capsule Commonly known as: COLACE Take 100 mg by mouth 2 (two) times daily.   EPINEPHrine 0.3 mg/0.3 mL Soaj injection Commonly known as: EPI-PEN Inject 0.3 mLs (0.3 mg total) into the muscle once as needed (anaphylaxis/allergic reaction).   lactose free nutrition Liqd Take 237 mLs by mouth. Once a day at breakfast   meclizine 12.5 MG tablet Commonly known as: ANTIVERT Take 12.5 mg by mouth every 12 (twelve) hours.   melatonin 3 MG Tabs tablet Take 3 mg by mouth at bedtime as needed (sleep).   memantine 10 MG tablet Commonly known as: NAMENDA Take 10 mg by mouth 2 (two) times daily.   polyethylene glycol 17 g packet Commonly known as: MIRALAX / GLYCOLAX Take 17 g by mouth every other day.   Therems-M Tabs Take 1 tablet by mouth daily.   PreserVision AREDS Tabs Take 2 tablets by mouth daily.   valACYclovir 1000 MG tablet Commonly known as: VALTREX Take 1,000 mg by mouth 3 (three) times daily.   Vitamin D3 50 MCG (2000 UT) Tabs Take 2,000 Units by mouth daily.       Review of Systems  Constitutional: Positive for activity change, appetite change and fatigue. Negative for fever.  HENT: Positive for hearing loss. Negative for congestion and voice change.        Hears better left ear  Respiratory: Negative for cough and shortness of breath.   Cardiovascular: Negative for chest pain, palpitations and leg swelling.  Gastrointestinal: Negative for abdominal distention, abdominal pain and constipation.  Genitourinary: Negative for difficulty urinating, dysuria and urgency.  Musculoskeletal: Positive for gait problem.  Skin: Positive for rash. Negative for color change.       Left mid back  Neurological: Negative for speech difficulty, weakness, light-headedness and headaches.       Memory lapses.   Psychiatric/Behavioral: Negative for agitation, behavioral  problems and sleep disturbance.    Immunization History  Administered Date(s) Administered  . Influenza Whole 08/25/2018  . Influenza, High Dose Seasonal PF 08/24/2019  . Influenza-Unspecified 09/07/2016, 09/12/2017  . Moderna SARS-COVID-2 Vaccination 11/23/2019, 12/21/2019  . PPD Test 02/18/2016  . Pneumococcal Conjugate-13 09/18/2017  . Tdap 10/07/2012   Pertinent  Health Maintenance Due  Topic Date Due  . PNA vac Low Risk Adult (2 of 2 - PPSV23) 09/18/2018  . INFLUENZA VACCINE  06/21/2020  . DEXA SCAN  Completed   Fall Risk  08/17/2018 08/03/2017 10/24/2014  Falls in the past year? No No Yes  Number falls in past yr: - - 1  Injury with Fall? - - Yes  Risk Factor Category  - - High Fall Risk  Risk for fall due to : - - History of fall(s)   Functional Status Survey:    Vitals:   03/05/20  1614  BP: 100/60  Pulse: 72  Resp: 18  Temp: (!) 97.1 F (36.2 C)  SpO2: 97%  Weight: 123 lb 9.6 oz (56.1 kg)  Height: '5\' 5"'$  (1.651 m)   Body mass index is 20.57 kg/m. Physical Exam Vitals and nursing note reviewed.  Constitutional:      Appearance: She is ill-appearing.  HENT:     Head: Normocephalic and atraumatic.     Nose: Nose normal.     Mouth/Throat:     Mouth: Mucous membranes are moist.  Eyes:     Extraocular Movements: Extraocular movements intact.     Conjunctiva/sclera: Conjunctivae normal.     Pupils: Pupils are equal, round, and reactive to light.  Cardiovascular:     Rate and Rhythm: Normal rate and regular rhythm.     Heart sounds: No murmur.  Pulmonary:     Effort: Pulmonary effort is normal.     Breath sounds: No rales.  Abdominal:     General: Bowel sounds are normal. There is no distension.     Palpations: Abdomen is soft.     Tenderness: There is no abdominal tenderness.  Musculoskeletal:     Cervical back: Normal range of motion and neck supple.     Right lower leg: No edema.     Left lower leg: No edema.  Skin:    General: Skin is warm and  dry.     Findings: Rash present.     Comments: Left mid back  Neurological:     General: No focal deficit present.     Mental Status: She is alert. Mental status is at baseline.     Motor: No weakness.     Coordination: Coordination normal.     Gait: Gait abnormal.     Comments: Oriented to person, place.   Psychiatric:        Mood and Affect: Mood normal.        Behavior: Behavior normal.        Thought Content: Thought content normal.        Judgment: Judgment normal.     Labs reviewed: Recent Labs    06/25/19 0000  NA 132*  K 4.3  CL 101  CO2 24  BUN 10  CREATININE 0.6  CALCIUM 9.7   Recent Labs    06/25/19 0000  AST 20  ALT 14  ALKPHOS 42  PROT 6.7  ALBUMIN 4.1   Recent Labs    06/25/19 0000  WBC 5.4  NEUTROABS 3,483  HGB 12.4  HCT 35*  PLT 300   Lab Results  Component Value Date   TSH 2.07 06/25/2019   Lab Results  Component Value Date   HGBA1C 5.4 03/14/2018   Lab Results  Component Value Date   CHOL 179 03/14/2018   HDL 70 03/14/2018   LDLCALC 95 03/14/2018   TRIG 69 03/14/2018   CHOLHDL 2.6 03/14/2018    Significant Diagnostic Results in last 30 days:  No results found.  Assessment/Plan  Hyponatremia 01/04/18 Na 133, 06/25/19 Na 132, 03/05/20 Na 126, K 4.0, Bun 12, creat 0.60, eGFR 79, wbc 5.8, Hgb 13.5, plt 254, neutrophils 76.5 Encourage oral fluid intake, repeat BMP in am.   Essential hypertension Blood pressure is controlled, continue Amlodipine.   Memory deficit Functioning well in [place] [facility].   Shingles Started Valcyclovir x 7 days since 03/03/20   Family/ staff Communication: plan of care reviewed with the patient and charge nurse.   Labs/tests ordered:  BMP  Time spend 40 minutes.

## 2020-03-05 NOTE — Assessment & Plan Note (Signed)
Functioning well in [facility] 

## 2020-03-06 ENCOUNTER — Encounter: Payer: Self-pay | Admitting: Nurse Practitioner

## 2020-03-06 DIAGNOSIS — R413 Other amnesia: Secondary | ICD-10-CM | POA: Diagnosis not present

## 2020-03-06 DIAGNOSIS — D509 Iron deficiency anemia, unspecified: Secondary | ICD-10-CM | POA: Diagnosis not present

## 2020-03-06 DIAGNOSIS — D649 Anemia, unspecified: Secondary | ICD-10-CM | POA: Diagnosis not present

## 2020-03-06 DIAGNOSIS — I10 Essential (primary) hypertension: Secondary | ICD-10-CM | POA: Diagnosis not present

## 2020-03-06 LAB — BASIC METABOLIC PANEL
BUN: 18 (ref 4–21)
CO2: 20 (ref 13–22)
Chloride: 98 — AB (ref 99–108)
Creatinine: 1.2 — AB (ref 0.5–1.1)
Glucose: 152
Potassium: 4 (ref 3.4–5.3)
Sodium: 126 — AB (ref 137–147)

## 2020-03-06 LAB — COMPREHENSIVE METABOLIC PANEL
Calcium: 9.7 (ref 8.7–10.7)
GFR calc Af Amer: 44
GFR calc non Af Amer: 38

## 2020-03-09 ENCOUNTER — Non-Acute Institutional Stay: Payer: Medicare PPO | Admitting: Nurse Practitioner

## 2020-03-09 DIAGNOSIS — N179 Acute kidney failure, unspecified: Secondary | ICD-10-CM

## 2020-03-09 DIAGNOSIS — B029 Zoster without complications: Secondary | ICD-10-CM

## 2020-03-09 DIAGNOSIS — I10 Essential (primary) hypertension: Secondary | ICD-10-CM | POA: Diagnosis not present

## 2020-03-09 DIAGNOSIS — R413 Other amnesia: Secondary | ICD-10-CM

## 2020-03-09 DIAGNOSIS — D649 Anemia, unspecified: Secondary | ICD-10-CM | POA: Diagnosis not present

## 2020-03-09 DIAGNOSIS — E871 Hypo-osmolality and hyponatremia: Secondary | ICD-10-CM | POA: Diagnosis not present

## 2020-03-09 DIAGNOSIS — R531 Weakness: Secondary | ICD-10-CM

## 2020-03-09 LAB — BASIC METABOLIC PANEL
BUN: 18 (ref 4–21)
CO2: 22 (ref 13–22)
Chloride: 101 (ref 99–108)
Creatinine: 0.9 (ref 0.5–1.1)
Glucose: 95
Potassium: 4.4 (ref 3.4–5.3)
Sodium: 131 — AB (ref 137–147)

## 2020-03-09 LAB — COMPREHENSIVE METABOLIC PANEL
Calcium: 10 (ref 8.7–10.7)
GFR calc Af Amer: 64
GFR calc non Af Amer: 55

## 2020-03-09 NOTE — Assessment & Plan Note (Addendum)
03/05/20 Na 126, K 4.0, Bun 12, creat 0.60, eGFR 79 03/06/20 Na 126, K 4.0, Bun 18, creat 1.23, eGFR 38 03/09/20 Na 131, K 4.4, Bun 18, creat 0.90, improving, continue encouraging oral fluid intake.

## 2020-03-09 NOTE — Assessment & Plan Note (Addendum)
03/05/20 Na 126, K 4.0, Bun 12, creat 0.60, eGFR 79 03/06/20 Na 126, K 4.0, Bun 18, creat 1.23, eGFR 38 03/09/20 Na 131, K 4.4, Bun 18, creat 0.90, continue encourage oral intake, improving.

## 2020-03-09 NOTE — Progress Notes (Signed)
Location:    [facility] Room Number: 962 Place of Service:  ALF (13) Provider: Marlana Latus NP  Taylah Dubiel X, NP  Patient Care Team: Constance Whittle X, NP as PCP - General (Internal Medicine) Nahser, Wonda Cheng, MD as PCP - Cardiology (Cardiology) Melina Schools, MD as Consulting Physician (Orthopedic Surgery) Leonie Ohm Dentler, MD as Consulting Physician (Cardiology) Jakorian Marengo X, NP as Nurse Practitioner (Internal Medicine)  Extended Emergency Contact Information Primary Emergency Contact: [name],Aliza Address: [place] [address] Johnnette Litter of [place] Phone: [phone] Mobile Phone: [phone] Relation: Niece Secondary Emergency Contact: [name],[name]  [place] Mobile Phone: [phone] Relation: Niece  Code Status: DNR Goals of care: Advanced Directive information Advanced Directives 03/12/2020  Does Patient Have a Medical Advance Directive? Yes  Type of Advance Directive Out of facility DNR (pink MOST or yellow form)  Does patient want to make changes to medical advance directive? No - Patient declined  Copy of [place] in Chart? -  Would patient like information on creating a medical advance directive? -  Pre-existing out of facility DNR order (yellow form or pink MOST form) Yellow form placed in chart (order not valid for inpatient use);Pink MOST form placed in chart (order not valid for inpatient use)     Chief Complaint  Patient presents with  . Acute Visit    Acute renal injury    HPI:  Pt is a 86 y.o. female seen today for an acute visit for generalized weakness, decreased oral intake, decreased eGFR, persisted serum Na 126 03/05/20 and 03/06/20. The left  mid back shingle rash is improving. The patient resides in [place] [facility] for safety, care assistance, on Memantine '10mg'$  bid for memory. HTN, blood pressure is controlled on Amlodipine '5mg'$  qd.    Past Medical History:  Diagnosis  Date  . Arteriosclerotic cardiovascular disease   . Bee sting reaction 08/23/2016   Tongue swelling and difficulty swallowing /notes 08/23/2016  . Constipation   . Degenerative joint disease of right hip 10/15/14  . Fracture of multiple pubic rami (HCC) 10/15/14   Right inferior and superior pubic rami  . HOH (hard of hearing)    Wears bilateral hearing aids  . Hyperlipidemia   . Hypertension   . Macular degeneration   . Memory deficit   . Osteoporosis   . Pain in right foot   . Palpitations   . Paroxysmal atrial fibrillation (HCC)   . Thyroid nodule    Status post surgery  . Ulcer of hard palate   . Vertigo    Past Surgical History:  Procedure Laterality Date  . ABDOMINAL HYSTERECTOMY  1968  . APPENDECTOMY  1940's  . COLONOSCOPY    . FOOT SURGERY Left   . HARDWARE REMOVAL Right 02/16/2016   Procedure: HARDWARE REMOVAL RT HIP;  Surgeon: Renette Butters, MD;  Location: [facility];  Service: Orthopedics;  Laterality: Right;  . HIP ARTHROPLASTY Right 02/16/2016   Procedure: ARTHROPLASTY BIPOLAR HIP (HEMIARTHROPLASTY);  Surgeon: Renette Butters, MD;  Location: [facility];  Service: Orthopedics;  Laterality: Right;  . INTRAMEDULLARY (IM) NAIL INTERTROCHANTERIC Right 09/11/2015   Procedure: INTRAMEDULLARY (IM) NAIL INTERTROCHANTRIC;  Surgeon: Renette Butters, MD;  Location: [facility];  Service: Orthopedics;  Laterality: Right;  . KNEE SURGERY      No Known Allergies  Allergies as of 03/09/2020   No Known Allergies  Medication List       Accurate as of March 09, 2020 11:59 PM. If you have any questions, ask your nurse or doctor.        acetaminophen 500 MG tablet Commonly known as: TYLENOL Take 500 mg by mouth every 6 (six) hours as needed.   amLODipine 5 MG tablet Commonly known as: NORVASC Take 5 mg by mouth daily.   atorvastatin 10 MG tablet Commonly known as: LIPITOR Take 10 mg by mouth daily.   Biotin 10000 MCG Tabs Take 10,000 mcg by mouth daily.   calcium  carbonate 1500 (600 Ca) MG Tabs tablet Commonly known as: OSCAL Take 600 mg of elemental calcium by mouth 2 (two) times daily with a meal.   docusate sodium 100 MG capsule Commonly known as: COLACE Take 100 mg by mouth 2 (two) times daily.   EPINEPHrine 0.3 mg/0.3 mL Soaj injection Commonly known as: EPI-PEN Inject 0.3 mLs (0.3 mg total) into the muscle once as needed (anaphylaxis/allergic reaction).   lactose free nutrition Liqd Take 237 mLs by mouth. Once a day at breakfast   magnesium hydroxide 400 MG/5ML suspension Commonly known as: MILK OF MAGNESIA Take by mouth daily as needed for mild constipation.   meclizine 12.5 MG tablet Commonly known as: ANTIVERT Take 12.5 mg by mouth every 12 (twelve) hours.   melatonin 3 MG Tabs tablet Take 3 mg by mouth at bedtime as needed (sleep).   memantine 10 MG tablet Commonly known as: NAMENDA Take 10 mg by mouth 2 (two) times daily.   polyethylene glycol 17 g packet Commonly known as: MIRALAX / GLYCOLAX Take 17 g by mouth every other day.   Therems-M Tabs Take 1 tablet by mouth daily.   PreserVision AREDS Tabs Take 2 tablets by mouth daily.   valACYclovir 1000 MG tablet Commonly known as: VALTREX Take 1,000 mg by mouth 3 (three) times daily.   Vitamin D3 50 MCG (2000 UT) Tabs Take 2,000 Units by mouth daily.       Review of Systems  Constitutional: Positive for activity change, appetite change and fatigue. Negative for fever.  HENT: Positive for hearing loss. Negative for congestion and voice change.        Hears better left ear  Respiratory: Negative for cough and shortness of breath.   Cardiovascular: Negative for leg swelling.  Gastrointestinal: Negative for abdominal distention, abdominal pain and constipation.  Genitourinary: Negative for difficulty urinating, dysuria and urgency.  Musculoskeletal: Positive for gait problem.  Skin: Positive for rash. Negative for color change.       Left mid back   Neurological: Negative for dizziness, speech difficulty and weakness.       Memory lapses.   Psychiatric/Behavioral: Positive for confusion. Negative for agitation, behavioral problems and sleep disturbance.    Immunization History  Administered Date(s) Administered  . Influenza Whole 08/25/2018  . Influenza, High Dose Seasonal PF 08/24/2019  . Influenza-Unspecified 09/07/2016, 09/12/2017  . Moderna SARS-COVID-2 Vaccination 11/23/2019, 12/21/2019  . PPD Test 02/18/2016  . Pneumococcal Conjugate-13 09/18/2017  . Tdap 10/07/2012   Pertinent  Health Maintenance Due  Topic Date Due  . PNA vac Low Risk Adult (2 of 2 - PPSV23) 09/18/2018  . INFLUENZA VACCINE  06/21/2020  . DEXA SCAN  Completed   Fall Risk  08/17/2018 08/03/2017 10/24/2014  Falls in the past year? No No Yes  Number falls in past yr: - - 1  Injury with Fall? - - Yes  Risk Factor Category  - -  High Fall Risk  Risk for fall due to : - - History of fall(s)   Functional Status Survey:    Vitals:   03/10/20 1620  BP: 136/72  Pulse: 84  Resp: 20  Temp: 98 F (36.7 C)  SpO2: 95%  Weight: 123 lb 9.6 oz (56.1 kg)  Height: '5\' 5"'$  (1.651 m)   Body mass index is 20.57 kg/m. Physical Exam Vitals and nursing note reviewed.  Constitutional:      Comments: Frail, exhausted appearance.   HENT:     Head: Normocephalic and atraumatic.     Nose: Nose normal.     Mouth/Throat:     Mouth: Mucous membranes are moist.  Eyes:     Extraocular Movements: Extraocular movements intact.     Conjunctiva/sclera: Conjunctivae normal.     Pupils: Pupils are equal, round, and reactive to light.  Cardiovascular:     Rate and Rhythm: Normal rate and regular rhythm.     Heart sounds: No murmur.  Pulmonary:     Effort: Pulmonary effort is normal.     Breath sounds: No rales.  Abdominal:     General: Bowel sounds are normal. There is no distension.     Palpations: Abdomen is soft.     Tenderness: There is no abdominal tenderness.   Musculoskeletal:     Cervical back: Normal range of motion and neck supple.     Right lower leg: No edema.     Left lower leg: No edema.  Skin:    General: Skin is warm and dry.     Findings: Rash present.     Comments: Left mid back clustered rash  Neurological:     General: No focal deficit present.     Mental Status: She is alert. Mental status is at baseline.     Gait: Gait abnormal.     Comments: Oriented to person, place.   Psychiatric:        Mood and Affect: Mood normal.        Behavior: Behavior normal.        Thought Content: Thought content normal.     Labs reviewed: Recent Labs    03/05/20 0000 03/06/20 0000 03/09/20 0000  NA 126* 126* 131*  K 4.0 4.0 4.4  CL 97* 98* 101  CO2 '22 20 22  '$ BUN '12 18 18  '$ CREATININE 0.6 1.2* 0.9  CALCIUM 9.2 9.7 10.0   Recent Labs    06/25/19 0000 03/05/20 0000  AST 20 20  ALT 14 13  ALKPHOS 42 54  PROT 6.7  --   ALBUMIN 4.1 4.0   Recent Labs    06/25/19 0000 03/05/20 0000  WBC 5.4 5.8  NEUTROABS 3,483 818  HGB 12.4 13.5  HCT 35* 39  PLT 300 254   Lab Results  Component Value Date   TSH 2.07 06/25/2019   Lab Results  Component Value Date   HGBA1C 5.4 03/14/2018   Lab Results  Component Value Date   CHOL 183 03/05/2020   HDL 70 03/05/2020   LDLCALC 94 03/05/2020   TRIG 97 03/05/2020   CHOLHDL 2.6 03/14/2018    Significant Diagnostic Results in last 30 days:  No results found.  Assessment/Plan: Acute kidney injury ([place]) 03/05/20 Na 126, K 4.0, Bun 12, creat 0.60, eGFR 79 03/06/20 Na 126, K 4.0, Bun 18, creat 1.23, eGFR 38 03/09/20 Na 131, K 4.4, Bun 18, creat 0.90, continue encourage oral intake, improving.   Hyponatremia 03/05/20 Na 126,  K 4.0, Bun 12, creat 0.60, eGFR 79 03/06/20 Na 126, K 4.0, Bun 18, creat 1.23, eGFR 38 03/09/20 Na 131, K 4.4, Bun 18, creat 0.90, improving, continue encouraging oral fluid intake.   Shingles Right mid back, onset 03/03/20, resolving.   Generalized weakness  Onset of shingles 03/03/20, decreased oral intake are contributory, continue supportive measures.   Essential hypertension Blood pressure is controlled, continue Amlodipine.   Memory deficit Continue [facility] for safety, care assistance, continue Memantine for memory.     Family/ staff Communication: plan of care reviewed with the patient and charge nurse.   Labs/tests ordered:  BMP  Time spend 40 minutes.

## 2020-03-09 NOTE — Assessment & Plan Note (Signed)
Continue [facility] for safety, care assistance, continue Memantine for memory.  

## 2020-03-09 NOTE — Assessment & Plan Note (Signed)
Blood pressure is controlled, continue Amlodipine.  

## 2020-03-09 NOTE — Assessment & Plan Note (Signed)
Right mid back, onset 03/03/20, resolving.

## 2020-03-09 NOTE — Assessment & Plan Note (Signed)
Onset of shingles 03/03/20, decreased oral intake are contributory, continue supportive measures.

## 2020-03-10 ENCOUNTER — Encounter: Payer: Self-pay | Admitting: Nurse Practitioner

## 2020-03-12 ENCOUNTER — Non-Acute Institutional Stay: Payer: Medicare PPO | Admitting: Internal Medicine

## 2020-03-12 ENCOUNTER — Encounter: Payer: Self-pay | Admitting: Internal Medicine

## 2020-03-12 ENCOUNTER — Encounter: Payer: Self-pay | Admitting: Nurse Practitioner

## 2020-03-12 DIAGNOSIS — B029 Zoster without complications: Secondary | ICD-10-CM | POA: Diagnosis not present

## 2020-03-12 DIAGNOSIS — R531 Weakness: Secondary | ICD-10-CM

## 2020-03-12 DIAGNOSIS — R413 Other amnesia: Secondary | ICD-10-CM

## 2020-03-12 DIAGNOSIS — E871 Hypo-osmolality and hyponatremia: Secondary | ICD-10-CM

## 2020-03-12 NOTE — Progress Notes (Signed)
Location:  [facility] Room Number: 822-A Place of Service:  ALF [phone]) Provider:  Einar Crow, MD  Patient Care Team: Mast, Man X, NP as PCP - General (Internal Medicine) Nahser, Deloris Ping, MD as PCP - Cardiology (Cardiology) Venita Lick, MD as Consulting Physician (Orthopedic Surgery) Marykay Lex, MD as Consulting Physician (Cardiology) Mast, Man X, NP as Nurse Practitioner (Internal Medicine)  Extended Emergency Contact Information Primary Emergency Contact: [name],Iyanah Address: [address]           [place], [place] [address] Darden Amber of [place] Home Phone: [phone] Mobile Phone: [phone] Relation: Niece Secondary Emergency Contact: [name],[name]  [place] Mobile Phone: [phone] Relation: Niece  Code Status:  DNR Goals of care: Advanced Directive information Advanced Directives 03/12/2020  Does Patient Have a Medical Advance Directive? Yes  Type of Advance Directive Out of facility DNR (pink MOST or yellow form)  Does patient want to make changes to medical advance directive? No - Patient declined  Copy of Healthcare Power of Attorney in Chart? -  Would patient like information on creating a medical advance directive? -  Pre-existing out of facility DNR order (yellow form or pink MOST form) Yellow form placed in chart (order not valid for inpatient use)     Chief Complaint  Patient presents with  . Acute Visit    HPI:  Pt is a 86 y.o. female seen today for an acute visit for Shingles rash and Possible discontinue isolation  She has h/o Hypertension, Hyperlipidemia, Cognitive Impairment, PAF Not Clear Documentation Only on ASpirin,BPPV, Vit D def, Hyponatremia, h/o PVC  [place]ives in [place].  She had shingles rash with Pain in her back. Has Finished Vacyclovir for 7 days Also had weakness confusion Hyponatremia But now doing well Nurses and Patient want to come out oif isolation so she can walk  She is doing well.  Eating No new Complains. Just some confused and Bored in her room Pain is better  Past Medical History:  Diagnosis Date  . Arteriosclerotic cardiovascular disease   . Bee sting reaction 08/23/2016   Tongue swelling and difficulty swallowing /notes 08/23/2016  . Constipation   . Degenerative joint disease of right hip 10/15/14  . Fracture of multiple pubic rami (HCC) 10/15/14   Right inferior and superior pubic rami  . HOH (hard of hearing)    Wears bilateral hearing aids  . Hyperlipidemia   . Hypertension   . Macular degeneration   . Memory deficit   . Osteoporosis   . Pain in right foot   . Palpitations   . Paroxysmal atrial fibrillation (HCC)   . Thyroid nodule    Status post surgery  . Ulcer of hard palate   . Vertigo    Past Surgical History:  Procedure Laterality Date  . ABDOMINAL HYSTERECTOMY  1968  . APPENDECTOMY  1940's  . COLONOSCOPY    . FOOT SURGERY Left   . HARDWARE REMOVAL Right 02/16/2016   Procedure: HARDWARE REMOVAL RT HIP;  Surgeon: Sheral Apley, MD;  Location: [facility] OR;  Service: Orthopedics;  Laterality: Right;  . HIP ARTHROPLASTY Right 02/16/2016   Procedure: ARTHROPLASTY BIPOLAR HIP (HEMIARTHROPLASTY);  Surgeon: Sheral Apley, MD;  Location: [facility] OR;  Service: Orthopedics;  Laterality: Right;  . INTRAMEDULLARY (IM) NAIL INTERTROCHANTERIC Right 09/11/2015   Procedure: INTRAMEDULLARY (IM) NAIL INTERTROCHANTRIC;  Surgeon: Sheral Apley, MD;  Location: [facility] OR;  Service: Orthopedics;  Laterality: Right;  . KNEE SURGERY  No Known Allergies  Outpatient Encounter Medications as of 03/12/2020  Medication Sig  . acetaminophen (TYLENOL) 500 MG tablet Take 500 mg by mouth every 6 (six) hours as needed.  Marland Kitchen amLODipine (NORVASC) 5 MG tablet Take 5 mg by mouth daily.  Marland Kitchen atorvastatin (LIPITOR) 10 MG tablet Take 10 mg by mouth daily.  . Biotin 09323 MCG TABS Take 10,000 mcg by mouth daily.   . calcium carbonate (OSCAL) 1500 (600 Ca) MG TABS tablet Take 600 mg  of elemental calcium by mouth 2 (two) times daily with a meal.  . Cholecalciferol (VITAMIN D3) 2000 units TABS Take 2,000 Units by mouth daily.  Marland Kitchen docusate sodium (COLACE) 100 MG capsule Take 100 mg by mouth 2 (two) times daily.   Marland Kitchen EPINEPHrine 0.3 mg/0.3 mL IJ SOAJ injection Inject 0.3 mLs (0.3 mg total) into the muscle once as needed (anaphylaxis/allergic reaction).  . lactose free nutrition (BOOST) LIQD Take 237 mLs by mouth. Once a day at breakfast  . magnesium hydroxide (MILK OF MAGNESIA) 400 MG/5ML suspension Take by mouth daily as needed for mild constipation.  . meclizine (ANTIVERT) 12.5 MG tablet Take 12.5 mg by mouth every 12 (twelve) hours.   . Melatonin 3 MG TABS Take 3 mg by mouth at bedtime as needed (sleep).  . memantine (NAMENDA) 10 MG tablet Take 10 mg by mouth 2 (two) times daily.   . Multiple Vitamins-Minerals (PRESERVISION AREDS) TABS Take 2 tablets by mouth daily.  . Multiple Vitamins-Minerals (THEREMS-M) TABS Take 1 tablet by mouth daily.  . polyethylene glycol (MIRALAX / GLYCOLAX) packet Take 17 g by mouth every other day.   No facility-administered encounter medications on file as of 03/12/2020.    Review of Systems Review of Systems  Constitutional: Negative for activity change, appetite change, chills, diaphoresis, fatigue and fever.  HENT: Negative for mouth sores, postnasal drip, rhinorrhea, sinus pain and sore throat.   Respiratory: Negative for apnea, cough, chest tightness, shortness of breath and wheezing.   Cardiovascular: Negative for chest pain, palpitations and leg swelling.  Gastrointestinal: Negative for abdominal distention, abdominal pain, constipation, diarrhea, nausea and vomiting.  Genitourinary: Negative for dysuria and frequency.  Musculoskeletal: Negative for arthralgias, joint swelling and myalgias.  Skin: Negative for rash.  Neurological: Negative for dizziness, syncope, weakness, light-headedness and numbness.  Psychiatric/Behavioral:  Negative for behavioral problems, confusion and sleep disturbance.    Immunization History  Administered Date(s) Administered  . Influenza Whole 08/25/2018  . Influenza, High Dose Seasonal PF 08/24/2019  . Influenza-Unspecified 09/07/2016, 09/12/2017  . Moderna SARS-COVID-2 Vaccination 11/23/2019, 12/21/2019  . PPD Test 02/18/2016  . Pneumococcal Conjugate-13 09/18/2017  . Tdap 10/07/2012   Pertinent  Health Maintenance Due  Topic Date Due  . PNA vac Low Risk Adult (2 of 2 - PPSV23) 09/18/2018  . INFLUENZA VACCINE  06/21/2020  . DEXA SCAN  Completed   Fall Risk  08/17/2018 08/03/2017 10/24/2014  Falls in the past year? No No Yes  Number falls in past yr: - - 1  Injury with Fall? - - Yes  Risk Factor Category  - - High Fall Risk  Risk for fall due to : - - History of fall(s)   Functional Status Survey:    Vitals:   03/12/20 1529  BP: 136/72  Pulse: 84  Resp: 20  Temp: (!) 97.3 F (36.3 C)  TempSrc: Oral  SpO2: 96%  Weight: 123 lb 9.6 oz (56.1 kg)  Height: 5\' 5"  (1.651 m)   Body mass index is  20.57 kg/m. Physical Exam  Constitutional: Oriented to person, place, and time. Well-developed and well-nourished.  HENT:  Head: Normocephalic.  Mouth/Throat: Oropharynx is clear and moist.  Eyes: Pupils are equal, round, and reactive to light.  Neck: Neck supple.  Cardiovascular: Normal rate and normal heart sounds.  No murmur heard. Pulmonary/Chest: Effort normal and breath sounds normal. No respiratory distress. No wheezes. She has no rales.  Abdominal: Soft. Bowel sounds are normal. No distension. There is no tenderness. There is no rebound.  Musculoskeletal: No edema.  Lymphadenopathy: none Neurological: Alert and oriented to person, place, and time.  Skin: Skin is warm and dry. Has healing Vesicular Rash in her Back Psychiatric: Normal mood and affect. Behavior is normal. Thought content normal.    Labs reviewed: Recent Labs    03/05/20 0000 03/06/20 0000  03/09/20 0000  NA 126* 126* 131*  K 4.0 4.0 4.4  CL 97* 98* 101  CO2 22 20 22   BUN 12 18 18   CREATININE 0.6 1.2* 0.9  CALCIUM 9.2 9.7 10.0   Recent Labs    06/25/19 0000 03/05/20 0000  AST 20 20  ALT 14 13  ALKPHOS 42 54  PROT 6.7  --   ALBUMIN 4.1 4.0   Recent Labs    06/25/19 0000 03/05/20 0000  WBC 5.4 5.8  NEUTROABS 3,483 818  HGB 12.4 13.5  HCT 35* 39  PLT 300 254   Lab Results  Component Value Date   TSH 2.07 06/25/2019   Lab Results  Component Value Date   HGBA1C 5.4 03/14/2018   Lab Results  Component Value Date   CHOL 183 03/05/2020   HDL 70 03/05/2020   LDLCALC 94 03/05/2020   TRIG 97 03/05/2020   CHOLHDL 2.6 03/14/2018    Significant Diagnostic Results in last 30 days:  No results found.  Assessment/Plan Shingles with Resolving rash Finished Vancyclovir Will Discontinue Isolation after this weekend D/W Nurse and Patient  Hyponatremia Due to Dehydration Eating well now Repeat BMP in 1 week   Other Issues Memory deficit Continue to be stable on Namenda No Behavior issues   PAF (paroxysmal atrial fibrillation) (HCC) Not definite diagnosis Only on aspirin  Age-related osteoporosis Has been on reclast before. Last Tscore was 2.1 Would continue Vit D and Calcium  H/o BPPV Has not had any recent issues  Essential Hypertension Has h/o Labile Hypertension Staying stable on Norvasc  Hyperlipidemia On Lipitor   Family/ staff Communication:   Labs/tests ordered:  BMP in 1 week  Total time spent in this patient care encounter was  25_  minutes; greater than 50% of the visit spent counseling patient and staff, reviewing records , Labs and coordinating care for problems addressed at this encounter.

## 2020-03-17 DIAGNOSIS — I10 Essential (primary) hypertension: Secondary | ICD-10-CM | POA: Diagnosis not present

## 2020-03-17 DIAGNOSIS — D509 Iron deficiency anemia, unspecified: Secondary | ICD-10-CM | POA: Diagnosis not present

## 2020-03-17 LAB — BASIC METABOLIC PANEL
BUN: 13 (ref 4–21)
CO2: 24 — AB (ref 13–22)
Chloride: 100 (ref 99–108)
Creatinine: 0.7 (ref 0.5–1.1)
Glucose: 91
Potassium: 4.1 (ref 3.4–5.3)
Sodium: 131 — AB (ref 137–147)

## 2020-03-17 LAB — COMPREHENSIVE METABOLIC PANEL: Calcium: 10.2 (ref 8.7–10.7)

## 2020-04-14 ENCOUNTER — Encounter (INDEPENDENT_AMBULATORY_CARE_PROVIDER_SITE_OTHER): Payer: Self-pay | Admitting: Ophthalmology

## 2020-04-14 ENCOUNTER — Non-Acute Institutional Stay: Payer: Medicare PPO | Admitting: Internal Medicine

## 2020-04-14 DIAGNOSIS — I10 Essential (primary) hypertension: Secondary | ICD-10-CM | POA: Diagnosis not present

## 2020-04-14 DIAGNOSIS — R413 Other amnesia: Secondary | ICD-10-CM

## 2020-04-14 DIAGNOSIS — B029 Zoster without complications: Secondary | ICD-10-CM | POA: Diagnosis not present

## 2020-04-14 DIAGNOSIS — M810 Age-related osteoporosis without current pathological fracture: Secondary | ICD-10-CM

## 2020-04-14 DIAGNOSIS — E782 Mixed hyperlipidemia: Secondary | ICD-10-CM

## 2020-04-14 DIAGNOSIS — H8110 Benign paroxysmal vertigo, unspecified ear: Secondary | ICD-10-CM

## 2020-04-14 DIAGNOSIS — I480 Paroxysmal atrial fibrillation: Secondary | ICD-10-CM

## 2020-04-14 NOTE — Progress Notes (Signed)
Location:   [facility] Room Number: 822 Place of Service:  ALF [phone]) Provider:  Einar Crow MD   Mast, Man X, NP  Patient Care Team: Mast, Man X, NP as PCP - General (Internal Medicine) Nahser, Deloris Ping, MD as PCP - Cardiology (Cardiology) Venita Lick, MD as Consulting Physician (Orthopedic Surgery) Marykay Lex, MD as Consulting Physician (Cardiology) Mast, Man X, NP as Nurse Practitioner (Internal Medicine)  Extended Emergency Contact Information Primary Emergency Contact: [name],Shaylin Address: [address]           [place], [place] [address] [place] Home Phone: [phone] Mobile Phone: [phone] Relation: Niece Secondary Emergency Contact: [name],[name]  [place] Mobile Phone: [phone] Relation: Niece  Code Status:  DNR Goals of care: Advanced Directive information Advanced Directives 04/14/2020  Does Patient Have a Medical Advance Directive? Yes  Type of Advance Directive Out of facility DNR (pink MOST or yellow form)  Does patient want to make changes to medical advance directive? No - Patient declined  Copy of Healthcare Power of Attorney in Chart? -  Would patient like information on creating a medical advance directive? -  Pre-existing out of facility DNR order (yellow form or pink MOST form) Yellow form placed in chart (order not valid for inpatient use);Pink MOST form placed in chart (order not valid for inpatient use)     Chief Complaint  Patient presents with  . Medical Management of Chronic Issues    HPI:  Pt is a 86 y.o. female seen today for medical management of chronic diseases.    She has h/o Hypertension, Hyperlipidemia, Cognitive Impairment, PAF Not Clear Documentation Only on ASpirin,BPPV, Vit D def, Hyponatremia, h/o PVC  Doing well. Just recovered from Shingles. Walks with the walker. No Falls. Weight stable No Acute complains today. No New nursing issue Past Medical  History:  Diagnosis Date  . Arteriosclerotic cardiovascular disease   . Bee sting reaction 08/23/2016   Tongue swelling and difficulty swallowing /notes 08/23/2016  . Constipation   . Degenerative joint disease of right hip 10/15/14  . Fracture of multiple pubic rami (HCC) 10/15/14   Right inferior and superior pubic rami  . HOH (hard of hearing)    Wears bilateral hearing aids  . Hyperlipidemia   . Hypertension   . Macular degeneration   . Memory deficit   . Osteoporosis   . Pain in right foot   . Palpitations   . Paroxysmal atrial fibrillation (HCC)   . Thyroid nodule    Status post surgery  . Ulcer of hard palate   . Vertigo    Past Surgical History:  Procedure Laterality Date  . ABDOMINAL HYSTERECTOMY  1968  . APPENDECTOMY  1940's  . COLONOSCOPY    . FOOT SURGERY Left   . HARDWARE REMOVAL Right 02/16/2016   Procedure: HARDWARE REMOVAL RT HIP;  Surgeon: Sheral Apley, MD;  Location: [facility] OR;  Service: Orthopedics;  Laterality: Right;  . HIP ARTHROPLASTY Right 02/16/2016   Procedure: ARTHROPLASTY BIPOLAR HIP (HEMIARTHROPLASTY);  Surgeon: Sheral Apley, MD;  Location: [facility] OR;  Service: Orthopedics;  Laterality: Right;  . INTRAMEDULLARY (IM) NAIL INTERTROCHANTERIC Right 09/11/2015   Procedure: INTRAMEDULLARY (IM) NAIL INTERTROCHANTRIC;  Surgeon: Sheral Apley, MD;  Location: [facility] OR;  Service: Orthopedics;  Laterality: Right;  . KNEE SURGERY      No Known Allergies  Allergies as of 04/14/2020   No Known Allergies     Medication List  Accurate as of Apr 14, 2020 11:59 PM. If you have any questions, ask your nurse or doctor.        acetaminophen 500 MG tablet Commonly known as: TYLENOL Take 500 mg by mouth every 6 (six) hours as needed.   amLODipine 5 MG tablet Commonly known as: NORVASC Take 5 mg by mouth daily.   atorvastatin 10 MG tablet Commonly known as: LIPITOR Take 10 mg by mouth daily.   Biotin 96759 MCG Tabs Take 10,000 mcg by mouth  daily.   calcium carbonate 1500 (600 Ca) MG Tabs tablet Commonly known as: OSCAL Take 600 mg of elemental calcium by mouth 2 (two) times daily with a meal.   docusate sodium 100 MG capsule Commonly known as: COLACE Take 100 mg by mouth 2 (two) times daily.   EPINEPHrine 0.3 mg/0.3 mL Soaj injection Commonly known as: EPI-PEN Inject 0.3 mLs (0.3 mg total) into the muscle once as needed (anaphylaxis/allergic reaction).   lactose free nutrition Liqd Take 237 mLs by mouth. Once a day at breakfast   magnesium hydroxide 400 MG/5ML suspension Commonly known as: MILK OF MAGNESIA Take by mouth daily as needed for mild constipation.   meclizine 12.5 MG tablet Commonly known as: ANTIVERT Take 12.5 mg by mouth every 12 (twelve) hours.   melatonin 3 MG Tabs tablet Take 3 mg by mouth at bedtime as needed (sleep).   memantine 10 MG tablet Commonly known as: NAMENDA Take 10 mg by mouth 2 (two) times daily.   polyethylene glycol 17 g packet Commonly known as: MIRALAX / GLYCOLAX Take 17 g by mouth every other day.   Therems-M Tabs Take 1 tablet by mouth daily.   PreserVision AREDS Tabs Take 2 tablets by mouth daily.   Vitamin D3 50 MCG (2000 UT) Tabs Take 2,000 Units by mouth daily.       Review of Systems  Review of Systems  Constitutional: Negative for activity change, appetite change, chills, diaphoresis, fatigue and fever.  HENT: Negative for mouth sores, postnasal drip, rhinorrhea, sinus pain and sore throat.   Respiratory: Negative for apnea, cough, chest tightness, shortness of breath and wheezing.   Cardiovascular: Negative for chest pain, palpitations and leg swelling.  Gastrointestinal: Negative for abdominal distention, abdominal pain, constipation, diarrhea, nausea and vomiting.  Genitourinary: Negative for dysuria and frequency.  Musculoskeletal: Negative for arthralgias, joint swelling and myalgias.  Skin: Negative for rash.  Neurological: Negative for dizziness,  syncope, weakness, light-headedness and numbness.  Psychiatric/Behavioral: Negative for behavioral problems, confusion and sleep disturbance.     Immunization History  Administered Date(s) Administered  . Influenza Whole 08/25/2018  . Influenza, High Dose Seasonal PF 08/24/2019  . Influenza-Unspecified 09/07/2016, 09/12/2017  . Moderna SARS-COVID-2 Vaccination 11/23/2019, 12/21/2019  . PPD Test 02/18/2016  . Pneumococcal Conjugate-13 09/18/2017  . Pneumococcal-Unspecified 10/22/2018  . Tdap 10/07/2012   Pertinent  Health Maintenance Due  Topic Date Due  . INFLUENZA VACCINE  06/21/2020  . DEXA SCAN  Completed  . PNA vac Low Risk Adult  Completed   Fall Risk  08/17/2018 08/03/2017 10/24/2014  Falls in the past year? No No Yes  Number falls in past yr: - - 1  Injury with Fall? - - Yes  Risk Factor Category  - - High Fall Risk  Risk for fall due to : - - History of fall(s)   Functional Status Survey:    Vitals:   04/14/20 2013  BP: 116/70  Pulse: 76  Temp: 97.6 F (36.4 C)  Weight: 122 lb (55.3 kg)   Body mass index is 20.3 kg/m. Physical Exam  Constitutional: Oriented to person, place, and time. Well-developed and well-nourished.  HENT:  Head: Normocephalic.  Mouth/Throat: Oropharynx is clear and moist.  Eyes: Pupils are equal, round, and reactive to light.  Neck: Neck supple.  Cardiovascular: Normal rate and normal heart sounds.  No murmur heard. Pulmonary/Chest: Effort normal and breath sounds normal. No respiratory distress. No wheezes. She has no rales.  Abdominal: Soft. Bowel sounds are normal. No distension. There is no tenderness. There is no rebound.  Musculoskeletal: No edema.  Lymphadenopathy: none Neurological: Alert and oriented to person, place, and time.  Skin: Skin is warm and dry.  Psychiatric: Normal mood and affect. Behavior is normal. Thought content normal.    Labs reviewed: Recent Labs    03/06/20 0000 03/09/20 0000 03/17/20 0000  NA  126* 131* 131*  K 4.0 4.4 4.1  CL 98* 101 100  CO2 20 22 24*  BUN 18 18 13   CREATININE 1.2* 0.9 0.7  CALCIUM 9.7 10.0 10.2   Recent Labs    06/25/19 0000 03/05/20 0000  AST 20 20  ALT 14 13  ALKPHOS 42 54  PROT 6.7  --   ALBUMIN 4.1 4.0   Recent Labs    06/25/19 0000 03/05/20 0000  WBC 5.4 5.8  NEUTROABS 3,483 818  HGB 12.4 13.5  HCT 35* 39  PLT 300 254   Lab Results  Component Value Date   TSH 2.07 06/25/2019   Lab Results  Component Value Date   HGBA1C 5.4 03/14/2018   Lab Results  Component Value Date   CHOL 183 03/05/2020   HDL 70 03/05/2020   LDLCALC 94 03/05/2020   TRIG 97 03/05/2020   CHOLHDL 2.6 03/14/2018    Significant Diagnostic Results in last 30 days:  No results found.  Assessment/Plan  PAF (paroxysmal atrial fibrillation) (HCC) No Definite Diagnosis On ASpirin  Mixed hyperlipidemia On Lipitor Last LDL 94 Age-related osteoporosis without current pathological fracture Has been on Reclast before On Calcium and Vit d Benign paroxysmal positional vertigo, unspecified laterality Meclizine PRN Herpes zoster without complication Recovered Memory deficit Stable on Namenda Essential hypertension Stable on Norvasc   Family/ staff Communication:   Labs/tests ordered:

## 2020-04-26 ENCOUNTER — Encounter: Payer: Self-pay | Admitting: Internal Medicine

## 2020-05-05 ENCOUNTER — Ambulatory Visit (INDEPENDENT_AMBULATORY_CARE_PROVIDER_SITE_OTHER): Payer: Medicare PPO | Admitting: Ophthalmology

## 2020-05-05 ENCOUNTER — Encounter (INDEPENDENT_AMBULATORY_CARE_PROVIDER_SITE_OTHER): Payer: Self-pay | Admitting: Ophthalmology

## 2020-05-05 ENCOUNTER — Other Ambulatory Visit: Payer: Self-pay

## 2020-05-05 DIAGNOSIS — H35351 Cystoid macular degeneration, right eye: Secondary | ICD-10-CM

## 2020-05-05 DIAGNOSIS — H353211 Exudative age-related macular degeneration, right eye, with active choroidal neovascularization: Secondary | ICD-10-CM

## 2020-05-05 DIAGNOSIS — H353222 Exudative age-related macular degeneration, left eye, with inactive choroidal neovascularization: Secondary | ICD-10-CM

## 2020-05-05 DIAGNOSIS — H353212 Exudative age-related macular degeneration, right eye, with inactive choroidal neovascularization: Secondary | ICD-10-CM | POA: Insufficient documentation

## 2020-05-05 MED ORDER — BEVACIZUMAB CHEMO INJECTION 1.25MG/0.05ML SYRINGE FOR KALEIDOSCOPE
1.2500 mg | INTRAVITREAL | Status: AC | PRN
  Administered 2020-05-05: 1.25 mg via INTRAVITREAL

## 2020-05-05 NOTE — Progress Notes (Signed)
05/05/2020     CHIEF COMPLAINT Patient presents for Retina Follow Up   HISTORY OF PRESENT ILLNESS: Candice Willis is a 86 y.o. female who presents to the clinic today for:   HPI    Retina Follow Up    Patient presents with  Wet AMD.  In right eye.  This started 10 weeks ago.  Severity is mild.  Duration of 10 weeks.  Since onset it is stable.          Comments    10 Week AMD F/U OD, poss Avastin OD  Pt denies noticeable changes to [place] OU since last visit. Pt denies ocular pain, flashes of light, or floaters OU.         Last edited by Ileana Roup, COA on 05/05/2020  2:04 PM. (History)      Referring physician: Mast, Man X, NP [address] [place],  [place] [address]  HISTORICAL INFORMATION:   Selected notes from the MEDICAL RECORD NUMBER    Lab Results  Component Value Date   HGBA1C 5.4 03/14/2018     CURRENT MEDICATIONS: No current outpatient medications on file. (Ophthalmic Drugs)   No current facility-administered medications for this visit. (Ophthalmic Drugs)   Current Outpatient Medications (Other)  Medication Sig  . acetaminophen (TYLENOL) 500 MG tablet Take 500 mg by mouth every 6 (six) hours as needed.  Marland Kitchen amLODipine (NORVASC) 5 MG tablet Take 5 mg by mouth daily.  Marland Kitchen atorvastatin (LIPITOR) 10 MG tablet Take 10 mg by mouth daily.  . Biotin 10175 MCG TABS Take 10,000 mcg by mouth daily.   . calcium carbonate (OSCAL) 1500 (600 Ca) MG TABS tablet Take 600 mg of elemental calcium by mouth 2 (two) times daily with a meal.  . Cholecalciferol (VITAMIN D3) 2000 units TABS Take 2,000 Units by mouth daily.  Marland Kitchen docusate sodium (COLACE) 100 MG capsule Take 100 mg by mouth 2 (two) times daily.   Marland Kitchen EPINEPHrine 0.3 mg/0.3 mL IJ SOAJ injection Inject 0.3 mLs (0.3 mg total) into the muscle once as needed (anaphylaxis/allergic reaction).  . lactose free nutrition (BOOST) LIQD Take 237 mLs by mouth. Once a day at breakfast  . magnesium hydroxide (MILK OF MAGNESIA) 400  MG/5ML suspension Take by mouth daily as needed for mild constipation.  . meclizine (ANTIVERT) 12.5 MG tablet Take 12.5 mg by mouth every 12 (twelve) hours.   . Melatonin 3 MG TABS Take 3 mg by mouth at bedtime as needed (sleep).  . memantine (NAMENDA) 10 MG tablet Take 10 mg by mouth 2 (two) times daily.   . Multiple Vitamins-Minerals (PRESERVISION AREDS) TABS Take 2 tablets by mouth daily.  . Multiple Vitamins-Minerals (THEREMS-M) TABS Take 1 tablet by mouth daily.  . polyethylene glycol (MIRALAX / GLYCOLAX) packet Take 17 g by mouth every other day.   No current facility-administered medications for this visit. (Other)      REVIEW OF SYSTEMS:    ALLERGIES No Known Allergies  PAST MEDICAL HISTORY Past Medical History:  Diagnosis Date  . Arteriosclerotic cardiovascular disease   . Bee sting reaction 08/23/2016   Tongue swelling and difficulty swallowing /notes 08/23/2016  . Constipation   . Degenerative joint disease of right hip 10/15/14  . Fracture of multiple pubic rami (HCC) 10/15/14   Right inferior and superior pubic rami  . HOH (hard of hearing)    Wears bilateral hearing aids  . Hyperlipidemia   . Hypertension   . Macular degeneration   .  Memory deficit   . Osteoporosis   . Pain in right foot   . Palpitations   . Paroxysmal atrial fibrillation (HCC)   . Thyroid nodule    Status post surgery  . Ulcer of hard palate   . Vertigo    Past Surgical History:  Procedure Laterality Date  . ABDOMINAL HYSTERECTOMY  1968  . APPENDECTOMY  1940's  . COLONOSCOPY    . FOOT SURGERY Left   . HARDWARE REMOVAL Right 02/16/2016   Procedure: HARDWARE REMOVAL RT HIP;  Surgeon: Sheral Apley, MD;  Location: [facility] OR;  Service: Orthopedics;  Laterality: Right;  . HIP ARTHROPLASTY Right 02/16/2016   Procedure: ARTHROPLASTY BIPOLAR HIP (HEMIARTHROPLASTY);  Surgeon: Sheral Apley, MD;  Location: [facility] OR;  Service: Orthopedics;  Laterality: Right;  . INTRAMEDULLARY (IM) NAIL  INTERTROCHANTERIC Right 09/11/2015   Procedure: INTRAMEDULLARY (IM) NAIL INTERTROCHANTRIC;  Surgeon: Sheral Apley, MD;  Location: [facility] OR;  Service: Orthopedics;  Laterality: Right;  . KNEE SURGERY      FAMILY HISTORY Family History  Problem Relation Age of Onset  . Heart disease Father   . Cancer Father        prostate  . Cancer Maternal Grandmother        colon  . Heart disease Paternal Grandmother   . Hyperlipidemia Sister   . Cancer Sister        breast  . Heart disease Brother   . Hyperlipidemia Brother   . Cancer Brother        prostate    SOCIAL HISTORY Social History   Tobacco Use  . Smoking status: Former Smoker    Packs/day: 0.50    Types: Cigarettes    Quit date: 10/05/1971    Years since quitting: 48.6  . Smokeless tobacco: Never Used  Vaping Use  . Vaping Use: Never used  Substance Use Topics  . Alcohol use: Yes    Comment: 1-4 per week; eats 10 raisins daily soaked in Gin  . Drug use: No         OPHTHALMIC EXAM:  Base Eye Exam    Visual Acuity (ETDRS)      Right Left   Dist cc 20/70 +2 20/25 -2   Dist ph cc NI    Correction: Glasses       Tonometry (Tonopen, 2:08 PM)      Right Left   Pressure 12 16       Pupils      Pupils Dark Light Shape React APD   Right PERRL 3 2 Round Sluggish None   Left PERRL 3 2 Round Sluggish None       Visual Fields (Counting fingers)      Left Right    Full Full       Extraocular Movement      Right Left    Full Full       Neuro/Psych    Oriented x3: Yes   Mood/Affect: Normal       Dilation    Right eye: 1.0% Mydriacyl, 2.5% Phenylephrine @ 2:08 PM        Slit Lamp and Fundus Exam    External Exam      Right Left   External Normal Normal       Slit Lamp Exam      Right Left   Lids/Lashes Normal Normal   Conjunctiva/Sclera White and quiet White and quiet   Cornea Clear Clear   Anterior Chamber Deep and quiet  Deep and quiet   Iris Round and reactive Round and reactive   Lens  Centered posterior chamber intraocular lens Centered posterior chamber intraocular lens   Anterior Vitreous Normal Normal       Fundus Exam      Right Left   Posterior Vitreous Normal Normal   Disc Normal Normal   C/D Ratio 0.1 0.1   Macula Advanced age related macular degeneration, Drusen, no exudates, Macular thickening, Subretinal neovascular membrane Advanced age related macular degeneration, Disciform scar   Vessels Normal    Periphery Normal           IMAGING AND PROCEDURES  Imaging and Procedures for 05/05/20  OCT, Retina - OU - Both Eyes       Right Eye Central Foveal Thickness: 310. Progression has been stable. Findings include retinal drusen , choroidal neovascular membrane, cystoid macular edema, abnormal foveal contour.   Left Eye Central Foveal Thickness: 343. Findings include abnormal foveal contour, subretinal fluid, pigment epithelial detachment.   Notes OD with chronically active CN VM, CME are stable and stable acuity at the 11-week interval today repeat intravitreal Avastin today. 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 2:43 PM. Confirmed correct patient, procedure, site, and patient consented.   Anesthesia Topical anesthesia was used. Anesthetic medications included Akten 3.5%.   Procedure Preparation included Tobramycin 0.3%, 10% betadine to eyelids. A 30 gauge needle was used.   Injection:  1.25 mg Bevacizumab (AVASTIN) SOLN   NDC: [address]-8, Lot: [id number]   Route: Intravitreal, Site: Right Eye, Waste: 0 mg  Post-op Post injection exam found visual acuity of at least counting fingers. The patient tolerated the procedure well. There were no complications. The patient received written and verbal post procedure care education. Post injection medications were not given.                  ASSESSMENT/PLAN:  Exudative age-related macular degeneration of right eye with active choroidal neovascularization (HCC) The nature of wet macular degeneration was discussed with the patient.  Forms of therapy reviewed include the use of Anti-VEGF medications injected painlessly into the eye, as well as other possible treatment modalities, including thermal laser therapy. Fellow eye involvement and risks were discussed with the patient. Upon the finding of wet age related macular degeneration, treatment will be offered. The treatment regimen is on a treat as needed basis with the intent to treat if necessary and extend interval of exams when possible. On average 1 out of 6 patients do not need lifetime therapy. However, the risk of recurrent disease is high for a lifetime.  Initially monthly, then periodic, examinations and evaluations will determine whether the next treatment is required on the day of the examination.  OD chronically active with CME, stable on 8 to 10-week exam intervals.  tODays visit at 11-week interval.  Cystoid macular edema of right eye Secondary to the CNVM chronically active      ICD-10-CM   1. Exudative age-related macular degeneration of right eye with active choroidal neovascularization (HCC)  H35.3211 OCT, Retina - OU - Both Eyes    Intravitreal Injection, Pharmacologic Agent - OD - Right Eye    Bevacizumab (AVASTIN) SOLN 1.25 mg  2. Cystoid macular edema of right eye  H35.351   3. Exudative age-related macular degeneration of left eye  with inactive choroidal neovascularization ([place])  H35.3222     1.  2.  3.  Ophthalmic Meds Ordered this visit:  Meds ordered this encounter  Medications  . Bevacizumab (AVASTIN) SOLN 1.25 mg       Return in about 3 months (around 08/05/2020) for AVASTIN OCT, OD, dilate.  There are no Patient Instructions on file for this visit.   Explained the diagnoses, plan, and follow up with the patient and they expressed  understanding.  Patient expressed understanding of the importance of proper follow up care.   Clent Demark Rital Cavey M.D. Diseases & Surgery of the Retina and Vitreous Retina [facility] Diabetic [place] 05/05/20     Abbreviations: M myopia (nearsighted); A astigmatism; H hyperopia (farsighted); P presbyopia; Mrx spectacle prescription;  CTL contact lenses; OD right eye; OS left eye; OU both eyes  XT exotropia; ET esotropia; PEK punctate epithelial keratitis; PEE punctate epithelial erosions; DES dry eye syndrome; MGD meibomian gland dysfunction; ATs artificial tears; PFAT's preservative free artificial tears; [place] nuclear sclerotic cataract; PSC posterior subcapsular cataract; ERM epi-retinal membrane; PVD posterior vitreous detachment; RD retinal detachment; DM diabetes mellitus; DR diabetic retinopathy; NPDR non-proliferative diabetic retinopathy; PDR proliferative diabetic retinopathy; CSME clinically significant macular edema; DME diabetic macular edema; dbh dot blot hemorrhages; CWS cotton wool spot; POAG primary open angle glaucoma; C/D cup-to-disc ratio; HVF humphrey visual field; GVF goldmann visual field; OCT optical coherence tomography; IOP intraocular pressure; BRVO Branch retinal vein occlusion; CRVO central retinal vein occlusion; CRAO central retinal artery occlusion; BRAO branch retinal artery occlusion; RT retinal tear; SB scleral buckle; PPV pars plana vitrectomy; VH Vitreous hemorrhage; PRP panretinal laser photocoagulation; IVK intravitreal kenalog; VMT vitreomacular traction; MH Macular hole;  NVD neovascularization of the disc; NVE neovascularization elsewhere; AREDS age related eye disease study; ARMD age related macular degeneration; POAG primary open angle glaucoma; EBMD epithelial/anterior basement membrane dystrophy; ACIOL anterior chamber intraocular lens; IOL intraocular lens; PCIOL posterior chamber intraocular lens; Phaco/IOL phacoemulsification with intraocular lens placement; [place]  photorefractive keratectomy; LASIK laser assisted in situ keratomileusis; HTN hypertension; DM diabetes mellitus; COPD chronic obstructive pulmonary disease

## 2020-05-05 NOTE — Assessment & Plan Note (Signed)
The nature of wet macular degeneration was discussed with the patient.  Forms of therapy reviewed include the use of Anti-VEGF medications injected painlessly into the eye, as well as other possible treatment modalities, including thermal laser therapy. Fellow eye involvement and risks were discussed with the patient. Upon the finding of wet age related macular degeneration, treatment will be offered. The treatment regimen is on a treat as needed basis with the intent to treat if necessary and extend interval of exams when possible. On average 1 out of 6 patients do not need lifetime therapy. However, the risk of recurrent disease is high for a lifetime.  Initially monthly, then periodic, examinations and evaluations will determine whether the next treatment is required on the day of the examination.  OD chronically active with CME, stable on 8 to 10-week exam intervals.  tODays visit at 11-week interval.

## 2020-05-05 NOTE — Assessment & Plan Note (Signed)
Secondary to the CNVM chronically active

## 2020-05-28 ENCOUNTER — Non-Acute Institutional Stay: Payer: Medicare PPO | Admitting: Nurse Practitioner

## 2020-05-28 ENCOUNTER — Encounter: Payer: Self-pay | Admitting: Nurse Practitioner

## 2020-05-28 DIAGNOSIS — R1310 Dysphagia, unspecified: Secondary | ICD-10-CM | POA: Diagnosis not present

## 2020-05-28 DIAGNOSIS — I10 Essential (primary) hypertension: Secondary | ICD-10-CM

## 2020-05-28 DIAGNOSIS — I480 Paroxysmal atrial fibrillation: Secondary | ICD-10-CM | POA: Diagnosis not present

## 2020-05-28 DIAGNOSIS — M810 Age-related osteoporosis without current pathological fracture: Secondary | ICD-10-CM | POA: Diagnosis not present

## 2020-05-28 DIAGNOSIS — K5909 Other constipation: Secondary | ICD-10-CM | POA: Diagnosis not present

## 2020-05-28 DIAGNOSIS — E782 Mixed hyperlipidemia: Secondary | ICD-10-CM

## 2020-05-28 DIAGNOSIS — R413 Other amnesia: Secondary | ICD-10-CM | POA: Diagnosis not present

## 2020-05-28 NOTE — Assessment & Plan Note (Signed)
05/28/20 Nectar liquids, pudding thick soup

## 2020-05-28 NOTE — Progress Notes (Signed)
Location:   [facility] Room Number: 822 Place of Service:  ALF (13) Provider:  Chipper Oman NP   Mast, Man X, NP  Patient Care Team: Mast, Man X, NP as PCP - General (Internal Medicine) Nahser, Deloris Ping, MD as PCP - Cardiology (Cardiology) Venita Lick, MD as Consulting Physician (Orthopedic Surgery) Marykay Lex, MD as Consulting Physician (Cardiology) Mast, Man X, NP as Nurse Practitioner (Internal Medicine)  Extended Emergency Contact Information Primary Emergency Contact: [name],Averianna Address: [address]           [place], [place] [address] [place] Home Phone: [phone] Mobile Phone: [phone] Relation: Niece Secondary Emergency Contact: [name],[name]  [place] Mobile Phone: [phone] Relation: Niece  Code Status:  DNR Goals of care: Advanced Directive information Advanced Directives 05/28/2020  Does Patient Have a Medical Advance Directive? Yes  Type of Advance Directive Out of facility DNR (pink MOST or yellow form)  Does patient want to make changes to medical advance directive? No - Patient declined  Copy of Healthcare Power of Attorney in Chart? -  Would patient like information on creating a medical advance directive? -  Pre-existing out of facility DNR order (yellow form or pink MOST form) Yellow form placed in chart (order not valid for inpatient use);Pink MOST form placed in chart (order not valid for inpatient use)     Chief Complaint  Patient presents with   Medical Management of Chronic Issues    HPI:  Pt is a 86 y.o. female seen today for medical management of chronic diseases.     Past Medical History:  Diagnosis Date   Arteriosclerotic cardiovascular disease    Bee sting reaction 08/23/2016   Tongue swelling and difficulty swallowing /notes 08/23/2016   Constipation    Degenerative joint disease of right hip 10/15/14   Fracture of multiple pubic rami (HCC) 10/15/14    Right inferior and superior pubic rami   HOH (hard of hearing)    Wears bilateral hearing aids   Hyperlipidemia    Hypertension    Macular degeneration    Memory deficit    Osteoporosis    Pain in right foot    Palpitations    Paroxysmal atrial fibrillation (HCC)    Thyroid nodule    Status post surgery   Ulcer of hard palate    Vertigo    Past Surgical History:  Procedure Laterality Date   ABDOMINAL HYSTERECTOMY  1968   APPENDECTOMY  1940's   COLONOSCOPY     FOOT SURGERY Left    HARDWARE REMOVAL Right 02/16/2016   Procedure: HARDWARE REMOVAL RT HIP;  Surgeon: Sheral Apley, MD;  Location: [facility] OR;  Service: Orthopedics;  Laterality: Right;   HIP ARTHROPLASTY Right 02/16/2016   Procedure: ARTHROPLASTY BIPOLAR HIP (HEMIARTHROPLASTY);  Surgeon: Sheral Apley, MD;  Location: [facility] OR;  Service: Orthopedics;  Laterality: Right;   INTRAMEDULLARY (IM) NAIL INTERTROCHANTERIC Right 09/11/2015   Procedure: INTRAMEDULLARY (IM) NAIL INTERTROCHANTRIC;  Surgeon: Sheral Apley, MD;  Location: [facility] OR;  Service: Orthopedics;  Laterality: Right;   KNEE SURGERY      No Known Allergies  Allergies as of 05/28/2020   No Known Allergies     Medication List       Accurate as of May 28, 2020 11:12 AM. If you have any questions, ask your nurse or doctor.        STOP taking these medications   magnesium hydroxide 400 MG/5ML suspension Commonly  known as: MILK OF MAGNESIA Stopped by: Man X Mast, NP     TAKE these medications   acetaminophen 500 MG tablet Commonly known as: TYLENOL Take 500 mg by mouth every 6 (six) hours as needed.   amLODipine 5 MG tablet Commonly known as: NORVASC Take 5 mg by mouth daily.   atorvastatin 10 MG tablet Commonly known as: LIPITOR Take 10 mg by mouth daily.   Biotin 97416 MCG Tabs Take 10,000 mcg by mouth daily.   calcium carbonate 1500 (600 Ca) MG Tabs tablet Commonly known as: OSCAL Take 600 mg of elemental calcium by mouth  2 (two) times daily with a meal.   docusate sodium 100 MG capsule Commonly known as: COLACE Take 100 mg by mouth 2 (two) times daily.   EPINEPHrine 0.3 mg/0.3 mL Soaj injection Commonly known as: EPI-PEN Inject 0.3 mLs (0.3 mg total) into the muscle once as needed (anaphylaxis/allergic reaction).   lactose free nutrition Liqd Take 237 mLs by mouth. Once a day at breakfast   meclizine 12.5 MG tablet Commonly known as: ANTIVERT Take 12.5 mg by mouth every 12 (twelve) hours.   melatonin 3 MG Tabs tablet Take 3 mg by mouth at bedtime as needed (sleep).   memantine 10 MG tablet Commonly known as: NAMENDA Take 10 mg by mouth 2 (two) times daily.   polyethylene glycol 17 g packet Commonly known as: MIRALAX / GLYCOLAX Take 17 g by mouth every other day.   Therems-M Tabs Take 1 tablet by mouth daily.   PreserVision AREDS Tabs Take 2 tablets by mouth daily.   Vitamin D3 50 MCG (2000 UT) Tabs Take 2,000 Units by mouth daily.       Review of Systems  Immunization History  Administered Date(s) Administered   Influenza Whole 08/25/2018   Influenza, High Dose Seasonal PF 08/24/2019   Influenza-Unspecified 09/07/2016, 09/12/2017   Moderna SARS-COVID-2 Vaccination 11/23/2019, 12/21/2019   PPD Test 02/18/2016   Pneumococcal Conjugate-13 09/18/2017   Pneumococcal-Unspecified 10/22/2018   Tdap 10/07/2012   Pertinent  Health Maintenance Due  Topic Date Due   INFLUENZA VACCINE  06/21/2020   DEXA SCAN  Completed   PNA vac Low Risk Adult  Completed   Fall Risk  08/17/2018 08/03/2017 10/24/2014  Falls in the past year? No No Yes  Number falls in past yr: - - 1  Injury with Fall? - - Yes  Risk Factor Category  - - High Fall Risk  Risk for fall due to : - - History of fall(s)   Functional Status Survey:    Vitals:   05/28/20 1108  BP: 112/70  Pulse: 63  Resp: 20  Temp: 98.4 F (36.9 C)  SpO2: 97%  Weight: 122 lb 6.4 oz (55.5 kg)  Height: 5\' 5"  (1.651 m)    Body mass index is 20.37 kg/m. Physical Exam  Labs reviewed: Recent Labs    03/06/20 0000 03/09/20 0000 03/17/20 0000  NA 126* 131* 131*  K 4.0 4.4 4.1  CL 98* 101 100  CO2 20 22 24*  BUN 18 18 13   CREATININE 1.2* 0.9 0.7  CALCIUM 9.7 10.0 10.2   Recent Labs    06/25/19 0000 03/05/20 0000  AST 20 20  ALT 14 13  ALKPHOS 42 54  PROT 6.7  --   ALBUMIN 4.1 4.0   Recent Labs    06/25/19 0000 03/05/20 0000  WBC 5.4 5.8  NEUTROABS 3,483 818  HGB 12.4 13.5  HCT 35* 39  PLT  300 254   Lab Results  Component Value Date   TSH 2.07 06/25/2019   Lab Results  Component Value Date   HGBA1C 5.4 03/14/2018   Lab Results  Component Value Date   CHOL 183 03/05/2020   HDL 70 03/05/2020   LDLCALC 94 03/05/2020   TRIG 97 03/05/2020   CHOLHDL 2.6 03/14/2018    Significant Diagnostic Results in last 30 days:  Intravitreal Injection, Pharmacologic Agent - OD - Right Eye  Result Date: 05/05/2020 Time Out 05/05/2020. 2:43 PM. Confirmed correct patient, procedure, site, and patient consented. Anesthesia Topical anesthesia was used. Anesthetic medications included Akten 3.5%. Procedure Preparation included Tobramycin 0.3%, 10% betadine to eyelids. A 30 gauge needle was used. Injection: 1.25 mg Bevacizumab (AVASTIN) SOLN   NDC: [address]-9, Lot: [id number]   Route: Intravitreal, Site: Right Eye, Waste: 0 mg Post-op Post injection exam found visual acuity of at least counting fingers. The patient tolerated the procedure well. There were no complications. The patient received written and verbal post procedure care education. Post injection medications were not given.   OCT, Retina - OU - Both Eyes  Result Date: 05/05/2020 Right Eye Central Foveal Thickness: 310. Progression has been stable. Findings include retinal drusen , choroidal neovascular membrane, cystoid macular edema, abnormal foveal contour. Left Eye Central Foveal Thickness: 343. Findings include abnormal foveal contour,  subretinal fluid, pigment epithelial detachment. Notes OD with chronically active CN VM, CME are stable and stable acuity at the 11-week interval today repeat intravitreal Avastin today. 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   Family/ staff Communication:   Labs/tests ordered:

## 2020-05-28 NOTE — Progress Notes (Signed)
Location:   [facility] Room Number: 822 Place of Service:  ALF (13) Provider: Arna Snipe Myriah Boggus NP  Keiandra Sullenger X, NP  Patient Care Team: Aleda Madl X, NP as PCP - General (Internal Medicine) Nahser, Deloris Ping, MD as PCP - Cardiology (Cardiology) Venita Lick, MD as Consulting Physician (Orthopedic Surgery) Marykay Lex, MD as Consulting Physician (Cardiology) Sathvik Tiedt X, NP as Nurse Practitioner (Internal Medicine)  Extended Emergency Contact Information Primary Emergency Contact: [name],Sona Address: [address]           [place], [place] [address] [place] of [place] Home Phone: [phone] Mobile Phone: [phone] Relation: Niece Secondary Emergency Contact: [name],[name]  [place] Mobile Phone: [phone] Relation: Niece  Code Status:  DNR Goals of care: Advanced Directive information Advanced Directives 05/28/2020  Does Patient Have a Medical Advance Directive? Yes  Type of Advance Directive Out of facility DNR (pink MOST or yellow form)  Does patient want to make changes to medical advance directive? No - Patient declined  Copy of Healthcare Power of Attorney in Chart? -  Would patient like information on creating a medical advance directive? -  Pre-existing out of facility DNR order (yellow form or pink MOST form) Yellow form placed in chart (order not valid for inpatient use);Pink MOST form placed in chart (order not valid for inpatient use)     Chief Complaint  Patient presents with  . Medical Management of Chronic Issues    HPI:  Pt is a 86 y.o. female seen today for medical management of chronic diseases.    The patient resides in [facility] for safety, care assistance, on Memantine 10mg  bid for memory.   HTN, blood pressure is controlled on Amlodipine 5mg  qd.   Hyperlipidemia, takes Atorvastatin 10mg  qd.   OP, takes Ca, Vit D  Constipation,takes Colace bid, MiraLax qod.    Past Medical History:  Diagnosis Date  .  Arteriosclerotic cardiovascular disease   . Bee sting reaction 08/23/2016   Tongue swelling and difficulty swallowing /notes 08/23/2016  . Constipation   . Degenerative joint disease of right hip 10/15/14  . Fracture of multiple pubic rami (HCC) 10/15/14   Right inferior and superior pubic rami  . HOH (hard of hearing)    Wears bilateral hearing aids  . Hyperlipidemia   . Hypertension   . Macular degeneration   . Memory deficit   . Osteoporosis   . Pain in right foot   . Palpitations   . Paroxysmal atrial fibrillation (HCC)   . Thyroid nodule    Status post surgery  . Ulcer of hard palate   . Vertigo    Past Surgical History:  Procedure Laterality Date  . ABDOMINAL HYSTERECTOMY  1968  . APPENDECTOMY  1940's  . COLONOSCOPY    . FOOT SURGERY Left   . HARDWARE REMOVAL Right 02/16/2016   Procedure: HARDWARE REMOVAL RT HIP;  Surgeon: 10/17/14, MD;  Location: [facility] OR;  Service: Orthopedics;  Laterality: Right;  . HIP ARTHROPLASTY Right 02/16/2016   Procedure: ARTHROPLASTY BIPOLAR HIP (HEMIARTHROPLASTY);  Surgeon: Sheral Apley, MD;  Location: [facility] OR;  Service: Orthopedics;  Laterality: Right;  . INTRAMEDULLARY (IM) NAIL INTERTROCHANTERIC Right 09/11/2015   Procedure: INTRAMEDULLARY (IM) NAIL INTERTROCHANTRIC;  Surgeon: Sheral Apley, MD;  Location: [facility] OR;  Service: Orthopedics;  Laterality: Right;  . KNEE SURGERY      No Known Allergies  Allergies as of 05/28/2020   No Known Allergies  Medication List       Accurate as of May 28, 2020 11:59 PM. If you have any questions, ask your nurse or doctor.        STOP taking these medications   magnesium hydroxide 400 MG/5ML suspension Commonly known as: MILK OF MAGNESIA Stopped by: Shania Bjelland X Treyshaun Keatts, NP     TAKE these medications   acetaminophen 500 MG tablet Commonly known as: TYLENOL Take 500 mg by mouth every 6 (six) hours as needed.   amLODipine 5 MG tablet Commonly known as: NORVASC Take 5 mg by mouth daily.     atorvastatin 10 MG tablet Commonly known as: LIPITOR Take 10 mg by mouth daily.   Biotin 44034 MCG Tabs Take 10,000 mcg by mouth daily.   calcium carbonate 1500 (600 Ca) MG Tabs tablet Commonly known as: OSCAL Take 600 mg of elemental calcium by mouth 2 (two) times daily with a meal.   docusate sodium 100 MG capsule Commonly known as: COLACE Take 100 mg by mouth 2 (two) times daily.   EPINEPHrine 0.3 mg/0.3 mL Soaj injection Commonly known as: EPI-PEN Inject 0.3 mLs (0.3 mg total) into the muscle once as needed (anaphylaxis/allergic reaction).   lactose free nutrition Liqd Take 237 mLs by mouth. Once a day at breakfast   meclizine 12.5 MG tablet Commonly known as: ANTIVERT Take 12.5 mg by mouth every 12 (twelve) hours.   melatonin 3 MG Tabs tablet Take 3 mg by mouth at bedtime as needed (sleep).   memantine 10 MG tablet Commonly known as: NAMENDA Take 10 mg by mouth 2 (two) times daily.   polyethylene glycol 17 g packet Commonly known as: MIRALAX / GLYCOLAX Take 17 g by mouth every other day.   Therems-M Tabs Take 1 tablet by mouth daily.   PreserVision AREDS Tabs Take 2 tablets by mouth daily.   Vitamin D3 50 MCG (2000 UT) Tabs Take 2,000 Units by mouth daily.       Review of Systems  Constitutional: Negative for activity change, appetite change, fatigue, fever and unexpected weight change.  HENT: Positive for hearing loss. Negative for congestion and voice change.        Hears better left ear  Respiratory: Negative for cough and shortness of breath.   Cardiovascular: Negative for leg swelling.  Gastrointestinal: Negative for abdominal pain and constipation.  Genitourinary: Negative for difficulty urinating, dysuria and urgency.  Musculoskeletal: Positive for gait problem.  Skin: Negative for color change.  Neurological: Negative for speech difficulty, weakness, light-headedness and headaches.       Memory lapses.   Psychiatric/Behavioral: Positive for  confusion. Negative for behavioral problems and sleep disturbance. The patient is not nervous/anxious.     Immunization History  Administered Date(s) Administered  . Influenza Whole 08/25/2018  . Influenza, High Dose Seasonal PF 08/24/2019  . Influenza-Unspecified 09/07/2016, 09/12/2017  . Moderna SARS-COVID-2 Vaccination 11/23/2019, 12/21/2019  . PPD Test 02/18/2016  . Pneumococcal Conjugate-13 09/18/2017  . Pneumococcal-Unspecified 10/22/2018  . Tdap 10/07/2012   Pertinent  Health Maintenance Due  Topic Date Due  . INFLUENZA VACCINE  06/21/2020  . DEXA SCAN  Completed  . PNA vac Low Risk Adult  Completed   Fall Risk  08/17/2018 08/03/2017 10/24/2014  Falls in the past year? No No Yes  Number falls in past yr: - - 1  Injury with Fall? - - Yes  Risk Factor Category  - - High Fall Risk  Risk for fall due to : - - History of  fall(s)   Functional Status Survey:    Vitals:   05/28/20 1108  BP: 112/70  Pulse: 63  Resp: 20  Temp: 98.4 F (36.9 C)  SpO2: 97%  Weight: 122 lb 6.4 oz (55.5 kg)  Height: 5\' 5"  (1.651 m)   Body mass index is 20.37 kg/m. Physical Exam Vitals and nursing note reviewed.  Constitutional:      Appearance: Normal appearance. She is normal weight.  HENT:     Head: Normocephalic and atraumatic.     Nose: Nose normal.     Mouth/Throat:     Mouth: Mucous membranes are moist.  Eyes:     Extraocular Movements: Extraocular movements intact.     Conjunctiva/sclera: Conjunctivae normal.     Pupils: Pupils are equal, round, and reactive to light.  Cardiovascular:     Rate and Rhythm: Normal rate and regular rhythm.     Heart sounds: No murmur heard.   Pulmonary:     Effort: Pulmonary effort is normal.     Breath sounds: No rales.  Abdominal:     General: Bowel sounds are normal.     Palpations: Abdomen is soft.     Tenderness: There is no abdominal tenderness.  Musculoskeletal:     Cervical back: Normal range of motion and neck supple.     Right  lower leg: No edema.     Left lower leg: No edema.  Skin:    General: Skin is warm and dry.  Neurological:     General: No focal deficit present.     Mental Status: She is alert. Mental status is at baseline.     Gait: Gait abnormal.     Comments: Oriented to person, place.   Psychiatric:        Mood and Affect: Mood normal.        Behavior: Behavior normal.        Thought Content: Thought content normal.     Labs reviewed: Recent Labs    03/06/20 0000 03/09/20 0000 03/17/20 0000  NA 126* 131* 131*  K 4.0 4.4 4.1  CL 98* 101 100  CO2 20 22 24*  BUN 18 18 13   CREATININE 1.2* 0.9 0.7  CALCIUM 9.7 10.0 10.2   Recent Labs    06/25/19 0000 03/05/20 0000  AST 20 20  ALT 14 13  ALKPHOS 42 54  PROT 6.7  --   ALBUMIN 4.1 4.0   Recent Labs    06/25/19 0000 03/05/20 0000  WBC 5.4 5.8  NEUTROABS 3,483 818  HGB 12.4 13.5  HCT 35* 39  PLT 300 254   Lab Results  Component Value Date   TSH 2.07 06/25/2019   Lab Results  Component Value Date   HGBA1C 5.4 03/14/2018   Lab Results  Component Value Date   CHOL 183 03/05/2020   HDL 70 03/05/2020   LDLCALC 94 03/05/2020   TRIG 97 03/05/2020   CHOLHDL 2.6 03/14/2018    Significant Diagnostic Results in last 30 days:  Intravitreal Injection, Pharmacologic Agent - OD - Right Eye  Result Date: 05/05/2020 Time Out 05/05/2020. 2:43 PM. Confirmed correct patient, procedure, site, and patient consented. Anesthesia Topical anesthesia was used. Anesthetic medications included Akten 3.5%. Procedure Preparation included Tobramycin 0.3%, 10% betadine to eyelids. A 30 gauge needle was used. Injection: 1.25 mg Bevacizumab (AVASTIN) SOLN   NDC: 05/07/2020, Lot: 05/07/2020   Route: Intravitreal, Site: Right Eye, Waste: 0 mg Post-op Post injection exam found visual acuity of at  least counting fingers. The patient tolerated the procedure well. There were no complications. The patient received written and verbal post procedure care education.  Post injection medications were not given.   OCT, Retina - OU - Both Eyes  Result Date: 05/05/2020 Right Eye Central Foveal Thickness: 310. Progression has been stable. Findings include retinal drusen , choroidal neovascular membrane, cystoid macular edema, abnormal foveal contour. Left Eye Central Foveal Thickness: 343. Findings include abnormal foveal contour, subretinal fluid, pigment epithelial detachment. Notes OD with chronically active CN VM, CME are stable and stable acuity at the 11-week interval today repeat intravitreal Avastin today. 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   PAF (paroxysmal atrial fibrillation) (HCC) Heart rate is in control  Chronic constipation Stale, continue Colace, MiraLax.   Osteoporosis without current pathological fracture No recent fxs, Reclast in the past, continue Ca, Vit D, t score -2.1 2019  Hyperlipidemia LDL at goal, continue Atorvastatin  Memory deficit Functioning adequately in [facility], continue Memantine for memory.    Family/ staff Communication: plan of care reviewed with the patient and charge nurse.   Labs/tests ordered:  None  Time spend 40 minutes.

## 2020-05-29 ENCOUNTER — Encounter: Payer: Self-pay | Admitting: Nurse Practitioner

## 2020-05-29 NOTE — Assessment & Plan Note (Signed)
Stale, continue Colace, MiraLax.

## 2020-05-29 NOTE — Assessment & Plan Note (Signed)
Heart rate is in control.  

## 2020-05-29 NOTE — Assessment & Plan Note (Signed)
LDL at goal, continue Atorvastatin 

## 2020-05-29 NOTE — Assessment & Plan Note (Signed)
Functioning adequately in [facility], continue Memantine for memory.

## 2020-05-29 NOTE — Assessment & Plan Note (Signed)
Blood pressure is controlled, continue Amlodipine.  

## 2020-05-29 NOTE — Assessment & Plan Note (Addendum)
No recent fxs, Reclast in the past, continue Ca, Vit D, t score -2.1 2019

## 2020-06-03 ENCOUNTER — Emergency Department (HOSPITAL_COMMUNITY)
Admission: EM | Admit: 2020-06-03 | Discharge: 2020-06-03 | Disposition: A | Discharge status: Home / Self Care | Payer: Medicare PPO | Attending: Emergency Medicine | Admitting: Emergency Medicine

## 2020-06-03 ENCOUNTER — Encounter (HOSPITAL_COMMUNITY): Payer: Self-pay | Admitting: Pediatrics

## 2020-06-03 ENCOUNTER — Other Ambulatory Visit: Payer: Self-pay

## 2020-06-03 ENCOUNTER — Emergency Department (HOSPITAL_COMMUNITY): Payer: Medicare PPO

## 2020-06-03 DIAGNOSIS — I10 Essential (primary) hypertension: Secondary | ICD-10-CM | POA: Diagnosis not present

## 2020-06-03 DIAGNOSIS — J90 Pleural effusion, not elsewhere classified: Secondary | ICD-10-CM | POA: Diagnosis not present

## 2020-06-03 DIAGNOSIS — R42 Dizziness and giddiness: Secondary | ICD-10-CM | POA: Diagnosis present

## 2020-06-03 DIAGNOSIS — Z743 Need for continuous supervision: Secondary | ICD-10-CM | POA: Diagnosis not present

## 2020-06-03 DIAGNOSIS — R000 Tachycardia, unspecified: Secondary | ICD-10-CM | POA: Insufficient documentation

## 2020-06-03 DIAGNOSIS — R5381 Other malaise: Secondary | ICD-10-CM | POA: Diagnosis not present

## 2020-06-03 DIAGNOSIS — Z7982 Long term (current) use of aspirin: Secondary | ICD-10-CM | POA: Diagnosis not present

## 2020-06-03 DIAGNOSIS — F0390 Unspecified dementia without behavioral disturbance: Secondary | ICD-10-CM | POA: Insufficient documentation

## 2020-06-03 DIAGNOSIS — R457 State of emotional shock and stress, unspecified: Secondary | ICD-10-CM | POA: Diagnosis not present

## 2020-06-03 DIAGNOSIS — Z87891 Personal history of nicotine dependence: Secondary | ICD-10-CM | POA: Diagnosis not present

## 2020-06-03 DIAGNOSIS — I499 Cardiac arrhythmia, unspecified: Secondary | ICD-10-CM | POA: Diagnosis not present

## 2020-06-03 DIAGNOSIS — R4182 Altered mental status, unspecified: Secondary | ICD-10-CM | POA: Diagnosis not present

## 2020-06-03 DIAGNOSIS — Z79899 Other long term (current) drug therapy: Secondary | ICD-10-CM | POA: Insufficient documentation

## 2020-06-03 DIAGNOSIS — R279 Unspecified lack of coordination: Secondary | ICD-10-CM | POA: Diagnosis not present

## 2020-06-03 LAB — CBC WITH DIFFERENTIAL/PLATELET
Abs Immature Granulocytes: 0.03 10*3/uL (ref 0.00–0.07)
Basophils Absolute: 0 10*3/uL (ref 0.0–0.1)
Basophils Relative: 1 %
Eosinophils Absolute: 0.1 10*3/uL (ref 0.0–0.5)
Eosinophils Relative: 1 %
HCT: 39.2 % (ref 36.0–46.0)
Hemoglobin: 13.2 g/dL (ref 12.0–15.0)
Immature Granulocytes: 1 %
Lymphocytes Relative: 13 %
Lymphs Abs: 0.8 10*3/uL (ref 0.7–4.0)
MCH: 31.7 pg (ref 26.0–34.0)
MCHC: 33.7 g/dL (ref 30.0–36.0)
MCV: 94.2 fL (ref 80.0–100.0)
Monocytes Absolute: 0.3 10*3/uL (ref 0.1–1.0)
Monocytes Relative: 5 %
Neutro Abs: 5.3 10*3/uL (ref 1.7–7.7)
Neutrophils Relative %: 79 %
Platelets: 266 10*3/uL (ref 150–400)
RBC: 4.16 MIL/uL (ref 3.87–5.11)
RDW: 13.2 % (ref 11.5–15.5)
WBC: 6.5 10*3/uL (ref 4.0–10.5)
nRBC: 0 % (ref 0.0–0.2)

## 2020-06-03 LAB — CBG MONITORING, ED: Glucose-Capillary: 96 mg/dL (ref 70–99)

## 2020-06-03 LAB — COMPREHENSIVE METABOLIC PANEL
ALT: 19 U/L (ref 0–44)
AST: 26 U/L (ref 15–41)
Albumin: 4.1 g/dL (ref 3.5–5.0)
Alkaline Phosphatase: 51 U/L (ref 38–126)
Anion gap: 11 (ref 5–15)
BUN: 13 mg/dL (ref 8–23)
CO2: 21 mmol/L — ABNORMAL LOW (ref 22–32)
Calcium: 9.8 mg/dL (ref 8.9–10.3)
Chloride: 99 mmol/L (ref 98–111)
Creatinine, Ser: 0.59 mg/dL (ref 0.44–1.00)
GFR calc Af Amer: >60 mL/min (ref >60)
GFR calc non Af Amer: >60 mL/min (ref >60)
Glucose, Bld: 107 mg/dL — ABNORMAL HIGH (ref 70–99)
Potassium: 3.7 mmol/L (ref 3.5–5.1)
Sodium: 131 mmol/L — ABNORMAL LOW (ref 135–145)
Total Bilirubin: 0.9 mg/dL (ref 0.3–1.2)
Total Protein: 7.1 g/dL (ref 6.5–8.1)

## 2020-06-03 LAB — URINALYSIS, ROUTINE W REFLEX MICROSCOPIC
Bilirubin Urine: NEGATIVE
Glucose, UA: NEGATIVE mg/dL
Hgb urine dipstick: NEGATIVE
Ketones, ur: 5 mg/dL — AB
Leukocytes,Ua: NEGATIVE
Nitrite: NEGATIVE
Protein, ur: NEGATIVE mg/dL
Specific Gravity, Urine: 1.006 (ref 1.005–1.030)
pH: 8 (ref 5.0–8.0)

## 2020-06-03 LAB — TSH: TSH: 2.703 u[IU]/mL (ref 0.350–4.500)

## 2020-06-03 LAB — TROPONIN I (HIGH SENSITIVITY)
Troponin I (High Sensitivity): 5 ng/L (ref <18)
Troponin I (High Sensitivity): 5 ng/L (ref <18)

## 2020-06-03 MED ORDER — SODIUM CHLORIDE 0.9% FLUSH: 3.0000 mL | Freq: Once | INTRAVENOUS | Status: DC

## 2020-06-03 MED ORDER — SODIUM CHLORIDE 0.9 % IV BOLUS
1000.0000 mL | Freq: Once | INTRAVENOUS | Status: AC
  Administered 2020-06-03: 1000 mL via INTRAVENOUS

## 2020-06-03 NOTE — Discharge Instructions (Addendum)
Candice Willis presented with tachycardia and a heart rate of 120 bpm.  It was not clear whether this might be her paroxysmal A Fib.  However while here she spontaneously converted back to sinus rhythm, with a heart rate of 80.  I felt it was safe to discharge her.  She should follow up with her cardiologist in the clinic (the number is above).  She can continue her baby aspirin.  I did not want to initiate stronger blood thinners or beta blockers with her age, frailty, and fall risks.  If her heart rate goes up again above 110 bpm, please contact her clinical doctor.  Otherwise her workup was unremarkable.  There was no sign of infection in her urine.  I've included her labs on her discharge.

## 2020-06-03 NOTE — ED Triage Notes (Signed)
Patient arrived to ED from [facility]; staff reported patient had an episode of "yelling and shaking" while in the dining room. Denies LOC. Reported patient had leg and torso pain, denies it now. Patient in ED room 16 awake and oriented, hard of hearing but answers appropriately.

## 2020-06-03 NOTE — ED Notes (Signed)
Ptar called by dee pt is number 6 it will be about a hour

## 2020-06-03 NOTE — ED Notes (Signed)
Patient verbalizes understanding of discharge instructions. Opportunity for questioning and answers were provided. Armband removed by staff, pt discharged from ED via PTAR.  

## 2020-06-03 NOTE — ED Provider Notes (Signed)
[facility] EMERGENCY DEPARTMENT Provider Note   CSN: [id number] Arrival date & time: 06/03/20  5053     History CC: Dizziness  Candice Willis is a 86 y.o. female with a history of dementia, suspected paroxysmal A. Fib ("no definitive diagnosis" reported in caridology & PCP notes, takes aspirin 81 mg only, no other A/C), presented to emergency department with dizziness and shaking.  [facility].   Pt presents from [facility] today after reportedly having an episode of yelling and shaking in the dining room.  Here in the ED the patient is calm but demented, cannot remember anything that happened today.  She tells me she feels "dizzy" but denies chest pain or pressure.  She is hard of hearing.  HPI     Past Medical History:  Diagnosis Date  . Arteriosclerotic cardiovascular disease   . Bee sting reaction 08/23/2016   Tongue swelling and difficulty swallowing /notes 08/23/2016  . Constipation   . Degenerative joint disease of right hip 10/15/14  . Fracture of multiple pubic rami (HCC) 10/15/14   Right inferior and superior pubic rami  . HOH (hard of hearing)    Wears bilateral hearing aids  . Hyperlipidemia   . Hypertension   . Macular degeneration   . Memory deficit   . Osteoporosis   . Pain in right foot   . Palpitations   . Paroxysmal atrial fibrillation (HCC)   . Thyroid nodule    Status post surgery  . Ulcer of hard palate   . Vertigo     Patient Active Problem List   Diagnosis Date Noted  . Dysphagia 05/28/2020  . Exudative age-related macular degeneration of right eye with active choroidal neovascularization (HCC) 05/05/2020  . Cystoid macular edema of right eye 05/05/2020  . Exudative age-related macular degeneration of left eye with inactive choroidal neovascularization (HCC) 05/05/2020  . Acute kidney injury (HCC) 03/09/2020  . Shingles 03/04/2020  . Lung  nodule 01/22/2019  . Diarrhea 01/21/2019  . Ingestion of corrosive chemical 01/21/2019  . Advanced care planning/counseling discussion 05/09/2018  . Incontinent of urine 02/26/2018  . Urinary frequency 02/19/2018  . Generalized weakness 02/19/2018  . Benign paroxysmal positional vertigo 01/02/2018  . Osteoarthritis of right hip 01/02/2018  . Gait abnormality 12/21/2016  . Anemia, iron deficiency 12/21/2016  . Macular degeneration of both eyes 09/08/2016  . Sensorineural hearing loss (SNHL) of both ears 09/08/2016  . PVC's (premature ventricular contractions) 01/05/2016  . Hyponatremia 09/13/2015  . PAF (paroxysmal atrial fibrillation) (HCC) 09/11/2015  . Vitamin D deficiency 10/24/2014  . Memory deficit   . Thyroid nodule   . Hyperlipidemia 10/20/2014  . Essential hypertension 10/20/2014  . Osteoporosis without current pathological fracture 10/20/2014  . Chronic constipation 10/20/2014    Past Surgical History:  Procedure Laterality Date  . ABDOMINAL HYSTERECTOMY  1968  . APPENDECTOMY  1940's  . COLONOSCOPY    . FOOT SURGERY Left   . HARDWARE REMOVAL Right 02/16/2016   Procedure: HARDWARE REMOVAL RT HIP;  Surgeon: Sheral Apley, MD;  Location: [facility] OR;  Service: Orthopedics;  Laterality: Right;  . HIP ARTHROPLASTY Right 02/16/2016   Procedure: ARTHROPLASTY BIPOLAR HIP (HEMIARTHROPLASTY);  Surgeon: Sheral Apley, MD;  Location: [facility] OR;  Service: Orthopedics;  Laterality: Right;  . INTRAMEDULLARY (IM) NAIL INTERTROCHANTERIC Right 09/11/2015   Procedure: INTRAMEDULLARY (IM) NAIL INTERTROCHANTRIC;  Surgeon: Sheral Apley, MD;  Location: [facility] OR;  Service:  Orthopedics;  Laterality: Right;  . KNEE SURGERY       OB History   No obstetric history on file.     Family History  Problem Relation Age of Onset  . Heart disease Father   . Cancer Father        prostate  . Cancer Maternal Grandmother        colon  . Heart disease Paternal Grandmother   . Hyperlipidemia Sister     . Cancer Sister        breast  . Heart disease Brother   . Hyperlipidemia Brother   . Cancer Brother        prostate    Social History   Tobacco Use  . Smoking status: Former Smoker    Packs/day: 0.50    Types: Cigarettes    Quit date: 10/05/1971    Years since quitting: 48.6  . Smokeless tobacco: Never Used  Vaping Use  . Vaping Use: Never used  Substance Use Topics  . Alcohol use: Yes    Comment: 1-4 per week; eats 10 raisins daily soaked in Gin  . Drug use: No    Home Medications Prior to Admission medications   Medication Sig Start Date End Date Taking? Authorizing Provider  amLODipine (NORVASC) 5 MG tablet Take 5 mg by mouth daily.   Yes [provider]  atorvastatin (LIPITOR) 10 MG tablet Take 10 mg by mouth daily.   Yes [provider]  Biotin 25852 MCG TABS Take 10,000 mcg by mouth daily.    Yes [provider]  calcium carbonate (OSCAL) 1500 (600 Ca) MG TABS tablet Take 600 mg of elemental calcium by mouth 2 (two) times daily with a meal.   Yes [provider]  Cholecalciferol (VITAMIN D3) 2000 units TABS Take 2,000 Units by mouth daily.   Yes [provider]  docusate sodium (COLACE) 100 MG capsule Take 100 mg by mouth 2 (two) times daily.    Yes [provider]  EPINEPHrine 0.3 mg/0.3 mL IJ SOAJ injection Inject 0.3 mLs (0.3 mg total) into the muscle once as needed (anaphylaxis/allergic reaction). 08/24/16  Yes Diallo, Abdoulaye, MD  lactose free nutrition (BOOST) LIQD Take 237 mLs by mouth. Once a day at breakfast   Yes [provider]  meclizine (ANTIVERT) 12.5 MG tablet Take 12.5 mg by mouth daily as needed for dizziness.    Yes [provider]  Melatonin 3 MG TABS Take 3 mg by mouth at bedtime as needed (sleep).   Yes [provider]  memantine (NAMENDA) 10 MG tablet Take 10 mg by mouth 2 (two) times daily.    Yes [provider]  Multiple Vitamins-Minerals (PRESERVISION  AREDS) TABS Take 2 tablets by mouth daily.   Yes [provider]  Multiple Vitamins-Minerals (THEREMS-M) TABS Take 1 tablet by mouth daily.   Yes [provider]  polyethylene glycol (MIRALAX / GLYCOLAX) packet Take 17 g by mouth every other day.   Yes [provider]  acetaminophen (TYLENOL) 500 MG tablet Take 500 mg by mouth every 6 (six) hours as needed.    [provider]    Allergies    Patient has no known allergies.  Review of Systems   Review of Systems  Unable to perform ROS: Dementia (level 5 caveat)    Physical Exam Updated Vital Signs BP (!) 173/72   Pulse 77   Temp 98 F (36.7 C) (Oral)   Resp 16   Ht 5'  5" (1.651 m)   Wt 55.5 kg   SpO2 97%   BMI 20.37 kg/m   Physical Exam Vitals and nursing note reviewed.  Constitutional:      General: She is not in acute distress.    Appearance: She is well-developed.  HENT:     Head: Normocephalic and atraumatic.  Eyes:     Conjunctiva/sclera: Conjunctivae normal.     Pupils: Pupils are equal, round, and reactive to light.  Cardiovascular:     Rate and Rhythm: Tachycardia present. Rhythm irregular.     Pulses: Normal pulses.  Pulmonary:     Effort: Pulmonary effort is normal. No respiratory distress.     Breath sounds: Normal breath sounds.  Abdominal:     Palpations: Abdomen is soft.     Tenderness: There is no abdominal tenderness.  Musculoskeletal:     Cervical back: Neck supple.  Skin:    General: Skin is warm and dry.  Neurological:     General: No focal deficit present.     Mental Status: She is alert.     ED Results / Procedures / Treatments   Labs (all labs ordered are listed, but only abnormal results are displayed) Labs Reviewed  COMPREHENSIVE METABOLIC PANEL - Abnormal; Notable for the following components:      Result Value   Sodium 131 (*)    CO2 21 (*)    Glucose, Bld 107 (*)    All other components within normal limits  URINALYSIS, ROUTINE W REFLEX  MICROSCOPIC - Abnormal; Notable for the following components:   Color, Urine STRAW (*)    Ketones, ur 5 (*)    All other components within normal limits  TSH  CBC WITH DIFFERENTIAL/PLATELET  CBG MONITORING, ED  TROPONIN I (HIGH SENSITIVITY)  TROPONIN I (HIGH SENSITIVITY)    EKG EKG Interpretation  Date/Time:  Wednesday June 03 2020 13:18:07 EDT Ventricular Rate:  79 PR Interval:    QRS Duration: 86 QT Interval:  402 QTC Calculation: 461 R Axis:   70 Text Interpretation: Sinus rhythm Prolonged PR interval Anteroseptal infarct, old No STEMI Confirmed by Alvester Chou [phone]) on 06/03/2020 1:47:16 PM   Radiology DG Chest Portable 1 View  Result Date: 06/03/2020 CLINICAL DATA:  episode of "yelling and shaking" while in the dining room. EXAM: PORTABLE CHEST 1 VIEW COMPARISON:  01/21/2019 FINDINGS: 1037 hours. Lungs are hyperexpanded. The lungs are clear without focal pneumonia, edema, pneumothorax or pleural effusion. Interstitial markings are diffusely coarsened with chronic features. The cardiopericardial silhouette is within normal limits for size. The visualized bony structures of the thorax show now acute abnormality. Telemetry leads overlie the chest. IMPRESSION: Chronic interstitial changes.  No acute findings. Electronically Signed   By: Kennith Center M.D.   On: 06/03/2020 10:54    Procedures Procedures (including critical care time)  Medications Ordered in ED Medications  sodium chloride flush (NS) 0.9 % injection 3 mL (has no administration in time range)  sodium chloride 0.9 % bolus 1,000 mL (0 mLs Intravenous Stopped 06/03/20 1319)    ED Course  I have reviewed the triage vital signs and the nursing notes.  Pertinent labs & imaging results that were available during my care of the patient were reviewed by me and considered in my medical decision making (see chart for details).  86 yo female presenting with "lightheadedness" and shaking episode today.  Here she is  tachycardic and found to have a rhythm of accelerated junctional tachycardia on ECG.  After 60  minutes in the ED, this rhythm spontaneously returned to a clear NSR in HR 80's.    She has received both covid vaccines per her printout at bedside  Labs personally reviewed with TSH 2.7, Trop flat on repeat (5 -> 5), UA negative for infection, BMP largely unremarkable (only mild hypoNa, K is normal 3.7)  DG chest reviewed with no focal consolidations noted.  Doubtful of PNA, UTI, or sepsis clinically.  I suspect she may have experienced an arrhythmia, although it's not clear that this is A. Fib.   I would not initiate new medications given her age, frailty, and fall-risk. I think continuing aspirin is reasonable.  She can f/u with cardiology.  Clinical Course as of Jun 03 1741  Wed Jun 03, 2020  1316 Pt appears to be back in NSR with rate 80.  She was napping but tells me she feels "fine" now. With this presentation, and her age, dementia and frailty, I would not initiate AC or beta blockers at this time.  I'll recommend another cardiology evaluation for her facility to help arrange, and [facility]   [MT]    Clinical Course User Index [MT] Jammy Stlouis, Kermit BaloMatthew J, MD    Final Clinical Impression(s) / ED Diagnoses Final diagnoses:  Tachycardia    Rx / DC Orders ED Discharge Orders    None       Terald Sleeperrifan, Dickie Cloe J, MD 06/03/20 1742

## 2020-06-04 ENCOUNTER — Encounter: Payer: Self-pay | Admitting: Nurse Practitioner

## 2020-06-04 ENCOUNTER — Non-Acute Institutional Stay: Payer: Medicare PPO | Admitting: Nurse Practitioner

## 2020-06-04 DIAGNOSIS — E871 Hypo-osmolality and hyponatremia: Secondary | ICD-10-CM | POA: Diagnosis not present

## 2020-06-04 DIAGNOSIS — M810 Age-related osteoporosis without current pathological fracture: Secondary | ICD-10-CM

## 2020-06-04 DIAGNOSIS — J849 Interstitial pulmonary disease, unspecified: Secondary | ICD-10-CM

## 2020-06-04 DIAGNOSIS — I480 Paroxysmal atrial fibrillation: Secondary | ICD-10-CM

## 2020-06-04 DIAGNOSIS — I10 Essential (primary) hypertension: Secondary | ICD-10-CM

## 2020-06-04 DIAGNOSIS — E782 Mixed hyperlipidemia: Secondary | ICD-10-CM | POA: Diagnosis not present

## 2020-06-04 DIAGNOSIS — K5909 Other constipation: Secondary | ICD-10-CM

## 2020-06-04 DIAGNOSIS — R413 Other amnesia: Secondary | ICD-10-CM

## 2020-06-04 DIAGNOSIS — R4182 Altered mental status, unspecified: Secondary | ICD-10-CM

## 2020-06-04 NOTE — Assessment & Plan Note (Addendum)
Heart rate is in control, s/p ED, will continue ASA '81mg'$  qd, f/u Cardiology 06/08/20 ED 06/03/20 for tachycardia junctional vs AFib, vent rate 124 bpm, resolved in ED, unreamarkable CBC/diff, CMP/eGFR, TSH , troponin, I, UA negative UTI, CXR showed chronic interstitial changes.

## 2020-06-04 NOTE — Progress Notes (Signed)
Location:   [facility] Room Number: 782 Place of Service:  ALF (13) Provider: Marlana Latus NP  Worley Radermacher X, NP  Patient Care Team: Marvella Jenning X, NP as PCP - General (Internal Medicine) Nahser, Wonda Cheng, MD as PCP - Cardiology (Cardiology) Melina Schools, MD as Consulting Physician (Orthopedic Surgery) Leonie Jennice Renegar, MD as Consulting Physician (Cardiology) Xane Amsden X, NP as Nurse Practitioner (Internal Medicine)  Extended Emergency Contact Information Primary Emergency Contact: [name],Lugene Address: [address] [place] [address] Johnnette Litter of [place] Phone: [phone] Mobile Phone: [phone] Relation: Niece Secondary Emergency Contact: [name],[name]  [place] Mobile Phone: [phone] Relation: Niece  Code Status: DNR Goals of care: Advanced Directive information Advanced Directives 06/03/2020  Does Patient Have a Medical Advance Directive? Yes  Type of Advance Directive Out of facility DNR (pink MOST or yellow form)  Does patient want to make changes to medical advance directive? No - Patient declined  Copy of [place] in Chart? -  Would patient like information on creating a medical advance directive? No - Patient declined  Pre-existing out of facility DNR order (yellow form or pink MOST form) Pink MOST/Yellow Form most recent copy in chart - Physician notified to receive inpatient order     Chief Complaint  Patient presents with  . Acute Visit    Follow up ED evaluation    HPI:  Pt is a 86 y.o. female seen today for an acute visit for ED eval  ED 06/03/20 for tachycardia junctional vs AFib, vent rate 124 bpm, resolved in ED, unreamarkable CBC/diff, CMP/eGFR, TSH , troponin, I, UA negative UTI, CXR showed chronic interstitial changes. The patient presented to ED with dizziness, shaking, yelling, while in dinning room for breakfast which all resolved upon arrival ED.   The patient  resides in [facility] for safety, care assistance, on Memantine '10mg'$  bid for memory.              HTN, blood pressure is controlled on Amlodipine '5mg'$  qd.             Hyperlipidemia, takes Atorvastatin '10mg'$  qd.              OP, takes Ca, Vit D             Constipation,takes Colace bid, MiraLax qod.   Hyponatremia, 06/03/20 Na 131 at baseline.    Past Medical History:  Diagnosis Date  . Arteriosclerotic cardiovascular disease   . Bee sting reaction 08/23/2016   Tongue swelling and difficulty swallowing /notes 08/23/2016  . Constipation   . Degenerative joint disease of right hip 10/15/14  . Fracture of multiple pubic rami (HCC) 10/15/14   Right inferior and superior pubic rami  . HOH (hard of hearing)    Wears bilateral hearing aids  . Hyperlipidemia   . Hypertension   . Macular degeneration   . Memory deficit   . Osteoporosis   . Pain in right foot   . Palpitations   . Paroxysmal atrial fibrillation (HCC)   . Thyroid nodule    Status post surgery  . Ulcer of hard palate   . Vertigo    Past Surgical History:  Procedure Laterality Date  . ABDOMINAL HYSTERECTOMY  1968  . APPENDECTOMY  1940's  . COLONOSCOPY    . FOOT SURGERY Left   . HARDWARE REMOVAL Right 02/16/2016   Procedure: HARDWARE REMOVAL RT  HIP;  Surgeon: Renette Butters, MD;  Location: [facility];  Service: Orthopedics;  Laterality: Right;  . HIP ARTHROPLASTY Right 02/16/2016   Procedure: ARTHROPLASTY BIPOLAR HIP (HEMIARTHROPLASTY);  Surgeon: Renette Butters, MD;  Location: [facility];  Service: Orthopedics;  Laterality: Right;  . INTRAMEDULLARY (IM) NAIL INTERTROCHANTERIC Right 09/11/2015   Procedure: INTRAMEDULLARY (IM) NAIL INTERTROCHANTRIC;  Surgeon: Renette Butters, MD;  Location: [facility];  Service: Orthopedics;  Laterality: Right;  . KNEE SURGERY      No Known Allergies  Allergies as of 06/04/2020   No Known Allergies     Medication List       Accurate as of June 04, 2020 11:59 PM. If you have any questions, ask your  nurse or doctor.        STOP taking these medications   acetaminophen 500 MG tablet Commonly known as: TYLENOL Stopped by: Lorene Samaan X Zaryan Yakubov, NP     TAKE these medications   amLODipine 5 MG tablet Commonly known as: NORVASC Take 5 mg by mouth daily.   atorvastatin 10 MG tablet Commonly known as: LIPITOR Take 10 mg by mouth daily.   Biotin 10000 MCG Tabs Take 10,000 mcg by mouth daily.   calcium carbonate 1500 (600 Ca) MG Tabs tablet Commonly known as: OSCAL Take 600 mg of elemental calcium by mouth 2 (two) times daily with a meal.   docusate sodium 100 MG capsule Commonly known as: COLACE Take 100 mg by mouth 2 (two) times daily.   EPINEPHrine 0.3 mg/0.3 mL Soaj injection Commonly known as: EPI-PEN Inject 0.3 mLs (0.3 mg total) into the muscle once as needed (anaphylaxis/allergic reaction).   lactose free nutrition Liqd Take 237 mLs by mouth. Once a day at breakfast   meclizine 12.5 MG tablet Commonly known as: ANTIVERT Take 12.5 mg by mouth daily as needed for dizziness.   melatonin 3 MG Tabs tablet Take 3 mg by mouth at bedtime as needed (sleep).   memantine 10 MG tablet Commonly known as: NAMENDA Take 10 mg by mouth 2 (two) times daily.   polyethylene glycol 17 g packet Commonly known as: MIRALAX / GLYCOLAX Take 17 g by mouth every other day.   Therems-M Tabs Take 1 tablet by mouth daily.   PreserVision AREDS Tabs Take 2 tablets by mouth daily.   Vitamin D3 50 MCG (2000 UT) Tabs Take 2,000 Units by mouth daily.       Review of Systems  Constitutional: Negative for appetite change, fatigue and fever.  HENT: Positive for hearing loss. Negative for congestion and voice change.        Hears better left ear  Respiratory: Negative for cough and shortness of breath.   Cardiovascular: Negative for leg swelling.  Gastrointestinal: Negative for abdominal pain, constipation, nausea and vomiting.  Genitourinary: Negative for difficulty urinating, dysuria and  urgency.  Musculoskeletal: Positive for gait problem.  Skin: Negative for color change.  Neurological: Positive for dizziness. Negative for tremors, syncope, speech difficulty, weakness, light-headedness and headaches.       Memory lapses. Chronic on and off dizziness  Psychiatric/Behavioral: Positive for confusion. Negative for behavioral problems and sleep disturbance. The patient is not nervous/anxious.     Immunization History  Administered Date(s) Administered  . Influenza Whole 08/25/2018  . Influenza, High Dose Seasonal PF 08/24/2019  . Influenza-Unspecified 09/07/2016, 09/12/2017  . Moderna SARS-COVID-2 Vaccination 11/23/2019, 12/21/2019  . PPD Test 02/18/2016  . Pneumococcal Conjugate-13 09/18/2017  . Pneumococcal-Unspecified 10/22/2018  . Tdap 10/07/2012  Pertinent  Health Maintenance Due  Topic Date Due  . INFLUENZA VACCINE  06/21/2020  . DEXA SCAN  Completed  . PNA vac Low Risk Adult  Completed   Fall Risk  08/17/2018 08/03/2017 10/24/2014  Falls in the past year? No No Yes  Number falls in past yr: - - 1  Injury with Fall? - - Yes  Risk Factor Category  - - High Fall Risk  Risk for fall due to : - - History of fall(s)   Functional Status Survey:    Vitals:   06/04/20 1444  BP: 140/72  Pulse: 78  Resp: 20  Temp: (!) 97.2 F (36.2 C)  SpO2: 95%  Weight: 122 lb 6.4 oz (55.5 kg)  Height: '5\' 5"'$  (1.651 m)   Body mass index is 20.37 kg/m. Physical Exam Vitals and nursing note reviewed.  Constitutional:      Appearance: Normal appearance. She is normal weight.  HENT:     Head: Normocephalic and atraumatic.     Nose: Nose normal.     Mouth/Throat:     Mouth: Mucous membranes are moist.  Eyes:     Extraocular Movements: Extraocular movements intact.     Conjunctiva/sclera: Conjunctivae normal.     Pupils: Pupils are equal, round, and reactive to light.  Cardiovascular:     Rate and Rhythm: Normal rate and regular rhythm.     Heart sounds: No murmur  heard.   Pulmonary:     Effort: Pulmonary effort is normal.     Breath sounds: No wheezing, rhonchi or rales.  Abdominal:     General: Bowel sounds are normal.     Palpations: Abdomen is soft.     Tenderness: There is no abdominal tenderness. There is no right CVA tenderness, left CVA tenderness, guarding or rebound.  Musculoskeletal:     Cervical back: Normal range of motion and neck supple.     Right lower leg: No edema.     Left lower leg: No edema.  Skin:    General: Skin is warm and dry.  Neurological:     General: No focal deficit present.     Mental Status: She is alert. Mental status is at baseline.     Gait: Gait abnormal.     Comments: Oriented to person, place.   Psychiatric:        Mood and Affect: Mood normal.        Behavior: Behavior normal.        Thought Content: Thought content normal.     Labs reviewed: Recent Labs    03/09/20 0000 03/17/20 0000 06/03/20 0952  NA 131* 131* 131*  K 4.4 4.1 3.7  CL 101 100 99  CO2 22 24* 21*  GLUCOSE  --   --  107*  BUN '18 13 13  '$ CREATININE 0.9 0.7 0.59  CALCIUM 10.0 10.2 9.8   Recent Labs    06/25/19 0000 03/05/20 0000 06/03/20 0952  AST '20 20 26  '$ ALT '14 13 19  '$ ALKPHOS 42 54 51  BILITOT  --   --  0.9  PROT 6.7  --  7.1  ALBUMIN 4.1 4.0 4.1   Recent Labs    06/25/19 0000 03/05/20 0000 06/03/20 1022  WBC 5.4 5.8 6.5  NEUTROABS 3,483 818 5.3  HGB 12.4 13.5 13.2  HCT 35* 39 39.2  MCV  --   --  94.2  PLT 300 254 266   Lab Results  Component Value Date   TSH  2.703 06/03/2020   Lab Results  Component Value Date   HGBA1C 5.4 03/14/2018   Lab Results  Component Value Date   CHOL 183 03/05/2020   HDL 70 03/05/2020   LDLCALC 94 03/05/2020   TRIG 97 03/05/2020   CHOLHDL 2.6 03/14/2018    Significant Diagnostic Results in last 30 days:  DG Chest Portable 1 View  Result Date: 06/03/2020 CLINICAL DATA:  episode of "yelling and shaking" while in the dining room. EXAM: PORTABLE CHEST 1 VIEW  COMPARISON:  01/21/2019 FINDINGS: 1037 hours. Lungs are hyperexpanded. The lungs are clear without focal pneumonia, edema, pneumothorax or pleural effusion. Interstitial markings are diffusely coarsened with chronic features. The cardiopericardial silhouette is within normal limits for size. The visualized bony structures of the thorax show now acute abnormality. Telemetry leads overlie the chest. IMPRESSION: Chronic interstitial changes.  No acute findings. Electronically Signed   By: Misty Stanley M.D.   On: 06/03/2020 10:54    Assessment/Plan: PAF (paroxysmal atrial fibrillation) (HCC) Heart rate is in control, s/p ED, will continue ASA '81mg'$  qd, f/u Cardiology 06/08/20 ED 06/03/20 for tachycardia junctional vs AFib, vent rate 124 bpm, resolved in ED, unreamarkable CBC/diff, CMP/eGFR, TSH , troponin, I, UA negative UTI, CXR showed chronic interstitial changes.    Chronic constipation Stable, continue Colace, MiraLax.   Essential hypertension Blood pressure is controlled, continue Amlodipine.   Hyponatremia At her baseline 131 in ED  Memory deficit No behavioral issues, continue [facility], continue Memantine.   Hyperlipidemia Continue Atorvastatin. LDL at goal.   Osteoporosis without current pathological fracture No recent fxs, continue Ca, Vit D  Interstitial lung disease ([place]) Identified on CXR in ED 06/03/20  Altered mental status The patient presented to ED with dizziness, shaking, yelling, while in dinning room for breakfast which all resolved upon arrival ED.  May consider head CT, CTA, or brain MRI if recurs of the above symptoms.     Family/ staff Communication: plan of care reviewed with the patient and charge nurse.   Labs/tests ordered:  None  Time spend 40 minutes.

## 2020-06-05 ENCOUNTER — Encounter: Payer: Self-pay | Admitting: Nurse Practitioner

## 2020-06-05 DIAGNOSIS — R4182 Altered mental status, unspecified: Secondary | ICD-10-CM | POA: Insufficient documentation

## 2020-06-05 DIAGNOSIS — J849 Interstitial pulmonary disease, unspecified: Secondary | ICD-10-CM | POA: Insufficient documentation

## 2020-06-05 NOTE — Assessment & Plan Note (Signed)
The patient presented to ED with dizziness, shaking, yelling, while in dinning room for breakfast which all resolved upon arrival ED.  May consider head CT, CTA, or brain MRI if recurs of the above symptoms.

## 2020-06-05 NOTE — Assessment & Plan Note (Signed)
No behavioral issues, continue [facility], continue Memantine.

## 2020-06-05 NOTE — Assessment & Plan Note (Signed)
Identified on CXR in ED 06/03/20

## 2020-06-05 NOTE — Assessment & Plan Note (Signed)
Stable, continue Colace, MiraLax.  

## 2020-06-05 NOTE — Assessment & Plan Note (Signed)
Blood pressure is controlled, continue Amlodipine.  

## 2020-06-05 NOTE — Assessment & Plan Note (Signed)
At her baseline 131 in ED

## 2020-06-05 NOTE — Assessment & Plan Note (Signed)
Continue Atorvastatin. LDL at goal.

## 2020-06-05 NOTE — Assessment & Plan Note (Signed)
No recent fxs, continue Ca, Vit D

## 2020-06-07 ENCOUNTER — Encounter: Payer: Self-pay | Admitting: Cardiovascular Disease

## 2020-06-07 NOTE — Progress Notes (Signed)
Cardiology Office Note   Date:  06/08/2020   ID:  Candice Willis, DOB 08-09-1924, MRN [id number]  PCP:  Mast, Man X, NP  Cardiologist:   Kristeen Miss, MD   Chief Complaint  Patient presents with   Hypertension   Problem list 1.  History of hip fracture 2. Premature ventricular contractions 3. Essential hypertension 4. Hyperlipidemia 5. PVCs 6. Hx of paroxysmal atrial fib    History of Present Illness: Candice Willis is a 86 y.o. female who presents for preoperative evaluation prior to having hip surgery. She fell and broke her right hip on oct. 2016. Had no cardiac complications with that surgery .   Has a hx of PVCs ( documeneted by ECG )   June 08, 2020:  [name] is seen today for follow up of her PVCs She was last seen in 2017 She was recently in the ER for dizziness and shaking  ECG showed accelerated junctional rhythm She converted to NSR while in the ER   She has a hx of dementia and has become quite limited over the past years.  Extremely hard of hearing .  Very difficult to talk to here in the office.    Past Medical History:  Diagnosis Date   Arteriosclerotic cardiovascular disease    Bee sting reaction 08/23/2016   Tongue swelling and difficulty swallowing /notes 08/23/2016   Constipation    Degenerative joint disease of right hip 10/15/14   Fracture of multiple pubic rami (HCC) 10/15/14   Right inferior and superior pubic rami   HOH (hard of hearing)    Wears bilateral hearing aids   Hyperlipidemia    Hypertension    Macular degeneration    Memory deficit    Osteoporosis    Pain in right foot    Palpitations    Paroxysmal atrial fibrillation (HCC)    Thyroid nodule    Status post surgery   Ulcer of hard palate    Vertigo     Past Surgical History:  Procedure Laterality Date   ABDOMINAL HYSTERECTOMY  1968   APPENDECTOMY  1940's   COLONOSCOPY     FOOT SURGERY Left    HARDWARE REMOVAL Right 02/16/2016    Procedure: HARDWARE REMOVAL RT HIP;  Surgeon: Sheral Apley, MD;  Location: [facility] OR;  Service: Orthopedics;  Laterality: Right;   HIP ARTHROPLASTY Right 02/16/2016   Procedure: ARTHROPLASTY BIPOLAR HIP (HEMIARTHROPLASTY);  Surgeon: Sheral Apley, MD;  Location: [facility] OR;  Service: Orthopedics;  Laterality: Right;   INTRAMEDULLARY (IM) NAIL INTERTROCHANTERIC Right 09/11/2015   Procedure: INTRAMEDULLARY (IM) NAIL INTERTROCHANTRIC;  Surgeon: Sheral Apley, MD;  Location: [facility] OR;  Service: Orthopedics;  Laterality: Right;   KNEE SURGERY       Current Outpatient Medications  Medication Sig Dispense Refill   aspirin EC 81 MG tablet Take 81 mg by mouth daily. Swallow whole.     atorvastatin (LIPITOR) 10 MG tablet Take 10 mg by mouth daily.     Biotin 16967 MCG TABS Take 10,000 mcg by mouth daily.      calcium carbonate (OSCAL) 1500 (600 Ca) MG TABS tablet Take 600 mg of elemental calcium by mouth 2 (two) times daily with a meal.     Cholecalciferol (VITAMIN D3) 2000 units TABS Take 2,000 Units by mouth daily.     docusate sodium (COLACE) 100 MG capsule Take 100 mg by mouth 2 (two) times daily.      EPINEPHrine 0.3 mg/0.3 mL IJ SOAJ injection  Inject 0.3 mLs (0.3 mg total) into the muscle once as needed (anaphylaxis/allergic reaction). 2 Device 0   lactose free nutrition (BOOST) LIQD Take 237 mLs by mouth. Once a day at breakfast     meclizine (ANTIVERT) 12.5 MG tablet Take 12.5 mg by mouth daily as needed for dizziness.      Melatonin 3 MG TABS Take 3 mg by mouth at bedtime as needed (sleep).     memantine (NAMENDA) 10 MG tablet Take 10 mg by mouth 2 (two) times daily.      Multiple Vitamins-Minerals (PRESERVISION AREDS) TABS Take 2 tablets by mouth daily.     Multiple Vitamins-Minerals (THEREMS-M) TABS Take 1 tablet by mouth daily.     polyethylene glycol (MIRALAX / GLYCOLAX) packet Take 17 g by mouth every other day.     diltiazem (CARDIZEM CD) 180 MG 24 hr capsule Take 1  capsule (180 mg total) by mouth daily. 90 capsule 3   No current facility-administered medications for this visit.    Allergies:   Patient has no known allergies.    Social History:  The patient  reports that she quit smoking about 48 years ago. Her smoking use included cigarettes. She smoked 0.50 packs per day. She has never used smokeless tobacco. She reports current alcohol use. She reports that she does not use drugs.   Family History:  The patient's family history includes Cancer in her brother, father, maternal grandmother, and sister; Heart disease in her brother, father, and paternal grandmother; Hyperlipidemia in her brother and sister.    ROS:  Please see the history of present illness.      All other systems are reviewed and negative.    Physical Exam: Blood pressure (!) 122/50, pulse 71, height 5\' 5"  (1.651 m), weight 123 lb 3.2 oz (55.9 kg), SpO2 98 %.  GEN:   Elderly , pleasant,  Somewhat demented, very deaf  HEENT: Normal NECK: No JVD; No carotid bruits LYMPHATICS: No lymphadenopathy CARDIAC: RRR  RESPIRATORY:  Clear to auscultation without rales, wheezing or rhonchi  ABDOMEN: Soft, non-tender, non-distended MUSCULOSKELETAL:  No edema; No deformity  SKIN: Warm and dry NEUROLOGIC:   Very deaf,   Somewhat dementaed.    EKG:     Recent Labs: 06/03/2020: ALT 19; BUN 13; Creatinine, Ser 0.59; Hemoglobin 13.2; Platelets 266; Potassium 3.7; Sodium 131; TSH 2.703    Lipid Panel    Component Value Date/Time   CHOL 183 03/05/2020 0000   TRIG 97 03/05/2020 0000   HDL 70 03/05/2020 0000   CHOLHDL 2.6 03/14/2018 1831   VLDL 14 03/14/2018 1831   LDLCALC 94 03/05/2020 0000      Wt Readings from Last 3 Encounters:  06/08/20 123 lb 3.2 oz (55.9 kg)  06/04/20 122 lb 6.4 oz (55.5 kg)  06/03/20 122 lb 6.4 oz (55.5 kg)      Other studies Reviewed: Additional studies/ records that were reviewed today include: . Review of the above records demonstrates:     ASSESSMENT AND PLAN:  1.  Accelerated junctional tachycardia: She presented to the emergency room with some dizziness and rapid heart rate.  She was found to have an accelerated junctional tachycardia in the hospital.  There was some question that she has a history of paroxysmal atrial fibrillation.  She is not on oral anticoagulation because of her age and risk of falling.  We will discontinue the amlodipine and start Cardizem CD 180 mg a day.  We will have her follow up with  the NP at [facility] .  She will not need regular cardiology follow up .  Marland Kitchen   Current medicines are reviewed at length with the patient today.  The patient does not have concerns regarding medicines.  The following changes have been made:  no change  Labs/ tests ordered today include:  No orders of the defined types were placed in this encounter.  Disposition:   FU with me as needed.      Kristeen Miss, MD  06/08/2020 5:54 PM    [facility] [address] [place], [place], [place]  [address] Phone: [phone]; Fax: [phone]

## 2020-06-08 ENCOUNTER — Encounter: Payer: Self-pay | Admitting: Cardiovascular Disease

## 2020-06-08 ENCOUNTER — Other Ambulatory Visit: Payer: Self-pay

## 2020-06-08 ENCOUNTER — Ambulatory Visit (INDEPENDENT_AMBULATORY_CARE_PROVIDER_SITE_OTHER): Payer: Medicare PPO | Admitting: Cardiovascular Disease

## 2020-06-08 VITALS — BP 122/50 | HR 71 | Ht 65.0 in | Wt 123.2 lb

## 2020-06-08 DIAGNOSIS — I10 Essential (primary) hypertension: Secondary | ICD-10-CM | POA: Diagnosis not present

## 2020-06-08 DIAGNOSIS — I498 Other specified cardiac arrhythmias: Secondary | ICD-10-CM | POA: Diagnosis not present

## 2020-06-08 MED ORDER — DILTIAZEM HCL ER COATED BEADS 180 MG PO CP24
180.0000 mg | ORAL_CAPSULE | Freq: Every day | ORAL | 3 refills | Status: DC
Start: 2020-06-08 — End: 2021-04-30

## 2020-06-08 NOTE — Patient Instructions (Addendum)
Medication Instructions: Your physician has recommended you make the following change in your medication: Stop amlodipine Start cardizem 180 mg one daily    *If you need a refill on your cardiac medications before your next appointment, please call your pharmacy*   Lab Work: None ordered If you have labs (blood work) drawn today and your tests are completely normal, you will receive your results only by: Marland Kitchen MyChart Message (if you have MyChart) OR . A paper copy in the mail If you have any lab test that is abnormal or we need to change your treatment, we will call you to review the results.   Testing/Procedures: None ordered   Follow-Up: At [facility], you and your health needs are our priority.  As part of our continuing mission to provide you with exceptional heart care, we have created designated Provider Care Teams.  These Care Teams include your primary Cardiologist (physician) and Advanced Practice Providers (APPs -  Physician Assistants and Nurse Practitioners) who all work together to provide you with the care you need, when you need it.    Your next appointment:    As needed  The format for your next appointment:   In Person  Provider:   You may see Kristeen Miss, MD or one of the following Advanced Practice Providers on your designated Care Team:    Tereso Newcomer, PA-C  [place], [place]

## 2020-08-06 ENCOUNTER — Encounter (INDEPENDENT_AMBULATORY_CARE_PROVIDER_SITE_OTHER): Payer: Medicare PPO | Admitting: Ophthalmology

## 2020-08-06 ENCOUNTER — Other Ambulatory Visit: Payer: Self-pay

## 2020-08-10 ENCOUNTER — Other Ambulatory Visit: Payer: Self-pay

## 2020-08-10 ENCOUNTER — Ambulatory Visit (INDEPENDENT_AMBULATORY_CARE_PROVIDER_SITE_OTHER): Payer: Medicare PPO | Admitting: Ophthalmology

## 2020-08-10 ENCOUNTER — Encounter (INDEPENDENT_AMBULATORY_CARE_PROVIDER_SITE_OTHER): Payer: Self-pay | Admitting: Ophthalmology

## 2020-08-10 DIAGNOSIS — H353222 Exudative age-related macular degeneration, left eye, with inactive choroidal neovascularization: Secondary | ICD-10-CM

## 2020-08-10 DIAGNOSIS — H353211 Exudative age-related macular degeneration, right eye, with active choroidal neovascularization: Secondary | ICD-10-CM

## 2020-08-10 MED ORDER — BEVACIZUMAB CHEMO INJECTION 1.25MG/0.05ML SYRINGE FOR KALEIDOSCOPE
1.2500 mg | INTRAVITREAL | Status: AC | PRN
  Administered 2020-08-10: 1.25 mg via INTRAVITREAL

## 2020-08-10 NOTE — Assessment & Plan Note (Signed)
Overall stable yet increased CME centrally OD at 54-month follow-up, repeat injection Avastin today and examination in 2 months

## 2020-08-10 NOTE — Progress Notes (Signed)
08/10/2020     CHIEF COMPLAINT Patient presents for Retina Follow Up   HISTORY OF PRESENT ILLNESS: Candice Willis is a 86 y.o. female who presents to the clinic today for:   HPI    Retina Follow Up    Patient presents with  Dry AMD.  In both eyes.  Severity is moderate.  Duration of 3 months.  Since onset it is stable.  I, the attending physician,  performed the HPI with the patient and updated documentation appropriately.          Comments    3 Month Wet AMD f\u OU. Possible Avastin OD. OCT  Pt states no changes in vision. Denies any complaints.       Last edited by Elyse Jarvis on 08/10/2020  1:51 PM. (History)      Referring physician: Mast, Man X, NP [id number] [place],  [place] [address]  HISTORICAL INFORMATION:   Selected notes from the MEDICAL RECORD NUMBER    Lab Results  Component Value Date   HGBA1C 5.4 03/14/2018     CURRENT MEDICATIONS: No current outpatient medications on file. (Ophthalmic Drugs)   No current facility-administered medications for this visit. (Ophthalmic Drugs)   Current Outpatient Medications (Other)  Medication Sig  . aspirin EC 81 MG tablet Take 81 mg by mouth daily. Swallow whole.  Marland Kitchen atorvastatin (LIPITOR) 10 MG tablet Take 10 mg by mouth daily.  . Biotin 36144 MCG TABS Take 10,000 mcg by mouth daily.   . calcium carbonate (OSCAL) 1500 (600 Ca) MG TABS tablet Take 600 mg of elemental calcium by mouth 2 (two) times daily with a meal.  . Cholecalciferol (VITAMIN D3) 2000 units TABS Take 2,000 Units by mouth daily.  Marland Kitchen diltiazem (CARDIZEM CD) 180 MG 24 hr capsule Take 1 capsule (180 mg total) by mouth daily.  Marland Kitchen docusate sodium (COLACE) 100 MG capsule Take 100 mg by mouth 2 (two) times daily.   Marland Kitchen EPINEPHrine 0.3 mg/0.3 mL IJ SOAJ injection Inject 0.3 mLs (0.3 mg total) into the muscle once as needed (anaphylaxis/allergic reaction).  . lactose free nutrition (BOOST) LIQD Take 237 mLs by mouth. Once a day at breakfast  .  meclizine (ANTIVERT) 12.5 MG tablet Take 12.5 mg by mouth daily as needed for dizziness.   . Melatonin 3 MG TABS Take 3 mg by mouth at bedtime as needed (sleep).  . memantine (NAMENDA) 10 MG tablet Take 10 mg by mouth 2 (two) times daily.   . Multiple Vitamins-Minerals (PRESERVISION AREDS) TABS Take 2 tablets by mouth daily.  . Multiple Vitamins-Minerals (THEREMS-M) TABS Take 1 tablet by mouth daily.  . polyethylene glycol (MIRALAX / GLYCOLAX) packet Take 17 g by mouth every other day.   No current facility-administered medications for this visit. (Other)      REVIEW OF SYSTEMS:    ALLERGIES No Known Allergies  PAST MEDICAL HISTORY Past Medical History:  Diagnosis Date  . Arteriosclerotic cardiovascular disease   . Bee sting reaction 08/23/2016   Tongue swelling and difficulty swallowing /notes 08/23/2016  . Constipation   . Degenerative joint disease of right hip 10/15/14  . Fracture of multiple pubic rami (HCC) 10/15/14   Right inferior and superior pubic rami  . HOH (hard of hearing)    Wears bilateral hearing aids  . Hyperlipidemia   . Hypertension   . Macular degeneration   . Memory deficit   . Osteoporosis   . Pain in right foot   .  Palpitations   . Paroxysmal atrial fibrillation (HCC)   . Thyroid nodule    Status post surgery  . Ulcer of hard palate   . Vertigo    Past Surgical History:  Procedure Laterality Date  . ABDOMINAL HYSTERECTOMY  1968  . APPENDECTOMY  1940's  . COLONOSCOPY    . FOOT SURGERY Left   . HARDWARE REMOVAL Right 02/16/2016   Procedure: HARDWARE REMOVAL RT HIP;  Surgeon: Sheral Apley, MD;  Location: [facility] OR;  Service: Orthopedics;  Laterality: Right;  . HIP ARTHROPLASTY Right 02/16/2016   Procedure: ARTHROPLASTY BIPOLAR HIP (HEMIARTHROPLASTY);  Surgeon: Sheral Apley, MD;  Location: [facility] OR;  Service: Orthopedics;  Laterality: Right;  . INTRAMEDULLARY (IM) NAIL INTERTROCHANTERIC Right 09/11/2015   Procedure: INTRAMEDULLARY (IM) NAIL  INTERTROCHANTRIC;  Surgeon: Sheral Apley, MD;  Location: [facility] OR;  Service: Orthopedics;  Laterality: Right;  . KNEE SURGERY      FAMILY HISTORY Family History  Problem Relation Age of Onset  . Heart disease Father   . Cancer Father        prostate  . Cancer Maternal Grandmother        colon  . Heart disease Paternal Grandmother   . Hyperlipidemia Sister   . Cancer Sister        breast  . Heart disease Brother   . Hyperlipidemia Brother   . Cancer Brother        prostate    SOCIAL HISTORY Social History   Tobacco Use  . Smoking status: Former Smoker    Packs/day: 0.50    Types: Cigarettes    Quit date: 10/05/1971    Years since quitting: 48.8  . Smokeless tobacco: Never Used  Vaping Use  . Vaping Use: Never used  Substance Use Topics  . Alcohol use: Yes    Comment: 1-4 per week; eats 10 raisins daily soaked in Gin  . Drug use: No         OPHTHALMIC EXAM:  Base Eye Exam    Visual Acuity (Snellen - Linear)      Right Left   Dist cc 20/200 20/30 -1   Dist ph cc 20/60 -1    Correction: Glasses       Tonometry (Tonopen, 1:58 PM)      Right Left   Pressure 14 13       Pupils      Pupils Dark Light Shape React APD   Right PERRL 3 3 Round Sluggish None   Left PERRL 3 3 Round Sluggish None       Neuro/Psych    Oriented x3: Yes   Mood/Affect: Normal       Dilation    Both eyes: 1.0% Mydriacyl, 2.5% Phenylephrine @ 1:58 PM        Slit Lamp and Fundus Exam    External Exam      Right Left   External Normal Normal       Slit Lamp Exam      Right Left   Lids/Lashes Normal Normal   Conjunctiva/Sclera White and quiet White and quiet   Cornea Clear Clear   Anterior Chamber Deep and quiet Deep and quiet   Iris Round and reactive Round and reactive   Lens Centered posterior chamber intraocular lens Centered posterior chamber intraocular lens   Anterior Vitreous Normal Normal       Fundus Exam      Right Left   Posterior Vitreous Normal  Normal   Disc  Normal Normal   C/D Ratio 0.1 0.1   Macula Advanced age related macular degeneration, Drusen, no exudates, Macular thickening, Subretinal neovascular membrane Advanced age related macular degeneration, Disciform scar   Vessels Normal    Periphery Normal           IMAGING AND PROCEDURES  Imaging and Procedures for 08/10/20  OCT, Retina - OU - Both Eyes       Right Eye Quality was good. Scan locations included subfoveal. Central Foveal Thickness: 438. Progression has been stable. Findings include retinal drusen , choroidal neovascular membrane, cystoid macular edema, abnormal foveal contour, outer retinal atrophy, central retinal atrophy.   Left Eye Quality was good. Scan locations included subfoveal. Central Foveal Thickness: 324. Findings include abnormal foveal contour, subretinal fluid, pigment epithelial detachment.   Notes OD with chronically active CN VM, CME are increased centrally  at the 12-week interval today repeat intravitreal Avastin today. 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 2:51 PM. Confirmed correct patient, procedure, site, and patient consented.   Anesthesia Anesthetic medications included Akten 3.5%.   Procedure Preparation included Tobramycin 0.3%, 10% betadine to eyelids, 5% betadine to ocular surface. A supplied needle was used.   Injection:  1.25 mg Bevacizumab (AVASTIN) SOLN   NDC: [address]-2, Lot: [id number]   Route: Intravitreal, Site: Right Eye, Waste: 0 mg  Post-op Post injection exam found visual acuity of at least counting fingers. The patient tolerated the procedure well. There were no complications. The patient received written and verbal post procedure care education. Post injection medications were not given.                 ASSESSMENT/PLAN:  Exudative  age-related macular degeneration of right eye with active choroidal neovascularization (HCC) Overall stable yet increased CME centrally OD at 49-month follow-up, repeat injection Avastin today and examination in 2 months      ICD-10-CM   1. Exudative age-related macular degeneration of right eye with active choroidal neovascularization (HCC)  H35.3211 OCT, Retina - OU - Both Eyes    Intravitreal Injection, Pharmacologic Agent - OD - Right Eye    Bevacizumab (AVASTIN) SOLN 1.25 mg  2. Exudative age-related macular degeneration of left eye with inactive choroidal neovascularization (HCC)  H35.3222     1.  Overall slightly worse central CME over the at 71-month follow-up, repeat injection Avastin today and examination again in 2 months  2.  3.  Ophthalmic Meds Ordered this visit:  Meds ordered this encounter  Medications  . Bevacizumab (AVASTIN) SOLN 1.25 mg       Return in about 2 months (around 10/10/2020) for dilate, OD, AVASTIN OCT.  Patient Instructions  Patient instructed to contact the office promptly for new onset acuity decline or distortions    Explained the diagnoses, plan, and follow up with the patient and they expressed understanding.  Patient expressed understanding of the importance of proper follow up care.   Alford Highland Levell Tavano M.D. Diseases & Surgery of the Retina and Vitreous [facility] 08/10/20     Abbreviations: M myopia (nearsighted); A astigmatism; H hyperopia (farsighted); P presbyopia; Mrx spectacle prescription;  CTL contact lenses; OD right eye; OS left eye; OU both eyes  XT exotropia; ET esotropia; PEK punctate epithelial keratitis; PEE punctate epithelial erosions; DES dry eye syndrome; MGD  meibomian gland dysfunction; ATs artificial tears; PFAT's preservative free artificial tears; NSC nuclear sclerotic cataract; PSC posterior subcapsular cataract; ERM epi-retinal membrane; PVD posterior vitreous detachment; RD retinal detachment; DM  diabetes mellitus; DR diabetic retinopathy; NPDR non-proliferative diabetic retinopathy; PDR proliferative diabetic retinopathy; CSME clinically significant macular edema; DME diabetic macular edema; dbh dot blot hemorrhages; CWS cotton wool spot; POAG primary open angle glaucoma; C/D cup-to-disc ratio; HVF humphrey visual field; GVF goldmann visual field; OCT optical coherence tomography; IOP intraocular pressure; BRVO Branch retinal vein occlusion; CRVO central retinal vein occlusion; CRAO central retinal artery occlusion; BRAO branch retinal artery occlusion; RT retinal tear; SB scleral buckle; PPV pars plana vitrectomy; VH Vitreous hemorrhage; PRP panretinal laser photocoagulation; IVK intravitreal kenalog; VMT vitreomacular traction; MH Macular hole;  NVD neovascularization of the disc; NVE neovascularization elsewhere; AREDS age related eye disease study; ARMD age related macular degeneration; POAG primary open angle glaucoma; EBMD epithelial/anterior basement membrane dystrophy; ACIOL anterior chamber intraocular lens; IOL intraocular lens; PCIOL posterior chamber intraocular lens; Phaco/IOL phacoemulsification with intraocular lens placement; PRK photorefractive keratectomy; LASIK laser assisted in situ keratomileusis; HTN hypertension; DM diabetes mellitus; COPD chronic obstructive pulmonary disease

## 2020-08-10 NOTE — Patient Instructions (Signed)
Patient instructed to contact the office promptly for new onset acuity decline or distortions

## 2020-09-08 ENCOUNTER — Encounter: Payer: Self-pay | Admitting: Internal Medicine

## 2020-09-08 ENCOUNTER — Non-Acute Institutional Stay: Payer: Medicare PPO | Admitting: Internal Medicine

## 2020-09-08 DIAGNOSIS — I480 Paroxysmal atrial fibrillation: Secondary | ICD-10-CM

## 2020-09-08 DIAGNOSIS — F0390 Unspecified dementia without behavioral disturbance: Secondary | ICD-10-CM | POA: Diagnosis not present

## 2020-09-08 DIAGNOSIS — M810 Age-related osteoporosis without current pathological fracture: Secondary | ICD-10-CM | POA: Diagnosis not present

## 2020-09-08 DIAGNOSIS — E782 Mixed hyperlipidemia: Secondary | ICD-10-CM

## 2020-09-08 DIAGNOSIS — H8110 Benign paroxysmal vertigo, unspecified ear: Secondary | ICD-10-CM | POA: Diagnosis not present

## 2020-09-08 DIAGNOSIS — I10 Essential (primary) hypertension: Secondary | ICD-10-CM

## 2020-09-08 NOTE — Progress Notes (Signed)
Location:   [facility] Room Number: 822 Place of Service:  ALF [phone]) Provider:  Einar Crow MD  Mast, Man X, NP  Patient Care Team: Mast, Man X, NP as PCP - General (Internal Medicine) Nahser, Deloris Ping, MD as PCP - Cardiology (Cardiology) Venita Lick, MD as Consulting Physician (Orthopedic Surgery) Marykay Lex, MD as Consulting Physician (Cardiology) Mast, Man X, NP as Nurse Practitioner (Internal Medicine)  Extended Emergency Contact Information Primary Emergency Contact: [name],Aeryn Address: [address]           [place], [place] [address] [place] Home Phone: [phone] Mobile Phone: [phone] Relation: Niece Secondary Emergency Contact: [name],[name]  [place] Mobile Phone: [phone] Relation: Niece  Code Status:  DNR Goals of care: Advanced Directive information Advanced Directives 06/03/2020  Does Patient Have a Medical Advance Directive? Yes  Type of Advance Directive Out of facility DNR (pink MOST or yellow form)  Does patient want to make changes to medical advance directive? No - Patient declined  Copy of Healthcare Power of Attorney in Chart? -  Would patient like information on creating a medical advance directive? No - Patient declined  Pre-existing out of facility DNR order (yellow form or pink MOST form) Pink MOST/Yellow Form most recent copy in chart - Physician notified to receive inpatient order     Chief Complaint  Patient presents with   Medical Management of Chronic Issues    HPI:  Pt is a 86 y.o. female seen today for medical management of chronic diseases.    She has h/o Hypertension, Hyperlipidemia, Cognitive Impairment, BPPV, Vit D def, Hyponatremia, h/o PVC , Macular degeneration Went to ED in 7/21 for ? A Fib But Dr Melburn Popper thinks it was Accelerated Junctional Rhythm. Norvasc stopped and started on Cardizem. No ANticoagulation  Doing well. Weight stable No New Nursing  issues Walk with the walker No Falls Had no Acute complains Does have dementia   Past Medical History:  Diagnosis Date   Arteriosclerotic cardiovascular disease    Bee sting reaction 08/23/2016   Tongue swelling and difficulty swallowing /notes 08/23/2016   Constipation    Degenerative joint disease of right hip 10/15/14   Fracture of multiple pubic rami (HCC) 10/15/14   Right inferior and superior pubic rami   HOH (hard of hearing)    Wears bilateral hearing aids   Hyperlipidemia    Hypertension    Macular degeneration    Memory deficit    Osteoporosis    Pain in right foot    Palpitations    Paroxysmal atrial fibrillation (HCC)    Thyroid nodule    Status post surgery   Ulcer of hard palate    Vertigo    Past Surgical History:  Procedure Laterality Date   ABDOMINAL HYSTERECTOMY  1968   APPENDECTOMY  1940's   COLONOSCOPY     FOOT SURGERY Left    HARDWARE REMOVAL Right 02/16/2016   Procedure: HARDWARE REMOVAL RT HIP;  Surgeon: Sheral Apley, MD;  Location: [facility] OR;  Service: Orthopedics;  Laterality: Right;   HIP ARTHROPLASTY Right 02/16/2016   Procedure: ARTHROPLASTY BIPOLAR HIP (HEMIARTHROPLASTY);  Surgeon: Sheral Apley, MD;  Location: [facility] OR;  Service: Orthopedics;  Laterality: Right;   INTRAMEDULLARY (IM) NAIL INTERTROCHANTERIC Right 09/11/2015   Procedure: INTRAMEDULLARY (IM) NAIL INTERTROCHANTRIC;  Surgeon: Sheral Apley, MD;  Location: [facility] OR;  Service: Orthopedics;  Laterality: Right;   KNEE SURGERY      No  Known Allergies  Allergies as of 09/08/2020   No Known Allergies     Medication List       Accurate as of September 08, 2020  3:20 PM. If you have any questions, ask your nurse or doctor.        aspirin EC 81 MG tablet Take 81 mg by mouth daily. Swallow whole.   atorvastatin 10 MG tablet Commonly known as: LIPITOR Take 10 mg by mouth daily.   Biotin 1610910000 MCG Tabs Take 10,000 mcg by mouth daily.   calcium  carbonate 1500 (600 Ca) MG Tabs tablet Commonly known as: OSCAL Take 600 mg of elemental calcium by mouth 2 (two) times daily with a meal.   diltiazem 180 MG 24 hr capsule Commonly known as: CARDIZEM CD Take 1 capsule (180 mg total) by mouth daily.   docusate sodium 100 MG capsule Commonly known as: COLACE Take 100 mg by mouth 2 (two) times daily.   EPINEPHrine 0.3 mg/0.3 mL Soaj injection Commonly known as: EPI-PEN Inject 0.3 mLs (0.3 mg total) into the muscle once as needed (anaphylaxis/allergic reaction).   lactose free nutrition Liqd Take 237 mLs by mouth. Once a day at breakfast   meclizine 12.5 MG tablet Commonly known as: ANTIVERT Take 12.5 mg by mouth daily as needed for dizziness.   melatonin 3 MG Tabs tablet Take 3 mg by mouth at bedtime as needed (sleep).   memantine 10 MG tablet Commonly known as: NAMENDA Take 10 mg by mouth 2 (two) times daily.   polyethylene glycol 17 g packet Commonly known as: MIRALAX / GLYCOLAX Take 17 g by mouth every other day.   Therems-M Tabs Take 1 tablet by mouth daily.   PreserVision AREDS Tabs Take 2 tablets by mouth daily.   Vitamin D3 50 MCG (2000 UT) Tabs Take 2,000 Units by mouth daily.       Review of Systems  Review of Systems  Constitutional: Negative for activity change, appetite change, chills, diaphoresis, fatigue and fever.  HENT: Negative for mouth sores, postnasal drip, rhinorrhea, sinus pain and sore throat.   Respiratory: Negative for apnea, cough, chest tightness, shortness of breath and wheezing.   Cardiovascular: Negative for chest pain, palpitations and leg swelling.  Gastrointestinal: Negative for abdominal distention, abdominal pain, constipation, diarrhea, nausea and vomiting.  Genitourinary: Negative for dysuria and frequency.  Musculoskeletal: Negative for arthralgias, joint swelling and myalgias.  Skin: Negative for rash.  Neurological: Negative for dizziness, syncope, weakness, light-headedness  and numbness.  Psychiatric/Behavioral: Negative for behavioral problems, confusion and sleep disturbance.     Immunization History  Administered Date(s) Administered   Influenza Whole 08/25/2018   Influenza, High Dose Seasonal PF 08/24/2019   Influenza-Unspecified 09/07/2016, 09/12/2017   Moderna SARS-COVID-2 Vaccination 11/23/2019, 12/21/2019   PPD Test 02/18/2016   Pneumococcal Conjugate-13 09/18/2017   Pneumococcal-Unspecified 10/22/2018   Tdap 10/07/2012   Pertinent  Health Maintenance Due  Topic Date Due   INFLUENZA VACCINE  06/21/2020   DEXA SCAN  Completed   PNA vac Low Risk Adult  Completed   Fall Risk  08/17/2018 08/03/2017 10/24/2014  Falls in the past year? No No Yes  Number falls in past yr: - - 1  Injury with Fall? - - Yes  Risk Factor Category  - - High Fall Risk  Risk for fall due to : - - History of fall(s)   Functional Status Survey:    Vitals:   09/08/20 1511  BP: 128/74  Pulse: 66  Resp: 20  Temp: (!) 96.8 F (36 C)  SpO2: 99%  Weight: 124 lb 3.2 oz (56.3 kg)  Height: 5\' 5"  (1.651 m)   Body mass index is 20.67 kg/m. Physical Exam Constitutional: Oriented to person, place, and time. Well-developed and well-nourished.  HENT:  Head: Normocephalic.  Mouth/Throat: Oropharynx is clear and moist.  Eyes: Pupils are equal, round, and reactive to light.  Neck: Neck supple.  Cardiovascular: Normal rate and normal heart sounds.  No murmur heard. Pulmonary/Chest: Effort normal and breath sounds normal. No respiratory distress. No wheezes. She has no rales.  Abdominal: Soft. Bowel sounds are normal. No distension. There is no tenderness. There is no rebound.  Musculoskeletal: No edema.  Lymphadenopathy: none Neurological: Alert and oriented to person, place, and time.  Skin: Skin is warm and dry.  Psychiatric: Normal mood and affect. Behavior is normal. Thought content normal.   Labs reviewed: Recent Labs    03/09/20 0000 03/17/20 0000  06/03/20 0952  NA 131* 131* 131*  K 4.4 4.1 3.7  CL 101 100 99  CO2 22 24* 21*  GLUCOSE  --   --  107*  BUN 18 13 13   CREATININE 0.9 0.7 0.59  CALCIUM 10.0 10.2 9.8   Recent Labs    03/05/20 0000 06/03/20 0952  AST 20 26  ALT 13 19  ALKPHOS 54 51  BILITOT  --  0.9  PROT  --  7.1  ALBUMIN 4.0 4.1   Recent Labs    03/05/20 0000 06/03/20 1022  WBC 5.8 6.5  NEUTROABS 818 5.3  HGB 13.5 13.2  HCT 39 39.2  MCV  --  94.2  PLT 254 266   Lab Results  Component Value Date   TSH 2.703 06/03/2020   Lab Results  Component Value Date   HGBA1C 5.4 03/14/2018   Lab Results  Component Value Date   CHOL 183 03/05/2020   HDL 70 03/05/2020   LDLCALC 94 03/05/2020   TRIG 97 03/05/2020   CHOLHDL 2.6 03/14/2018    Significant Diagnostic Results in last 30 days:  Intravitreal Injection, Pharmacologic Agent - OD - Right Eye  Result Date: 08/10/2020 Time Out 08/10/2020. 2:51 PM. Confirmed correct patient, procedure, site, and patient consented. Anesthesia Anesthetic medications included Akten 3.5%. Procedure Preparation included Tobramycin 0.3%, 10% betadine to eyelids, 5% betadine to ocular surface. A supplied needle was used. Injection: 1.25 mg Bevacizumab (AVASTIN) SOLN   NDC: 08/12/2020, Lot9/22/2021   Route: Intravitreal, Site: Right Eye, Waste: 0 mg Post-op Post injection exam found visual acuity of at least counting fingers. The patient tolerated the procedure well. There were no complications. The patient received written and verbal post procedure care education. Post injection medications were not given.   OCT, Retina - OU - Both Eyes  Result Date: 08/10/2020 Right Eye Quality was good. Scan locations included subfoveal. Central Foveal Thickness: 438. Progression has been stable. Findings include retinal drusen , choroidal neovascular membrane, cystoid macular edema, abnormal foveal contour, outer retinal atrophy, central retinal atrophy. Left Eye Quality was good. Scan  locations included subfoveal. Central Foveal Thickness: 324. Findings include abnormal foveal contour, subretinal fluid, pigment epithelial detachment. Notes OD with chronically active CN VM, CME are increased centrally  at the 12-week interval today repeat intravitreal Avastin today. 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 PAF (paroxysmal atrial fibrillation) / Junctional Rhythm Continue on Cardizem Started on aspirin No Anticoagulation  Essential hypertension Norvasc discontinued  On Cardizem Dementia without behavioral disturbance, unspecified dementia type (HCC) Continue Na,menda Mixed hyperlipidemia On Statin Benign paroxysmal positional vertigo, unspecified laterality Meclizine pRN Age-related osteoporosis without current pathological fracture Has been on Reclast before On Calcium and Vit d     Family/ staff Communication:   Labs/tests ordered:

## 2020-09-10 DIAGNOSIS — I482 Chronic atrial fibrillation, unspecified: Secondary | ICD-10-CM | POA: Diagnosis not present

## 2020-09-10 DIAGNOSIS — I10 Essential (primary) hypertension: Secondary | ICD-10-CM | POA: Diagnosis not present

## 2020-09-11 LAB — COMPREHENSIVE METABOLIC PANEL
Albumin: 3.9 (ref 3.5–5.0)
Calcium: 10 (ref 8.7–10.7)
Globulin: 2.5

## 2020-09-11 LAB — HEPATIC FUNCTION PANEL
ALT: 14 (ref 7–35)
AST: 19 (ref 13–35)
Alkaline Phosphatase: 52 (ref 25–125)
Bilirubin, Total: 0.7

## 2020-09-11 LAB — BASIC METABOLIC PANEL
BUN: 15 (ref 4–21)
CO2: 26 — AB (ref 13–22)
Chloride: 102 (ref 99–108)
Creatinine: 0.8 (ref 0.5–1.1)
Glucose: 76
Potassium: 4.3 (ref 3.4–5.3)
Sodium: 135 — AB (ref 137–147)

## 2020-09-11 LAB — CBC AND DIFFERENTIAL
HCT: 37 (ref 36–46)
Hemoglobin: 12.8 (ref 12.0–16.0)
Neutrophils Absolute: 4365
Platelets: 281 (ref 150–399)
WBC: 6.2

## 2020-09-11 LAB — CBC: RBC: 4 (ref 3.87–5.11)

## 2020-09-25 ENCOUNTER — Encounter: Payer: Self-pay | Admitting: Internal Medicine

## 2020-09-25 ENCOUNTER — Non-Acute Institutional Stay: Payer: Medicare PPO | Admitting: Internal Medicine

## 2020-09-25 DIAGNOSIS — I10 Essential (primary) hypertension: Secondary | ICD-10-CM

## 2020-09-25 DIAGNOSIS — E782 Mixed hyperlipidemia: Secondary | ICD-10-CM

## 2020-09-25 DIAGNOSIS — I480 Paroxysmal atrial fibrillation: Secondary | ICD-10-CM | POA: Diagnosis not present

## 2020-09-25 DIAGNOSIS — W19XXXA Unspecified fall, initial encounter: Secondary | ICD-10-CM | POA: Diagnosis not present

## 2020-09-25 DIAGNOSIS — F0390 Unspecified dementia without behavioral disturbance: Secondary | ICD-10-CM | POA: Diagnosis not present

## 2020-09-25 NOTE — Progress Notes (Signed)
Location:    [facility] Room Number: 822 Place of Service:  ALF [phone]) Provider:  Einar Crow MD  Mast, Man X, NP  Patient Care Team: Mast, Man X, NP as PCP - General (Internal Medicine) Nahser, Deloris Ping, MD as PCP - Cardiology (Cardiology) Venita Lick, MD as Consulting Physician (Orthopedic Surgery) Marykay Lex, MD as Consulting Physician (Cardiology) Mast, Man X, NP as Nurse Practitioner (Internal Medicine)  Extended Emergency Contact Information Primary Emergency Contact: [name],Ayaana Address: [address]           [place], [place] [address] [place] Home Phone: [phone] Mobile Phone: [phone] Relation: Niece Secondary Emergency Contact: [name],[name]  [place] Mobile Phone: [phone] Relation: Niece  Code Status:  DNR Goals of care: Advanced Directive information Advanced Directives 06/03/2020  Does Patient Have a Medical Advance Directive? Yes  Type of Advance Directive Out of facility DNR (pink MOST or yellow form)  Does patient want to make changes to medical advance directive? No - Patient declined  Copy of Healthcare Power of Attorney in Chart? -  Would patient like information on creating a medical advance directive? No - Patient declined  Pre-existing out of facility DNR order (yellow form or pink MOST form) Pink MOST/Yellow Form most recent copy in chart - Physician notified to receive inpatient order     Chief Complaint  Patient presents with  . Acute Visit    Fall    HPI:  Pt is a 86 y.o. female seen today for an acute visit for Fall with Injuries  She has h/o Hypertension, Hyperlipidemia, Cognitive Impairment, BPPV, Vit D def, Hyponatremia, h/o PVC , Macular degeneration Went to ED in 7/21 for ? A Fib But Dr Melburn Popper thinks it was Accelerated Junctional Rhythm. Norvasc stopped and started on Cardizem. No ANticoagulation  Patient was walking outside with Her walker, when she hit the  curve and fell.  Patient denies dizziness.  She got herself up and walk inside the facility and was covered in bruises and bleeding from her finger.  Denies any pain.  Was able to walk with her walker Past Medical History:  Diagnosis Date  . Arteriosclerotic cardiovascular disease   . Bee sting reaction 08/23/2016   Tongue swelling and difficulty swallowing /notes 08/23/2016  . Constipation   . Degenerative joint disease of right hip 10/15/14  . Fracture of multiple pubic rami (HCC) 10/15/14   Right inferior and superior pubic rami  . HOH (hard of hearing)    Wears bilateral hearing aids  . Hyperlipidemia   . Hypertension   . Macular degeneration   . Memory deficit   . Osteoporosis   . Pain in right foot   . Palpitations   . Paroxysmal atrial fibrillation (HCC)   . Thyroid nodule    Status post surgery  . Ulcer of hard palate   . Vertigo    Past Surgical History:  Procedure Laterality Date  . ABDOMINAL HYSTERECTOMY  1968  . APPENDECTOMY  1940's  . COLONOSCOPY    . FOOT SURGERY Left   . HARDWARE REMOVAL Right 02/16/2016   Procedure: HARDWARE REMOVAL RT HIP;  Surgeon: Sheral Apley, MD;  Location: [facility] OR;  Service: Orthopedics;  Laterality: Right;  . HIP ARTHROPLASTY Right 02/16/2016   Procedure: ARTHROPLASTY BIPOLAR HIP (HEMIARTHROPLASTY);  Surgeon: Sheral Apley, MD;  Location: [facility] OR;  Service: Orthopedics;  Laterality: Right;  . INTRAMEDULLARY (IM) NAIL INTERTROCHANTERIC Right 09/11/2015   Procedure: INTRAMEDULLARY (  IM) NAIL INTERTROCHANTRIC;  Surgeon: Sheral Apley, MD;  Location: [facility] OR;  Service: Orthopedics;  Laterality: Right;  . KNEE SURGERY      No Known Allergies  Allergies as of 09/25/2020   No Known Allergies     Medication List       Accurate as of September 25, 2020  3:38 PM. If you have any questions, ask your nurse or doctor.        aspirin EC 81 MG tablet Take 81 mg by mouth daily. Swallow whole.   atorvastatin 10 MG tablet Commonly known as:  LIPITOR Take 10 mg by mouth daily.   Biotin 49179 MCG Tabs Take 10,000 mcg by mouth daily.   calcium carbonate 1500 (600 Ca) MG Tabs tablet Commonly known as: OSCAL Take 600 mg of elemental calcium by mouth 2 (two) times daily with a meal.   diltiazem 180 MG 24 hr capsule Commonly known as: CARDIZEM CD Take 1 capsule (180 mg total) by mouth daily.   docusate sodium 100 MG capsule Commonly known as: COLACE Take 100 mg by mouth 2 (two) times daily.   EPINEPHrine 0.3 mg/0.3 mL Soaj injection Commonly known as: EPI-PEN Inject 0.3 mLs (0.3 mg total) into the muscle once as needed (anaphylaxis/allergic reaction).   lactose free nutrition Liqd Take 237 mLs by mouth. Once a day at breakfast   meclizine 12.5 MG tablet Commonly known as: ANTIVERT Take 12.5 mg by mouth daily as needed for dizziness.   melatonin 3 MG Tabs tablet Take 3 mg by mouth at bedtime as needed (sleep).   memantine 10 MG tablet Commonly known as: NAMENDA Take 10 mg by mouth 2 (two) times daily.   polyethylene glycol 17 g packet Commonly known as: MIRALAX / GLYCOLAX Take 17 g by mouth every other day.   Therems-M Tabs Take 1 tablet by mouth daily.   PreserVision AREDS Tabs Take 2 tablets by mouth daily.   Vitamin D3 50 MCG (2000 UT) Tabs Take 2,000 Units by mouth daily.       Review of Systems  Review of Systems  Constitutional: Negative for activity change, appetite change, chills, diaphoresis, fatigue and fever.  HENT: Negative for mouth sores, postnasal drip, rhinorrhea, sinus pain and sore throat.   Respiratory: Negative for apnea, cough, chest tightness, shortness of breath and wheezing.   Cardiovascular: Negative for chest pain, palpitations and leg swelling.  Gastrointestinal: Negative for abdominal distention, abdominal pain, constipation, diarrhea, nausea and vomiting.  Genitourinary: Negative for dysuria and frequency.  Musculoskeletal: Negative for arthralgias, joint swelling and  myalgias.  Skin: Negative for rash.  Neurological: Negative for dizziness, syncope, weakness, light-headedness and numbness.  Psychiatric/Behavioral: Negative for behavioral problems, confusion and sleep disturbance.     Immunization History  Administered Date(s) Administered  . Influenza Whole 08/25/2018  . Influenza, High Dose Seasonal PF 08/24/2019  . Influenza-Unspecified 09/07/2016, 09/12/2017  . Moderna SARS-COVID-2 Vaccination 11/23/2019, 12/21/2019  . PPD Test 02/18/2016  . Pneumococcal Conjugate-13 09/18/2017  . Pneumococcal-Unspecified 10/22/2018  . Tdap 10/07/2012   Pertinent  Health Maintenance Due  Topic Date Due  . INFLUENZA VACCINE  06/21/2020  . DEXA SCAN  Completed  . PNA vac Low Risk Adult  Completed   Fall Risk  08/17/2018 08/03/2017 10/24/2014  Falls in the past year? No No Yes  Number falls in past yr: - - 1  Injury with Fall? - - Yes  Risk Factor Category  - - High Fall Risk  Risk for fall due  to : - - History of fall(s)   Functional Status Survey:    Vitals:   09/25/20 1532  BP: 132/72  Pulse: 74  Resp: 20  Temp: (!) 97.2 F (36.2 C)  SpO2: 95%  Weight: 124 lb 3.2 oz (56.3 kg)  Height: 5\' 5"  (1.651 m)   Body mass index is 20.67 kg/m. Physical Exam Vitals reviewed.  Constitutional:      Appearance: Normal appearance.  HENT:     Head: Normocephalic.     Nose: Nose normal.     Mouth/Throat:     Mouth: Mucous membranes are moist.     Pharynx: Oropharynx is clear.  Eyes:     Pupils: Pupils are equal, round, and reactive to light.  Cardiovascular:     Rate and Rhythm: Normal rate and regular rhythm.     Pulses: Normal pulses.  Pulmonary:     Effort: Pulmonary effort is normal.     Breath sounds: Normal breath sounds.  Abdominal:     General: Abdomen is flat. Bowel sounds are normal.     Palpations: Abdomen is soft.  Musculoskeletal:        General: No swelling.     Cervical back: Neck supple.  Skin:    Comments: She had Bruises in  her Legs with abrasions and Skin tears. Also she had nail loose of right Forefinger nail with small tear which was bleeding profusely till Pressure was applied and it did stop  Neurological:     General: No focal deficit present.     Mental Status: She is alert.  Psychiatric:        Mood and Affect: Mood normal.        Thought Content: Thought content normal.     Labs reviewed: Recent Labs    03/17/20 0000 06/03/20 0952 09/11/20 0000  NA 131* 131* 135*  K 4.1 3.7 4.3  CL 100 99 102  CO2 24* 21* 26*  GLUCOSE  --  107*  --   BUN 13 13 15   CREATININE 0.7 0.59 0.8  CALCIUM 10.2 9.8 10.0   Recent Labs    03/05/20 0000 06/03/20 0952 09/11/20 0000  AST 20 26 19   ALT 13 19 14   ALKPHOS 54 51 52  BILITOT  --  0.9  --   PROT  --  7.1  --   ALBUMIN 4.0 4.1 3.9   Recent Labs    03/05/20 0000 06/03/20 1022 09/11/20 0000  WBC 5.8 6.5 6.2  NEUTROABS 818 5.3 4,365.00  HGB 13.5 13.2 12.8  HCT 39 39.2 37  MCV  --  94.2  --   PLT 254 266 281   Lab Results  Component Value Date   TSH 2.703 06/03/2020   Lab Results  Component Value Date   HGBA1C 5.4 03/14/2018   Lab Results  Component Value Date   CHOL 183 03/05/2020   HDL 70 03/05/2020   LDLCALC 94 03/05/2020   TRIG 97 03/05/2020   CHOLHDL 2.6 03/14/2018    Significant Diagnostic Results in last 30 days:  No results found.  Assessment/Plan Fall, initial encounter With Skin tear and Bruises D/w the nurse Vitals q shift with Neuro check  Hold Aspirin for 1 week If she starts bleeding again will send her to ED   PAF (paroxysmal atrial fibrillation) (HCC)/Junctional Rhythm On Cardizem No oac  Essential hypertension Stable on Cardizem Dementia without behavioral disturbance, unspecified dementia type (HCC) On namneda Mixed hyperlipidemia On statin  Family/ staff Communication:   Labs/tests ordered:

## 2020-10-12 ENCOUNTER — Encounter (INDEPENDENT_AMBULATORY_CARE_PROVIDER_SITE_OTHER): Payer: Self-pay

## 2020-10-12 ENCOUNTER — Encounter (INDEPENDENT_AMBULATORY_CARE_PROVIDER_SITE_OTHER): Payer: Medicare PPO | Admitting: Ophthalmology

## 2020-10-19 ENCOUNTER — Ambulatory Visit (INDEPENDENT_AMBULATORY_CARE_PROVIDER_SITE_OTHER): Payer: Medicare PPO | Admitting: Ophthalmology

## 2020-10-19 ENCOUNTER — Other Ambulatory Visit: Payer: Self-pay

## 2020-10-19 ENCOUNTER — Encounter (INDEPENDENT_AMBULATORY_CARE_PROVIDER_SITE_OTHER): Payer: Self-pay | Admitting: Ophthalmology

## 2020-10-19 DIAGNOSIS — H35722 Serous detachment of retinal pigment epithelium, left eye: Secondary | ICD-10-CM

## 2020-10-19 DIAGNOSIS — H35351 Cystoid macular degeneration, right eye: Secondary | ICD-10-CM | POA: Diagnosis not present

## 2020-10-19 DIAGNOSIS — H353211 Exudative age-related macular degeneration, right eye, with active choroidal neovascularization: Secondary | ICD-10-CM

## 2020-10-19 MED ORDER — BEVACIZUMAB CHEMO INJECTION 1.25MG/0.05ML SYRINGE FOR KALEIDOSCOPE
1.2500 mg | INTRAVITREAL | Status: AC | PRN
  Administered 2020-10-19: 1.25 mg via INTRAVITREAL

## 2020-10-19 NOTE — Assessment & Plan Note (Signed)
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

## 2020-10-19 NOTE — Progress Notes (Signed)
10/19/2020     CHIEF COMPLAINT Patient presents for Retina Follow Up   HISTORY OF PRESENT ILLNESS: Candice Willis is a 86 y.o. female who presents to the clinic today for:   HPI    Retina Follow Up    Patient presents with  Wet AMD.  In right eye.  This started 10 weeks ago.  Duration of 10 weeks.          Comments    10 WK F/U OD, POSS AVASTIN OD   Pt reports stable vision, no new f/f, no pain or pressure.        Last edited by Varney Biles D on 10/19/2020  9:38 AM. (History)      Referring physician: Mast, Man X, NP [address] [place],  [place] [address]  HISTORICAL INFORMATION:   Selected notes from the MEDICAL RECORD NUMBER    Lab Results  Component Value Date   HGBA1C 5.4 03/14/2018     CURRENT MEDICATIONS: No current outpatient medications on file. (Ophthalmic Drugs)   No current facility-administered medications for this visit. (Ophthalmic Drugs)   Current Outpatient Medications (Other)  Medication Sig  . aspirin EC 81 MG tablet Take 81 mg by mouth daily. Swallow whole.  Marland Kitchen atorvastatin (LIPITOR) 10 MG tablet Take 10 mg by mouth daily.  . Biotin 28366 MCG TABS Take 10,000 mcg by mouth daily.   . calcium carbonate (OSCAL) 1500 (600 Ca) MG TABS tablet Take 600 mg of elemental calcium by mouth 2 (two) times daily with a meal.  . Cholecalciferol (VITAMIN D3) 2000 units TABS Take 2,000 Units by mouth daily.  Marland Kitchen diltiazem (CARDIZEM CD) 180 MG 24 hr capsule Take 1 capsule (180 mg total) by mouth daily.  Marland Kitchen docusate sodium (COLACE) 100 MG capsule Take 100 mg by mouth 2 (two) times daily.   Marland Kitchen EPINEPHrine 0.3 mg/0.3 mL IJ SOAJ injection Inject 0.3 mLs (0.3 mg total) into the muscle once as needed (anaphylaxis/allergic reaction).  . lactose free nutrition (BOOST) LIQD Take 237 mLs by mouth. Once a day at breakfast  . meclizine (ANTIVERT) 12.5 MG tablet Take 12.5 mg by mouth daily as needed for dizziness.   . Melatonin 3 MG TABS Take 3 mg by mouth at  bedtime as needed (sleep).  . memantine (NAMENDA) 10 MG tablet Take 10 mg by mouth 2 (two) times daily.   . Multiple Vitamins-Minerals (PRESERVISION AREDS) TABS Take 2 tablets by mouth daily.  . Multiple Vitamins-Minerals (THEREMS-M) TABS Take 1 tablet by mouth daily.  . polyethylene glycol (MIRALAX / GLYCOLAX) packet Take 17 g by mouth every other day.   No current facility-administered medications for this visit. (Other)      REVIEW OF SYSTEMS:    ALLERGIES No Known Allergies  PAST MEDICAL HISTORY Past Medical History:  Diagnosis Date  . Arteriosclerotic cardiovascular disease   . Bee sting reaction 08/23/2016   Tongue swelling and difficulty swallowing /notes 08/23/2016  . Constipation   . Degenerative joint disease of right hip 10/15/14  . Fracture of multiple pubic rami (HCC) 10/15/14   Right inferior and superior pubic rami  . HOH (hard of hearing)    Wears bilateral hearing aids  . Hyperlipidemia   . Hypertension   . Macular degeneration   . Memory deficit   . Osteoporosis   . Pain in right foot   . Palpitations   . Paroxysmal atrial fibrillation (HCC)   . Thyroid nodule    Status post surgery  .  Ulcer of hard palate   . Vertigo    Past Surgical History:  Procedure Laterality Date  . ABDOMINAL HYSTERECTOMY  1968  . APPENDECTOMY  1940's  . COLONOSCOPY    . FOOT SURGERY Left   . HARDWARE REMOVAL Right 02/16/2016   Procedure: HARDWARE REMOVAL RT HIP;  Surgeon: Sheral Apleyimothy D Murphy, MD;  [facility];  Service: Orthopedics;  Laterality: Right;  . HIP ARTHROPLASTY Right 02/16/2016   Procedure: ARTHROPLASTY BIPOLAR HIP (HEMIARTHROPLASTY);  Surgeon: Sheral Apleyimothy D Murphy, MD;  [facility];  Service: Orthopedics;  Laterality: Right;  . INTRAMEDULLARY (IM) NAIL INTERTROCHANTERIC Right 09/11/2015   Procedure: INTRAMEDULLARY (IM) NAIL INTERTROCHANTRIC;  Surgeon: Sheral Apleyimothy D Murphy, MD;  [facility] OR;  Service: Orthopedics;  Laterality: Right;  . KNEE SURGERY       FAMILY HISTORY Family History  Problem Relation Age of Onset  . Heart disease Father   . Cancer Father        prostate  . Cancer Maternal Grandmother        colon  . Heart disease Paternal Grandmother   . Hyperlipidemia Sister   . Cancer Sister        breast  . Heart disease Brother   . Hyperlipidemia Brother   . Cancer Brother        prostate    SOCIAL HISTORY Social History   Tobacco Use  . Smoking status: Former Smoker    Packs/day: 0.50    Types: Cigarettes    Quit date: 10/05/1971    Years since quitting: 49.0  . Smokeless tobacco: Never Used  Vaping Use  . Vaping Use: Never used  Substance Use Topics  . Alcohol use: Yes    Comment: 1-4 per week; eats 10 raisins daily soaked in Gin  . Drug use: No         OPHTHALMIC EXAM: Base Eye Exam    Visual Acuity (ETDRS)      Right Left   Dist cc 20/100 +1 20/30 -2   Dist ph cc 20/70 -2 NI   Correction: Glasses       Pupils      Pupils Dark Light Shape React APD   Right PERRL 3 3 Round Sluggish None   Left PERRL 2 2 Round Sluggish None       Visual Fields (Counting fingers)      Left Right    Full Full       Extraocular Movement      Right Left    Full Full       Neuro/Psych    Oriented x3: Yes   Mood/Affect: Normal       Dilation    Right eye: 1.0% Mydriacyl, 2.5% Phenylephrine @ 9:47 AM        Slit Lamp and Fundus Exam    External Exam      Right Left   External Normal Normal       Slit Lamp Exam      Right Left   Lids/Lashes Normal Normal   Conjunctiva/Sclera White and quiet White and quiet   Cornea Clear Clear   Anterior Chamber Deep and quiet Deep and quiet   Iris Round and reactive Round and reactive   Lens Centered posterior chamber intraocular lens Centered posterior chamber intraocular lens   Anterior Vitreous Normal Normal       Fundus Exam      Right Left   Posterior Vitreous Normal    Disc Normal  C/D Ratio 0.1    Macula Advanced age related macular  degeneration, Drusen, no exudates, Macular thickening, Subretinal neovascular membrane    Vessels Normal    Periphery Normal           IMAGING AND PROCEDURES  Imaging and Procedures for 10/19/20  OCT, Retina - OU - Both Eyes       Right Eye Quality was good. Scan locations included subfoveal. Central Foveal Thickness: 400. Progression has been stable. Findings include retinal drusen , choroidal neovascular membrane, cystoid macular edema, abnormal foveal contour, outer retinal atrophy, central retinal atrophy.   Left Eye Quality was good. Scan locations included subfoveal. Central Foveal Thickness: 326. Progression has been stable. Findings include abnormal foveal contour, subretinal fluid, pigment epithelial detachment.   Notes OD with chronically active CN VM, CME are stable centrally  at the 10-week interval today repeat intravitreal Avastin today. OS with chronic pigment epithelial detachment serous sub-RPE detachment no change for years,  observe       Intravitreal Injection, Pharmacologic Agent - OD - Right Eye       Time Out 10/19/2020. 10:25 AM. Confirmed correct patient, procedure, site, and patient consented.   Anesthesia Topical anesthesia was used. Anesthetic medications included Akten 3.5%.   Procedure Preparation included Tobramycin 0.3%, 10% betadine to eyelids, 5% betadine to ocular surface, Ofloxacin . A supplied needle was used.   Injection:  1.25 mg Bevacizumab (AVASTIN) SOLN   NDC: 70360-001-02, Lot: [id number]   Route: Intravitreal, Site: Right Eye, Waste: 0 mg  Post-op Post injection exam found visual acuity of at least counting fingers. The patient tolerated the procedure well. There were no complications. The patient received written and verbal post procedure care education. Post injection medications were not given.                 ASSESSMENT/PLAN:  Macular pigment epithelial detachment, left Stable now for over 5 years, no therapy  during that interval  Exudative age-related macular degeneration of right eye with active choroidal neovascularization (HCC) Chronic active CNVM with intra and subretinal fluid and pigment epithelial detachment, stabilized and yet not improving at short interval therapy and overall stable at longer interval of therapy today 10 weeks  Cystoid macular edema of right eye Chronically active OD secondary to CNVM      ICD-10-CM   1. Exudative age-related macular degeneration of right eye with active choroidal neovascularization (HCC)  H35.3211 OCT, Retina - OU - Both Eyes    Intravitreal Injection, Pharmacologic Agent - OD - Right Eye    Bevacizumab (AVASTIN) SOLN 1.25 mg  2. Macular pigment epithelial detachment, left  H35.722 Intravitreal Injection, Pharmacologic Agent - OD - Right Eye    Bevacizumab (AVASTIN) SOLN 1.25 mg  3. Cystoid macular edema of right eye  H35.351     1.  Repeat injection intravitreal Avastin OD today, and examination in 3 months, goal is stabilization and prevention of enlargement 2.  Subfoveal pigment epithelial detachment left eye stable now for 5 years will continue to observe  3.  Ophthalmic Meds Ordered this visit:  Meds ordered this encounter  Medications  . Bevacizumab (AVASTIN) SOLN 1.25 mg       Return in about 3 months (around 01/18/2021) for DILATE OU, AVASTIN OCT, OD.  There are no Patient Instructions on file for this visit.   Explained the diagnoses, plan, and follow up with the patient and they expressed understanding.  Patient expressed understanding of the importance of proper follow up  care.   Alford Highland. Shrinika Blatz M.D. Diseases & Surgery of the Retina and Vitreous [facility] 10/19/20     Abbreviations: M myopia (nearsighted); A astigmatism; H hyperopia (farsighted); P presbyopia; Mrx spectacle prescription;  CTL contact lenses; OD right eye; OS left eye; OU both eyes  XT exotropia; ET esotropia; PEK punctate epithelial  keratitis; PEE punctate epithelial erosions; DES dry eye syndrome; MGD meibomian gland dysfunction; ATs artificial tears; PFAT's preservative free artificial tears; NSC nuclear sclerotic cataract; PSC posterior subcapsular cataract; ERM epi-retinal membrane; PVD posterior vitreous detachment; RD retinal detachment; DM diabetes mellitus; DR diabetic retinopathy; NPDR non-proliferative diabetic retinopathy; PDR proliferative diabetic retinopathy; CSME clinically significant macular edema; DME diabetic macular edema; dbh dot blot hemorrhages; CWS cotton wool spot; POAG primary open angle glaucoma; C/D cup-to-disc ratio; HVF humphrey visual field; GVF goldmann visual field; OCT optical coherence tomography; IOP intraocular pressure; BRVO Branch retinal vein occlusion; CRVO central retinal vein occlusion; CRAO central retinal artery occlusion; BRAO branch retinal artery occlusion; RT retinal tear; SB scleral buckle; PPV pars plana vitrectomy; VH Vitreous hemorrhage; PRP panretinal laser photocoagulation; IVK intravitreal kenalog; VMT vitreomacular traction; MH Macular hole;  NVD neovascularization of the disc; NVE neovascularization elsewhere; AREDS age related eye disease study; ARMD age related macular degeneration; POAG primary open angle glaucoma; EBMD epithelial/anterior basement membrane dystrophy; ACIOL anterior chamber intraocular lens; IOL intraocular lens; PCIOL posterior chamber intraocular lens; Phaco/IOL phacoemulsification with intraocular lens placement; PRK photorefractive keratectomy; LASIK laser assisted in situ keratomileusis; HTN hypertension; DM diabetes mellitus; COPD chronic obstructive pulmonary disease

## 2020-10-19 NOTE — Assessment & Plan Note (Signed)
Chronically active OD secondary to CNVM

## 2020-10-19 NOTE — Assessment & Plan Note (Signed)
Stable now for over 5 years, no therapy during that interval

## 2020-11-19 ENCOUNTER — Non-Acute Institutional Stay (SKILLED_NURSING_FACILITY): Payer: Medicare PPO | Admitting: Nurse Practitioner

## 2020-11-19 ENCOUNTER — Encounter: Payer: Self-pay | Admitting: Nurse Practitioner

## 2020-11-19 DIAGNOSIS — M810 Age-related osteoporosis without current pathological fracture: Secondary | ICD-10-CM | POA: Diagnosis not present

## 2020-11-19 DIAGNOSIS — F0150 Vascular dementia without behavioral disturbance: Secondary | ICD-10-CM | POA: Diagnosis not present

## 2020-11-19 DIAGNOSIS — E871 Hypo-osmolality and hyponatremia: Secondary | ICD-10-CM

## 2020-11-19 DIAGNOSIS — I10 Essential (primary) hypertension: Secondary | ICD-10-CM | POA: Diagnosis not present

## 2020-11-19 DIAGNOSIS — K5909 Other constipation: Secondary | ICD-10-CM | POA: Diagnosis not present

## 2020-11-19 DIAGNOSIS — E782 Mixed hyperlipidemia: Secondary | ICD-10-CM | POA: Diagnosis not present

## 2020-11-19 DIAGNOSIS — I480 Paroxysmal atrial fibrillation: Secondary | ICD-10-CM

## 2020-11-19 DIAGNOSIS — R1310 Dysphagia, unspecified: Secondary | ICD-10-CM | POA: Diagnosis not present

## 2020-11-19 NOTE — Assessment & Plan Note (Signed)
HTN, blood pressure is controlled on Diltiazem, ASA 81mg  qd.

## 2020-11-19 NOTE — Progress Notes (Signed)
Location:   SNF [place] Room Number: 25-A Place of Service:  SNF (31) Provider: Lennie Odor Amias Hutchinson NP  Kiara Keep X, NP  Patient Care Team: Pepper Kerrick X, NP as PCP - General (Internal Medicine) Nahser, Wonda Cheng, MD as PCP - Cardiology (Cardiology) Melina Schools, MD as Consulting Physician (Orthopedic Surgery) Leonie Pacen Watford, MD as Consulting Physician (Cardiology) Milah Recht X, NP as Nurse Practitioner (Internal Medicine)  Extended Emergency Contact Information Primary Emergency Contact: [name],Yuna Address: [place] [address] Johnnette Litter [place] Phone: [phone] Mobile Phone: [phone] Relation: Niece Secondary Emergency Contact: [name],[name]  [place] Mobile Phone: [phone] Relation: Niece  Code Status: DNR Goals of care: Advanced Directive information Advanced Directives 06/03/2020  Does Patient Have a Medical Advance Directive? Yes  Type of Advance Directive Out of facility DNR (pink MOST or yellow form)  Does patient want to make changes to medical advance directive? No - Patient declined  Copy of [place] in Chart? -  Would patient like information on creating a medical advance directive? No - Patient declined  Pre-existing out of facility DNR order (yellow form or pink MOST form) Pink MOST/Yellow Form most recent copy in chart - Physician notified to receive inpatient order     Chief Complaint  Patient presents with  . Acute Visit    Medication review    HPI:  Pt is a 86 y.o. female seen today for an acute visit for   ED 06/03/20 for tachycardia junctional vs AFib, vent rate 124 bpm, resolved in ED, takes Diltiazem, no OAC             The patient resides in SNF  [facility] for safety, care assistance, on Memantine 65m bid for memory.  HTN, blood pressure is controlled on Diltiazem, ASA 865mqd.  Hyperlipidemia, takes Atorvastatin 1011md.  OP, takes  Ca, Vit D Constipation,takes Colace bid, MiraLax qod.              Hyponatremia, Na 135 09/11/20   Past Medical History:  Diagnosis Date  . Arteriosclerotic cardiovascular disease   . Bee sting reaction 08/23/2016   Tongue swelling and difficulty swallowing /notes 08/23/2016  . Constipation   . Degenerative joint disease of right hip 10/15/14  . Fracture of multiple pubic rami (HCC) 10/15/14   Right inferior and superior pubic rami  . HOH (hard of hearing)    Wears bilateral hearing aids  . Hyperlipidemia   . Hypertension   . Macular degeneration   . Memory deficit   . Osteoporosis   . Pain in right foot   . Palpitations   . Paroxysmal atrial fibrillation (HCC)   . Thyroid nodule    Status post surgery  . Ulcer of hard palate   . Vertigo    Past Surgical History:  Procedure Laterality Date  . ABDOMINAL HYSTERECTOMY  1968  . APPENDECTOMY  1940's  . COLONOSCOPY    . FOOT SURGERY Left   . HARDWARE REMOVAL Right 02/16/2016   Procedure: HARDWARE REMOVAL RT HIP;  Surgeon: TimRenette ButtersD;  [facility];  Laterality: Right;  . HIP ARTHROPLASTY Right 02/16/2016   Procedure: ARTHROPLASTY BIPOLAR HIP (HEMIARTHROPLASTY);  Surgeon: TimRenette ButtersD;  [facility] [place] [place];  Laterality: Right;  . INTRAMEDULLARY (IM) NAIL INTERTROCHANTERIC Right 09/11/2015   Procedure: INTRAMEDULLARY (IM) NAIL INTERTROCHANTRIC;  Surgeon:  Renette Butters, MD;  Location: [place];  Service: Orthopedics;  Laterality: Right;  . KNEE SURGERY      No Known Allergies  Allergies as of 11/19/2020   No Known Allergies     Medication List       Accurate as of November 19, 2020 11:59 PM. If you have any questions, ask your nurse or doctor.        aspirin EC 81 MG tablet Take 81 mg by mouth daily. Swallow whole.   atorvastatin 10 MG tablet Commonly known as: LIPITOR Take 10 mg by mouth daily.   Biotin 10 MG Tabs Take by mouth  daily. What changed: Another medication with the same name was removed. Continue taking this medication, and follow the directions you see here. Changed by: Karna Abed X Shatia Sindoni, NP   calcium carbonate 1500 (600 Ca) MG Tabs tablet Commonly known as: OSCAL Take 600 mg of elemental calcium by mouth 2 (two) times daily with a meal.   diltiazem 180 MG 24 hr capsule Commonly known as: CARDIZEM CD Take 1 capsule (180 mg total) by mouth daily.   docusate sodium 100 MG capsule Commonly known as: COLACE Take 100 mg by mouth 2 (two) times daily.   EPINEPHrine 0.3 mg/0.3 mL Soaj injection Commonly known as: EPI-PEN Inject 0.3 mLs (0.3 mg total) into the muscle once as needed (anaphylaxis/allergic reaction).   lactose free nutrition Liqd Take 237 mLs by mouth. Once a day at breakfast   meclizine 12.5 MG tablet Commonly known as: ANTIVERT Take 12.5 mg by mouth daily as needed for dizziness.   melatonin 3 MG Tabs tablet Take 3 mg by mouth at bedtime as needed (sleep).   memantine 10 MG tablet Commonly known as: NAMENDA Take 10 mg by mouth 2 (two) times daily.   polyethylene glycol 17 g packet Commonly known as: MIRALAX / GLYCOLAX Take 17 g by mouth every other day.   Therems-M Tabs Take 1 tablet by mouth daily.   PreserVision AREDS Tabs Take 2 tablets by mouth daily.   Vitamin D3 50 MCG (2000 UT) Tabs Take 2,000 Units by mouth daily.       Review of Systems  Constitutional: Negative for appetite change, fatigue and fever.  HENT: Positive for hearing loss. Negative for congestion and voice change.        Hears better left ear  Respiratory: Negative for cough and shortness of breath.   Cardiovascular: Negative for leg swelling.  Gastrointestinal: Negative for abdominal pain, constipation, nausea and vomiting.  Genitourinary: Negative for difficulty urinating, dysuria and urgency.  Musculoskeletal: Positive for gait problem.  Skin: Negative for color change.  Neurological: Positive  for dizziness. Negative for tremors, syncope, speech difficulty, weakness, light-headedness and headaches.       Memory lapses. Chronic on and off dizziness  Psychiatric/Behavioral: Positive for confusion. Negative for behavioral problems and sleep disturbance. The patient is not nervous/anxious.     Immunization History  Administered Date(s) Administered  . Influenza Whole 08/25/2018  . Influenza, High Dose Seasonal PF 08/24/2019  . Influenza-Unspecified 09/07/2016, 09/12/2017  . Moderna Sars-Covid-2 Vaccination 11/23/2019, 12/21/2019  . PPD Test 02/18/2016  . Pneumococcal Conjugate-13 09/18/2017  . Pneumococcal-Unspecified 10/22/2018  . Tdap 10/07/2012   Pertinent  Health Maintenance Due  Topic Date Due  . INFLUENZA VACCINE  06/21/2020  . DEXA SCAN  Completed  . PNA vac Low Risk Adult  Completed   Fall Risk  08/17/2018 08/03/2017 10/24/2014  Falls in the past year? No  No Yes  Number falls in past yr: - - 1  Injury with Fall? - - Yes  Risk Factor Category  - - High Fall Risk  Risk for fall due to : - - History of fall(s)   Functional Status Survey:    Vitals:   11/23/20 1347  BP: 110/60  Pulse: 60  Resp: 18  Temp: 97.6 F (36.4 C)  SpO2: 98%   There is no height or weight on file to calculate BMI. Physical Exam Vitals and nursing note reviewed.  Constitutional:      Appearance: Normal appearance. She is normal weight.  HENT:     Head: Normocephalic and atraumatic.     Nose: Nose normal.     Mouth/Throat:     Mouth: Mucous membranes are moist.  Eyes:     Extraocular Movements: Extraocular movements intact.     Conjunctiva/sclera: Conjunctivae normal.     Pupils: Pupils are equal, round, and reactive to light.  Cardiovascular:     Rate and Rhythm: Normal rate and regular rhythm.     Heart sounds: No murmur heard.   Pulmonary:     Effort: Pulmonary effort is normal.     Breath sounds: No wheezing, rhonchi or rales.  Abdominal:     General: Bowel sounds are  normal.     Palpations: Abdomen is soft.     Tenderness: There is no abdominal tenderness. There is no right CVA tenderness, left CVA tenderness, guarding or rebound.  Musculoskeletal:     Cervical back: Normal range of motion and neck supple.     Right lower leg: No edema.     Left lower leg: No edema.  Skin:    General: Skin is warm and dry.  Neurological:     General: No focal deficit present.     Mental Status: She is alert. Mental status is at baseline.     Gait: Gait abnormal.     Comments: Oriented to person, place.   Psychiatric:        Mood and Affect: Mood normal.        Behavior: Behavior normal.        Thought Content: Thought content normal.     Labs reviewed: Recent Labs    03/17/20 0000 06/03/20 0952 09/11/20 0000  NA 131* 131* 135*  K 4.1 3.7 4.3  CL 100 99 102  CO2 24* 21* 26*  GLUCOSE  --  107*  --   BUN _0 CREATININE 0.7 0.59 0.8  CALCIUM 10.2 9.8 10.0   Recent Labs    03/05/20 0000 06/03/20 0952 09/11/20 0000  AST _1 ALT _2 ALKPHOS 54 51 52  BILITOT  --  0.9  --   PROT  --  7.1  --   ALBUMIN 4.0 4.1 3.9   Recent Labs    03/05/20 0000 06/03/20 1022 09/11/20 0000  WBC 5.8 6.5 6.2  NEUTROABS 818 5.3 4,365.00  HGB 13.5 13.2 12.8  HCT 39 39.2 37  MCV  --  94.2  --   PLT 254 266 281   Lab Results  Component Value Date   TSH 2.703 06/03/2020   Lab Results  Component Value Date   HGBA1C 5.4 03/14/2018   Lab Results  Component Value Date   CHOL 183 03/05/2020   HDL 70 03/05/2020   LDLCALC 94 03/05/2020   TRIG 97 03/05/2020   CHOLHDL 2.6 03/14/2018    Significant Diagnostic  Results in last 30 days:  No results found.  Assessment/Plan: PAF (paroxysmal atrial fibrillation) ([place]) ED 06/03/20 for tachycardia junctional vs AFib, vent rate 124 bpm, resolved in ED, takes Diltiazem, no OAC   Vascular dementia [facility]) The patient resides in SNF  [facility] for safety, care assistance, on Memantine 50m bid for memory.  11/19/20 MOST DNR, limited additional interventions, transfer to hospital if indicated, avoid intensive care, ABT/IVF if indicated. Update CBC/diff, CMP/eGFR.    Essential hypertension HTN, blood pressure is controlled on Diltiazem, ASA 811mqd.   Hyperlipidemia Hyperlipidemia, takes Atorvastatin 1056md.    Osteoporosis without current pathological fracture OP, takes Ca, Vit D   Chronic constipation Constipation,takes Colace bid, MiraLax qod.    Hyponatremia Hyponatremia, Na 135 09/11/20. Update CMP/eGFR     Family/ staff Communication: plan of care reviewed with the patient and charge nurse.   Labs/tests ordered:  CBC/diff, CMP/eGFR  Time spend 35 minutes.

## 2020-11-19 NOTE — Assessment & Plan Note (Signed)
Hyponatremia, Na 135 09/11/20. Update CMP/eGFR

## 2020-11-19 NOTE — Assessment & Plan Note (Addendum)
The patient resides in SNF  [facility] for safety, care assistance, on Memantine $RemoveBefo'10mg'[id number]$  bid for memory. 11/19/20 MOST DNR, limited additional interventions, transfer to hospital if indicated, avoid intensive care, ABT/IVF if indicated. Update CBC/diff, CMP/eGFR.

## 2020-11-19 NOTE — Assessment & Plan Note (Signed)
Hyperlipidemia, takes Atorvastatin 10mg qd  

## 2020-11-19 NOTE — Assessment & Plan Note (Signed)
OP, takes Ca, Vit D

## 2020-11-19 NOTE — Assessment & Plan Note (Signed)
ED 06/03/20 for tachycardia junctional vs AFib, vent rate 124 bpm, resolved in ED, takes Diltiazem, no OAC

## 2020-11-19 NOTE — Assessment & Plan Note (Signed)
Constipation, takes Colace bid, MiraLax qod.  

## 2020-11-23 ENCOUNTER — Encounter: Payer: Self-pay | Admitting: Nurse Practitioner

## 2020-11-24 ENCOUNTER — Non-Acute Institutional Stay (SKILLED_NURSING_FACILITY): Payer: Medicare PPO | Admitting: Internal Medicine

## 2020-11-24 ENCOUNTER — Encounter: Payer: Self-pay | Admitting: Internal Medicine

## 2020-11-24 DIAGNOSIS — F0150 Vascular dementia without behavioral disturbance: Secondary | ICD-10-CM | POA: Diagnosis not present

## 2020-11-24 DIAGNOSIS — D509 Iron deficiency anemia, unspecified: Secondary | ICD-10-CM | POA: Diagnosis not present

## 2020-11-24 DIAGNOSIS — M810 Age-related osteoporosis without current pathological fracture: Secondary | ICD-10-CM | POA: Diagnosis not present

## 2020-11-24 DIAGNOSIS — E782 Mixed hyperlipidemia: Secondary | ICD-10-CM | POA: Diagnosis not present

## 2020-11-24 DIAGNOSIS — I10 Essential (primary) hypertension: Secondary | ICD-10-CM | POA: Diagnosis not present

## 2020-11-24 DIAGNOSIS — I480 Paroxysmal atrial fibrillation: Secondary | ICD-10-CM

## 2020-11-24 DIAGNOSIS — H8110 Benign paroxysmal vertigo, unspecified ear: Secondary | ICD-10-CM | POA: Diagnosis not present

## 2020-11-24 LAB — CBC AND DIFFERENTIAL
HCT: 38 (ref 36–46)
Hemoglobin: 12.9 (ref 12.0–16.0)
Neutrophils Absolute: 4518
Platelets: 292 (ref 150–399)
WBC: 6.4

## 2020-11-24 LAB — COMPREHENSIVE METABOLIC PANEL
Albumin: 3.9 (ref 3.5–5.0)
Calcium: 9.5 (ref 8.7–10.7)
Globulin: 2.7

## 2020-11-24 LAB — BASIC METABOLIC PANEL
BUN: 21 (ref 4–21)
CO2: 24 — AB (ref 13–22)
Chloride: 103 (ref 99–108)
Creatinine: 0.7 (ref 0.5–1.1)
Glucose: 110
Potassium: 4.2 (ref 3.4–5.3)
Sodium: 135 — AB (ref 137–147)

## 2020-11-24 LAB — CBC: RBC: 4.18 (ref 3.87–5.11)

## 2020-11-24 LAB — HEPATIC FUNCTION PANEL
ALT: 14 (ref 7–35)
AST: 19 (ref 13–35)
Alkaline Phosphatase: 57 (ref 25–125)
Bilirubin, Total: 0.5

## 2020-11-24 NOTE — Progress Notes (Signed)
Provider:   Location:  [facility] of Service:  SNF (31)  PCP: Mast, Man X, NP Patient Care Team: Mast, Man X, NP as PCP - General (Internal Medicine) Nahser, Wonda Cheng, MD as PCP - Cardiology (Cardiology) Melina Schools, MD as Consulting Physician (Orthopedic Surgery) Leonie Man, MD as Consulting Physician (Cardiology) Mast, Man X, NP as Nurse Practitioner (Internal Medicine)  Extended Emergency Contact Information Primary Emergency Contact: [name],Uma Address: [place] [address] Johnnette Litter of [place] Phone: [phone] Mobile Phone: [phone] Relation: Niece Secondary Emergency Contact: [name],[name]  [place] Mobile Phone: [phone] Relation: Niece  Code Status:  Goals of Care: Advanced Directive information Advanced Directives 06/03/2020  Does Patient Have a Medical Advance Directive? Yes  Type of Advance Directive Out of facility DNR (pink MOST or yellow form)  Does patient want to make changes to medical advance directive? No - Patient declined  Copy of [place] in Chart? -  Would patient like information on creating a medical advance directive? No - Patient declined  Pre-existing out of facility DNR order (yellow form or pink MOST form) Pink MOST/Yellow Form most recent copy in chart - Physician notified to receive inpatient order      Chief Complaint  Patient presents with  . New Admit To SNF    HPI: Patient is a 87 y.o. female seen today for admission to SNF for long term care  She has h/o Hypertension, Hyperlipidemia,  Cognitive Impairment,  BPPV, Vit D def, Hyponatremia,   Macular degeneration Went to ED in 7/21 for ? A Fib But Dr Cathie Olden thinks it was Accelerated Junctional Rhythm. Norvasc stopped and started on Cardizem. No Anticoagulation  Now Admitted to SNF as was wandering and getting lost in [place]. Needing more help with her ADLS Other wise doing well.  No New complains No New Nursing issues Walks with her walker Weight is stable Has Dementia . Very hard of hearing Follows commands. But stays confused    Past Medical History:  Diagnosis Date  . Arteriosclerotic cardiovascular disease   . Bee sting reaction 08/23/2016   Tongue swelling and difficulty swallowing /notes 08/23/2016  . Constipation   . Degenerative joint disease of right hip 10/15/14  . Fracture of multiple pubic rami (HCC) 10/15/14   Right inferior and superior pubic rami  . HOH (hard of hearing)    Wears bilateral hearing aids  . Hyperlipidemia   . Hypertension   . Macular degeneration   . Memory deficit   . Osteoporosis   . Pain in right foot   . Palpitations   . Paroxysmal atrial fibrillation (HCC)   . Thyroid nodule    Status post surgery  . Ulcer of hard palate   . Vertigo    Past Surgical History:  Procedure Laterality Date  . ABDOMINAL HYSTERECTOMY  1968  . APPENDECTOMY  1940's  . COLONOSCOPY    . FOOT SURGERY Left   . HARDWARE REMOVAL Right 02/16/2016   Procedure: HARDWARE REMOVAL RT HIP;  Surgeon: Renette Butters, MD;  Location: [facility];  Service: Orthopedics;  Laterality: Right;  . HIP ARTHROPLASTY Right 02/16/2016   Procedure: ARTHROPLASTY BIPOLAR HIP (HEMIARTHROPLASTY);  Surgeon: Renette Butters, MD;  Location: [facility];  Service: Orthopedics;  Laterality: Right;  . INTRAMEDULLARY (IM) NAIL INTERTROCHANTERIC Right 09/11/2015   Procedure: INTRAMEDULLARY (IM) NAIL INTERTROCHANTRIC;  Surgeon: Renette Butters,  MD;  [facility];  Service: Orthopedics;  Laterality: Right;  . KNEE SURGERY      reports that she quit smoking about 49 years ago. Her smoking use included cigarettes. She smoked 0.50 packs per day. She has never used smokeless tobacco. She reports current alcohol use. She reports that she does not use drugs. Social History   Socioeconomic History  . Marital status: Single    Spouse name: Not on file  . Number of children: 0  . Years  of education: 16+  . Highest education level: Not on file  Occupational History  . Occupation: Retired   Tobacco Use  . Smoking status: Former Smoker    Packs/day: 0.50    Types: Cigarettes    Quit date: 10/05/1971    Years since quitting: 49.1  . Smokeless tobacco: Never Used  Vaping Use  . Vaping Use: Never used  Substance and Sexual Activity  . Alcohol use: Yes    Comment: 1-4 per week; eats 10 raisins daily soaked in Gin  . Drug use: No  . Sexual activity: Never  Other Topics Concern  . Not on file  Social History Narrative   [facility] at [facility]. Moved to [place] 07/06/16   Never married   Caffeine use: 2 cups coffee per day   No tea/soda   Former smoker 1972   Alcohol 1-4 per week. Eats 10 raisins daily soaked in Gin   DNR   Social Determinants of Health   Financial Resource Strain: Not on file  Food Insecurity: Not on file  Transportation Needs: Not on file  Physical Activity: Not on file  Stress: Not on file  Social Connections: Not on file  Intimate Partner Violence: Not on file    Functional Status Survey:    Family History  Problem Relation Age of Onset  . Heart disease Father   . Cancer Father        prostate  . Cancer Maternal Grandmother        colon  . Heart disease Paternal Grandmother   . Hyperlipidemia Sister   . Cancer Sister        breast  . Heart disease Brother   . Hyperlipidemia Brother   . Cancer Brother        prostate    Health Maintenance  Topic Date Due  . COVID-19 Vaccine (3 - Booster for Moderna series) 06/19/2020  . INFLUENZA VACCINE  06/21/2020  . TETANUS/TDAP  10/07/2022  . DEXA SCAN  Completed  . PNA vac Low Risk Adult  Completed    No Known Allergies  Outpatient Encounter Medications as of 11/24/2020  Medication Sig  . aspirin EC 81 MG tablet Take 81 mg by mouth daily. Swallow whole.  Marland Kitchen atorvastatin (LIPITOR) 10 MG tablet Take 10 mg by mouth daily.  . Biotin 10 MG TABS Take by mouth daily.  . calcium  carbonate (OSCAL) 1500 (600 Ca) MG TABS tablet Take 600 mg of elemental calcium by mouth 2 (two) times daily with a meal.  . Cholecalciferol (VITAMIN D3) 2000 units TABS Take 2,000 Units by mouth daily.  Marland Kitchen diltiazem (CARDIZEM CD) 180 MG 24 hr capsule Take 1 capsule (180 mg total) by mouth daily.  Marland Kitchen docusate sodium (COLACE) 100 MG capsule Take 100 mg by mouth 2 (two) times daily.   Marland Kitchen EPINEPHrine 0.3 mg/0.3 mL IJ SOAJ injection Inject 0.3 mLs (0.3 mg total) into the muscle once as needed (anaphylaxis/allergic reaction).  . lactose free  nutrition (BOOST) LIQD Take 237 mLs by mouth. Once a day at breakfast  . meclizine (ANTIVERT) 12.5 MG tablet Take 12.5 mg by mouth daily as needed for dizziness.   . Melatonin 3 MG TABS Take 3 mg by mouth at bedtime as needed (sleep).  . memantine (NAMENDA) 10 MG tablet Take 10 mg by mouth 2 (two) times daily.   . Multiple Vitamins-Minerals (PRESERVISION AREDS) TABS Take 2 tablets by mouth daily.  . Multiple Vitamins-Minerals (THEREMS-M) TABS Take 1 tablet by mouth daily.  . polyethylene glycol (MIRALAX / GLYCOLAX) packet Take 17 g by mouth every other day.   No facility-administered encounter medications on file as of 11/24/2020.    Review of Systems  Denies everything not sure due to dementia  Vitals:   11/24/20 1425  BP: 122/66  Pulse: 60  Resp: 18  Temp: (!) 97.4 F (36.3 C)  Weight: 124 lb (56.2 kg)   Body mass index is 20.63 kg/m. Physical Exam Constitutional:  Well-developed and well-nourished.  HENT:  Head: Normocephalic.  Mouth/Throat: Oropharynx is clear and moist.  Eyes: Pupils are equal, round, and reactive to light.  Neck: Neck supple.  Cardiovascular: Normal rate and normal heart sounds.  No murmur heard. Pulmonary/Chest: Effort normal and breath sounds normal. No respiratory distress. No wheezes. She has no rales.  Abdominal: Soft. Bowel sounds are normal. No distension. There is no tenderness. There is no rebound.  Musculoskeletal:  No edema.  Lymphadenopathy: none Neurological:No Focal Deficits Skin: Skin is warm and dry.  Psychiatric: Normal mood and affect. Behavior is normal. Thought content normal.   Labs reviewed: Basic Metabolic Panel: Recent Labs    03/17/20 0000 06/03/20 0952 09/11/20 0000  NA 131* 131* 135*  K 4.1 3.7 4.3  CL 100 99 102  CO2 24* 21* 26*  GLUCOSE  --  107*  --   BUN 13 13 15   CREATININE 0.7 0.59 0.8  CALCIUM 10.2 9.8 10.0   Liver Function Tests: Recent Labs    03/05/20 0000 06/03/20 0952 09/11/20 0000  AST 20 26 19   ALT 13 19 14   ALKPHOS 54 51 52  BILITOT  --  0.9  --   PROT  --  7.1  --   ALBUMIN 4.0 4.1 3.9   No results for input(s): LIPASE, AMYLASE in the last 8760 hours. No results for input(s): AMMONIA in the last 8760 hours. CBC: Recent Labs    03/05/20 0000 06/03/20 1022 09/11/20 0000  WBC 5.8 6.5 6.2  NEUTROABS 818 5.3 4,365.00  HGB 13.5 13.2 12.8  HCT 39 39.2 37  MCV  --  94.2  --   PLT 254 266 281   Cardiac Enzymes: No results for input(s): CKTOTAL, CKMB, CKMBINDEX, TROPONINI in the last 8760 hours. BNP: Invalid input(s): POCBNP Lab Results  Component Value Date   HGBA1C 5.4 03/14/2018   Lab Results  Component Value Date   TSH 2.703 06/03/2020   No results found for: VITAMINB12 No results found for: FOLATE No results found for: IRON, TIBC, FERRITIN  Imaging and Procedures obtained prior to SNF admission: DG Chest Portable 1 View  Result Date: 06/03/2020 CLINICAL DATA:  episode of "yelling and shaking" while in the dining room. EXAM: PORTABLE CHEST 1 VIEW COMPARISON:  01/21/2019 FINDINGS: 1037 hours. Lungs are hyperexpanded. The lungs are clear without focal pneumonia, edema, pneumothorax or pleural effusion. Interstitial markings are diffusely coarsened with chronic features. The cardiopericardial silhouette is within normal limits for size. The visualized bony structures  of the thorax show now acute abnormality. Telemetry leads overlie the  chest. IMPRESSION: Chronic interstitial changes.  No acute findings. Electronically Signed   By: Kennith Center M.D.   On: 06/03/2020 10:54    Assessment/Plan 1. PAF (paroxysmal atrial fibrillation) (HCC) Doing well on Cardizem On Aspirin No Anticoagulation 2. Vascular dementia without behavioral disturbance (HCC) Needs more care now On Namenda  3. Essential hypertension Stable on CArdizem  4. Mixed hyperlipidemia Continue Statin Last LDL 94 in 4/21  5. Age-related osteoporosis without current pathological fracture Has Received Reclast before Continue Calcium and Vit D  6. Benign paroxysmal positional vertigo, unspecified laterality Meclizine PRN    Family/ staff Communication:   Labs/tests ordered:

## 2020-12-30 ENCOUNTER — Non-Acute Institutional Stay (SKILLED_NURSING_FACILITY): Payer: Medicare PPO | Admitting: Nurse Practitioner

## 2020-12-30 ENCOUNTER — Encounter: Payer: Self-pay | Admitting: Nurse Practitioner

## 2020-12-30 DIAGNOSIS — M810 Age-related osteoporosis without current pathological fracture: Secondary | ICD-10-CM

## 2020-12-30 DIAGNOSIS — H8110 Benign paroxysmal vertigo, unspecified ear: Secondary | ICD-10-CM | POA: Diagnosis not present

## 2020-12-30 DIAGNOSIS — E871 Hypo-osmolality and hyponatremia: Secondary | ICD-10-CM

## 2020-12-30 DIAGNOSIS — K5909 Other constipation: Secondary | ICD-10-CM | POA: Diagnosis not present

## 2020-12-30 DIAGNOSIS — E782 Mixed hyperlipidemia: Secondary | ICD-10-CM

## 2020-12-30 DIAGNOSIS — I10 Essential (primary) hypertension: Secondary | ICD-10-CM | POA: Diagnosis not present

## 2020-12-30 DIAGNOSIS — F0150 Vascular dementia without behavioral disturbance: Secondary | ICD-10-CM | POA: Diagnosis not present

## 2020-12-30 DIAGNOSIS — I480 Paroxysmal atrial fibrillation: Secondary | ICD-10-CM

## 2020-12-30 NOTE — Assessment & Plan Note (Signed)
heart rate is in control, takes Diltiazem, ASA, Hgb 12.9 11/24/20

## 2020-12-30 NOTE — Assessment & Plan Note (Signed)
takes prn Meclizine °

## 2020-12-30 NOTE — Assessment & Plan Note (Signed)
takes Colace bid, MiraLax qod.

## 2020-12-30 NOTE — Assessment & Plan Note (Signed)
takes Ca, Vit D, has been on Reclast before.

## 2020-12-30 NOTE — Assessment & Plan Note (Signed)
Na 135 11/24/20

## 2020-12-30 NOTE — Progress Notes (Signed)
Location:   [facility] Room Number: 25 Place of Service:  SNF (31) Provider:  Chipper Oman NP  Myalynn Lingle X, NP  Patient Care Team: Brianda Beitler X, NP as PCP - General (Internal Medicine) Nahser, Deloris Ping, MD as PCP - Cardiology (Cardiology) Venita Lick, MD as Consulting Physician (Orthopedic Surgery) Marykay Lex, MD as Consulting Physician (Cardiology) Abbey Veith X, NP as Nurse Practitioner (Internal Medicine)  Extended Emergency Contact Information Primary Emergency Contact: [name],Luretha Address: [address]           [place], [place] [address] Darden Amber of [place] Home Phone: [phone] Mobile Phone: [phone] Relation: Niece Secondary Emergency Contact: [name],[name]  [place] Mobile Phone: [phone] Relation: Niece  Code Status:  DNR Goals of care: Advanced Directive information Advanced Directives 06/03/2020  Does Patient Have a Medical Advance Directive? Yes  Type of Advance Directive Out of facility DNR (pink MOST or yellow form)  Does patient want to make changes to medical advance directive? No - Patient declined  Copy of Healthcare Power of Attorney in Chart? -  Would patient like information on creating a medical advance directive? No - Patient declined  Pre-existing out of facility DNR order (yellow form or pink MOST form) Pink MOST/Yellow Form most recent copy in chart - Physician notified to receive inpatient order     Chief Complaint  Patient presents with  . Medical Management of Chronic Issues    HPI:  Pt is a 87 y.o. female seen today for medical management of chronic diseases.      AFib, heart rate is in control, takes Diltiazem, ASA, Hgb 12.9 11/24/20 The patient resides in [facility] for safety, care assistance, on Memantine 10mg  bid for memory.  HTN, blood pressure is controlled on Diltiazem, ASA 81mg  qd. Bun/creat 21/0.7 11/24/20 Hyperlipidemia, takes Atorvastatin  10mg  qd. LDL 94 03/05/20 OP, takes Ca, Vit D, has been Reclast before.  Constipation, takes Colace bid, MiraLax qod. Hyponatremia, Na 135 11/24/20  BPPV, takes prn Meclizine.   Past Medical History:  Diagnosis Date  . Arteriosclerotic cardiovascular disease   . Bee sting reaction 08/23/2016   Tongue swelling and difficulty swallowing /notes 08/23/2016  . Constipation   . Degenerative joint disease of right hip 10/15/14  . Fracture of multiple pubic rami (HCC) 10/15/14   Right inferior and superior pubic rami  . HOH (hard of hearing)    Wears bilateral hearing aids  . Hyperlipidemia   . Hypertension   . Macular degeneration   . Memory deficit   . Osteoporosis   . Pain in right foot   . Palpitations   . Paroxysmal atrial fibrillation (HCC)   . Thyroid nodule    Status post surgery  . Ulcer of hard palate   . Vertigo    Past Surgical History:  Procedure Laterality Date  . ABDOMINAL HYSTERECTOMY  1968  . APPENDECTOMY  1940's  . COLONOSCOPY    . FOOT SURGERY Left   . HARDWARE REMOVAL Right 02/16/2016   Procedure: HARDWARE REMOVAL RT HIP;  Surgeon: 10/17/14, MD;  Location: [facility] OR;  Service: Orthopedics;  Laterality: Right;  . HIP ARTHROPLASTY Right 02/16/2016   Procedure: ARTHROPLASTY BIPOLAR HIP (HEMIARTHROPLASTY);  Surgeon: Sheral Apley, MD;  Location: [facility] OR;  Service: Orthopedics;  Laterality: Right;  . INTRAMEDULLARY (IM) NAIL INTERTROCHANTERIC Right 09/11/2015   Procedure: INTRAMEDULLARY (IM) NAIL INTERTROCHANTRIC;  Surgeon: Sheral Apley, MD;  Location: [facility] OR;  Service: Orthopedics;  Laterality: Right;  . KNEE SURGERY      No Known Allergies  Allergies as of 12/30/2020   No Known Allergies     Medication List       Accurate as of December 30, 2020 11:59 PM. If you have any questions, ask your nurse or doctor.        aspirin EC 81 MG tablet Take 81 mg by mouth daily. Swallow whole.   atorvastatin 10 MG  tablet Commonly known as: LIPITOR Take 10 mg by mouth daily.   Biotin 10 MG Tabs Take by mouth daily.   calcium carbonate 1500 (600 Ca) MG Tabs tablet Commonly known as: OSCAL Take 600 mg of elemental calcium by mouth 2 (two) times daily with a meal.   diltiazem 180 MG 24 hr capsule Commonly known as: CARDIZEM CD Take 1 capsule (180 mg total) by mouth daily.   docusate sodium 100 MG capsule Commonly known as: COLACE Take 100 mg by mouth 2 (two) times daily.   EPINEPHrine 0.3 mg/0.3 mL Soaj injection Commonly known as: EPI-PEN Inject 0.3 mLs (0.3 mg total) into the muscle once as needed (anaphylaxis/allergic reaction).   lactose free nutrition Liqd Take 237 mLs by mouth. Once a day at breakfast   meclizine 12.5 MG tablet Commonly known as: ANTIVERT Take 12.5 mg by mouth daily as needed for dizziness.   melatonin 3 MG Tabs tablet Take 3 mg by mouth at bedtime as needed (sleep).   memantine 10 MG tablet Commonly known as: NAMENDA Take 10 mg by mouth 2 (two) times daily.   polyethylene glycol 17 g packet Commonly known as: MIRALAX / GLYCOLAX Take 17 g by mouth every other day.   Therems-M Tabs Take 1 tablet by mouth daily.   PreserVision AREDS Tabs Take 2 tablets by mouth daily.   Vitamin D3 50 MCG (2000 UT) Tabs Take 2,000 Units by mouth daily.       Review of Systems  Constitutional: Negative for fatigue, fever and unexpected weight change.  HENT: Positive for hearing loss. Negative for congestion and voice change.        Hears better left ear  Respiratory: Negative for cough and shortness of breath.   Cardiovascular: Negative for leg swelling.  Gastrointestinal: Negative for abdominal pain and constipation.  Genitourinary: Negative for dysuria and urgency.  Musculoskeletal: Positive for gait problem.  Skin: Negative for color change.  Neurological: Positive for dizziness. Negative for speech difficulty, weakness, light-headedness and headaches.        Memory lapses. Chronic on and off dizziness  Psychiatric/Behavioral: Positive for confusion. Negative for behavioral problems and sleep disturbance. The patient is not nervous/anxious.     Immunization History  Administered Date(s) Administered  . Influenza Whole 08/25/2018  . Influenza, High Dose Seasonal PF 08/24/2019  . Influenza-Unspecified 09/07/2016, 09/12/2017, 09/02/2020  . Moderna Sars-Covid-2 Vaccination 11/23/2019, 12/21/2019, 09/29/2020  . PPD Test 02/18/2016  . Pneumococcal Conjugate-13 09/18/2017  . Pneumococcal-Unspecified 10/22/2018  . Tdap 10/07/2012   Pertinent  Health Maintenance Due  Topic Date Due  . INFLUENZA VACCINE  Completed  . DEXA SCAN  Completed  . PNA vac Low Risk Adult  Completed   Fall Risk  08/17/2018 08/03/2017 10/24/2014  Falls in the past year? No No Yes  Number falls in past yr: - - 1  Injury with Fall? - - Yes  Risk Factor Category  - - High Fall Risk  Risk for fall due to : - - History of fall(s)  Functional Status Survey:    Vitals:   12/30/20 0914  BP: 122/60  Pulse: 64  Temp: (!) 97.3 F (36.3 C)  SpO2: 95%  Weight: 122 lb 1.6 oz (55.4 kg)  Height: 5\' 5"  (1.651 m)   Body mass index is 20.32 kg/m. Physical Exam Vitals and nursing note reviewed.  Constitutional:      Appearance: Normal appearance. She is normal weight.  HENT:     Head: Normocephalic and atraumatic.     Mouth/Throat:     Mouth: Mucous membranes are moist.  Eyes:     Extraocular Movements: Extraocular movements intact.     Conjunctiva/sclera: Conjunctivae normal.     Pupils: Pupils are equal, round, and reactive to light.  Cardiovascular:     Rate and Rhythm: Normal rate and regular rhythm.     Heart sounds: No murmur heard.   Pulmonary:     Effort: Pulmonary effort is normal.     Breath sounds: No rales.  Abdominal:     General: Bowel sounds are normal.     Palpations: Abdomen is soft.     Tenderness: There is no abdominal tenderness.   Musculoskeletal:     Cervical back: Normal range of motion and neck supple.     Right lower leg: No edema.     Left lower leg: No edema.  Skin:    General: Skin is warm and dry.  Neurological:     General: No focal deficit present.     Mental Status: She is alert. Mental status is at baseline.     Gait: Gait abnormal.     Comments: Oriented to person, place.   Psychiatric:        Mood and Affect: Mood normal.        Behavior: Behavior normal.        Thought Content: Thought content normal.     Labs reviewed: Recent Labs    06/03/20 0952 09/11/20 0000 11/24/20 0000  NA 131* 135* 135*  K 3.7 4.3 4.2  CL 99 102 103  CO2 21* 26* 24*  GLUCOSE 107*  --   --   BUN 13 15 21   CREATININE 0.59 0.8 0.7  CALCIUM 9.8 10.0 9.5   Recent Labs    06/03/20 0952 09/11/20 0000 11/24/20 0000  AST 26 19 19   ALT 19 14 14   ALKPHOS 51 52 57  BILITOT 0.9  --   --   PROT 7.1  --   --   ALBUMIN 4.1 3.9 3.9   Recent Labs    06/03/20 1022 09/11/20 0000 11/24/20 0000  WBC 6.5 6.2 6.4  NEUTROABS 5.3 4,365.00 4,518.00  HGB 13.2 12.8 12.9  HCT 39.2 37 38  MCV 94.2  --   --   PLT 266 281 292   Lab Results  Component Value Date   TSH 2.703 06/03/2020   Lab Results  Component Value Date   HGBA1C 5.4 03/14/2018   Lab Results  Component Value Date   CHOL 183 03/05/2020   HDL 70 03/05/2020   LDLCALC 94 03/05/2020   TRIG 97 03/05/2020   CHOLHDL 2.6 03/14/2018    Significant Diagnostic Results in last 30 days:  No results found.  Assessment/Plan There are no diagnoses linked to this encounter.   Family/ staff Communication:   Labs/tests ordered:

## 2020-12-30 NOTE — Assessment & Plan Note (Signed)
blood pressure is controlled on Diltiazem, ASA 81mg  qd. Bun/creat 21/0.7 11/24/20

## 2020-12-30 NOTE — Assessment & Plan Note (Signed)
takes Atorvastatin 10mg  qd. LDL 94 03/05/20

## 2020-12-30 NOTE — Assessment & Plan Note (Signed)
The patient resides in SNF  [facility] for safety, care assistance, on Memantine 10mg  bid for memory.

## 2020-12-31 ENCOUNTER — Encounter: Payer: Self-pay | Admitting: Nurse Practitioner

## 2021-01-07 DIAGNOSIS — I10 Essential (primary) hypertension: Secondary | ICD-10-CM | POA: Diagnosis not present

## 2021-01-07 LAB — LIPID PANEL
Cholesterol: 178 (ref 0–200)
HDL: 65 (ref 35–70)
LDL Cholesterol: 88
LDl/HDL Ratio: 2.7
Triglycerides: 145 (ref 40–160)

## 2021-01-18 ENCOUNTER — Encounter (INDEPENDENT_AMBULATORY_CARE_PROVIDER_SITE_OTHER): Payer: Self-pay | Admitting: Ophthalmology

## 2021-01-18 ENCOUNTER — Other Ambulatory Visit: Payer: Self-pay

## 2021-01-18 ENCOUNTER — Ambulatory Visit (INDEPENDENT_AMBULATORY_CARE_PROVIDER_SITE_OTHER): Payer: Medicare PPO | Admitting: Ophthalmology

## 2021-01-18 DIAGNOSIS — H35722 Serous detachment of retinal pigment epithelium, left eye: Secondary | ICD-10-CM | POA: Diagnosis not present

## 2021-01-18 DIAGNOSIS — H353211 Exudative age-related macular degeneration, right eye, with active choroidal neovascularization: Secondary | ICD-10-CM

## 2021-01-18 DIAGNOSIS — H353222 Exudative age-related macular degeneration, left eye, with inactive choroidal neovascularization: Secondary | ICD-10-CM

## 2021-01-18 MED ORDER — BEVACIZUMAB 2.5 MG/0.1ML IZ SOSY
2.5000 mg | PREFILLED_SYRINGE | INTRAVITREAL | Status: AC | PRN
  Administered 2021-01-18: 2.5 mg via INTRAVITREAL

## 2021-01-18 NOTE — Assessment & Plan Note (Addendum)
Stable over time interval of  years, not active disease stable acuity, will observe as component of an active CNVM in the past

## 2021-01-18 NOTE — Assessment & Plan Note (Signed)
Active disease with increasing CME and intraretinal fluid, currently at 91-month follow-up OD, will repeat injection today and shorten interval follow-up to 6 weeks OD

## 2021-01-18 NOTE — Progress Notes (Signed)
01/18/2021     CHIEF COMPLAINT Patient presents for Retina Follow Up (3 Month F/U OU, poss Avastin OD//Pt denies noticeable changes to [facility] OU since last visit. Pt denies ocular pain, flashes of light, or floaters OU. //)   HISTORY OF PRESENT ILLNESS: Candice Willis is a 87 y.o. female who presents to the clinic today for:   HPI    Retina Follow Up    Patient presents with  Wet AMD.  In right eye.  This started 3 months ago.  Severity is mild.  Duration of 3 months.  Since onset it is stable. Additional comments: 3 Month F/U OU, poss Avastin OD  Pt denies noticeable changes to [facility] OU since last visit. Pt denies ocular pain, flashes of light, or floaters OU.          Last edited by Ileana RoupKennerly, Paige, COA on 01/18/2021 10:17 AM. (History)      Referring physician: Mast, Man X, NP [address] [place] [id number]  HISTORICAL INFORMATION:   Selected notes from the MEDICAL RECORD NUMBER    Lab Results  Component Value Date   HGBA1C 5.4 03/14/2018     CURRENT MEDICATIONS: No current outpatient medications on file. (Ophthalmic Drugs)   No current facility-administered medications for this visit. (Ophthalmic Drugs)   Current Outpatient Medications (Other)  Medication Sig  . aspirin EC 81 MG tablet Take 81 mg by mouth daily. Swallow whole.  Marland Kitchen. atorvastatin (LIPITOR) 10 MG tablet Take 10 mg by mouth daily.  . Biotin 10 MG TABS Take by mouth daily.  . calcium carbonate (OSCAL) 1500 (600 Ca) MG TABS tablet Take 600 mg of elemental calcium by mouth 2 (two) times daily with a meal.  . Cholecalciferol (VITAMIN D3) 2000 units TABS Take 2,000 Units by mouth daily.  Marland Kitchen. diltiazem (CARDIZEM CD) 180 MG 24 hr capsule Take 1 capsule (180 mg total) by mouth daily.  Marland Kitchen. docusate sodium (COLACE) 100 MG capsule Take 100 mg by mouth 2 (two) times daily.   Marland Kitchen. EPINEPHrine 0.3 mg/0.3 mL IJ SOAJ injection Inject 0.3 mLs (0.3 mg total) into the muscle once as needed (anaphylaxis/allergic  reaction).  . lactose free nutrition (BOOST) LIQD Take 237 mLs by mouth. Once a day at breakfast  . meclizine (ANTIVERT) 12.5 MG tablet Take 12.5 mg by mouth daily as needed for dizziness.   . Melatonin 3 MG TABS Take 3 mg by mouth at bedtime as needed (sleep).  . memantine (NAMENDA) 10 MG tablet Take 10 mg by mouth 2 (two) times daily.   . Multiple Vitamins-Minerals (PRESERVISION AREDS) TABS Take 2 tablets by mouth daily.  . Multiple Vitamins-Minerals (THEREMS-M) TABS Take 1 tablet by mouth daily.  . polyethylene glycol (MIRALAX / GLYCOLAX) packet Take 17 g by mouth every other day.   No current facility-administered medications for this visit. (Other)      REVIEW OF SYSTEMS:    ALLERGIES No Known Allergies  PAST MEDICAL HISTORY Past Medical History:  Diagnosis Date  . Arteriosclerotic cardiovascular disease   . Bee sting reaction 08/23/2016   Tongue swelling and difficulty swallowing /notes 08/23/2016  . Constipation   . Degenerative joint disease of right hip 10/15/14  . Fracture of multiple pubic rami (HCC) 10/15/14   Right inferior and superior pubic rami  . HOH (hard of hearing)    Wears bilateral hearing aids  . Hyperlipidemia   . Hypertension   . Macular degeneration   . Memory deficit   .  Osteoporosis   . Pain in right foot   . Palpitations   . Paroxysmal atrial fibrillation (HCC)   . Thyroid nodule    Status post surgery  . Ulcer of hard palate   . Vertigo    Past Surgical History:  Procedure Laterality Date  . ABDOMINAL HYSTERECTOMY  1968  . APPENDECTOMY  1940's  . COLONOSCOPY    . FOOT SURGERY Left   . HARDWARE REMOVAL Right 02/16/2016   Procedure: HARDWARE REMOVAL RT HIP;  Surgeon: Sheral Apley, MD;  [facility] OR;  Service: Orthopedics;  Laterality: Right;  . HIP ARTHROPLASTY Right 02/16/2016   Procedure: ARTHROPLASTY BIPOLAR HIP (HEMIARTHROPLASTY);  Surgeon: Sheral Apley, MD;  Location: [facility] OR;  Service: Orthopedics;  Laterality: Right;  .  INTRAMEDULLARY (IM) NAIL INTERTROCHANTERIC Right 09/11/2015   Procedure: INTRAMEDULLARY (IM) NAIL INTERTROCHANTRIC;  Surgeon: Sheral Apley, MD;  Location: [facility] OR;  Service: Orthopedics;  Laterality: Right;  . KNEE SURGERY      FAMILY HISTORY Family History  Problem Relation Age of Onset  . Heart disease Father   . Cancer Father        prostate  . Cancer Maternal Grandmother        colon  . Heart disease Paternal Grandmother   . Hyperlipidemia Sister   . Cancer Sister        breast  . Heart disease Brother   . Hyperlipidemia Brother   . Cancer Brother        prostate    SOCIAL HISTORY Social History   Tobacco Use  . Smoking status: Former Smoker    Packs/day: 0.50    Types: Cigarettes    Quit date: 10/05/1971    Years since quitting: 49.3  . Smokeless tobacco: Never Used  Vaping Use  . Vaping Use: Never used  Substance Use Topics  . Alcohol use: Yes    Comment: 1-4 per week; eats 10 raisins daily soaked in Gin  . Drug use: No         OPHTHALMIC EXAM:  Base Eye Exam    Visual Acuity (ETDRS)      Right Left   Dist cc 20/100 -1 20/50 +2   Dist ph cc 20/80ecc -2 NI   Correction: Glasses       Tonometry (Tonopen, 10:18 AM)      Right Left   Pressure 09 12       Pupils      Shape APD   Right Round None   Left Round None       Visual Fields (Counting fingers)      Left Right    Full Full       Extraocular Movement      Right Left    Full Full       Neuro/Psych    Oriented x3: Yes   Mood/Affect: Normal       Dilation    Both eyes: 1.0% Mydriacyl, 2.5% Phenylephrine @ 10:23 AM        Slit Lamp and Fundus Exam    External Exam      Right Left   External Normal Normal       Slit Lamp Exam      Right Left   Lids/Lashes Normal Normal   Conjunctiva/Sclera White and quiet White and quiet   Cornea Clear Clear   Anterior Chamber Deep and quiet Deep and quiet   Iris Round and reactive Round and reactive   Lens Centered  posterior  chamber intraocular lens Centered posterior chamber intraocular lens   Anterior Vitreous Normal Normal       Fundus Exam      Right Left   Posterior Vitreous Central vitreous floaters Central vitreous floaters   Disc Normal Normal   C/D Ratio 0.1 0.1   Macula Advanced age related macular degeneration, Drusen, no exudates, Macular thickening, Subretinal neovascular membrane, Geographic atrophy Advanced age related macular degeneration, Disciform scar, Geographic atrophy parafoveal   Vessels Normal Normal   Periphery Normal Normal          IMAGING AND PROCEDURES  Imaging and Procedures for 01/18/21  OCT, Retina - OU - Both Eyes       Right Eye Quality was good. Scan locations included subfoveal. Central Foveal Thickness: 495. Progression has worsened. Findings include retinal drusen , choroidal neovascular membrane, cystoid macular edema, abnormal foveal contour, outer retinal atrophy, central retinal atrophy.   Left Eye Scan locations included subfoveal. Central Foveal Thickness: 324. Progression has been stable. Findings include abnormal foveal contour, subretinal fluid, pigment epithelial detachment.   Notes OD with chronically active CN VM, CME are stable centrally  at the 12-week interval today repeat intravitreal Avastin today.  OD, will need to shorten interval follow-up due to disease activity progression OS with chronic pigment epithelial detachment serous sub-RPE detachment no change for years,  observe       Intravitreal Injection, Pharmacologic Agent - OD - Right Eye       Time Out 01/18/2021. 10:46 AM. Confirmed correct patient, procedure, site, and patient consented.   Anesthesia Topical anesthesia was used. Anesthetic medications included Akten 3.5%.   Procedure Preparation included Tobramycin 0.3%, 10% betadine to eyelids, 5% betadine to ocular surface, Ofloxacin . A supplied needle was used.   Injection:  2.5 mg Bevacizumab (AVASTIN) 2.5mg /0.84mL  SOSY   NDC: 16109-604-54, Lot: [id number]   Route: Intravitreal, Site: Right Eye  Post-op Post injection exam found visual acuity of at least counting fingers. The patient tolerated the procedure well. There were no complications. The patient received written and verbal post procedure care education. Post injection medications were not given.                 ASSESSMENT/PLAN:  Exudative age-related macular degeneration of right eye with active choroidal neovascularization (HCC) Active disease with increasing CME and intraretinal fluid, currently at 74-month follow-up OD, will repeat injection today and shorten interval follow-up to 6 weeks OD  Macular pigment epithelial detachment, left Stable over time interval of  years, not active disease stable acuity, will observe as component of an active CNVM in the past  Exudative age-related macular degeneration of left eye with inactive choroidal neovascularization (HCC) Stable macular findings over years, with good acuity will observe      ICD-10-CM   1. Exudative age-related macular degeneration of right eye with active choroidal neovascularization (HCC)  H35.3211 OCT, Retina - OU - Both Eyes    Intravitreal Injection, Pharmacologic Agent - OD - Right Eye    bevacizumab (AVASTIN) SOSY 2.5 mg  2. Macular pigment epithelial detachment, left  H35.722   3. Exudative age-related macular degeneration of left eye with inactive choroidal neovascularization (HCC)  H35.3222     1.  Chronic active CNVM OD, attempt to extend interval follow-up to 3 months as allowed worsening of disease and increased thickness of the retina in the macular region as well as increased subretinal fluid.  We will repeat injection Avastin today shorten interval follow-up right  eye to a 6-week interval  2.  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  Ophthalmic Meds Ordered this visit:  Meds ordered this encounter  Medications  . bevacizumab (AVASTIN) SOSY 2.5 mg       Return in about 6 weeks (around 03/01/2021) for dilate, OD, AVASTIN OCT.  There are no Patient Instructions on file for this visit.   Explained the diagnoses, plan, and follow up with the patient and they expressed understanding.  Patient expressed understanding of the importance of proper follow up care.   Alford Highland Aijalon Kirtz M.D. Diseases & Surgery of the Retina and Vitreous [facility] 01/18/21     Abbreviations: M myopia (nearsighted); A astigmatism; H hyperopia (farsighted); P presbyopia; Mrx spectacle prescription;  CTL contact lenses; OD right eye; OS left eye; OU both eyes  XT exotropia; ET esotropia; PEK punctate epithelial keratitis; PEE punctate epithelial erosions; DES dry eye syndrome; MGD meibomian gland dysfunction; ATs artificial tears; PFAT's preservative free artificial tears; NSC nuclear sclerotic cataract; PSC posterior subcapsular cataract; ERM epi-retinal membrane; PVD posterior vitreous detachment; RD retinal detachment; DM diabetes mellitus; DR diabetic retinopathy; NPDR non-proliferative diabetic retinopathy; PDR proliferative diabetic retinopathy; CSME clinically significant macular edema; DME diabetic macular edema; dbh dot blot hemorrhages; CWS cotton wool spot; POAG primary open angle glaucoma; C/D cup-to-disc ratio; HVF humphrey visual field; GVF goldmann visual field; OCT optical coherence tomography; IOP intraocular pressure; BRVO Branch retinal vein occlusion; CRVO central retinal vein occlusion; CRAO central retinal artery occlusion; BRAO branch retinal artery occlusion; RT retinal tear; SB scleral buckle; PPV pars plana vitrectomy; VH Vitreous hemorrhage; PRP panretinal laser photocoagulation; IVK intravitreal kenalog; VMT vitreomacular traction; MH Macular hole;  NVD neovascularization of the disc; NVE  neovascularization elsewhere; AREDS age related eye disease study; ARMD age related macular degeneration; POAG primary open angle glaucoma; EBMD epithelial/anterior basement membrane dystrophy; ACIOL anterior chamber intraocular lens; IOL intraocular lens; PCIOL posterior chamber intraocular lens; Phaco/IOL phacoemulsification with intraocular lens placement; PRK photorefractive keratectomy; LASIK laser assisted in situ keratomileusis; HTN hypertension; DM diabetes mellitus; COPD chronic obstructive pulmonary disease

## 2021-01-18 NOTE — Assessment & Plan Note (Signed)
Stable macular findings over years, with good acuity will observe

## 2021-01-27 ENCOUNTER — Encounter: Payer: Self-pay | Admitting: Nurse Practitioner

## 2021-01-27 ENCOUNTER — Non-Acute Institutional Stay (SKILLED_NURSING_FACILITY): Payer: Medicare PPO | Admitting: Nurse Practitioner

## 2021-01-27 DIAGNOSIS — K5909 Other constipation: Secondary | ICD-10-CM

## 2021-01-27 DIAGNOSIS — I10 Essential (primary) hypertension: Secondary | ICD-10-CM | POA: Diagnosis not present

## 2021-01-27 DIAGNOSIS — E871 Hypo-osmolality and hyponatremia: Secondary | ICD-10-CM

## 2021-01-27 DIAGNOSIS — I480 Paroxysmal atrial fibrillation: Secondary | ICD-10-CM

## 2021-01-27 DIAGNOSIS — F0150 Vascular dementia without behavioral disturbance: Secondary | ICD-10-CM | POA: Diagnosis not present

## 2021-01-27 DIAGNOSIS — E782 Mixed hyperlipidemia: Secondary | ICD-10-CM

## 2021-01-27 DIAGNOSIS — M810 Age-related osteoporosis without current pathological fracture: Secondary | ICD-10-CM

## 2021-01-27 DIAGNOSIS — H8110 Benign paroxysmal vertigo, unspecified ear: Secondary | ICD-10-CM | POA: Diagnosis not present

## 2021-01-27 NOTE — Progress Notes (Signed)
Location:   [facility] Room Number: 25 Place of Service:  SNF (31) Provider:  Chipper Oman NP  Danity Schmelzer X, NP  Patient Care Team: Melisse Caetano X, NP as PCP - General (Internal Medicine) Nahser, Deloris Ping, MD as PCP - Cardiology (Cardiology) Venita Lick, MD as Consulting Physician (Orthopedic Surgery) Marykay Lex, MD as Consulting Physician (Cardiology) Issabelle Mcraney X, NP as Nurse Practitioner (Internal Medicine)  Extended Emergency Contact Information Primary Emergency Contact: [name],Talulah Address: [address]           [place], [place] [address] [place] Home Phone: [phone] Mobile Phone: [phone] Relation: Niece Secondary Emergency Contact: [name],[name]  [place] Mobile Phone: [phone] Relation: Niece  Code Status:  DNR Goals of care: Advanced Directive information Advanced Directives 06/03/2020  Does Patient Have a Medical Advance Directive? Yes  Type of Advance Directive Out of facility DNR (pink MOST or yellow form)  Does patient want to make changes to medical advance directive? No - Patient declined  Copy of Healthcare Power of Attorney in Chart? -  Would patient like information on creating a medical advance directive? No - Patient declined  Pre-existing out of facility DNR order (yellow form or pink MOST form) Pink MOST/Yellow Form most recent copy in chart - Physician notified to receive inpatient order     Chief Complaint  Patient presents with  . Medical Management of Chronic Issues    HPI:  Pt is a 87 y.o. female seen today for medical management of chronic diseases.                AFib, heart rate is in control, takes Diltiazem, ASA, Hgb 12.9 11/24/20 [facility], care assistance, on Memantine 10mg  bid for memory.  HTN, blood pressure is controlled onDiltiazem, ASA 81mg  qd.Bun/creat 21/0.7 11/24/20 Hyperlipidemia, takes  Atorvastatin 10mg  qd. LDL 88 01/07/21 OP, takes Ca, Vit D, has been Reclast before.  Constipation, takes Colace bid, MiraLax qod. Hyponatremia,Na 135 11/24/20             BPPV, takes prn Meclizine.    Past Medical History:  Diagnosis Date  . Arteriosclerotic cardiovascular disease   . Bee sting reaction 08/23/2016   Tongue swelling and difficulty swallowing /notes 08/23/2016  . Constipation   . Degenerative joint disease of right hip 10/15/14  . Fracture of multiple pubic rami (HCC) 10/15/14   Right inferior and superior pubic rami  . HOH (hard of hearing)    Wears bilateral hearing aids  . Hyperlipidemia   . Hypertension   . Macular degeneration   . Memory deficit   . Osteoporosis   . Pain in right foot   . Palpitations   . Paroxysmal atrial fibrillation (HCC)   . Thyroid nodule    Status post surgery  . Ulcer of hard palate   . Vertigo    Past Surgical History:  Procedure Laterality Date  . ABDOMINAL HYSTERECTOMY  1968  . APPENDECTOMY  1940's  . COLONOSCOPY    . FOOT SURGERY Left   . HARDWARE REMOVAL Right 02/16/2016   Procedure: HARDWARE REMOVAL RT HIP;  Surgeon: 10/17/14, MD;  Location: [facility] OR;  Service: Orthopedics;  Laterality: Right;  . HIP ARTHROPLASTY Right 02/16/2016   Procedure: ARTHROPLASTY BIPOLAR HIP (HEMIARTHROPLASTY);  Surgeon: Sheral Apley, MD;  Location: [facility] OR;  Service: Orthopedics;  Laterality: Right;  . INTRAMEDULLARY (IM) NAIL INTERTROCHANTERIC Right 09/11/2015   Procedure: INTRAMEDULLARY (  IM) NAIL INTERTROCHANTRIC;  Surgeon: Sheral Apley, MD;  Location: [facility] OR;  Service: Orthopedics;  Laterality: Right;  . KNEE SURGERY      No Known Allergies  Allergies as of 01/27/2021   No Known Allergies     Medication List       Accurate as of January 27, 2021 11:59 PM. If you have any questions, ask your nurse or doctor.        aspirin EC 81 MG tablet Take 81 mg by mouth daily. Swallow whole.    atorvastatin 10 MG tablet Commonly known as: LIPITOR Take 10 mg by mouth daily.   Biotin 10 MG Tabs Take by mouth daily.   calcium carbonate 1500 (600 Ca) MG Tabs tablet Commonly known as: OSCAL Take 600 mg of elemental calcium by mouth 2 (two) times daily with a meal.   diltiazem 180 MG 24 hr capsule Commonly known as: CARDIZEM CD Take 1 capsule (180 mg total) by mouth daily.   docusate sodium 100 MG capsule Commonly known as: COLACE Take 100 mg by mouth 2 (two) times daily.   EPINEPHrine 0.3 mg/0.3 mL Soaj injection Commonly known as: EPI-PEN Inject 0.3 mLs (0.3 mg total) into the muscle once as needed (anaphylaxis/allergic reaction).   lactose free nutrition Liqd Take 237 mLs by mouth. Once a day at breakfast   meclizine 12.5 MG tablet Commonly known as: ANTIVERT Take 12.5 mg by mouth daily as needed for dizziness.   melatonin 3 MG Tabs tablet Take 3 mg by mouth at bedtime as needed (sleep).   memantine 10 MG tablet Commonly known as: NAMENDA Take 10 mg by mouth 2 (two) times daily.   polyethylene glycol 17 g packet Commonly known as: MIRALAX / GLYCOLAX Take 17 g by mouth every other day.   Therems-M Tabs Take 1 tablet by mouth daily.   PreserVision AREDS Tabs Take 2 tablets by mouth daily.   Vitamin D3 50 MCG (2000 UT) Tabs Take 2,000 Units by mouth daily.       Review of Systems  Constitutional: Negative for fatigue, fever and unexpected weight change.  HENT: Positive for hearing loss. Negative for congestion and voice change.        Hears better left ear  Respiratory: Negative for cough and shortness of breath.   Cardiovascular: Negative for leg swelling.  Gastrointestinal: Negative for abdominal pain and constipation.  Genitourinary: Negative for dysuria and urgency.  Musculoskeletal: Positive for gait problem.  Skin: Negative for color change.  Neurological: Negative for speech difficulty, weakness, light-headedness and headaches.       Memory  lapses. Chronic on and off dizziness  Psychiatric/Behavioral: Positive for confusion. Negative for behavioral problems and sleep disturbance. The patient is not nervous/anxious.     Immunization History  Administered Date(s) Administered  . Influenza Whole 08/25/2018  . Influenza, High Dose Seasonal PF 08/24/2019  . Influenza-Unspecified 09/07/2016, 09/12/2017, 09/02/2020  . Moderna Sars-Covid-2 Vaccination 11/23/2019, 12/21/2019, 09/29/2020  . PPD Test 02/18/2016  . Pneumococcal Conjugate-13 09/18/2017  . Pneumococcal-Unspecified 10/22/2018  . Tdap 10/07/2012   Pertinent  Health Maintenance Due  Topic Date Due  . INFLUENZA VACCINE  Completed  . DEXA SCAN  Completed  . PNA vac Low Risk Adult  Completed   Fall Risk  08/17/2018 08/03/2017 10/24/2014  Falls in the past year? No No Yes  Number falls in past yr: - - 1  Injury with Fall? - - Yes  Risk Factor Category  - - High Fall Risk  Risk for fall due to : - - History of fall(s)   Functional Status Survey:    Vitals:   01/27/21 1527  BP: (!) 146/76  Pulse: 72  Resp: 18  Temp: (!) 97.4 F (36.3 C)  SpO2: 97%  Weight: 122 lb 8 oz (55.6 kg)  Height: 5\' 5"  (1.651 m)   Body mass index is 20.39 kg/m. Physical Exam Vitals and nursing note reviewed.  Constitutional:      Appearance: Normal appearance. She is normal weight.  HENT:     Head: Normocephalic and atraumatic.     Mouth/Throat:     Mouth: Mucous membranes are moist.  Eyes:     Extraocular Movements: Extraocular movements intact.     Conjunctiva/sclera: Conjunctivae normal.     Pupils: Pupils are equal, round, and reactive to light.  Cardiovascular:     Rate and Rhythm: Normal rate and regular rhythm.     Heart sounds: No murmur heard.   Pulmonary:     Effort: Pulmonary effort is normal.     Breath sounds: No rales.  Abdominal:     General: Bowel sounds are normal.     Palpations: Abdomen is soft.     Tenderness: There is no abdominal tenderness.   Musculoskeletal:     Cervical back: Normal range of motion and neck supple.     Right lower leg: No edema.     Left lower leg: No edema.  Skin:    General: Skin is warm and dry.  Neurological:     General: No focal deficit present.     Mental Status: She is alert. Mental status is at baseline.     Gait: Gait abnormal.     Comments: Oriented to person, place.   Psychiatric:        Mood and Affect: Mood normal.        Behavior: Behavior normal.        Thought Content: Thought content normal.     Labs reviewed: Recent Labs    06/03/20 0952 09/11/20 0000 11/24/20 0000  NA 131* 135* 135*  K 3.7 4.3 4.2  CL 99 102 103  CO2 21* 26* 24*  GLUCOSE 107*  --   --   BUN 13 15 21   CREATININE 0.59 0.8 0.7  CALCIUM 9.8 10.0 9.5   Recent Labs    06/03/20 0952 09/11/20 0000 11/24/20 0000  AST 26 19 19   ALT 19 14 14   ALKPHOS 51 52 57  BILITOT 0.9  --   --   PROT 7.1  --   --   ALBUMIN 4.1 3.9 3.9   Recent Labs    06/03/20 1022 09/11/20 0000 11/24/20 0000  WBC 6.5 6.2 6.4  NEUTROABS 5.3 4,365.00 4,518.00  HGB 13.2 12.8 12.9  HCT 39.2 37 38  MCV 94.2  --   --   PLT 266 281 292   Lab Results  Component Value Date   TSH 2.703 06/03/2020   Lab Results  Component Value Date   HGBA1C 5.4 03/14/2018   Lab Results  Component Value Date   CHOL 178 01/07/2021   HDL 65 01/07/2021   LDLCALC 88 01/07/2021   TRIG 145 01/07/2021   CHOLHDL 2.6 03/14/2018    Significant Diagnostic Results in last 30 days:  Intravitreal Injection, Pharmacologic Agent - OD - Right Eye  Result Date: 01/18/2021 Time Out 01/18/2021. 10:46 AM. Confirmed correct patient, procedure, site, and patient consented. Anesthesia Topical anesthesia was used. Anesthetic medications included  Akten 3.5%. Procedure Preparation included Tobramycin 0.3%, 10% betadine to eyelids, 5% betadine to ocular surface, Ofloxacin . A supplied needle was used. Injection: 2.5 mg Bevacizumab (AVASTIN) 2.5mg /0.641mL SOSY   NDC:  13244-010-2771449-091-43, Lot: [id number]   Route: Intravitreal, Site: Right Eye Post-op Post injection exam found visual acuity of at least counting fingers. The patient tolerated the procedure well. There were no complications. The patient received written and verbal post procedure care education. Post injection medications were not given.   OCT, Retina - OU - Both Eyes  Result Date: 01/18/2021 Right Eye Quality was good. Scan locations included subfoveal. Central Foveal Thickness: 495. Progression has worsened. Findings include retinal drusen , choroidal neovascular membrane, cystoid macular edema, abnormal foveal contour, outer retinal atrophy, central retinal atrophy. Left Eye Scan locations included subfoveal. Central Foveal Thickness: 324. Progression has been stable. Findings include abnormal foveal contour, subretinal fluid, pigment epithelial detachment. Notes OD with chronically active CN VM, CME are stable centrally  at the 12-week interval today repeat intravitreal Avastin today. OD, will need to shorten interval follow-up due to disease activity progression OS with chronic pigment epithelial detachment serous sub-RPE detachment no change for years,  observe   Assessment/Plan Hyperlipidemia takes Atorvastatin 10mg  qd. LDL 88 01/07/21   Osteoporosis without current pathological fracture takes Ca, Vit D, has been Reclast before.    Chronic constipation  takes Colace bid, MiraLax qod.   Hyponatremia Na 135 11/24/20  Benign paroxysmal positional vertigo takes prn Meclizine.   Essential hypertension blood pressure is controlled onDiltiazem, ASA 81mg  qd.Bun/creat 21/0.7 11/24/20   PAF (paroxysmal atrial fibrillation) (HCC) heart rate is in control, takes Diltiazem, ASA, Hgb 12.9 11/24/20  Vascular dementia [facility](HCC) [facility], care assistance, on Memantine 10mg  bid for memory.     Family/ staff Communication: plan of care reviewed with the patient and charge  nurse.   Labs/tests ordered:  none  Time spend 35 minutes.

## 2021-01-28 ENCOUNTER — Encounter: Payer: Self-pay | Admitting: Nurse Practitioner

## 2021-01-28 NOTE — Assessment & Plan Note (Signed)
takes Colace bid, MiraLax qod.

## 2021-01-28 NOTE — Assessment & Plan Note (Signed)
Na 135 11/24/20

## 2021-01-28 NOTE — Assessment & Plan Note (Signed)
[facility], care assistance, on Memantine 10mg  bid for memory.

## 2021-01-28 NOTE — Assessment & Plan Note (Signed)
takes Ca, Vit D, has been Reclast before.  °

## 2021-01-28 NOTE — Assessment & Plan Note (Signed)
takes Atorvastatin 10mg qd. LDL 88 01/07/21 °

## 2021-01-28 NOTE — Assessment & Plan Note (Signed)
heart rate is in control, takes Diltiazem, ASA, Hgb 12.9 11/24/20

## 2021-01-28 NOTE — Assessment & Plan Note (Signed)
takes prn Meclizine °

## 2021-01-28 NOTE — Assessment & Plan Note (Signed)
blood pressure is controlled onDiltiazem, ASA 81mg  qd.Bun/creat 21/0.7 11/24/20

## 2021-03-01 ENCOUNTER — Other Ambulatory Visit: Payer: Self-pay

## 2021-03-01 ENCOUNTER — Encounter (INDEPENDENT_AMBULATORY_CARE_PROVIDER_SITE_OTHER): Payer: Self-pay | Admitting: Ophthalmology

## 2021-03-01 ENCOUNTER — Ambulatory Visit (INDEPENDENT_AMBULATORY_CARE_PROVIDER_SITE_OTHER): Payer: Medicare PPO | Admitting: Ophthalmology

## 2021-03-01 DIAGNOSIS — H353222 Exudative age-related macular degeneration, left eye, with inactive choroidal neovascularization: Secondary | ICD-10-CM

## 2021-03-01 DIAGNOSIS — H43811 Vitreous degeneration, right eye: Secondary | ICD-10-CM

## 2021-03-01 DIAGNOSIS — H353211 Exudative age-related macular degeneration, right eye, with active choroidal neovascularization: Secondary | ICD-10-CM

## 2021-03-01 MED ORDER — BEVACIZUMAB 2.5 MG/0.1ML IZ SOSY
2.5000 mg | PREFILLED_SYRINGE | INTRAVITREAL | Status: AC | PRN
Start: 2021-03-01 — End: 2021-03-01
  Administered 2021-03-01: 2.5 mg via INTRAVITREAL

## 2021-03-01 NOTE — Progress Notes (Signed)
03/01/2021     CHIEF COMPLAINT Patient presents for Retina Follow Up (6 week f/u. Poss Avastin OD//Pt states VA OU stable since last visit. Pt denies FOL, floaters, or ocular pain OU.  )   HISTORY OF PRESENT ILLNESS: Candice Willis is a 87 y.o. female who presents to the clinic today for:   HPI    Retina Follow Up    Patient presents with  Wet AMD.  In right eye.  This started 6 weeks ago.  Severity is mild.  Duration of 6 weeks.  Since onset it is stable. Additional comments: 6 week f/u. Poss Avastin OD  Pt states VA OU stable since last visit. Pt denies FOL, floaters, or ocular pain OU.         Last edited by Demetrios Loll, COA on 03/01/2021  9:51 AM. (History)      Referring physician: Mast, Man X, NP [address] [place],  [place] [address]  HISTORICAL INFORMATION:   Selected notes from the MEDICAL RECORD NUMBER    Lab Results  Component Value Date   HGBA1C 5.4 03/14/2018     CURRENT MEDICATIONS: No current outpatient medications on file. (Ophthalmic Drugs)   No current facility-administered medications for this visit. (Ophthalmic Drugs)   Current Outpatient Medications (Other)  Medication Sig  . aspirin EC 81 MG tablet Take 81 mg by mouth daily. Swallow whole.  Marland Kitchen atorvastatin (LIPITOR) 10 MG tablet Take 10 mg by mouth daily.  . Biotin 10 MG TABS Take by mouth daily.  . calcium carbonate (OSCAL) 1500 (600 Ca) MG TABS tablet Take 600 mg of elemental calcium by mouth 2 (two) times daily with a meal.  . Cholecalciferol (VITAMIN D3) 2000 units TABS Take 2,000 Units by mouth daily.  Marland Kitchen diltiazem (CARDIZEM CD) 180 MG 24 hr capsule Take 1 capsule (180 mg total) by mouth daily.  Marland Kitchen docusate sodium (COLACE) 100 MG capsule Take 100 mg by mouth 2 (two) times daily.   Marland Kitchen EPINEPHrine 0.3 mg/0.3 mL IJ SOAJ injection Inject 0.3 mLs (0.3 mg total) into the muscle once as needed (anaphylaxis/allergic reaction).  . lactose free nutrition (BOOST) LIQD Take 237 mLs by mouth. Once  a day at breakfast  . meclizine (ANTIVERT) 12.5 MG tablet Take 12.5 mg by mouth daily as needed for dizziness.   . Melatonin 3 MG TABS Take 3 mg by mouth at bedtime as needed (sleep).  . memantine (NAMENDA) 10 MG tablet Take 10 mg by mouth 2 (two) times daily.   . Multiple Vitamins-Minerals (PRESERVISION AREDS) TABS Take 2 tablets by mouth daily.  . Multiple Vitamins-Minerals (THEREMS-M) TABS Take 1 tablet by mouth daily.  . polyethylene glycol (MIRALAX / GLYCOLAX) packet Take 17 g by mouth every other day.   No current facility-administered medications for this visit. (Other)      REVIEW OF SYSTEMS:    ALLERGIES No Known Allergies  PAST MEDICAL HISTORY Past Medical History:  Diagnosis Date  . Arteriosclerotic cardiovascular disease   . Bee sting reaction 08/23/2016   Tongue swelling and difficulty swallowing /notes 08/23/2016  . Constipation   . Degenerative joint disease of right hip 10/15/14  . Fracture of multiple pubic rami (HCC) 10/15/14   Right inferior and superior pubic rami  . HOH (hard of hearing)    Wears bilateral hearing aids  . Hyperlipidemia   . Hypertension   . Macular degeneration   . Memory deficit   . Osteoporosis   . Pain in  right foot   . Palpitations   . Paroxysmal atrial fibrillation (HCC)   . Thyroid nodule    Status post surgery  . Ulcer of hard palate   . Vertigo    Past Surgical History:  Procedure Laterality Date  . ABDOMINAL HYSTERECTOMY  1968  . APPENDECTOMY  1940's  . COLONOSCOPY    . FOOT SURGERY Left   . HARDWARE REMOVAL Right 02/16/2016   Procedure: HARDWARE REMOVAL RT HIP;  Surgeon: Sheral Apley, MD;  Location: [facility] OR;  Service: Orthopedics;  Laterality: Right;  . HIP ARTHROPLASTY Right 02/16/2016   Procedure: ARTHROPLASTY BIPOLAR HIP (HEMIARTHROPLASTY);  Surgeon: Sheral Apley, MD;  Location: [facility] OR;  Service: Orthopedics;  Laterality: Right;  . INTRAMEDULLARY (IM) NAIL INTERTROCHANTERIC Right 09/11/2015   Procedure:  INTRAMEDULLARY (IM) NAIL INTERTROCHANTRIC;  Surgeon: Sheral Apley, MD;  Location: [facility] OR;  Service: Orthopedics;  Laterality: Right;  . KNEE SURGERY      FAMILY HISTORY Family History  Problem Relation Age of Onset  . Heart disease Father   . Cancer Father        prostate  . Cancer Maternal Grandmother        colon  . Heart disease Paternal Grandmother   . Hyperlipidemia Sister   . Cancer Sister        breast  . Heart disease Brother   . Hyperlipidemia Brother   . Cancer Brother        prostate    SOCIAL HISTORY Social History   Tobacco Use  . Smoking status: Former Smoker    Packs/day: 0.50    Types: Cigarettes    Quit date: 10/05/1971    Years since quitting: 49.4  . Smokeless tobacco: Never Used  Vaping Use  . Vaping Use: Never used  Substance Use Topics  . Alcohol use: Yes    Comment: 1-4 per week; eats 10 raisins daily soaked in Gin  . Drug use: No         OPHTHALMIC EXAM:  Base Eye Exam    Visual Acuity (ETDRS)      Right Left   Dist cc 20/100 -2 20/50 +1   Dist ph cc NI NI   Correction: Glasses       Tonometry (Tonopen, 9:57 AM)      Right Left   Pressure 10 11       Pupils      Pupils Dark Light Shape React APD   Right PERRL 3 3 Round Sluggish None   Left PERRL 2 2 Round Sluggish None       Visual Fields (Counting fingers)      Left Right    Full Full       Extraocular Movement      Right Left    Full Full       Neuro/Psych    Oriented x3: Yes   Mood/Affect: Normal       Dilation    Right eye: 1.0% Mydriacyl, 2.5% Phenylephrine @ 9:57 AM        Slit Lamp and Fundus Exam    External Exam      Right Left   External Normal Normal       Slit Lamp Exam      Right Left   Lids/Lashes Normal Normal   Conjunctiva/Sclera White and quiet White and quiet   Cornea Clear Clear   Anterior Chamber Deep and quiet Deep and quiet   Iris Round and reactive Round and  reactive   Lens Centered posterior chamber intraocular lens  Centered posterior chamber intraocular lens   Anterior Vitreous Normal Normal       Fundus Exam      Right Left   Posterior Vitreous Central vitreous floaters, Posterior vitreous detachment    Disc Normal    C/D Ratio 0.1    Macula Advanced age related macular degeneration, Drusen, no exudates, Macular thickening, Subretinal neovascular membrane, Geographic atrophy, Cystoid macular edema    Vessels Normal    Periphery Normal           IMAGING AND PROCEDURES  Imaging and Procedures for 03/01/21  OCT, Retina - OU - Both Eyes       Right Eye Quality was good. Scan locations included subfoveal. Central Foveal Thickness: 543. Progression has worsened. Findings include retinal drusen , choroidal neovascular membrane, cystoid macular edema, abnormal foveal contour, outer retinal atrophy, central retinal atrophy.   Left Eye Quality was good. Scan locations included subfoveal. Central Foveal Thickness: 321. Progression has been stable. Findings include abnormal foveal contour, subretinal fluid, pigment epithelial detachment.   Notes OD with chronically active CN VM, CME are stable centrally  at the 7-week interval today repeat intravitreal Avastin today.  OD stabilized at 7-week follow-up interval, will repeat again examination in 7 to 8 weeks OS with chronic pigment epithelial detachment serous sub-RPE detachment no change for years,  observe       Intravitreal Injection, Pharmacologic Agent - OD - Right Eye       Time Out 03/01/2021. 10:29 AM. Confirmed correct patient, procedure, site, and patient consented.   Anesthesia Topical anesthesia was used. Anesthetic medications included Akten 3.5%.   Procedure Preparation included Tobramycin 0.3%, 10% betadine to eyelids, 5% betadine to ocular surface, Ofloxacin . A supplied needle was used.   Injection:  2.5 mg Bevacizumab (AVASTIN) 2.5mg /0.65mL SOSY   NDC: 13086-578-46   Route: Intravitreal, Site: Right Eye  Post-op Post  injection exam found visual acuity of at least counting fingers. The patient tolerated the procedure well. There were no complications. The patient received written and verbal post procedure care education. Post injection medications were not given.                 ASSESSMENT/PLAN:  Exudative age-related macular degeneration of left eye with inactive choroidal neovascularization (HCC) With chronic small pigment epithelial detachment now present for years on no therapy, will continue to monitor by observation OS  Exudative age-related macular degeneration of right eye with active choroidal neovascularization (HCC) Chronic active CME subfoveal secondary to wet ARMD.  Stable at 6-week interval follow-up.  We will again keep the goal of prevention of scotoma growth by treatment every 6 to 8 weeks right eye  Posterior vitreous detachment, right eye   The nature of posterior vitreous detachment was discussed with the patient as well as its physiology, its age prevalence, and its possible implication regarding retinal breaks and detachment.  An informational brochure was given to the patient.  All the patient's questions were answered.  The patient was asked to return if new or different flashes or floaters develops.   Patient was instructed to contact office immediately if any changes were noticed. I explained to the patient that vitreous inside the eye is similar to jello inside a bowl. As the jello melts it can start to pull away from the bowl, similarly the vitreous throughout our lives can begin to pull away from the retina. That process is called a posterior  vitreous detachment. In some cases, the vitreous can tug hard enough on the retina to form a retinal tear. I discussed with the patient the signs and symptoms of a retinal detachment.  Do not rub the eye.      ICD-10-CM   1. Exudative age-related macular degeneration of right eye with active choroidal neovascularization (HCC)  H35.3211  OCT, Retina - OU - Both Eyes    Intravitreal Injection, Pharmacologic Agent - OD - Right Eye    bevacizumab (AVASTIN) SOSY 2.5 mg  2. Exudative age-related macular degeneration of left eye with inactive choroidal neovascularization (HCC)  H35.3222   3. Posterior vitreous detachment, right eye  H43.811     1.  We will repeat injection intravitreal Avastin OD to maintain and preserve preserve visual functioning and prevent scotoma  2.  Dilate OU next likely injection OD next  3.  Ophthalmic Meds Ordered this visit:  Meds ordered this encounter  Medications  . bevacizumab (AVASTIN) SOSY 2.5 mg       Return in about 8 weeks (around 04/26/2021) for DILATE OU, AVASTIN OCT, OD.  There are no Patient Instructions on file for this visit.   Explained the diagnoses, plan, and follow up with the patient and they expressed understanding.  Patient expressed understanding of the importance of proper follow up care.   Alford HighlandGary A. Yenny Kosa M.D. Diseases & Surgery of the Retina and Vitreous [facility] 03/01/21     Abbreviations: M myopia (nearsighted); A astigmatism; H hyperopia (farsighted); P presbyopia; Mrx spectacle prescription;  CTL contact lenses; OD right eye; OS left eye; OU both eyes  XT exotropia; ET esotropia; PEK punctate epithelial keratitis; PEE punctate epithelial erosions; DES dry eye syndrome; MGD meibomian gland dysfunction; ATs artificial tears; PFAT's preservative free artificial tears; NSC nuclear sclerotic cataract; PSC posterior subcapsular cataract; ERM epi-retinal membrane; PVD posterior vitreous detachment; RD retinal detachment; DM diabetes mellitus; DR diabetic retinopathy; NPDR non-proliferative diabetic retinopathy; PDR proliferative diabetic retinopathy; CSME clinically significant macular edema; DME diabetic macular edema; dbh dot blot hemorrhages; CWS cotton wool spot; POAG primary open angle glaucoma; C/D cup-to-disc ratio; HVF humphrey visual field;  GVF goldmann visual field; OCT optical coherence tomography; IOP intraocular pressure; BRVO Branch retinal vein occlusion; CRVO central retinal vein occlusion; CRAO central retinal artery occlusion; BRAO branch retinal artery occlusion; RT retinal tear; SB scleral buckle; PPV pars plana vitrectomy; VH Vitreous hemorrhage; PRP panretinal laser photocoagulation; IVK intravitreal kenalog; VMT vitreomacular traction; MH Macular hole;  NVD neovascularization of the disc; NVE neovascularization elsewhere; AREDS age related eye disease study; ARMD age related macular degeneration; POAG primary open angle glaucoma; EBMD epithelial/anterior basement membrane dystrophy; ACIOL anterior chamber intraocular lens; IOL intraocular lens; PCIOL posterior chamber intraocular lens; Phaco/IOL phacoemulsification with intraocular lens placement; PRK photorefractive keratectomy; LASIK laser assisted in situ keratomileusis; HTN hypertension; DM diabetes mellitus; COPD chronic obstructive pulmonary disease

## 2021-03-01 NOTE — Assessment & Plan Note (Signed)
Chronic active CME subfoveal secondary to wet ARMD.  Stable at 6-week interval follow-up.  We will again keep the goal of prevention of scotoma growth by treatment every 6 to 8 weeks right eye

## 2021-03-01 NOTE — Assessment & Plan Note (Signed)

## 2021-03-01 NOTE — Assessment & Plan Note (Signed)
With chronic small pigment epithelial detachment now present for years on no therapy, will continue to monitor by observation OS

## 2021-03-02 ENCOUNTER — Encounter: Payer: Self-pay | Admitting: Internal Medicine

## 2021-03-02 ENCOUNTER — Non-Acute Institutional Stay (SKILLED_NURSING_FACILITY): Payer: Medicare PPO | Admitting: Internal Medicine

## 2021-03-02 DIAGNOSIS — F0150 Vascular dementia without behavioral disturbance: Secondary | ICD-10-CM | POA: Diagnosis not present

## 2021-03-02 DIAGNOSIS — I10 Essential (primary) hypertension: Secondary | ICD-10-CM | POA: Diagnosis not present

## 2021-03-02 DIAGNOSIS — I480 Paroxysmal atrial fibrillation: Secondary | ICD-10-CM | POA: Diagnosis not present

## 2021-03-02 DIAGNOSIS — M810 Age-related osteoporosis without current pathological fracture: Secondary | ICD-10-CM

## 2021-03-02 DIAGNOSIS — E782 Mixed hyperlipidemia: Secondary | ICD-10-CM | POA: Diagnosis not present

## 2021-03-02 NOTE — Progress Notes (Signed)
Location:  [facility] Room Number: 25 Place of Service:  SNF (31)  Provider:   Code Status:  Goals of Care:  Advanced Directives 03/02/2021  Does Patient Have a Medical Advance Directive? Yes  Type of Advance Directive Out of facility DNR (pink MOST or yellow form);Living will  Does patient want to make changes to medical advance directive? No - Patient declined  Copy of Healthcare Power of Attorney in Chart? -  Would patient like information on creating a medical advance directive? -  Pre-existing out of facility DNR order (yellow form or pink MOST form) Pink MOST form placed in chart (order not valid for inpatient use)     Chief Complaint  Patient presents with  . Medical Management of Chronic Issues    HPI: Patient is a 87 y.o. female seen today for medical management of chronic diseases.    She has h/o Hypertension, Hyperlipidemia,  Cognitive Impairment,  BPPV, Vit D def, Hyponatremia,   Macular degeneration Went to ED in 7/21 for ? A Fib But Dr Melburn Popper thinks it was Accelerated Junctional Rhythm. Norvasc stopped and started on Cardizem. No Anticoagulation  Doing well. Walks with her walker all the time No Recent Falls No Acute issues  Weight is stable  Past Medical History:  Diagnosis Date  . Arteriosclerotic cardiovascular disease   . Bee sting reaction 08/23/2016   Tongue swelling and difficulty swallowing /notes 08/23/2016  . Constipation   . Degenerative joint disease of right hip 10/15/14  . Fracture of multiple pubic rami (HCC) 10/15/14   Right inferior and superior pubic rami  . HOH (hard of hearing)    Wears bilateral hearing aids  . Hyperlipidemia   . Hypertension   . Macular degeneration   . Memory deficit   . Osteoporosis   . Pain in right foot   . Palpitations   . Paroxysmal atrial fibrillation (HCC)   . Thyroid nodule    Status post surgery  . Ulcer of hard palate   . Vertigo     Past Surgical History:   Procedure Laterality Date  . ABDOMINAL HYSTERECTOMY  1968  . APPENDECTOMY  1940's  . COLONOSCOPY    . FOOT SURGERY Left   . HARDWARE REMOVAL Right 02/16/2016   Procedure: HARDWARE REMOVAL RT HIP;  Surgeon: Sheral Apley, MD;  Location: [facility] OR;  Service: Orthopedics;  Laterality: Right;  . HIP ARTHROPLASTY Right 02/16/2016   Procedure: ARTHROPLASTY BIPOLAR HIP (HEMIARTHROPLASTY);  Surgeon: Sheral Apley, MD;  Location: [facility] OR;  Service: Orthopedics;  Laterality: Right;  . INTRAMEDULLARY (IM) NAIL INTERTROCHANTERIC Right 09/11/2015   Procedure: INTRAMEDULLARY (IM) NAIL INTERTROCHANTRIC;  Surgeon: Sheral Apley, MD;  Location: [facility] OR;  Service: Orthopedics;  Laterality: Right;  . KNEE SURGERY      No Known Allergies  Outpatient Encounter Medications as of 03/02/2021  Medication Sig  . aspirin EC 81 MG tablet Take 81 mg by mouth daily. Swallow whole.  Marland Kitchen atorvastatin (LIPITOR) 10 MG tablet Take 10 mg by mouth daily.  . Biotin 10 MG TABS Take by mouth daily.  . calcium carbonate (OSCAL) 1500 (600 Ca) MG TABS tablet Take 600 mg of elemental calcium by mouth 2 (two) times daily with a meal.  . Cholecalciferol (VITAMIN D3) 2000 units TABS Take 2,000 Units by mouth daily.  Marland Kitchen diltiazem (CARDIZEM CD) 180 MG 24 hr capsule Take 1 capsule (180 mg total) by mouth daily.  Marland Kitchen docusate sodium (COLACE) 100 MG capsule  Take 100 mg by mouth 2 (two) times daily.   Marland Kitchen EPINEPHrine 0.3 mg/0.3 mL IJ SOAJ injection Inject 0.3 mLs (0.3 mg total) into the muscle once as needed (anaphylaxis/allergic reaction).  . lactose free nutrition (BOOST) LIQD Take 237 mLs by mouth. Once a day at breakfast  . meclizine (ANTIVERT) 12.5 MG tablet Take 12.5 mg by mouth daily as needed for dizziness.   . Melatonin 3 MG TABS Take 3 mg by mouth at bedtime as needed (sleep).  . memantine (NAMENDA) 10 MG tablet Take 10 mg by mouth 2 (two) times daily.   . Multiple Vitamins-Minerals (PRESERVISION AREDS) TABS Take 2 tablets by mouth  daily.  . Multiple Vitamins-Minerals (THEREMS-M) TABS Take 1 tablet by mouth daily.  . polyethylene glycol (MIRALAX / GLYCOLAX) packet Take 17 g by mouth every other day.   No facility-administered encounter medications on file as of 03/02/2021.    Review of Systems:  Review of Systems   Denies everything not sure due to dementia  Health Maintenance  Topic Date Due  . INFLUENZA VACCINE  06/21/2021  . TETANUS/TDAP  10/07/2022  . DEXA SCAN  Completed  . COVID-19 Vaccine  Completed  . PNA vac Low Risk Adult  Completed  . HPV VACCINES  Aged Out    Physical Exam: Vitals:   03/02/21 1454  BP: (!) 141/85  Pulse: 63  Resp: 18  Temp: (!) 97.5 F (36.4 C)  SpO2: 97%  Weight: 123 lb 8 oz (56 kg)  Height: 5\' 5"  (1.651 m)   Body mass index is 20.55 kg/m. Physical Exam  Constitutional:  Well-developed and well-nourished.  HENT:  Head: Normocephalic.  Mouth/Throat: Oropharynx is clear and moist.  Eyes: Pupils are equal, round, and reactive to light.  Neck: Neck supple.  Cardiovascular: Normal rate and normal heart sounds.  No murmur heard. Pulmonary/Chest: Effort normal and breath sounds normal. No respiratory distress. No wheezes. She has no rales.  Abdominal: Soft. Bowel sounds are normal. No distension. There is no tenderness. There is no rebound.  Musculoskeletal: No edema.  Lymphadenopathy: none Neurological: No Focal Deficits Walks well with her [place] Skin: Skin is warm and dry.  Psychiatric: Normal mood and affect. Behavior is normal. Thought content normal.    Labs reviewed: Basic Metabolic Panel: Recent Labs    06/03/20 0952 06/03/20 1022 09/11/20 0000 11/24/20 0000  NA 131*  --  135* 135*  K 3.7  --  4.3 4.2  CL 99  --  102 103  CO2 21*  --  26* 24*  GLUCOSE 107*  --   --   --   BUN 13  --  15 21  CREATININE 0.59  --  0.8 0.7  CALCIUM 9.8  --  10.0 9.5  TSH  --  2.703  --   --    Liver Function Tests: Recent Labs    06/03/20 0952 09/11/20 0000  11/24/20 0000  AST 26 19 19   ALT 19 14 14   ALKPHOS 51 52 57  BILITOT 0.9  --   --   PROT 7.1  --   --   ALBUMIN 4.1 3.9 3.9   No results for input(s): LIPASE, AMYLASE in the last 8760 hours. No results for input(s): AMMONIA in the last 8760 hours. CBC: Recent Labs    06/03/20 1022 09/11/20 0000 11/24/20 0000  WBC 6.5 6.2 6.4  NEUTROABS 5.3 4,365.00 4,518.00  HGB 13.2 12.8 12.9  HCT 39.2 37 38  MCV 94.2  --   --  PLT 266 281 292   Lipid Panel: Recent Labs    03/05/20 0000 01/07/21 0000  CHOL 183 178  HDL 70 65  LDLCALC 94 88  TRIG 97 145   Lab Results  Component Value Date   HGBA1C 5.4 03/14/2018    Procedures since last visit: Intravitreal Injection, Pharmacologic Agent - OD - Right Eye  Result Date: 03/01/2021 Time Out 03/01/2021. 10:29 AM. Confirmed correct patient, procedure, site, and patient consented. Anesthesia Topical anesthesia was used. Anesthetic medications included Akten 3.5%. Procedure Preparation included Tobramycin 0.3%, 10% betadine to eyelids, 5% betadine to ocular surface, Ofloxacin . A supplied needle was used. Injection: 2.5 mg Bevacizumab (AVASTIN) 2.5mg /0.39mL SOSY   NDC: 85929-244-62   Route: Intravitreal, Site: Right Eye Post-op Post injection exam found visual acuity of at least counting fingers. The patient tolerated the procedure well. There were no complications. The patient received written and verbal post procedure care education. Post injection medications were not given.   OCT, Retina - OU - Both Eyes  Result Date: 03/01/2021 Right Eye Quality was good. Scan locations included subfoveal. Central Foveal Thickness: 543. Progression has worsened. Findings include retinal drusen , choroidal neovascular membrane, cystoid macular edema, abnormal foveal contour, outer retinal atrophy, central retinal atrophy. Left Eye Quality was good. Scan locations included subfoveal. Central Foveal Thickness: 321. Progression has been stable. Findings include  abnormal foveal contour, subretinal fluid, pigment epithelial detachment. Notes OD with chronically active CN VM, CME are stable centrally  at the 7-week interval today repeat intravitreal Avastin today. OD stabilized at 7-week follow-up interval, will repeat again examination in 7 to 8 weeks OS with chronic pigment epithelial detachment serous sub-RPE detachment no change for years,  observe   Assessment/Plan Essential hypertension On Cardizem per cardiology also to help her A Fib Mixed hyperlipidemia On statin LDL 88 in 2/22 Age-related osteoporosis without current pathological fracture Has Received Reclast before Continue Calcium and Vit D PAF (paroxysmal atrial fibrillation) (HCC) On Cardizem per cardiology No Anticoagulation Vascular dementia without behavioral disturbance (HCC) On Namenda and now in SNF   Labs/tests ordered:  * No order type specified * Next appt:  Visit date not found

## 2021-03-17 ENCOUNTER — Encounter: Payer: Self-pay | Admitting: Nurse Practitioner

## 2021-03-17 ENCOUNTER — Non-Acute Institutional Stay (SKILLED_NURSING_FACILITY): Payer: Medicare PPO | Admitting: Nurse Practitioner

## 2021-03-17 DIAGNOSIS — I10 Essential (primary) hypertension: Secondary | ICD-10-CM

## 2021-03-17 DIAGNOSIS — I480 Paroxysmal atrial fibrillation: Secondary | ICD-10-CM | POA: Diagnosis not present

## 2021-03-17 DIAGNOSIS — F0391 Unspecified dementia with behavioral disturbance: Secondary | ICD-10-CM | POA: Diagnosis not present

## 2021-03-17 DIAGNOSIS — E871 Hypo-osmolality and hyponatremia: Secondary | ICD-10-CM | POA: Diagnosis not present

## 2021-03-17 DIAGNOSIS — F0394 Unspecified dementia, unspecified severity, with anxiety: Secondary | ICD-10-CM | POA: Insufficient documentation

## 2021-03-17 DIAGNOSIS — F0150 Vascular dementia without behavioral disturbance: Secondary | ICD-10-CM

## 2021-03-17 DIAGNOSIS — H8110 Benign paroxysmal vertigo, unspecified ear: Secondary | ICD-10-CM

## 2021-03-17 DIAGNOSIS — M810 Age-related osteoporosis without current pathological fracture: Secondary | ICD-10-CM

## 2021-03-17 DIAGNOSIS — E782 Mixed hyperlipidemia: Secondary | ICD-10-CM | POA: Diagnosis not present

## 2021-03-17 DIAGNOSIS — K5909 Other constipation: Secondary | ICD-10-CM | POA: Diagnosis not present

## 2021-03-17 NOTE — Assessment & Plan Note (Signed)
BPPV, takes prn Meclizine.

## 2021-03-17 NOTE — Assessment & Plan Note (Signed)
HTN, blood pressure is controlled onDiltiazem, ASA 81mg  qd.Bun/creat 21/0.7 11/24/20

## 2021-03-17 NOTE — Assessment & Plan Note (Signed)
OP, takes Ca, Vit D, has been Reclast before.

## 2021-03-17 NOTE — Assessment & Plan Note (Signed)
AFib,heart rate is in control, takes Diltiazem,ASA, Hgb 12.9 11/24/20

## 2021-03-17 NOTE — Assessment & Plan Note (Signed)
Hyperlipidemia, takes Atorvastatin 10mg  qd.LDL 88 01/07/21

## 2021-03-17 NOTE — Assessment & Plan Note (Addendum)
Will try Sertraline $RemoveBefore'25mg'[id number]$  qd, update CBC/diff, CMP/eGFR 03/18/21 Na 138, K 3.9, Bun 25, creat 0.78, eGFR 65, wbc 6.9, Hgb 13.0, plt 251, neutrophils 73.9%

## 2021-03-17 NOTE — Assessment & Plan Note (Signed)
Hyponatremia,Na 1350/03/12

## 2021-03-17 NOTE — Progress Notes (Addendum)
Location:   [facility] [place] Room Number: 25 Place of Service:  SNF (31) Provider: Lennie Odor Leilanee Righetti NP  Myrical Andujo X, NP  Patient Care Team: Dorraine Ellender X, NP as PCP - General (Internal Medicine) Nahser, Wonda Cheng, MD as PCP - Cardiology (Cardiology) Melina Schools, MD as Consulting Physician (Orthopedic Surgery) Leonie Joliyah Lippens, MD as Consulting Physician (Cardiology) Darian Ace X, NP as Nurse Practitioner (Internal Medicine)  Extended Emergency Contact Information Primary Emergency Contact: [name],Amayia Address: [place] [address] Johnnette Litter of [place] Phone: [phone] Mobile Phone: [phone] Relation: Niece Secondary Emergency Contact: [name],[name]  [place] Mobile Phone: [phone] Relation: Niece  Code Status: DNR Goals of care: Advanced Directive information Advanced Directives 03/17/2021  Does Patient Have a Medical Advance Directive? Yes  Type of Paramedic of [place];Living will;Out of facility DNR (pink MOST or yellow form)  Does patient want to make changes to medical advance directive? No - Patient declined  Copy of [place] in Chart? Yes - validated most recent copy scanned in chart (See row information)  Would patient like information on creating a medical advance directive? -  Pre-existing out of facility DNR order (yellow form or pink MOST form) Yellow form placed in chart (order not valid for inpatient use);Pink MOST form placed in chart (order not valid for inpatient use)     Chief Complaint  Patient presents with  . Acute Visit    Anxiety    HPI:  Pt is a 87 y.o. female seen today for an acute visit for pacing, anxious, looking for front desk, talking loud, repetitive. Afebrile, no noted cough, SOB, abd pain, or focal weakness.   AFib,heart rate is in control, takes Diltiazem,ASA, Hgb 12.9 11/24/20 [facility]  [facility], on Memantine 10mg  bid for memory.  HTN, blood pressure is controlled onDiltiazem, ASA 81mg  qd.Bun/creat 21/0.7 11/24/20 Hyperlipidemia, takes Atorvastatin 10mg  qd.LDL 88 01/07/21 OP, takes Ca, Vit D, has been Reclast before. Constipation, takes Colace bid, MiraLax qod. Hyponatremia,Na 1350/03/12 BPPV, takes prn Meclizine.   Past Medical History:  Diagnosis Date  . Arteriosclerotic cardiovascular disease   . Bee sting reaction 08/23/2016   Tongue swelling and difficulty swallowing /notes 08/23/2016  . Constipation   . Degenerative joint disease of right hip 10/15/14  . Fracture of multiple pubic rami (HCC) 10/15/14   Right inferior and superior pubic rami  . HOH (hard of hearing)    Wears bilateral hearing aids  . Hyperlipidemia   . Hypertension   . Macular degeneration   . Memory deficit   . Osteoporosis   . Pain in right foot   . Palpitations   . Paroxysmal atrial fibrillation (HCC)   . Thyroid nodule    Status post surgery  . Ulcer of hard palate   . Vertigo    Past Surgical History:  Procedure Laterality Date  . ABDOMINAL HYSTERECTOMY  1968  . APPENDECTOMY  1940's  . COLONOSCOPY    . FOOT SURGERY Left   . HARDWARE REMOVAL Right 02/16/2016   Procedure: HARDWARE REMOVAL RT HIP;  Surgeon: Renette Butters, MD;  Location: [place];  Service: Orthopedics;  Laterality: Right;  . HIP ARTHROPLASTY Right 02/16/2016   Procedure: ARTHROPLASTY BIPOLAR HIP (HEMIARTHROPLASTY);  Surgeon: Renette Butters, MD;  Location: [place];  Service: Orthopedics;  Laterality: Right;  . INTRAMEDULLARY (IM) NAIL INTERTROCHANTERIC Right 09/11/2015  Procedure: INTRAMEDULLARY (IM) NAIL INTERTROCHANTRIC;  Surgeon: Renette Butters, MD;  Location: [place];  Service: Orthopedics;  Laterality: Right;  . KNEE SURGERY      No Known Allergies  Allergies as of 03/17/2021   No Known Allergies     Medication List        Accurate as of March 17, 2021 11:59 PM. If you have any questions, ask your nurse or doctor.        aspirin EC 81 MG tablet Take 81 mg by mouth daily. Swallow whole.   atorvastatin 10 MG tablet Commonly known as: LIPITOR Take 10 mg by mouth daily.   Biotin 10 MG Tabs Take by mouth daily.   calcium carbonate 1500 (600 Ca) MG Tabs tablet Commonly known as: OSCAL Take 600 mg of elemental calcium by mouth 2 (two) times daily with a meal.   diltiazem 180 MG 24 hr capsule Commonly known as: CARDIZEM CD Take 1 capsule (180 mg total) by mouth daily.   docusate sodium 100 MG capsule Commonly known as: COLACE Take 100 mg by mouth 2 (two) times daily.   EPINEPHrine 0.3 mg/0.3 mL Soaj injection Commonly known as: EPI-PEN Inject 0.3 mLs (0.3 mg total) into the muscle once as needed (anaphylaxis/allergic reaction).   lactose free nutrition Liqd Take 237 mLs by mouth. Once a day at breakfast   meclizine 12.5 MG tablet Commonly known as: ANTIVERT Take 12.5 mg by mouth daily as needed for dizziness.   melatonin 3 MG Tabs tablet Take 3 mg by mouth at bedtime as needed (sleep).   memantine 10 MG tablet Commonly known as: NAMENDA Take 10 mg by mouth 2 (two) times daily.   polyethylene glycol 17 g packet Commonly known as: MIRALAX / GLYCOLAX Take 17 g by mouth every other day.   Therems-M Tabs Take 1 tablet by mouth daily.   PreserVision AREDS Tabs Take 2 tablets by mouth daily.   Vitamin D3 50 MCG (2000 UT) Tabs Take 2,000 Units by mouth daily.       Review of Systems  Constitutional: Negative for activity change, appetite change and fever.  HENT: Positive for hearing loss. Negative for congestion and voice change.        Hears better left ear  Respiratory: Negative for cough and shortness of breath.   Cardiovascular: Negative for leg swelling.  Gastrointestinal: Negative for abdominal pain and constipation.  Genitourinary: Negative for dysuria and urgency.   Musculoskeletal: Positive for gait problem.  Skin: Negative for color change.  Neurological: Negative for speech difficulty, weakness, light-headedness and headaches.       Memory lapses. Chronic on and off dizziness  Psychiatric/Behavioral: Positive for confusion. Negative for behavioral problems and sleep disturbance. The patient is nervous/anxious.     Immunization History  Administered Date(s) Administered  . Influenza Whole 08/25/2018  . Influenza, High Dose Seasonal PF 08/24/2019  . Influenza-Unspecified 09/07/2016, 09/12/2017, 09/02/2020  . Moderna Sars-Covid-2 Vaccination 11/23/2019, 12/21/2019, 09/29/2020  . PPD Test 02/18/2016  . Pneumococcal Conjugate-13 09/18/2017  . Pneumococcal-Unspecified 10/22/2018  . Tdap 10/07/2012   Pertinent  Health Maintenance Due  Topic Date Due  . INFLUENZA VACCINE  06/21/2021  . DEXA SCAN  Completed  . PNA vac Low Risk Adult  Completed   Fall Risk  08/17/2018 08/03/2017 10/24/2014  Falls in the past year? No No Yes  Number falls in past yr: - - 1  Injury with Fall? - - Yes  Risk Factor Category  - - High Fall  Risk  Risk for fall due to : - - History of fall(s)   Functional Status Survey:    Vitals:   03/17/21 1541  BP: (!) 110/58  Pulse: (!) 58  Temp: (!) 97.3 F (36.3 C)  Weight: 123 lb 8 oz (56 kg)  Height: $Remove'5\' 1"'oNSEGEG$  (1.549 m)   Body mass index is 23.34 kg/m. Physical Exam Vitals and nursing note reviewed.  Constitutional:      Appearance: Normal appearance. She is normal weight.  HENT:     Head: Normocephalic and atraumatic.     Mouth/Throat:     Mouth: Mucous membranes are moist.  Eyes:     Extraocular Movements: Extraocular movements intact.     Conjunctiva/sclera: Conjunctivae normal.     Pupils: Pupils are equal, round, and reactive to light.  Cardiovascular:     Rate and Rhythm: Normal rate and regular rhythm.     Heart sounds: No murmur heard.   Pulmonary:     Effort: Pulmonary effort is normal.     Breath  sounds: No rales.  Abdominal:     General: Bowel sounds are normal.     Palpations: Abdomen is soft.     Tenderness: There is no abdominal tenderness.  Musculoskeletal:     Cervical back: Normal range of motion and neck supple.     Right lower leg: No edema.     Left lower leg: No edema.  Skin:    General: Skin is warm and dry.  Neurological:     General: No focal deficit present.     Mental Status: She is alert. Mental status is at baseline.     Gait: Gait abnormal.     Comments: Oriented to person, place.   Psychiatric:        Mood and Affect: Mood normal.        Behavior: Behavior normal.        Thought Content: Thought content normal.     Labs reviewed: Recent Labs    06/03/20 0952 09/11/20 0000 11/24/20 0000  NA 131* 135* 135*  K 3.7 4.3 4.2  CL 99 102 103  CO2 21* 26* 24*  GLUCOSE 107*  --   --   BUN $Re'13 15 21  'Uxe$ CREATININE 0.59 0.8 0.7  CALCIUM 9.8 10.0 9.5   Recent Labs    06/03/20 0952 09/11/20 0000 11/24/20 0000  AST $Re'26 19 19  'hIA$ ALT $R'19 14 14  'Sk$ ALKPHOS 51 52 57  BILITOT 0.9  --   --   PROT 7.1  --   --   ALBUMIN 4.1 3.9 3.9   Recent Labs    06/03/20 1022 09/11/20 0000 11/24/20 0000  WBC 6.5 6.2 6.4  NEUTROABS 5.3 4,365.00 4,518.00  HGB 13.2 12.8 12.9  HCT 39.2 37 38  MCV 94.2  --   --   PLT 266 281 292   Lab Results  Component Value Date   TSH 2.703 06/03/2020   Lab Results  Component Value Date   HGBA1C 5.4 03/14/2018   Lab Results  Component Value Date   CHOL 178 01/07/2021   HDL 65 01/07/2021   LDLCALC 88 01/07/2021   TRIG 145 01/07/2021   CHOLHDL 2.6 03/14/2018    Significant Diagnostic Results in last 30 days:  Intravitreal Injection, Pharmacologic Agent - OD - Right Eye  Result Date: 03/01/2021 Time Out 03/01/2021. 10:29 AM. Confirmed correct patient, procedure, site, and patient consented. Anesthesia Topical anesthesia was used. Anesthetic medications included Akten 3.5%. Procedure  Preparation included Tobramycin 0.3%, 10%  betadine to eyelids, 5% betadine to ocular surface, Ofloxacin . A supplied needle was used. Injection: 2.5 mg Bevacizumab (AVASTIN) 2.5mg /0.80mL SOSY   NDC: 68341-962-22   Route: Intravitreal, Site: Right Eye Post-op Post injection exam found visual acuity of at least counting fingers. The patient tolerated the procedure well. There were no complications. The patient received written and verbal post procedure care education. Post injection medications were not given.   OCT, Retina - OU - Both Eyes  Result Date: 03/01/2021 Right Eye Quality was good. Scan locations included subfoveal. Central Foveal Thickness: 543. Progression has worsened. Findings include retinal drusen , choroidal neovascular membrane, cystoid macular edema, abnormal foveal contour, outer retinal atrophy, central retinal atrophy. Left Eye Quality was good. Scan locations included subfoveal. Central Foveal Thickness: 321. Progression has been stable. Findings include abnormal foveal contour, subretinal fluid, pigment epithelial detachment. Notes OD with chronically active CN VM, CME are stable centrally  at the 7-week interval today repeat intravitreal Avastin today. 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   Essential hypertension HTN, blood pressure is controlled onDiltiazem, ASA 81mg  qd.Bun/creat 21/0.7  11/24/20   Hyperlipidemia Hyperlipidemia, takes Atorvastatin 10mg  qd.LDL 88 01/07/21   Osteoporosis without current pathological fracture OP, takes Ca, Vit D, has been Reclast before.   Chronic constipation Constipation, takes Colace bid, MiraLax qod.   Hyponatremia Hyponatremia,Na 1350/03/12  Benign paroxysmal positional vertigo BPPV, takes prn Meclizine.      Family/ staff Communication: plan of care reviewed with the patient and charge nurse.   Labs/tests ordered: CBC/diff, CMP/eGFR  Time spend 35 minutes.

## 2021-03-17 NOTE — Assessment & Plan Note (Signed)
The patient resides in [facility] for safety, care assistance, on Memantine 10mg bid for memory.   

## 2021-03-17 NOTE — Assessment & Plan Note (Signed)
Constipation, takes Colace bid, MiraLax qod.

## 2021-03-18 ENCOUNTER — Encounter: Payer: Self-pay | Admitting: Nurse Practitioner

## 2021-03-18 DIAGNOSIS — I10 Essential (primary) hypertension: Secondary | ICD-10-CM | POA: Diagnosis not present

## 2021-03-18 DIAGNOSIS — D649 Anemia, unspecified: Secondary | ICD-10-CM | POA: Diagnosis not present

## 2021-03-19 LAB — COMPREHENSIVE METABOLIC PANEL
Albumin: 4 (ref 3.5–5.0)
Calcium: 9.9 (ref 8.7–10.7)
GFR calc Af Amer: 75
GFR calc non Af Amer: 65
Globulin: 3

## 2021-03-19 LAB — CBC: RBC: 4.1 (ref 3.87–5.11)

## 2021-03-19 LAB — CBC AND DIFFERENTIAL
HCT: 38 (ref 36–46)
Hemoglobin: 13 (ref 12.0–16.0)
Neutrophils Absolute: 5099
Platelets: 251 (ref 150–399)
WBC: 6.9

## 2021-03-19 LAB — BASIC METABOLIC PANEL
BUN: 25 — AB (ref 4–21)
CO2: 26 — AB (ref 13–22)
Chloride: 104 (ref 99–108)
Creatinine: 0.8 (ref 0.5–1.1)
Glucose: 84
Potassium: 3.9 (ref 3.4–5.3)
Sodium: 138 (ref 137–147)

## 2021-03-19 LAB — HEPATIC FUNCTION PANEL
ALT: 15 (ref 7–35)
AST: 21 (ref 13–35)
Alkaline Phosphatase: 64 (ref 25–125)
Bilirubin, Total: 0.7

## 2021-03-29 ENCOUNTER — Encounter: Payer: Self-pay | Admitting: Nurse Practitioner

## 2021-03-29 ENCOUNTER — Non-Acute Institutional Stay (SKILLED_NURSING_FACILITY): Payer: Medicare PPO | Admitting: Nurse Practitioner

## 2021-03-29 DIAGNOSIS — I480 Paroxysmal atrial fibrillation: Secondary | ICD-10-CM

## 2021-03-29 DIAGNOSIS — F0150 Vascular dementia without behavioral disturbance: Secondary | ICD-10-CM | POA: Diagnosis not present

## 2021-03-29 DIAGNOSIS — F0391 Unspecified dementia with behavioral disturbance: Secondary | ICD-10-CM | POA: Diagnosis not present

## 2021-03-29 DIAGNOSIS — I10 Essential (primary) hypertension: Secondary | ICD-10-CM

## 2021-03-29 DIAGNOSIS — H8110 Benign paroxysmal vertigo, unspecified ear: Secondary | ICD-10-CM | POA: Diagnosis not present

## 2021-03-29 DIAGNOSIS — K5909 Other constipation: Secondary | ICD-10-CM | POA: Diagnosis not present

## 2021-03-29 DIAGNOSIS — E871 Hypo-osmolality and hyponatremia: Secondary | ICD-10-CM | POA: Diagnosis not present

## 2021-03-29 DIAGNOSIS — F0394 Unspecified dementia, unspecified severity, with anxiety: Secondary | ICD-10-CM

## 2021-03-29 DIAGNOSIS — E782 Mixed hyperlipidemia: Secondary | ICD-10-CM

## 2021-03-29 DIAGNOSIS — M810 Age-related osteoporosis without current pathological fracture: Secondary | ICD-10-CM

## 2021-03-29 NOTE — Assessment & Plan Note (Signed)
Improving, continue Sertraline

## 2021-03-29 NOTE — Assessment & Plan Note (Signed)
Hyperlipidemia, takes Atorvastatin 10mg  qd.[facility] 01/07/21

## 2021-03-29 NOTE — Assessment & Plan Note (Signed)
BPPV, takes prn Meclizine.   

## 2021-03-29 NOTE — Assessment & Plan Note (Addendum)
blood pressure runs low,  Reduce Diltiazem to 120mg  CD, continue  ASA 81mg  qd.Bun/creat 25/0.78 03/18/21,  VS bid x 7 days.

## 2021-03-29 NOTE — Assessment & Plan Note (Signed)
The patient resides in [facility] for safety, care assistance, on Memantine 10mg bid for memory.   

## 2021-03-29 NOTE — Assessment & Plan Note (Signed)
OP, takes Ca, Vit D, has been Reclast before.

## 2021-03-29 NOTE — Assessment & Plan Note (Addendum)
Hyponatremia,Na 138 03/18/21

## 2021-03-29 NOTE — Progress Notes (Signed)
Location:   [facility] Room Number: 25 Place of Service:  SNF (31) Provider: Arna Willis Candice Obriant NP  Candice Willis X, NP  Patient Care Team: Candice Willis X, NP as PCP - General (Internal Medicine) Nahser, Deloris Ping, MD as PCP - Cardiology (Cardiology) Candice Lick, MD as Consulting Physician (Orthopedic Surgery) Candice Lex, MD as Consulting Physician (Cardiology) Candice Willis X, NP as Nurse Practitioner (Internal Medicine)  Extended Emergency Contact Information Primary Emergency Contact: Candice Willis Address: [address]           [place], [place] [address] [place] Home Phone: [phone] Mobile Phone: [phone] Relation: Niece Secondary Emergency Contact: Candice Willis  [place] Mobile Phone: [phone] Relation: Niece  Code Status:  DNR Goals of care: Advanced Directive information Advanced Directives 03/17/2021  Does Patient Have a Medical Advance Directive? Yes  Type of Estate agent of [place];Living will;Out of facility DNR (pink MOST or yellow form)  Does patient want to make changes to medical advance directive? No - Patient declined  Copy of Healthcare Power of Attorney in Chart? Yes - validated most recent copy scanned in chart (See row information)  Would patient like information on creating a medical advance directive? -  Pre-existing out of facility DNR order (yellow form or pink MOST form) Yellow form placed in chart (order not valid for inpatient use);Pink MOST form placed in chart (order not valid for inpatient use)     Chief Complaint  Patient presents with  . Medical Management of Chronic Issues    HPI:  Pt is a 87 y.o. female seen today for medical management of chronic diseases.      AFib,heart rate is in control, takes Diltiazem,ASA, Hgb 13 03/18/21 [facility], on Memantine 10mg  bid for memory.  HTN, blood pressure runs  low,  onDiltiazem, ASA 81mg  qd.Bun/creat 25/0.78 03/18/21 Hyperlipidemia, takes Atorvastatin 10mg  qd.11-26-2006 01/07/21 OP, takes Ca, Vit D, has been Reclast before. Constipation, takes Colace bid, MiraLax qod. Hyponatremia,Na 138 03/18/21 BPPV, takes prn Meclizine.  Anxiety, stabilizing since Sertraline 03/17/21   Past Medical History:  Diagnosis Date  . Arteriosclerotic cardiovascular disease   . Bee sting reaction 08/23/2016   Tongue swelling and difficulty swallowing /notes 08/23/2016  . Constipation   . Degenerative joint disease of right hip 10/15/14  . Fracture of multiple pubic rami (HCC) 10/15/14   Right inferior and superior pubic rami  . HOH (hard of hearing)    Wears bilateral hearing aids  . Hyperlipidemia   . Hypertension   . Macular degeneration   . Memory deficit   . Osteoporosis   . Pain in right foot   . Palpitations   . Paroxysmal atrial fibrillation (HCC)   . Thyroid nodule    Status post surgery  . Ulcer of hard palate   . Vertigo    Past Surgical History:  Procedure Laterality Date  . ABDOMINAL HYSTERECTOMY  1968  . APPENDECTOMY  1940's  . COLONOSCOPY    . FOOT SURGERY Left   . HARDWARE REMOVAL Right 02/16/2016   Procedure: HARDWARE REMOVAL RT HIP;  Surgeon: 10/17/14, MD;  Location: [facility] OR;  Service: Orthopedics;  Laterality: Right;  . HIP ARTHROPLASTY Right 02/16/2016   Procedure: ARTHROPLASTY BIPOLAR HIP (HEMIARTHROPLASTY);  Surgeon: Sheral Apley, MD;  Location: [facility] OR;  Service: Orthopedics;  Laterality: Right;  . INTRAMEDULLARY (IM) NAIL INTERTROCHANTERIC Right 09/11/2015   Procedure: INTRAMEDULLARY (IM) NAIL INTERTROCHANTRIC;  Surgeon: Sheral Apleyimothy D Murphy, MD;  [facility];  Service: Orthopedics;  Laterality: Right;  . KNEE SURGERY      No Known Allergies  Allergies as of 03/29/2021   No Known Allergies     Medication List       Accurate as of Mar 29, 2021 11:59 PM. If  you have any questions, ask your nurse or doctor.        aspirin EC 81 MG tablet Take 81 mg by mouth daily. Swallow whole.   atorvastatin 10 MG tablet Commonly known as: LIPITOR Take 10 mg by mouth daily.   Biotin 10 MG Tabs Take by mouth daily.   calcium carbonate 1500 (600 Ca) MG Tabs tablet Commonly known as: OSCAL Take 600 mg of elemental calcium by mouth 2 (two) times daily with a meal.   diltiazem 180 MG 24 hr capsule Commonly known as: CARDIZEM CD Take 1 capsule (180 mg total) by mouth daily.   docusate sodium 100 MG capsule Commonly known as: COLACE Take 100 mg by mouth 2 (two) times daily.   EPINEPHrine 0.3 mg/0.3 mL Soaj injection Commonly known as: EPI-PEN Inject 0.3 mLs (0.3 mg total) into the muscle once as needed (anaphylaxis/allergic reaction).   lactose free nutrition Liqd Take 237 mLs by mouth. Once a day at breakfast   meclizine 12.5 MG tablet Commonly known as: ANTIVERT Take 12.5 mg by mouth daily as needed for dizziness.   melatonin 3 MG Tabs tablet Take 3 mg by mouth at bedtime as needed (sleep).   memantine 10 MG tablet Commonly known as: NAMENDA Take 10 mg by mouth 2 (two) times daily.   polyethylene glycol 17 g packet Commonly known as: MIRALAX / GLYCOLAX Take 17 g by mouth every other day.   Therems-M Tabs Take 1 tablet by mouth daily.   PreserVision AREDS Tabs Take 2 tablets by mouth daily.   Vitamin D3 50 MCG (2000 UT) Tabs Take 2,000 Units by mouth daily.       Review of Systems  Constitutional: Negative for activity change, appetite change and fever.  HENT: Positive for hearing loss. Negative for congestion and voice change.        Hears better left ear  Respiratory: Negative for cough and shortness of breath.   Cardiovascular: Negative for leg swelling.  Gastrointestinal: Negative for abdominal pain and constipation.  Genitourinary: Negative for dysuria and urgency.  Musculoskeletal: Positive for gait problem.  Skin:  Negative for color change.  Neurological: Negative for speech difficulty, weakness and light-headedness.       Memory lapses. Chronic on and off dizziness  Psychiatric/Behavioral: Positive for confusion. Negative for behavioral problems and sleep disturbance. The patient is nervous/anxious.        Mood is stabilizing.     Immunization History  Administered Date(s) Administered  . Influenza Whole 08/25/2018  . Influenza, High Dose Seasonal PF 08/24/2019  . Influenza-Unspecified 09/07/2016, 09/12/2017, 09/02/2020  . Moderna Sars-Covid-2 Vaccination 11/23/2019, 12/21/2019, 09/29/2020  . PPD Test 02/18/2016  . Pneumococcal Conjugate-13 09/18/2017  . Pneumococcal-Unspecified 10/22/2018  . Tdap 10/07/2012   Pertinent  Health Maintenance Due  Topic Date Due  . INFLUENZA VACCINE  06/21/2021  . DEXA SCAN  Completed  . PNA vac Low Risk Adult  Completed   Fall Risk  08/17/2018 08/03/2017 10/24/2014  Falls in the past year? No No Yes  Number falls in past yr: - - 1  Injury with Fall? - - Yes  Risk Factor Category  - -  High Fall Risk  Risk for fall due to : - - History of fall(s)   Functional Status Survey:    Vitals:   03/29/21 1612  BP: (!) 95/57  Pulse: 73  Resp: 18  Temp: (!) 97.1 F (36.2 C)  SpO2: 94%   There is no height or weight on file to calculate BMI. Physical Exam Vitals and nursing note reviewed.  Constitutional:      Appearance: Normal appearance. She is normal weight.  HENT:     Head: Normocephalic and atraumatic.     Mouth/Throat:     Mouth: Mucous membranes are moist.  Eyes:     Extraocular Movements: Extraocular movements intact.     Conjunctiva/sclera: Conjunctivae normal.     Pupils: Pupils are equal, round, and reactive to light.  Cardiovascular:     Rate and Rhythm: Normal rate and regular rhythm.     Heart sounds: No murmur heard.   Pulmonary:     Effort: Pulmonary effort is normal.     Breath sounds: No rales.  Abdominal:     General: Bowel  sounds are normal.     Palpations: Abdomen is soft.     Tenderness: There is no abdominal tenderness.  Musculoskeletal:     Cervical back: Normal range of motion and neck supple.     Right lower leg: No edema.     Left lower leg: No edema.  Skin:    General: Skin is warm and dry.  Neurological:     General: No focal deficit present.     Mental Status: She is alert. Mental status is at baseline.     Gait: Gait abnormal.     Comments: Oriented to person, place.   Psychiatric:        Mood and Affect: Mood normal.        Behavior: Behavior normal.        Thought Content: Thought content normal.     Labs reviewed: Recent Labs    06/03/20 0952 09/11/20 0000 11/24/20 0000  NA 131* 135* 135*  K 3.7 4.3 4.2  CL 99 102 103  CO2 21* 26* 24*  GLUCOSE 107*  --   --   BUN 13 15 21   CREATININE 0.59 0.8 0.7  CALCIUM 9.8 10.0 9.5   Recent Labs    06/03/20 0952 09/11/20 0000 11/24/20 0000  AST 26 19 19   ALT 19 14 14   ALKPHOS 51 52 57  BILITOT 0.9  --   --   PROT 7.1  --   --   ALBUMIN 4.1 3.9 3.9   Recent Labs    06/03/20 1022 09/11/20 0000 11/24/20 0000  WBC 6.5 6.2 6.4  NEUTROABS 5.3 4,365.00 4,518.00  HGB 13.2 12.8 12.9  HCT 39.2 37 38  MCV 94.2  --   --   PLT 266 281 292   Lab Results  Component Value Date   TSH 2.703 06/03/2020   Lab Results  Component Value Date   HGBA1C 5.4 03/14/2018   Lab Results  Component Value Date   CHOL 178 01/07/2021   HDL 65 01/07/2021   LDLCALC 88 01/07/2021   TRIG 145 01/07/2021   CHOLHDL 2.6 03/14/2018    Significant Diagnostic Results in last 30 days:  Intravitreal Injection, Pharmacologic Agent - OD - Right Eye  Result Date: 03/01/2021 Time Out 03/01/2021. 10:29 AM. Confirmed correct patient, procedure, site, and patient consented. Anesthesia Topical anesthesia was used. Anesthetic medications included Akten 3.5%. Procedure Preparation included  Tobramycin 0.3%, 10% betadine to eyelids, 5% betadine to ocular surface,  Ofloxacin . A supplied needle was used. Injection: 2.5 mg Bevacizumab (AVASTIN) 2.5mg /0.44mL SOSY   NDC: 13086-578-46   Route: Intravitreal, Site: Right Eye Post-op Post injection exam found visual acuity of at least counting fingers. The patient tolerated the procedure well. There were no complications. The patient received written and verbal post procedure care education. Post injection medications were not given.   OCT, Retina - OU - Both Eyes  Result Date: 03/01/2021 Right Eye Quality was good. Scan locations included subfoveal. Central Foveal Thickness: 543. Progression has worsened. Findings include retinal drusen , choroidal neovascular membrane, cystoid macular edema, abnormal foveal contour, outer retinal atrophy, central retinal atrophy. Left Eye Quality was good. Scan locations included subfoveal. Central Foveal Thickness: 321. Progression has been stable. Findings include abnormal foveal contour, subretinal fluid, pigment epithelial detachment. Notes OD with chronically active CN VM, CME are stable centrally  at the 7-week interval today repeat intravitreal Avastin today. 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    PAF (paroxysmal atrial fibrillation) (HCC) AFib,heart rate is in control, takes Diltiazem,ASA, Hgb 13 03/18/21   Vascular dementia [facility]) [facility], on Memantine 10mg  bid for memory.   Hyperlipidemia Hyperlipidemia, takes Atorvastatin 10mg  qd.03/20/21 01/07/21  Osteoporosis without current pathological fracture OP, takes Ca, Vit D, has been Reclast before.   Chronic  constipation Constipation, takes Colace bid, MiraLax qod.   Hyponatremia Hyponatremia,Na 138 03/18/21  Benign paroxysmal positional vertigo BPPV, takes prn Meclizine.   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   Labs/tests ordered:  none  Time spend 35 minutes.

## 2021-03-29 NOTE — Assessment & Plan Note (Addendum)
AFib,heart rate is in control, takes Diltiazem,ASA, Hgb 13 03/18/21

## 2021-03-29 NOTE — Assessment & Plan Note (Signed)
Constipation, takes Colace bid, MiraLax qod.

## 2021-03-30 ENCOUNTER — Encounter: Payer: Self-pay | Admitting: Nurse Practitioner

## 2021-04-08 ENCOUNTER — Non-Acute Institutional Stay (SKILLED_NURSING_FACILITY): Payer: Medicare PPO | Admitting: Nurse Practitioner

## 2021-04-08 ENCOUNTER — Encounter: Payer: Self-pay | Admitting: Nurse Practitioner

## 2021-04-08 DIAGNOSIS — K5909 Other constipation: Secondary | ICD-10-CM

## 2021-04-08 DIAGNOSIS — I480 Paroxysmal atrial fibrillation: Secondary | ICD-10-CM | POA: Diagnosis not present

## 2021-04-08 DIAGNOSIS — I10 Essential (primary) hypertension: Secondary | ICD-10-CM | POA: Diagnosis not present

## 2021-04-08 DIAGNOSIS — M810 Age-related osteoporosis without current pathological fracture: Secondary | ICD-10-CM | POA: Diagnosis not present

## 2021-04-08 DIAGNOSIS — H8110 Benign paroxysmal vertigo, unspecified ear: Secondary | ICD-10-CM

## 2021-04-08 DIAGNOSIS — E782 Mixed hyperlipidemia: Secondary | ICD-10-CM

## 2021-04-08 DIAGNOSIS — F0150 Vascular dementia without behavioral disturbance: Secondary | ICD-10-CM

## 2021-04-08 DIAGNOSIS — F0391 Unspecified dementia with behavioral disturbance: Secondary | ICD-10-CM

## 2021-04-08 DIAGNOSIS — E871 Hypo-osmolality and hyponatremia: Secondary | ICD-10-CM

## 2021-04-08 DIAGNOSIS — F0394 Unspecified dementia, unspecified severity, with anxiety: Secondary | ICD-10-CM

## 2021-04-08 NOTE — Assessment & Plan Note (Signed)
takes Colace bid, MiraLax qod.  

## 2021-04-08 NOTE — Assessment & Plan Note (Signed)
blood pressure is controlled, decreasedDiltiazem 120mg  03/29/21, ASA 81mg  qd.Bun/creat 25/0.78 03/18/21

## 2021-04-08 NOTE — Assessment & Plan Note (Signed)
takes prn Meclizine °

## 2021-04-08 NOTE — Assessment & Plan Note (Signed)
The patient resides in [facility] for safety, care assistance, on Memantine 10mg bid for memory.   

## 2021-04-08 NOTE — Assessment & Plan Note (Signed)
heart rate is in control, takes Diltiazem, ASA, Hgb 13 03/18/21 

## 2021-04-08 NOTE — Assessment & Plan Note (Signed)
Na 138 03/18/21

## 2021-04-08 NOTE — Assessment & Plan Note (Signed)
takes Ca, Vit D, has been Reclast before.  °

## 2021-04-08 NOTE — Assessment & Plan Note (Signed)
reported increased anxiety in the past week,  spent less time in her room, increased pacing, wandering, asking where do I go, what do I do often, agitated at times, pressured speech, seems distressed by false beliefs and not knowing what to do, difficulty to be redirected.   Anxiety, initially stabilizing since Sertraline 03/17/21  Will increase Sertraline to $RemoveBefor'50mg'[id number]$  qd, prn Lorazepam 0.$RemoveBeforeD'5mg'[id number]$  q6hr x 14 days, update CBC/diff, CMP/eGFR, may consider Depakote if no better.

## 2021-04-08 NOTE — Progress Notes (Signed)
Location:   SNF [facility] [facility] Room Number: N025 Place of Service:  SNF (31) Provider: Lennie Odor Malaina Mortellaro NP  Yves Fodor X, NP  Patient Care Team: Erasmo Vertz X, NP as PCP - General (Internal Medicine) Nahser, Wonda Cheng, MD as PCP - Cardiology (Cardiology) Melina Schools, MD as Consulting Physician (Orthopedic Surgery) Leonie Dessie Tatem, MD as Consulting Physician (Cardiology) Fahima Cifelli X, NP as Nurse Practitioner (Internal Medicine)  Extended Emergency Contact Information Primary Emergency Contact: [name],Jayna Address: [address] [address] Johnnette Litter of [place] Phone: [phone] Mobile Phone: [phone] Relation: Niece Secondary Emergency Contact: [name],[name]  [place] Mobile Phone: [phone] Relation: Niece  Code Status: DNR Goals of care: Advanced Directive information Advanced Directives 04/08/2021  Does Patient Have a Medical Advance Directive? Yes  Type of Paramedic of [place];Living will;Out of facility DNR (pink MOST or yellow form)  Does patient want to make changes to medical advance directive? No - Patient declined  Copy of [place] in Chart? Yes - validated most recent copy scanned in chart (See row information)  Would patient like information on creating a medical advance directive? -  Pre-existing out of facility DNR order (yellow form or pink MOST form) Yellow form placed in chart (order not valid for inpatient use);Pink MOST form placed in chart (order not valid for inpatient use)     Chief Complaint  Patient presents with  . Acute Visit    Patient presents today for behavioral changes.    HPI:  Pt is a 87 y.o. female seen today for an acute visit for reported increased anxiety in the past week,  spent less time in her room, increased pacing, wandering, asking where do I go, what do I do often, agitated at times, pressured speech, seems distressed by false beliefs  and not knowing what to do, difficulty to be redirected.   Anxiety, initially stabilizing since Sertraline 03/17/21  AFib,heart rate is in control, takes Diltiazem,ASA, Hgb 13 03/18/21 [facility], on Memantine 10mg  bid for memory.  HTN, blood pressure is controlled, decreasedDiltiazem 120mg  03/29/21, ASA 81mg  qd.Bun/creat 25/0.78 03/18/21 Hyperlipidemia, takes Atorvastatin 10mg  qd.VVO16 01/07/21 OP, takes Ca, Vit D, has been Reclast before. Constipation, takes Colace bid, MiraLax qod. Hyponatremia,Na 138 03/18/21 BPPV, takes prn Meclizine.                Past Medical History:  Diagnosis Date  . Arteriosclerotic cardiovascular disease   . Bee sting reaction 08/23/2016   Tongue swelling and difficulty swallowing /notes 08/23/2016  . Constipation   . Degenerative joint disease of right hip 10/15/14  . Fracture of multiple pubic rami (HCC) 10/15/14   Right inferior and superior pubic rami  . HOH (hard of hearing)    Wears bilateral hearing aids  . Hyperlipidemia   . Hypertension   . Macular degeneration   . Memory deficit   . Osteoporosis   . Pain in right foot   . Palpitations   . Paroxysmal atrial fibrillation (HCC)   . Thyroid nodule    Status post surgery  . Ulcer of hard palate   . Vertigo    Past Surgical History:  Procedure Laterality Date  . ABDOMINAL HYSTERECTOMY  1968  . APPENDECTOMY  1940's  . COLONOSCOPY    . FOOT SURGERY Left   . HARDWARE REMOVAL Right 02/16/2016   Procedure: HARDWARE REMOVAL  RT HIP;  Surgeon: Renette Butters, MD;  Location: [facility];  Service: Orthopedics;  Laterality: Right;  . HIP ARTHROPLASTY Right 02/16/2016   Procedure: ARTHROPLASTY BIPOLAR HIP (HEMIARTHROPLASTY);  Surgeon: Renette Butters, MD;  Location: [facility];  Service: Orthopedics;  Laterality: Right;  . INTRAMEDULLARY (IM) NAIL INTERTROCHANTERIC Right  09/11/2015   Procedure: INTRAMEDULLARY (IM) NAIL INTERTROCHANTRIC;  Surgeon: Renette Butters, MD;  Location: [facility];  Service: Orthopedics;  Laterality: Right;  . KNEE SURGERY      No Known Allergies  Allergies as of 04/08/2021   No Known Allergies     Medication List       Accurate as of Apr 08, 2021 11:59 PM. If you have any questions, ask your nurse or doctor.        aspirin EC 81 MG tablet Take 81 mg by mouth daily. Swallow whole.   atorvastatin 10 MG tablet Commonly known as: LIPITOR Take 10 mg by mouth daily.   Biotin 10 MG Tabs Take by mouth daily.   calcium carbonate 1500 (600 Ca) MG Tabs tablet Commonly known as: OSCAL Take 600 mg of elemental calcium by mouth 2 (two) times daily with a meal.   diltiazem 180 MG 24 hr capsule Commonly known as: CARDIZEM CD Take 1 capsule (180 mg total) by mouth daily.   docusate sodium 100 MG capsule Commonly known as: COLACE Take 100 mg by mouth 2 (two) times daily.   EPINEPHrine 0.3 mg/0.3 mL Soaj injection Commonly known as: EPI-PEN Inject 0.3 mLs (0.3 mg total) into the muscle once as needed (anaphylaxis/allergic reaction).   lactose free nutrition Liqd Take 237 mLs by mouth. Once a day at breakfast   LORazepam 0.5 MG tablet Commonly known as: ATIVAN Take 0.5 mg by mouth every 6 (six) hours as needed for anxiety.   meclizine 12.5 MG tablet Commonly known as: ANTIVERT Take 12.5 mg by mouth daily as needed for dizziness.   melatonin 3 MG Tabs tablet Take 3 mg by mouth at bedtime as needed (sleep).   memantine 10 MG tablet Commonly known as: NAMENDA Take 10 mg by mouth 2 (two) times daily.   polyethylene glycol 17 g packet Commonly known as: MIRALAX / GLYCOLAX Take 17 g by mouth every other day.   Therems-M Tabs Take 1 tablet by mouth daily.   PreserVision AREDS Tabs Take 2 tablets by mouth daily.   Vitamin D3 50 MCG (2000 UT) Tabs Take 2,000 Units by mouth daily.   Zoloft 50 MG tablet Generic drug:  sertraline Take 50 mg by mouth daily.       Review of Systems  Constitutional: Positive for activity change. Negative for appetite change and fever.       Pacing, restlessness  HENT: Positive for hearing loss. Negative for congestion and voice change.        Hears better left ear  Respiratory: Negative for cough and shortness of breath.   Cardiovascular: Negative for leg swelling.  Gastrointestinal: Negative for abdominal pain and constipation.  Genitourinary: Negative for dysuria and urgency.  Musculoskeletal: Positive for gait problem.  Skin: Negative for color change.  Neurological: Negative for speech difficulty, weakness and light-headedness.       Memory lapses. Chronic on and off dizziness  Psychiatric/Behavioral: Positive for agitation, behavioral problems, confusion and sleep disturbance. Negative for hallucinations. The patient is nervous/anxious and is hyperactive.        Relapsed restlessness, agitation, pacing, wandering, confusion, pressured speech    Immunization History  Administered Date(s) Administered  . Influenza Whole 08/25/2018  . Influenza, High Dose Seasonal PF 08/24/2019  . Influenza-Unspecified 09/07/2016, 09/12/2017, 09/02/2020  . Moderna Sars-Covid-2 Vaccination 11/23/2019, 12/21/2019, 09/29/2020  . PPD Test 02/18/2016  . Pneumococcal Conjugate-13 09/18/2017  . Pneumococcal-Unspecified 10/22/2018  . Tdap 10/07/2012   Pertinent  Health Maintenance Due  Topic Date Due  . INFLUENZA VACCINE  06/21/2021  . DEXA SCAN  Completed  . PNA vac Low Risk Adult  Completed   Fall Risk  08/17/2018 08/03/2017 10/24/2014  Falls in the past year? No No Yes  Number falls in past yr: - - 1  Injury with Fall? - - Yes  Risk Factor Category  - - High Fall Risk  Risk for fall due to : - - History of fall(s)   Functional Status Survey:    Vitals:   04/08/21 1628  BP: (!) 142/72  Pulse: 80  Resp: 16  Temp: (!) 97.5 F (36.4 C)  SpO2: 95%  Weight: 126 lb 6.4 oz  (57.3 kg)  Height: $Remove'5\' 1"'nCpcsrM$  (1.549 m)   Body mass index is 23.88 kg/m. Physical Exam Vitals and nursing note reviewed.  Constitutional:      Comments: Worried facial looks.   HENT:     Head: Normocephalic and atraumatic.     Mouth/Throat:     Mouth: Mucous membranes are moist.  Eyes:     Extraocular Movements: Extraocular movements intact.     Conjunctiva/sclera: Conjunctivae normal.     Pupils: Pupils are equal, round, and reactive to light.  Cardiovascular:     Rate and Rhythm: Normal rate and regular rhythm.     Heart sounds: No murmur heard.   Pulmonary:     Effort: Pulmonary effort is normal.     Breath sounds: No rales.  Abdominal:     General: Bowel sounds are normal.     Palpations: Abdomen is soft.     Tenderness: There is no abdominal tenderness.  Musculoskeletal:     Cervical back: Normal range of motion and neck supple.     Right lower leg: No edema.     Left lower leg: No edema.  Skin:    General: Skin is warm and dry.  Neurological:     General: No focal deficit present.     Mental Status: She is alert. Mental status is at baseline.     Gait: Gait abnormal.     Comments: Oriented to person, place.   Psychiatric:     Comments: Worried and confused facial looks.      Labs reviewed: Recent Labs    06/03/20 0952 09/11/20 0000 11/24/20 0000 03/19/21 1221  NA 131* 135* 135* 138  K 3.7 4.3 4.2 3.9  CL 99 102 103 104  CO2 21* 26* 24* 26*  GLUCOSE 107*  --   --   --   BUN $Re'13 15 21 'yuT$ 25*  CREATININE 0.59 0.8 0.7 0.8  CALCIUM 9.8 10.0 9.5 9.9   Recent Labs    06/03/20 0952 09/11/20 0000 11/24/20 0000 03/19/21 1221  AST $Re'26 19 19 21  'mIH$ ALT $R'19 14 14 15  'He$ ALKPHOS 51 52 57 64  BILITOT 0.9  --   --   --   PROT 7.1  --   --   --   ALBUMIN 4.1 3.9 3.9 4.0   Recent Labs    06/03/20 1022 09/11/20 0000 11/24/20 0000 03/19/21 1221  WBC 6.5 6.2 6.4 6.9  NEUTROABS 5.3 4,365.00 4,518.00 5,099.00  HGB 13.2 12.8 12.9 13.0  HCT 39.2 37 38 38  MCV 94.2  --    --   --   PLT 266 281 292 251   Lab Results  Component Value Date   TSH 2.703 06/03/2020   Lab Results  Component Value Date   HGBA1C 5.4 03/14/2018   Lab Results  Component Value Date   CHOL 178 01/07/2021   HDL 65 01/07/2021   LDLCALC 88 01/07/2021   TRIG 145 01/07/2021   CHOLHDL 2.6 03/14/2018    Significant Diagnostic Results in last 30 days:  No results found.  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   Anxiety, initially stabilizing since Sertraline 03/17/21  Will increase Sertraline to 50mg  qd, prn Lorazepam 0.5mg  q6hr x 14 days, update CBC/diff, CMP/eGFR, may consider Depakote if no better.    PAF (paroxysmal atrial fibrillation) (HCC) heart rate is in control, takes Diltiazem,ASA, Hgb 13 03/18/21   [facility] dementia [facility]) [facility], [facility], on Memantine 10mg  bid for memory.   Essential hypertension blood pressure is controlled, decreasedDiltiazem 120mg  03/29/21, ASA 81mg  qd.Bun/creat 25/0.78 03/18/21   Hyperlipidemia takes Atorvastatin 10mg  qd.ZOX09 01/07/21  Osteoporosis without current pathological fracture takes Ca, Vit D, has been Reclast before.   Chronic constipation  takes Colace bid, MiraLax qod.   Hyponatremia Na 138 03/18/21   Benign paroxysmal positional vertigo takes prn Meclizine.     Family/ staff Communication: plan of care reviewed with the patient and charge nurse.   Labs/tests ordered:  CBC/diff, CMP/eGFR   Time spend 35 minutes.

## 2021-04-08 NOTE — Assessment & Plan Note (Signed)
takes Atorvastatin 10mg qd. LDL 88 01/07/21 °

## 2021-04-09 ENCOUNTER — Encounter: Payer: Self-pay | Admitting: Nurse Practitioner

## 2021-04-09 DIAGNOSIS — N179 Acute kidney failure, unspecified: Secondary | ICD-10-CM | POA: Diagnosis not present

## 2021-04-09 DIAGNOSIS — I10 Essential (primary) hypertension: Secondary | ICD-10-CM | POA: Diagnosis not present

## 2021-04-09 LAB — BASIC METABOLIC PANEL
BUN: 20 (ref 4–21)
CO2: 22 (ref 13–22)
Chloride: 105 (ref 99–108)
Creatinine: 0.7 (ref 0.5–1.1)
Glucose: 88
Potassium: 4.2 (ref 3.4–5.3)
Sodium: 134 — AB (ref 137–147)

## 2021-04-09 LAB — COMPREHENSIVE METABOLIC PANEL
Albumin: 3.5 (ref 3.5–5.0)
Calcium: 9.3 (ref 8.7–10.7)
GFR calc Af Amer: 80
GFR calc non Af Amer: 69
Globulin: 2.5

## 2021-04-09 LAB — HEPATIC FUNCTION PANEL
ALT: 18 (ref 7–35)
AST: 30 (ref 13–35)
Alkaline Phosphatase: 62 (ref 25–125)
Bilirubin, Total: 0.6

## 2021-04-09 LAB — CBC AND DIFFERENTIAL
HCT: 35 — AB (ref 36–46)
Hemoglobin: 11.7 — AB (ref 12.0–16.0)
Neutrophils Absolute: 3730
Platelets: 237 (ref 150–399)
WBC: 5.6

## 2021-04-09 LAB — CBC: RBC: 3.74 — AB (ref 3.87–5.11)

## 2021-04-20 DIAGNOSIS — F0391 Unspecified dementia with behavioral disturbance: Secondary | ICD-10-CM | POA: Diagnosis not present

## 2021-04-20 DIAGNOSIS — H903 Sensorineural hearing loss, bilateral: Secondary | ICD-10-CM | POA: Diagnosis not present

## 2021-04-20 DIAGNOSIS — Z822 Family history of deafness and hearing loss: Secondary | ICD-10-CM | POA: Diagnosis not present

## 2021-04-26 ENCOUNTER — Other Ambulatory Visit: Payer: Self-pay

## 2021-04-26 ENCOUNTER — Ambulatory Visit (INDEPENDENT_AMBULATORY_CARE_PROVIDER_SITE_OTHER): Payer: Medicare PPO | Admitting: Ophthalmology

## 2021-04-26 ENCOUNTER — Encounter (INDEPENDENT_AMBULATORY_CARE_PROVIDER_SITE_OTHER): Payer: Self-pay | Admitting: Ophthalmology

## 2021-04-26 DIAGNOSIS — H43811 Vitreous degeneration, right eye: Secondary | ICD-10-CM | POA: Diagnosis not present

## 2021-04-26 DIAGNOSIS — H353211 Exudative age-related macular degeneration, right eye, with active choroidal neovascularization: Secondary | ICD-10-CM | POA: Diagnosis not present

## 2021-04-26 DIAGNOSIS — H353222 Exudative age-related macular degeneration, left eye, with inactive choroidal neovascularization: Secondary | ICD-10-CM | POA: Diagnosis not present

## 2021-04-26 DIAGNOSIS — H35351 Cystoid macular degeneration, right eye: Secondary | ICD-10-CM

## 2021-04-26 MED ORDER — BEVACIZUMAB 2.5 MG/0.1ML IZ SOSY
2.5000 mg | PREFILLED_SYRINGE | INTRAVITREAL | Status: AC | PRN
  Administered 2021-04-26: 2.5 mg via INTRAVITREAL

## 2021-04-26 NOTE — Assessment & Plan Note (Signed)
Component of CNVM OD °

## 2021-04-26 NOTE — Assessment & Plan Note (Signed)
No interval change we will continue to observe OS

## 2021-04-26 NOTE — Assessment & Plan Note (Signed)

## 2021-04-26 NOTE — Progress Notes (Signed)
04/26/2021     CHIEF COMPLAINT Patient presents for Retina Follow Up (8 Wk F/U OU, poss Avastin OD//Pt denies noticeable changes to [place] OU since last visit. Pt denies ocular pain, flashes of light, or floaters OU. //)   HISTORY OF PRESENT ILLNESS: Candice Willis is a 87 y.o. female who presents to the clinic today for:   HPI    Retina Follow Up    Diagnosis: Wet AMD   Laterality: right eye   Onset: 8 weeks ago   Severity: moderate   Duration: 8 weeks   Course: stable   Comments: 8 Wk F/U OU, poss Avastin OD  Pt denies noticeable changes to [place] OU since last visit. Pt denies ocular pain, flashes of light, or floaters OU.          Last edited by Ileana Roup, COA on 04/26/2021  9:30 AM. (History)      Referring physician: Mast, Man X, NP [address] [place],  [place] [address]  HISTORICAL INFORMATION:   Selected notes from the MEDICAL RECORD NUMBER    Lab Results  Component Value Date   HGBA1C 5.4 03/14/2018     CURRENT MEDICATIONS: No current outpatient medications on file. (Ophthalmic Drugs)   No current facility-administered medications for this visit. (Ophthalmic Drugs)   Current Outpatient Medications (Other)  Medication Sig  . aspirin EC 81 MG tablet Take 81 mg by mouth daily. Swallow whole.  Marland Kitchen atorvastatin (LIPITOR) 10 MG tablet Take 10 mg by mouth daily.  . Biotin 10 MG TABS Take by mouth daily.  . calcium carbonate (OSCAL) 1500 (600 Ca) MG TABS tablet Take 600 mg of elemental calcium by mouth 2 (two) times daily with a meal.  . Cholecalciferol (VITAMIN D3) 2000 units TABS Take 2,000 Units by mouth daily.  Marland Kitchen diltiazem (CARDIZEM CD) 180 MG 24 hr capsule Take 1 capsule (180 mg total) by mouth daily.  Marland Kitchen docusate sodium (COLACE) 100 MG capsule Take 100 mg by mouth 2 (two) times daily.   Marland Kitchen EPINEPHrine 0.3 mg/0.3 mL IJ SOAJ injection Inject 0.3 mLs (0.3 mg total) into the muscle once as needed (anaphylaxis/allergic reaction).  . lactose free nutrition  (BOOST) LIQD Take 237 mLs by mouth. Once a day at breakfast  . meclizine (ANTIVERT) 12.5 MG tablet Take 12.5 mg by mouth daily as needed for dizziness.   . Melatonin 3 MG TABS Take 3 mg by mouth at bedtime as needed (sleep).  . memantine (NAMENDA) 10 MG tablet Take 10 mg by mouth 2 (two) times daily.   . Multiple Vitamins-Minerals (PRESERVISION AREDS) TABS Take 2 tablets by mouth daily.  . Multiple Vitamins-Minerals (THEREMS-M) TABS Take 1 tablet by mouth daily.  . polyethylene glycol (MIRALAX / GLYCOLAX) packet Take 17 g by mouth every other day.  . sertraline (ZOLOFT) 50 MG tablet Take 50 mg by mouth daily.   No current facility-administered medications for this visit. (Other)      REVIEW OF SYSTEMS:    ALLERGIES No Known Allergies  PAST MEDICAL HISTORY Past Medical History:  Diagnosis Date  . Arteriosclerotic cardiovascular disease   . Bee sting reaction 08/23/2016   Tongue swelling and difficulty swallowing /notes 08/23/2016  . Constipation   . Degenerative joint disease of right hip 10/15/14  . Fracture of multiple pubic rami (HCC) 10/15/14   Right inferior and superior pubic rami  . HOH (hard of hearing)    Wears bilateral hearing aids  . Hyperlipidemia   . Hypertension   .  Macular degeneration   . Memory deficit   . Osteoporosis   . Pain in right foot   . Palpitations   . Paroxysmal atrial fibrillation (HCC)   . Thyroid nodule    Status post surgery  . Ulcer of hard palate   . Vertigo    Past Surgical History:  Procedure Laterality Date  . ABDOMINAL HYSTERECTOMY  1968  . APPENDECTOMY  1940's  . COLONOSCOPY    . FOOT SURGERY Left   . HARDWARE REMOVAL Right 02/16/2016   Procedure: HARDWARE REMOVAL RT HIP;  Surgeon: Sheral Apley, MD;  Location: [facility] OR;  Service: Orthopedics;  Laterality: Right;  . HIP ARTHROPLASTY Right 02/16/2016   Procedure: ARTHROPLASTY BIPOLAR HIP (HEMIARTHROPLASTY);  Surgeon: Sheral Apley, MD;  Location: [facility] OR;  Service:  Orthopedics;  Laterality: Right;  . INTRAMEDULLARY (IM) NAIL INTERTROCHANTERIC Right 09/11/2015   Procedure: INTRAMEDULLARY (IM) NAIL INTERTROCHANTRIC;  Surgeon: Sheral Apley, MD;  Location: [facility] OR;  Service: Orthopedics;  Laterality: Right;  . KNEE SURGERY      FAMILY HISTORY Family History  Problem Relation Age of Onset  . Heart disease Father   . Cancer Father        prostate  . Cancer Maternal Grandmother        colon  . Heart disease Paternal Grandmother   . Hyperlipidemia Sister   . Cancer Sister        breast  . Heart disease Brother   . Hyperlipidemia Brother   . Cancer Brother        prostate    SOCIAL HISTORY Social History   Tobacco Use  . Smoking status: Former Smoker    Packs/day: 0.50    Types: Cigarettes    Quit date: 10/05/1971    Years since quitting: 49.5  . Smokeless tobacco: Never Used  Vaping Use  . Vaping Use: Never used  Substance Use Topics  . Alcohol use: Yes    Comment: 1-4 per week; eats 10 raisins daily soaked in Gin  . Drug use: No         OPHTHALMIC EXAM: Base Eye Exam    Visual Acuity (ETDRS)      Right Left   Dist cc 20/100 -2 20/50 +2   Dist ph cc NI NI   Correction: Glasses       Tonometry (Tonopen, 9:34 AM)      Right Left   Pressure 14 15       Pupils      Dark Light Shape React APD   Right 4 4 Round Minimal None   Left 3 3 Round Minimal None       Visual Fields (Counting fingers)      Left Right    Full Full       Extraocular Movement      Right Left    Full Full       Neuro/Psych   Pt seems confused today       Dilation    Both eyes: 1.0% Mydriacyl, 2.5% Phenylephrine @ 9:34 AM        Slit Lamp and Fundus Exam    External Exam      Right Left   External Normal Normal       Slit Lamp Exam      Right Left   Lids/Lashes Normal Normal   Conjunctiva/Sclera White and quiet White and quiet   Cornea Clear Clear   Anterior Chamber Deep and quiet Deep and quiet  Iris Round and reactive  Round and reactive   Lens Centered posterior chamber intraocular lens Centered posterior chamber intraocular lens   Anterior Vitreous Normal Normal       Fundus Exam      Right Left   Posterior Vitreous Central vitreous floaters, Posterior vitreous detachment    Disc Normal    C/D Ratio 0.1    Macula Advanced age related macular degeneration, Drusen, no exudates, Macular thickening, Subretinal neovascular membrane, Geographic atrophy, Cystoid macular edema    Vessels Normal    Periphery Normal           IMAGING AND PROCEDURES  Imaging and Procedures for 04/26/21  OCT, Retina - OU - Both Eyes       Right Eye Quality was good. Scan locations included subfoveal. Central Foveal Thickness: 543. Progression has worsened. Findings include retinal drusen , choroidal neovascular membrane, cystoid macular edema, abnormal foveal contour, outer retinal atrophy, central retinal atrophy.   Left Eye Quality was good. Scan locations included subfoveal. Central Foveal Thickness: 321. Progression has been stable. Findings include abnormal foveal contour, subretinal fluid, pigment epithelial detachment.   Notes OD with chronically active CNVM, CME are stable centrally  at the 8-week interval today repeat intravitreal Avastin today.  OD stabilized at 7-week follow-up interval, will repeat again examination in 7 to 8 weeks OS with chronic pigment epithelial detachment serous sub-RPE detachment no change for years,  observe       Intravitreal Injection, Pharmacologic Agent - OD - Right Eye       Time Out 04/26/2021. 10:21 AM. Confirmed correct patient, procedure, site, and patient consented.   Anesthesia Topical anesthesia was used. Anesthetic medications included Akten 3.5%.   Procedure Preparation included Tobramycin 0.3%, 10% betadine to eyelids, 5% betadine to ocular surface, Ofloxacin . A supplied needle was used.   Injection:  2.5 mg Bevacizumab (AVASTIN) 2.5mg /0.27mL SOSY   NDC:  07371-062-69, Lot: [id number]   Route: Intravitreal, Site: Right Eye  Post-op Post injection exam found visual acuity of at least counting fingers. The patient tolerated the procedure well. There were no complications. The patient received written and verbal post procedure care education. Post injection medications were not given.                 ASSESSMENT/PLAN:  Exudative age-related macular degeneration of left eye with inactive choroidal neovascularization (HCC) No interval change we will continue to observe OS  Exudative age-related macular degeneration of right eye with active choroidal neovascularization (HCC) At 8-week follow-up, the right eye with persistent subfoveal CME from active CNVM.  We will repeat injection today simply to maintain and prevent enlargement of scotoma OD  Posterior vitreous detachment, right eye  The nature of posterior vitreous detachment was discussed with the patient as well as its physiology, its age prevalence, and its possible implication regarding retinal breaks and detachment.  An informational brochure was offered to the patient.  All the patient's questions were answered.  The patient was asked to return if new or different flashes or floaters develops.   Patient was instructed to contact office immediately if any new changes were noticed. I explained to the patient that vitreous inside the eye is similar to jello inside a bowl. As the jello melts it can start to pull away from the bowl, similarly the vitreous throughout our lives can begin to pull away from the retina. That process is called a posterior vitreous detachment. In some cases, the vitreous can tug hard enough  on the retina to form a retinal tear. I discussed with the patient the signs and symptoms of a retinal detachment.  Do not rub the eye.    Cystoid macular edema of right eye Component of CNVM OD      ICD-10-CM   1. Exudative age-related macular degeneration of right eye with  active choroidal neovascularization (HCC)  H35.3211 OCT, Retina - OU - Both Eyes    Intravitreal Injection, Pharmacologic Agent - OD - Right Eye    bevacizumab (AVASTIN) SOSY 2.5 mg  2. Exudative age-related macular degeneration of left eye with inactive choroidal neovascularization (HCC)  H35.3222   3. Posterior vitreous detachment, right eye  H43.811   4. Cystoid macular edema of right eye  H35.351     1.  Chronic active CNVM OD with CME.  Stabilized and not progressive on current interval therapy.  We will repeat injection OD today to maintain and prevent scotoma enlargement  2.  Left eye with subfoveal pigment epithelial detachment chronic now for years without therapy and on no therapy no change with preservation of good acuity will continue to observe left eye  3.  Ophthalmic Meds Ordered this visit:  Meds ordered this encounter  Medications  . bevacizumab (AVASTIN) SOSY 2.5 mg       Return in about 8 weeks (around 06/21/2021) for dilate, OD, AVASTIN OCT.  There are no Patient Instructions on file for this visit.   Explained the diagnoses, plan, and follow up with the patient and they expressed understanding.  Patient expressed understanding of the importance of proper follow up care.   Alford HighlandGary A. Tamica Covell M.D. Diseases & Surgery of the Retina and Vitreous [facility] 04/26/21     Abbreviations: M myopia (nearsighted); A astigmatism; H hyperopia (farsighted); P presbyopia; Mrx spectacle prescription;  CTL contact lenses; OD right eye; OS left eye; OU both eyes  XT exotropia; ET esotropia; PEK punctate epithelial keratitis; PEE punctate epithelial erosions; DES dry eye syndrome; MGD meibomian gland dysfunction; ATs artificial tears; PFAT's preservative free artificial tears; NSC nuclear sclerotic cataract; PSC posterior subcapsular cataract; ERM epi-retinal membrane; PVD posterior vitreous detachment; RD retinal detachment; DM diabetes mellitus; DR diabetic  retinopathy; NPDR non-proliferative diabetic retinopathy; PDR proliferative diabetic retinopathy; CSME clinically significant macular edema; DME diabetic macular edema; dbh dot blot hemorrhages; CWS cotton wool spot; POAG primary open angle glaucoma; C/D cup-to-disc ratio; HVF humphrey visual field; GVF goldmann visual field; OCT optical coherence tomography; IOP intraocular pressure; BRVO Branch retinal vein occlusion; CRVO central retinal vein occlusion; CRAO central retinal artery occlusion; BRAO branch retinal artery occlusion; RT retinal tear; SB scleral buckle; PPV pars plana vitrectomy; VH Vitreous hemorrhage; PRP panretinal laser photocoagulation; IVK intravitreal kenalog; VMT vitreomacular traction; MH Macular hole;  NVD neovascularization of the disc; NVE neovascularization elsewhere; AREDS age related eye disease study; ARMD age related macular degeneration; POAG primary open angle glaucoma; EBMD epithelial/anterior basement membrane dystrophy; ACIOL anterior chamber intraocular lens; IOL intraocular lens; PCIOL posterior chamber intraocular lens; Phaco/IOL phacoemulsification with intraocular lens placement; PRK photorefractive keratectomy; LASIK laser assisted in situ keratomileusis; HTN hypertension; DM diabetes mellitus; COPD chronic obstructive pulmonary disease

## 2021-04-26 NOTE — Assessment & Plan Note (Signed)
At 8-week follow-up, the right eye with persistent subfoveal CME from active CNVM.  We will repeat injection today simply to maintain and prevent enlargement of scotoma OD

## 2021-04-30 ENCOUNTER — Non-Acute Institutional Stay (SKILLED_NURSING_FACILITY): Payer: Medicare PPO | Admitting: Nurse Practitioner

## 2021-04-30 ENCOUNTER — Encounter: Payer: Self-pay | Admitting: Nurse Practitioner

## 2021-04-30 DIAGNOSIS — E871 Hypo-osmolality and hyponatremia: Secondary | ICD-10-CM

## 2021-04-30 DIAGNOSIS — H8110 Benign paroxysmal vertigo, unspecified ear: Secondary | ICD-10-CM | POA: Diagnosis not present

## 2021-04-30 DIAGNOSIS — F0394 Unspecified dementia, unspecified severity, with anxiety: Secondary | ICD-10-CM

## 2021-04-30 DIAGNOSIS — F0391 Unspecified dementia with behavioral disturbance: Secondary | ICD-10-CM

## 2021-04-30 DIAGNOSIS — E782 Mixed hyperlipidemia: Secondary | ICD-10-CM

## 2021-04-30 DIAGNOSIS — F0150 Vascular dementia without behavioral disturbance: Secondary | ICD-10-CM | POA: Diagnosis not present

## 2021-04-30 DIAGNOSIS — I480 Paroxysmal atrial fibrillation: Secondary | ICD-10-CM

## 2021-04-30 DIAGNOSIS — M810 Age-related osteoporosis without current pathological fracture: Secondary | ICD-10-CM | POA: Diagnosis not present

## 2021-04-30 DIAGNOSIS — I10 Essential (primary) hypertension: Secondary | ICD-10-CM

## 2021-04-30 DIAGNOSIS — K5909 Other constipation: Secondary | ICD-10-CM

## 2021-04-30 NOTE — Progress Notes (Signed)
Location:   [facility] Room Number: [phone] Place of Service:  SNF (31) Provider:  Jakwon Gayton X Zyla Dascenzo, NP  Griffin Dewilde X, NP  Patient Care Team: Audrianna Driskill X, NP as PCP - General (Internal Medicine) Nahser, Deloris PingPhilip J, MD as PCP - Cardiology (Cardiology) Venita LickBrooks, Dahari, MD as Consulting Physician (Orthopedic Surgery) Marykay LexHarding, David W, MD as Consulting Physician (Cardiology) Justus Droke X, NP as Nurse Practitioner (Internal Medicine)  Extended Emergency Contact Information Primary Emergency Contact: [name],Tajai Address: [address]           [place], [place] [id number] [place] Home Phone: [phone] Mobile Phone: [phone] Relation: Niece Secondary Emergency Contact: [name],[name]  [place] Mobile Phone: [phone] Relation: Niece  Code Status:  DNR Goals of care: Advanced Directive information Advanced Directives 04/30/2021  Does Patient Have a Medical Advance Directive? Yes  Type of Estate agentAdvance Directive Healthcare Power of [place];Living will;Out of facility DNR (pink MOST or yellow form)  Does patient want to make changes to medical advance directive? No - Patient declined  Copy of Healthcare Power of Attorney in Chart? Yes - validated most recent copy scanned in chart (See row information)  Would patient like information on creating a medical advance directive? -  Pre-existing out of facility DNR order (yellow form or pink MOST form) Yellow form placed in chart (order not valid for inpatient use);Pink MOST form placed in chart (order not valid for inpatient use)     Chief Complaint  Patient presents with   Medical Management of Chronic Issues    Routine follow up.    Health Maintenance    Discuss need for shingles vaccine.     HPI:  Pt is a 87 y.o. female seen today for medical management of chronic diseases.          Anxiety, better, takes Sertraline, prn Lorazepam             AFib, heart rate is in control, takes  Diltiazem, ASA, Hgb 13 03/18/21             [facility] care assistance, on Memantine 10mg  bid for memory.             HTN, blood pressure is controlled, decreased Diltiazem 120mg  03/29/21, ASA 81mg  qd. Bun/creat 20/0.7 04/09/21             Hyperlipidemia, takes Atorvastatin 10mg  qd. LDL 88 01/07/21             OP, takes Ca, Vit D, has been Reclast before.              Constipation, takes Colace bid, MiraLax qod.              Hyponatremia, Na 134 04/09/21             BPPV, takes prn Meclizine Past Medical History:  Diagnosis Date   Arteriosclerotic cardiovascular disease    Bee sting reaction 08/23/2016   Tongue swelling and difficulty swallowing /notes 08/23/2016   Constipation    Degenerative joint disease of right hip 10/15/14   Fracture of multiple pubic rami (HCC) 10/15/14   Right inferior and superior pubic rami   HOH (hard of hearing)    Wears bilateral hearing aids   Hyperlipidemia    Hypertension    Macular degeneration    Memory deficit    Osteoporosis    Pain in right foot    Palpitations  Paroxysmal atrial fibrillation (HCC)    Thyroid nodule    Status post surgery   Ulcer of hard palate    Vertigo    Past Surgical History:  Procedure Laterality Date   ABDOMINAL HYSTERECTOMY  1968   APPENDECTOMY  1940's   COLONOSCOPY     FOOT SURGERY Left    HARDWARE REMOVAL Right 02/16/2016   Procedure: HARDWARE REMOVAL RT HIP;  Surgeon: Sheral Apley, MD;  Location: [facility] OR;  Service: Orthopedics;  Laterality: Right;   HIP ARTHROPLASTY Right 02/16/2016   Procedure: ARTHROPLASTY BIPOLAR HIP (HEMIARTHROPLASTY);  Surgeon: Sheral Apley, MD;  Location: [facility] OR;  Service: Orthopedics;  Laterality: Right;   INTRAMEDULLARY (IM) NAIL INTERTROCHANTERIC Right 09/11/2015   Procedure: INTRAMEDULLARY (IM) NAIL INTERTROCHANTRIC;  Surgeon: Sheral Apley, MD;  Location: [facility] OR;  Service: Orthopedics;  Laterality: Right;   KNEE SURGERY      No Known  Allergies  Allergies as of 04/30/2021   No Known Allergies      Medication List        Accurate as of April 30, 2021 11:59 PM. If you have any questions, ask your nurse or doctor.          STOP taking these medications    diltiazem 180 MG 24 hr capsule Commonly known as: CARDIZEM CD Stopped by: Tex Conroy X Ahmari Duerson, NP       TAKE these medications    aspirin EC 81 MG tablet Take 81 mg by mouth daily. Swallow whole.   atorvastatin 10 MG tablet Commonly known as: LIPITOR Take 10 mg by mouth daily.   Biotin 10 MG Tabs Take by mouth daily.   calcium carbonate 1500 (600 Ca) MG Tabs tablet Commonly known as: OSCAL Take 600 mg of elemental calcium by mouth 2 (two) times daily with a meal.   diltiazem 120 MG tablet Commonly known as: CARDIZEM Take 120 mg by mouth daily.   docusate sodium 100 MG capsule Commonly known as: COLACE Take 100 mg by mouth 2 (two) times daily.   EPINEPHrine 0.3 mg/0.3 mL Soaj injection Commonly known as: EPI-PEN Inject 0.3 mLs (0.3 mg total) into the muscle once as needed (anaphylaxis/allergic reaction).   lactose free nutrition Liqd Take 237 mLs by mouth. Once a day at breakfast   LORazepam 0.5 MG tablet Commonly known as: ATIVAN Take 0.5 mg by mouth every 6 (six) hours as needed for anxiety.   meclizine 12.5 MG tablet Commonly known as: ANTIVERT Take 12.5 mg by mouth daily as needed for dizziness.   melatonin 3 MG Tabs tablet Take 3 mg by mouth at bedtime as needed (sleep).   memantine 10 MG tablet Commonly known as: NAMENDA Take 10 mg by mouth 2 (two) times daily.   polyethylene glycol 17 g packet Commonly known as: MIRALAX / GLYCOLAX Take 17 g by mouth every other day.   sertraline 50 MG tablet Commonly known as: ZOLOFT Take 50 mg by mouth daily.   Therems-M Tabs Take 1 tablet by mouth daily.   PreserVision AREDS Tabs Take 2 tablets by mouth daily.   Vitamin D3 50 MCG (2000 UT) Tabs Take 2,000 Units by mouth daily.         Review of Systems  Constitutional:  Negative for fatigue, fever and unexpected weight change.       Pacing, restlessness  HENT:  Positive for hearing loss. Negative for congestion and voice change.        Hears better left ear  Respiratory:  Negative for shortness of breath.   Cardiovascular:  Negative for leg swelling.  Gastrointestinal:  Negative for abdominal pain and constipation.  Genitourinary:  Negative for dysuria and urgency.  Musculoskeletal:  Positive for gait problem.  Skin:  Negative for color change.  Neurological:  Negative for speech difficulty, weakness and headaches.       Memory lapses. Chronic on and off dizziness  Psychiatric/Behavioral:  Positive for agitation, behavioral problems, confusion and sleep disturbance. Negative for hallucinations. The patient is nervous/anxious and is hyperactive.        Improving   Immunization History  Administered Date(s) Administered   Influenza Whole 08/25/2018   Influenza, High Dose Seasonal PF 08/24/2019   Influenza-Unspecified 09/07/2016, 09/12/2017, 09/02/2020   Moderna Sars-Covid-2 Vaccination 11/23/2019, 12/21/2019, 09/29/2020, 04/20/2021   PPD Test 02/18/2016   Pneumococcal Conjugate-13 09/18/2017   Pneumococcal-Unspecified 10/22/2018   Tdap 10/07/2012   Pertinent  Health Maintenance Due  Topic Date Due   INFLUENZA VACCINE  06/21/2021   DEXA SCAN  Completed   PNA vac Low Risk Adult  Completed   Fall Risk  08/17/2018 08/03/2017 10/24/2014  Falls in the past year? No No Yes  Number falls in past yr: - - 1  Injury with Fall? - - Yes  Risk Factor Category  - - High Fall Risk  Risk for fall due to : - - History of fall(s)   Functional Status Survey:    Vitals:   04/30/21 0837  BP: 135/80  Pulse: 75  Resp: 18  Temp: (!) 97.1 F (36.2 C)  SpO2: 96%  Weight: 122 lb 12.8 oz (55.7 kg)  Height:  (1.549 m)   Body mass index is 23.2 kg/m. Physical Exam Vitals and nursing note reviewed.   Constitutional:      Comments: Worried facial looks.   HENT:     Head: Normocephalic and atraumatic.     Mouth/Throat:     Mouth: Mucous membranes are moist.  Eyes:     Extraocular Movements: Extraocular movements intact.     Conjunctiva/sclera: Conjunctivae normal.     Pupils: Pupils are equal, round, and reactive to light.  Cardiovascular:     Rate and Rhythm: Normal rate and regular rhythm.     Heart sounds: No murmur heard. Pulmonary:     Effort: Pulmonary effort is normal.     Breath sounds: No rales.  Abdominal:     General: Bowel sounds are normal.     Palpations: Abdomen is soft.     Tenderness: There is no abdominal tenderness.  Musculoskeletal:     Cervical back: Normal range of motion and neck supple.     Right lower leg: No edema.     Left lower leg: No edema.  Skin:    General: Skin is warm and dry.  Neurological:     General: No focal deficit present.     Mental Status: She is alert. Mental status is at baseline.     Gait: Gait abnormal.     Comments: Oriented to person  Psychiatric:     Comments: Worried and confused facial looks.     Labs reviewed: Recent Labs    06/03/20 0952 09/11/20 0000 11/24/20 0000 03/19/21 1221 04/09/21 0000  NA 131*   < > 135* 138 134*  K 3.7   < > 4.2 3.9 4.2  CL 99   < > 103 104 105  CO2 21*   < > 24* 26* 22  GLUCOSE 107*  --   --   --   --  BUN 13   < > 21 25* 20  CREATININE 0.59   < > 0.7 0.8 0.7  CALCIUM 9.8   < > 9.5 9.9 9.3   < > = values in this interval not displayed.   Recent Labs    06/03/20 0952 09/11/20 0000 11/24/20 0000 03/19/21 1221 04/09/21 0000  AST 26   < > 19 21 30   ALT 19   < > 14 15 18   ALKPHOS 51   < > 57 64 62  BILITOT 0.9  --   --   --   --   PROT 7.1  --   --   --   --   ALBUMIN 4.1   < > 3.9 4.0 3.5   < > = values in this interval not displayed.   Recent Labs    06/03/20 1022 09/11/20 0000 11/24/20 0000 03/19/21 1221 04/09/21 0000  WBC 6.5   < > 6.4 6.9 5.6  NEUTROABS 5.3    < > 4,518.00 5,099.00 3,730.00  HGB 13.2   < > 12.9 13.0 11.7*  HCT 39.2   < > 38 38 35*  MCV 94.2  --   --   --   --   PLT 266   < > 292 251 237   < > = values in this interval not displayed.   Lab Results  Component Value Date   TSH 2.703 06/03/2020   Lab Results  Component Value Date   HGBA1C 5.4 03/14/2018   Lab Results  Component Value Date   CHOL 178 01/07/2021   HDL 65 01/07/2021   LDLCALC 88 01/07/2021   TRIG 145 01/07/2021   CHOLHDL 2.6 03/14/2018    Significant Diagnostic Results in last 30 days:  Intravitreal Injection, Pharmacologic Agent - OD - Right Eye  Result Date: 04/26/2021 Time Out 04/26/2021. 10:21 AM. Confirmed correct patient, procedure, site, and patient consented. Anesthesia Topical anesthesia was used. Anesthetic medications included Akten 3.5%. Procedure Preparation included Tobramycin 0.3%, 10% betadine to eyelids, 5% betadine to ocular surface, Ofloxacin . A supplied needle was used. Injection: 2.5 mg Bevacizumab (AVASTIN) 2.5mg /0.64mL SOSY   NDC: 06/26/2021, Lot0m   Route: Intravitreal, Site: Right Eye Post-op Post injection exam found visual acuity of at least counting fingers. The patient tolerated the procedure well. There were no complications. The patient received written and verbal post procedure care education. Post injection medications were not given.   OCT, Retina - OU - Both Eyes  Result Date: 04/26/2021 Right Eye Quality was good. Scan locations included subfoveal. Central Foveal Thickness: 543. Progression has worsened. Findings include retinal drusen , choroidal neovascular membrane, cystoid macular edema, abnormal foveal contour, outer retinal atrophy, central retinal atrophy. Left Eye Quality was good. Scan locations included subfoveal. Central Foveal Thickness: 321. Progression has been stable. Findings include abnormal foveal contour, subretinal fluid, pigment epithelial detachment. Notes OD with chronically active CNVM, CME are  stable centrally  at the 8-week interval today repeat intravitreal Avastin today. 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   Osteoporosis without current pathological fracture takes Ca, Vit D, has been Reclast before.   Hyperlipidemia takes Atorvastatin 10mg  qd. LDL 88 01/07/21  Essential hypertension blood pressure is controlled,  decreased Diltiazem 120mg  03/29/21, ASA 81mg  qd. Bun/creat 20/0.7 04/09/21  Vascular dementia [facility]) The patient resides in SNF  [facility] for safety, care assistance, on Memantine 10mg  bid for memory.  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   Labs/tests ordered:  none  Time spend 35 minutes.

## 2021-05-03 ENCOUNTER — Encounter: Payer: Self-pay | Admitting: Nurse Practitioner

## 2021-05-03 NOTE — Assessment & Plan Note (Signed)
takes Ca, Vit D, has been Reclast before.  °

## 2021-05-03 NOTE — Assessment & Plan Note (Signed)
Na 134 04/09/21 

## 2021-05-03 NOTE — Assessment & Plan Note (Signed)
heart rate is in control, takes Diltiazem, ASA, Hgb 13 03/18/21

## 2021-05-03 NOTE — Assessment & Plan Note (Signed)
takes Atorvastatin 10mg qd. LDL 88 01/07/21 °

## 2021-05-03 NOTE — Assessment & Plan Note (Signed)
The patient resides in SNF  [facility] for safety, care assistance, on Memantine 10mg  bid for memory.

## 2021-05-03 NOTE — Assessment & Plan Note (Signed)
Stable, takes Colace bid, MiraLax qod.  

## 2021-05-03 NOTE — Assessment & Plan Note (Signed)
takes prn Meclizine °

## 2021-05-03 NOTE — Assessment & Plan Note (Signed)
heart rate is in control, takes Diltiazem, ASA, Hgb 13 03/18/21 

## 2021-05-03 NOTE — Assessment & Plan Note (Signed)
blood pressure is controlled, decreased Diltiazem 120mg  03/29/21, ASA 81mg  qd. Bun/creat 20/0.7 04/09/21

## 2021-05-04 DIAGNOSIS — L602 Onychogryphosis: Secondary | ICD-10-CM | POA: Diagnosis not present

## 2021-05-04 DIAGNOSIS — M79671 Pain in right foot: Secondary | ICD-10-CM | POA: Diagnosis not present

## 2021-05-04 DIAGNOSIS — M79672 Pain in left foot: Secondary | ICD-10-CM | POA: Diagnosis not present

## 2021-05-10 ENCOUNTER — Non-Acute Institutional Stay (SKILLED_NURSING_FACILITY): Payer: Medicare PPO | Admitting: Nurse Practitioner

## 2021-05-10 ENCOUNTER — Encounter: Payer: Self-pay | Admitting: Nurse Practitioner

## 2021-05-10 DIAGNOSIS — F0394 Unspecified dementia, unspecified severity, with anxiety: Secondary | ICD-10-CM

## 2021-05-10 DIAGNOSIS — M810 Age-related osteoporosis without current pathological fracture: Secondary | ICD-10-CM | POA: Diagnosis not present

## 2021-05-10 DIAGNOSIS — F0391 Unspecified dementia with behavioral disturbance: Secondary | ICD-10-CM

## 2021-05-10 DIAGNOSIS — E871 Hypo-osmolality and hyponatremia: Secondary | ICD-10-CM

## 2021-05-10 DIAGNOSIS — F0150 Vascular dementia without behavioral disturbance: Secondary | ICD-10-CM | POA: Diagnosis not present

## 2021-05-10 DIAGNOSIS — W19XXXA Unspecified fall, initial encounter: Secondary | ICD-10-CM | POA: Diagnosis not present

## 2021-05-10 DIAGNOSIS — I10 Essential (primary) hypertension: Secondary | ICD-10-CM | POA: Diagnosis not present

## 2021-05-10 DIAGNOSIS — E782 Mixed hyperlipidemia: Secondary | ICD-10-CM

## 2021-05-10 DIAGNOSIS — H8110 Benign paroxysmal vertigo, unspecified ear: Secondary | ICD-10-CM

## 2021-05-10 DIAGNOSIS — I480 Paroxysmal atrial fibrillation: Secondary | ICD-10-CM | POA: Diagnosis not present

## 2021-05-10 DIAGNOSIS — K5909 Other constipation: Secondary | ICD-10-CM

## 2021-05-10 NOTE — Progress Notes (Signed)
Location:   SNF [facility] Room Number: 25 Place of Service:  SNF (31) Provider: Arna Snipe Auria Mckinlay NP  Montrail Mehrer X, NP  Patient Care Team: Eliani Leclere X, NP as PCP - General (Internal Medicine) Nahser, Deloris Ping, MD as PCP - Cardiology (Cardiology) Venita Lick, MD as Consulting Physician (Orthopedic Surgery) Marykay Lex, MD as Consulting Physician (Cardiology) Elverna Caffee X, NP as Nurse Practitioner (Internal Medicine)  Extended Emergency Contact Information Primary Emergency Contact: [name],Elainah Address: [address]           [place], [place] [address] [place] Home Phone: [phone] Mobile Phone: [phone] Relation: Niece Secondary Emergency Contact: [name],[name]  [place] Mobile Phone: [phone] Relation: Niece  Code Status: DNR Goals of care: Advanced Directive information Advanced Directives 04/30/2021  Does Patient Have a Medical Advance Directive? Yes  Type of Estate agent of [place];Living will;Out of facility DNR (pink MOST or yellow form)  Does patient want to make changes to medical advance directive? No - Patient declined  Copy of Healthcare Power of Attorney in Chart? Yes - validated most recent copy scanned in chart (See row information)  Would patient like information on creating a medical advance directive? -  Pre-existing out of facility DNR order (yellow form or pink MOST form) Yellow form placed in chart (order not valid for inpatient use);Pink MOST form placed in chart (order not valid for inpatient use)     Chief Complaint  Patient presents with   Acute Visit    fall    HPI:  Pt is a 87 y.o. female seen today for an acute visit for fall 05/09/21 when the patient was found sitting on the floor in front of recliner, no apparent injury.    Anxiety, better, takes Sertraline, prn Lorazepam             AFib, heart rate is in control, takes Diltiazem, ASA, Hgb 13 03/18/21             The patient  resides in [facility] for safety, care assistance, on Memantine 10mg  bid for memory.             HTN, blood pressure is controlled, decreased Diltiazem 120mg  03/29/21, ASA 81mg  qd. Bun/creat 20/0.7 04/09/21             Hyperlipidemia, takes Atorvastatin 10mg  qd. LDL 88 01/07/21             OP, takes Ca, Vit D, has been Reclast before.              Constipation, takes Colace bid, MiraLax qod.              Hyponatremia, Na 134 04/09/21             BPPV, takes prn Meclizine Past Medical History:  Diagnosis Date   Arteriosclerotic cardiovascular disease    Bee sting reaction 08/23/2016   Tongue swelling and difficulty swallowing /notes 08/23/2016   Constipation    Degenerative joint disease of right hip 10/15/14   Fracture of multiple pubic rami (HCC) 10/15/14   Right inferior and superior pubic rami   HOH (hard of hearing)    Wears bilateral hearing aids   Hyperlipidemia    Hypertension    Macular degeneration    Memory deficit    Osteoporosis    Pain in right foot    Palpitations    Paroxysmal atrial fibrillation (HCC)    Thyroid nodule  Status post surgery   Ulcer of hard palate    Vertigo    Past Surgical History:  Procedure Laterality Date   ABDOMINAL HYSTERECTOMY  1968   APPENDECTOMY  1940's   COLONOSCOPY     FOOT SURGERY Left    HARDWARE REMOVAL Right 02/16/2016   Procedure: HARDWARE REMOVAL RT HIP;  Surgeon: Sheral Apley, MD;  Location: [facility] OR;  Service: Orthopedics;  Laterality: Right;   HIP ARTHROPLASTY Right 02/16/2016   Procedure: ARTHROPLASTY BIPOLAR HIP (HEMIARTHROPLASTY);  Surgeon: Sheral Apley, MD;  Location: [facility] OR;  Service: Orthopedics;  Laterality: Right;   INTRAMEDULLARY (IM) NAIL INTERTROCHANTERIC Right 09/11/2015   Procedure: INTRAMEDULLARY (IM) NAIL INTERTROCHANTRIC;  Surgeon: Sheral Apley, MD;  Location: [facility] OR;  Service: Orthopedics;  Laterality: Right;   KNEE SURGERY      No Known Allergies  Allergies as of 05/10/2021   No Known Allergies       Medication List        Accurate as of May 10, 2021 11:59 PM. If you have any questions, ask your nurse or doctor.          aspirin EC 81 MG tablet Take 81 mg by mouth daily. Swallow whole.   atorvastatin 10 MG tablet Commonly known as: LIPITOR Take 10 mg by mouth daily.   Biotin 10 MG Tabs Take by mouth daily.   calcium carbonate 1500 (600 Ca) MG Tabs tablet Commonly known as: OSCAL Take 600 mg of elemental calcium by mouth 2 (two) times daily with a meal.   diltiazem 120 MG tablet Commonly known as: CARDIZEM Take 120 mg by mouth daily.   docusate sodium 100 MG capsule Commonly known as: COLACE Take 100 mg by mouth 2 (two) times daily.   EPINEPHrine 0.3 mg/0.3 mL Soaj injection Commonly known as: EPI-PEN Inject 0.3 mLs (0.3 mg total) into the muscle once as needed (anaphylaxis/allergic reaction).   lactose free nutrition Liqd Take 237 mLs by mouth. Once a day at breakfast   LORazepam 0.5 MG tablet Commonly known as: ATIVAN Take 0.5 mg by mouth every 6 (six) hours as needed for anxiety.   meclizine 12.5 MG tablet Commonly known as: ANTIVERT Take 12.5 mg by mouth daily as needed for dizziness.   melatonin 3 MG Tabs tablet Take 3 mg by mouth at bedtime as needed (sleep).   memantine 10 MG tablet Commonly known as: NAMENDA Take 10 mg by mouth 2 (two) times daily.   polyethylene glycol 17 g packet Commonly known as: MIRALAX / GLYCOLAX Take 17 g by mouth every other day.   sertraline 50 MG tablet Commonly known as: ZOLOFT Take 50 mg by mouth daily.   Therems-M Tabs Take 1 tablet by mouth daily.   PreserVision AREDS Tabs Take 2 tablets by mouth daily.   Vitamin D3 50 MCG (2000 UT) Tabs Take 2,000 Units by mouth daily.        Review of Systems  Constitutional:  Negative for activity change, appetite change and fever.       Pacing, restlessness  HENT:  Positive for hearing loss. Negative for congestion and voice change.        Hears better left  ear  Respiratory:  Negative for shortness of breath.   Cardiovascular:  Negative for leg swelling.  Gastrointestinal:  Negative for abdominal pain and constipation.  Genitourinary:  Negative for dysuria and urgency.  Musculoskeletal:  Positive for gait problem.  Skin:  Negative for color change.  Neurological:  Negative for speech difficulty, weakness and headaches.       Memory lapses. Chronic on and off dizziness  Psychiatric/Behavioral:  Positive for agitation, behavioral problems, confusion and sleep disturbance. Negative for hallucinations. The patient is nervous/anxious and is hyperactive.        Improving   Immunization History  Administered Date(s) Administered   Influenza Whole 08/25/2018   Influenza, High Dose Seasonal PF 08/24/2019   Influenza-Unspecified 09/07/2016, 09/12/2017, 09/02/2020   Moderna Sars-Covid-2 Vaccination 11/23/2019, 12/21/2019, 09/29/2020, 04/20/2021   PPD Test 02/18/2016   Pneumococcal Conjugate-13 09/18/2017   Pneumococcal-Unspecified 10/22/2018   Tdap 10/07/2012   Pertinent  Health Maintenance Due  Topic Date Due   INFLUENZA VACCINE  06/21/2021   DEXA SCAN  Completed   PNA vac Low Risk Adult  Completed   Fall Risk  08/17/2018 08/03/2017 10/24/2014  Falls in the past year? No No Yes  Number falls in past yr: - - 1  Injury with Fall? - - Yes  Risk Factor Category  - - High Fall Risk  Risk for fall due to : - - History of fall(s)   Functional Status Survey:    Vitals:   05/10/21 1644  BP: 109/63  Pulse: 74  Resp: 18  Temp: (!) 97.5 F (36.4 C)  SpO2: 94%   There is no height or weight on file to calculate BMI. Physical Exam Vitals and nursing note reviewed.  Constitutional:      Appearance: Normal appearance.  HENT:     Head: Normocephalic and atraumatic.     Mouth/Throat:     Mouth: Mucous membranes are moist.  Eyes:     Extraocular Movements: Extraocular movements intact.     Conjunctiva/sclera: Conjunctivae normal.     Pupils:  Pupils are equal, round, and reactive to light.  Cardiovascular:     Rate and Rhythm: Normal rate and regular rhythm.     Heart sounds: No murmur heard. Pulmonary:     Effort: Pulmonary effort is normal.     Breath sounds: No rales.  Abdominal:     General: Bowel sounds are normal.     Palpations: Abdomen is soft.     Tenderness: There is no abdominal tenderness.  Musculoskeletal:     Cervical back: Normal range of motion and neck supple.     Right lower leg: No edema.     Left lower leg: No edema.  Skin:    General: Skin is warm and dry.  Neurological:     General: No focal deficit present.     Mental Status: She is alert. Mental status is at baseline.     Gait: Gait abnormal.     Comments: Oriented to person  Psychiatric:     Comments: confused facial looks.     Labs reviewed: Recent Labs    06/03/20 0952 09/11/20 0000 11/24/20 0000 03/19/21 1221 04/09/21 0000  NA 131*   < > 135* 138 134*  K 3.7   < > 4.2 3.9 4.2  CL 99   < > 103 104 105  CO2 21*   < > 24* 26* 22  GLUCOSE 107*  --   --   --   --   BUN 13   < > 21 25* 20  CREATININE 0.59   < > 0.7 0.8 0.7  CALCIUM 9.8   < > 9.5 9.9 9.3   < > = values in this interval not displayed.   Recent Labs    06/03/20 0952 09/11/20  0000 11/24/20 0000 03/19/21 1221 04/09/21 0000  AST 26   < > 19 21 30   ALT 19   < > 14 15 18   ALKPHOS 51   < > 57 64 62  BILITOT 0.9  --   --   --   --   PROT 7.1  --   --   --   --   ALBUMIN 4.1   < > 3.9 4.0 3.5   < > = values in this interval not displayed.   Recent Labs    06/03/20 1022 09/11/20 0000 11/24/20 0000 03/19/21 1221 04/09/21 0000  WBC 6.5   < > 6.4 6.9 5.6  NEUTROABS 5.3   < > 4,518.00 5,099.00 3,730.00  HGB 13.2   < > 12.9 13.0 11.7*  HCT 39.2   < > 38 38 35*  MCV 94.2  --   --   --   --   PLT 266   < > 292 251 237   < > = values in this interval not displayed.   Lab Results  Component Value Date   TSH 2.703 06/03/2020   Lab Results  Component Value Date    HGBA1C 5.4 03/14/2018   Lab Results  Component Value Date   CHOL 178 01/07/2021   HDL 65 01/07/2021   LDLCALC 88 01/07/2021   TRIG 145 01/07/2021   CHOLHDL 2.6 03/14/2018    Significant Diagnostic Results in last 30 days:  Intravitreal Injection, Pharmacologic Agent - OD - Right Eye  Result Date: 04/26/2021 Time Out 04/26/2021. 10:21 AM. Confirmed correct patient, procedure, site, and patient consented. Anesthesia Topical anesthesia was used. Anesthetic medications included Akten 3.5%. Procedure Preparation included Tobramycin 0.3%, 10% betadine to eyelids, 5% betadine to ocular surface, Ofloxacin . A supplied needle was used. Injection: 2.5 mg Bevacizumab (AVASTIN) 2.5mg /0.32mL SOSY   NDC: 06/26/2021, Lot0m   Route: Intravitreal, Site: Right Eye Post-op Post injection exam found visual acuity of at least counting fingers. The patient tolerated the procedure well. There were no complications. The patient received written and verbal post procedure care education. Post injection medications were not given.   OCT, Retina - OU - Both Eyes  Result Date: 04/26/2021 Right Eye Quality was good. Scan locations included subfoveal. Central Foveal Thickness: 543. Progression has worsened. Findings include retinal drusen , choroidal neovascular membrane, cystoid macular edema, abnormal foveal contour, outer retinal atrophy, central retinal atrophy. Left Eye Quality was good. Scan locations included subfoveal. Central Foveal Thickness: 321. Progression has been stable. Findings include abnormal foveal contour, subretinal fluid, pigment epithelial detachment. Notes OD with chronically active CNVM, CME are stable centrally  at the 8-week interval today repeat intravitreal Avastin today. 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  Essential hypertension blood pressure is controlled, decreased Diltiazem 120mg  03/29/21, ASA 81mg  qd. Bun/creat 20/0.7 04/09/21  Hyperlipidemia takes Atorvastatin 10mg  qd. LDL 88 01/07/21  Osteoporosis without current pathological fracture takes Ca, Vit D, has been Reclast before.   Chronic constipation takes Colace  bid, MiraLax qod.   Hyponatremia Na 134 04/09/21  Benign paroxysmal positional vertigo takes prn Meclizine    Family/ staff Communication: plan of care reviewed with the patient and charge nurse.   Labs/tests ordered:  none  Time spend 35 minutes.

## 2021-05-11 NOTE — Assessment & Plan Note (Signed)
blood pressure is controlled, decreased Diltiazem 120mg 03/29/21, ASA 81mg qd. Bun/creat 20/0.7 04/09/21 

## 2021-05-11 NOTE — Assessment & Plan Note (Signed)
takes Atorvastatin 10mg qd. LDL 88 01/07/21 °

## 2021-05-11 NOTE — Assessment & Plan Note (Signed)
better, takes Sertraline, prn Lorazepam 

## 2021-05-11 NOTE — Assessment & Plan Note (Signed)
Limited safety awareness and increased gait instability are contributory, close supervision and assistance needed for safety.

## 2021-05-11 NOTE — Assessment & Plan Note (Signed)
takes Colace bid, MiraLax qod.  

## 2021-05-11 NOTE — Assessment & Plan Note (Signed)
takes prn Meclizine °

## 2021-05-11 NOTE — Assessment & Plan Note (Signed)
takes Ca, Vit D, has been Reclast before.  °

## 2021-05-11 NOTE — Assessment & Plan Note (Signed)
Progressing, on Memantine 10mg  bid for memory.

## 2021-05-11 NOTE — Assessment & Plan Note (Signed)
heart rate is in control, takes Diltiazem, ASA, Hgb 13 03/18/21 

## 2021-05-11 NOTE — Assessment & Plan Note (Signed)
Na 134 04/09/21

## 2021-05-14 ENCOUNTER — Non-Acute Institutional Stay (SKILLED_NURSING_FACILITY): Payer: Medicare PPO | Admitting: Nurse Practitioner

## 2021-05-14 ENCOUNTER — Encounter: Payer: Self-pay | Admitting: Nurse Practitioner

## 2021-05-14 DIAGNOSIS — E782 Mixed hyperlipidemia: Secondary | ICD-10-CM

## 2021-05-14 DIAGNOSIS — U071 COVID-19: Secondary | ICD-10-CM | POA: Diagnosis not present

## 2021-05-14 DIAGNOSIS — I480 Paroxysmal atrial fibrillation: Secondary | ICD-10-CM | POA: Diagnosis not present

## 2021-05-14 DIAGNOSIS — F0150 Vascular dementia without behavioral disturbance: Secondary | ICD-10-CM | POA: Diagnosis not present

## 2021-05-14 DIAGNOSIS — I10 Essential (primary) hypertension: Secondary | ICD-10-CM | POA: Diagnosis not present

## 2021-05-14 DIAGNOSIS — F0391 Unspecified dementia with behavioral disturbance: Secondary | ICD-10-CM | POA: Diagnosis not present

## 2021-05-14 DIAGNOSIS — M810 Age-related osteoporosis without current pathological fracture: Secondary | ICD-10-CM | POA: Diagnosis not present

## 2021-05-14 DIAGNOSIS — F0394 Unspecified dementia, unspecified severity, with anxiety: Secondary | ICD-10-CM

## 2021-05-14 DIAGNOSIS — D649 Anemia, unspecified: Secondary | ICD-10-CM | POA: Diagnosis not present

## 2021-05-14 DIAGNOSIS — K5909 Other constipation: Secondary | ICD-10-CM

## 2021-05-14 DIAGNOSIS — H8110 Benign paroxysmal vertigo, unspecified ear: Secondary | ICD-10-CM

## 2021-05-14 DIAGNOSIS — E871 Hypo-osmolality and hyponatremia: Secondary | ICD-10-CM

## 2021-05-14 NOTE — Assessment & Plan Note (Signed)
The patient resides in [facility] for safety, care assistance, on Memantine 10mg bid for memory.   

## 2021-05-14 NOTE — Assessment & Plan Note (Signed)
the patient's generalized weakness, cough, nasal congestion, decreased appetite/oral intake. Tested positive COVID. She is afebrile, no O2 desaturation. CBC/diff, CMP/eGR stat. Paxlovid 100/150mg  bid x 5 days.

## 2021-05-14 NOTE — Progress Notes (Addendum)
Location:   [facility] Room Number: 25Nursing home Room 25 Place of Service:  SNF (31)SNF Provider: Marlana Latus NP  Deltha Bernales X, NP  Patient Care Team: Tobi Groesbeck X, NP as PCP - General (Internal Medicine) Nahser, Wonda Cheng, MD as PCP - Cardiology (Cardiology) Melina Schools, MD as Consulting Physician (Orthopedic Surgery) Leonie Drago Hammonds, MD as Consulting Physician (Cardiology) Lucious Zou X, NP as Nurse Practitioner (Internal Medicine)  Extended Emergency Contact Information Primary Emergency Contact: [name],Lakeysha Address: [address]           [place] [address] Johnnette Litter of [place] Phone: [phone] Mobile Phone: [phone] Relation: Niece Secondary Emergency Contact: [name],[name]  [place] Mobile Phone: [phone] Relation: Niece  Code Status: DNR Goals of care: Advanced Directive information Advanced Directives 05/14/2021  Does Patient Have a Medical Advance Directive? Yes  Type of Paramedic of [place];Out of facility DNR (pink MOST or yellow form);Living will  Does patient want to make changes to medical advance directive? No - Patient declined  Copy of [place] in Chart? Yes - validated most recent copy scanned in chart (See row information)  Would patient like information on creating a medical advance directive? -  Pre-existing out of facility DNR order (yellow form or pink MOST form) Pink MOST form placed in chart (order not valid for inpatient use)     Chief Complaint  Patient presents with   Acute Visit    COVID positive     HPI:  Pt is a 87 y.o. female seen today for an acute visit for the patient's generalized weakness, cough, nasal congestion, decreased appetite/oral intake. Tested positive COVID. She is afebrile, no O2 desaturation.   Anxiety, better, takes Sertraline, prn Lorazepam             AFib, heart rate is in control, takes Diltiazem, ASA, Hgb 13  03/18/21             The patient resides in [facility] for safety, care assistance, on Memantine 10mg  bid for memory.             HTN, blood pressure is controlled, decreased Diltiazem 120mg  03/29/21, ASA 81mg  qd. Bun/creat 20/0.7 04/09/21             Hyperlipidemia, takes Atorvastatin 10mg  qd. LDL 88 01/07/21             OP, takes Ca, Vit D, has been Reclast before.              Constipation, takes Colace bid, MiraLax qod.              Hyponatremia, Na 134 04/09/21             BPPV, takes prn Meclizine   Past Medical History:  Diagnosis Date   Arteriosclerotic cardiovascular disease    Bee sting reaction 08/23/2016   Tongue swelling and difficulty swallowing /notes 08/23/2016   Constipation    Degenerative joint disease of right hip 10/15/14   Fracture of multiple pubic rami ([place]) 10/15/14   Right inferior and superior pubic rami   HOH (hard of hearing)    Wears bilateral hearing aids   Hyperlipidemia    Hypertension    Macular degeneration    Memory deficit    Osteoporosis    Pain in right foot    Palpitations    Paroxysmal atrial fibrillation (HCC)    Thyroid nodule  Status post surgery   Ulcer of hard palate    Vertigo    Past Surgical History:  Procedure Laterality Date   ABDOMINAL HYSTERECTOMY  1968   APPENDECTOMY  1940's   COLONOSCOPY     FOOT SURGERY Left    HARDWARE REMOVAL Right 02/16/2016   Procedure: HARDWARE REMOVAL RT HIP;  Surgeon: Renette Butters, MD;  Location: [facility];  Service: Orthopedics;  Laterality: Right;   HIP ARTHROPLASTY Right 02/16/2016   Procedure: ARTHROPLASTY BIPOLAR HIP (HEMIARTHROPLASTY);  Surgeon: Renette Butters, MD;  Location: [facility];  Service: Orthopedics;  Laterality: Right;   INTRAMEDULLARY (IM) NAIL INTERTROCHANTERIC Right 09/11/2015   Procedure: INTRAMEDULLARY (IM) NAIL INTERTROCHANTRIC;  Surgeon: Renette Butters, MD;  Location: [facility];  Service: Orthopedics;  Laterality: Right;   KNEE SURGERY      No Known Allergies  Allergies as of  05/14/2021   No Known Allergies      Medication List        Accurate as of May 14, 2021 11:59 PM. If you have any questions, ask your nurse or doctor.          Aspirin 81 81 MG chewable tablet Generic drug: aspirin Chew 81 mg by mouth daily. What changed: Another medication with the same name was removed. Continue taking this medication, and follow the directions you see here. Changed by: Jaecob Lowden X Caren Garske, NP   atorvastatin 10 MG tablet Commonly known as: LIPITOR Take 10 mg by mouth daily.   Biotin 10 MG Tabs Take by mouth daily.   calcium carbonate 1500 (600 Ca) MG Tabs tablet Commonly known as: OSCAL Take 600 mg of elemental calcium by mouth 2 (two) times daily with a meal.   diltiazem 120 MG tablet Commonly known as: CARDIZEM Take 120 mg by mouth daily.   docusate sodium 100 MG capsule Commonly known as: COLACE Take 100 mg by mouth 2 (two) times daily.   EPINEPHrine 0.3 mg/0.3 mL Soaj injection Commonly known as: EPI-PEN Inject 0.3 mLs (0.3 mg total) into the muscle once as needed (anaphylaxis/allergic reaction).   lactose free nutrition Liqd Take 237 mLs by mouth. Once a day at breakfast   LORazepam 0.5 MG tablet Commonly known as: ATIVAN Take 0.5 mg by mouth every 6 (six) hours as needed for anxiety.   meclizine 12.5 MG tablet Commonly known as: ANTIVERT Take 12.5 mg by mouth daily as needed for dizziness.   melatonin 3 MG Tabs tablet Take 3 mg by mouth at bedtime as needed (sleep).   memantine 10 MG tablet Commonly known as: NAMENDA Take 10 mg by mouth 2 (two) times daily.   polyethylene glycol 17 g packet Commonly known as: MIRALAX / GLYCOLAX Take 17 g by mouth every other day.   sertraline 50 MG tablet Commonly known as: ZOLOFT Take 50 mg by mouth daily.   Therems-M Tabs Take 1 tablet by mouth daily.   PreserVision AREDS Tabs Take 2 tablets by mouth daily.   Vitamin D3 50 MCG (2000 UT) Tabs Take 2,000 Units by mouth daily.         Review of Systems  Constitutional:  Positive for activity change, appetite change and fatigue. Negative for fever.       Pacing, restlessness  HENT:  Positive for congestion, hearing loss and rhinorrhea. Negative for sinus pressure, sinus pain, sore throat and voice change.        Hears better left ear  Respiratory:  Positive for cough. Negative for chest tightness,  shortness of breath and wheezing.   Cardiovascular:  Negative for leg swelling.  Gastrointestinal:  Negative for abdominal pain and constipation.  Genitourinary:  Negative for dysuria and urgency.  Musculoskeletal:  Positive for gait problem.  Skin:  Negative for color change.  Neurological:  Negative for speech difficulty, weakness, light-headedness and headaches.       Memory lapses. Chronic on and off dizziness  Psychiatric/Behavioral:  Positive for agitation, behavioral problems, confusion and sleep disturbance. Negative for hallucinations. The patient is nervous/anxious and is hyperactive.        Improving   Immunization History  Administered Date(s) Administered   Influenza Whole 08/25/2018   Influenza, High Dose Seasonal PF 08/24/2019   Influenza-Unspecified 09/07/2016, 09/12/2017, 09/02/2020   Moderna Sars-Covid-2 Vaccination 11/23/2019, 12/21/2019, 09/29/2020, 04/20/2021   PPD Test 02/18/2016   Pneumococcal Conjugate-13 09/18/2017   Pneumococcal-Unspecified 10/22/2018   Tdap 10/07/2012   Pertinent  Health Maintenance Due  Topic Date Due   INFLUENZA VACCINE  06/21/2021   DEXA SCAN  Completed   PNA vac Low Risk Adult  Completed   Fall Risk  08/17/2018 08/03/2017 10/24/2014  Falls in the past year? No No Yes  Number falls in past yr: - - 1  Injury with Fall? - - Yes  Risk Factor Category  - - High Fall Risk  Risk for fall due to : - - History of fall(s)   Functional Status Survey:    Vitals:   05/14/21 1221  BP: 133/85  Pulse: 79  Resp: 18  Temp: 98 F (36.7 C)  SpO2: 98%  Weight: 122 lb 12.8 oz  (55.7 kg)  Height: $Remove'5\' 1"'LFvXIjq$  (1.549 m)   Body mass index is 23.2 kg/m. Physical Exam Vitals and nursing note reviewed.  Constitutional:      Comments: En eyes when spoke to  HENT:     Head: Normocephalic and atraumatic.     Mouth/Throat:     Mouth: Mucous membranes are dry.     Pharynx: No oropharyngeal exudate or posterior oropharyngeal erythema.  Eyes:     Extraocular Movements: Extraocular movements intact.     Conjunctiva/sclera: Conjunctivae normal.     Pupils: Pupils are equal, round, and reactive to light.  Cardiovascular:     Rate and Rhythm: Normal rate and regular rhythm.     Heart sounds: No murmur heard. Pulmonary:     Effort: Pulmonary effort is normal.     Breath sounds: No rales.  Abdominal:     General: Bowel sounds are normal.     Palpations: Abdomen is soft.     Tenderness: no abdominal tenderness  Musculoskeletal:     Cervical back: Normal range of motion and neck supple.     Right lower leg: No edema.     Left lower leg: No edema.  Skin:    General: Skin is warm and dry.  Neurological:     General: No focal deficit present.     Mental Status: She is alert. Mental status is at baseline.     Gait: Gait abnormal.     Comments: Oriented to person  Psychiatric:     Comments: Opened eyes when spoke to     Labs reviewed: Recent Labs    06/03/20 0952 09/11/20 0000 11/24/20 0000 03/19/21 1221 04/09/21 0000  NA 131*   < > 135* 138 134*  K 3.7   < > 4.2 3.9 4.2  CL 99   < > 103 104 105  CO2 21*   < >  24* 26* 22  GLUCOSE 107*  --   --   --   --   BUN 13   < > 21 25* 20  CREATININE 0.59   < > 0.7 0.8 0.7  CALCIUM 9.8   < > 9.5 9.9 9.3   < > = values in this interval not displayed.   Recent Labs    06/03/20 0952 09/11/20 0000 11/24/20 0000 03/19/21 1221 04/09/21 0000  AST 26   < > 19 21 30   ALT 19   < > 14 15 18   ALKPHOS 51   < > 57 64 62  BILITOT 0.9  --   --   --   --   PROT 7.1  --   --   --   --   ALBUMIN 4.1   < > 3.9 4.0 3.5   < > =  values in this interval not displayed.   Recent Labs    06/03/20 1022 09/11/20 0000 11/24/20 0000 03/19/21 1221 04/09/21 0000  WBC 6.5   < > 6.4 6.9 5.6  NEUTROABS 5.3   < > 4,518.00 5,099.00 3,730.00  HGB 13.2   < > 12.9 13.0 11.7*  HCT 39.2   < > 38 38 35*  MCV 94.2  --   --   --   --   PLT 266   < > 292 251 237   < > = values in this interval not displayed.   Lab Results  Component Value Date   TSH 2.703 06/03/2020   Lab Results  Component Value Date   HGBA1C 5.4 03/14/2018   Lab Results  Component Value Date   CHOL 178 01/07/2021   HDL 65 01/07/2021   LDLCALC 88 01/07/2021   TRIG 145 01/07/2021   CHOLHDL 2.6 03/14/2018    Significant Diagnostic Results in last 30 days:  Intravitreal Injection, Pharmacologic Agent - OD - Right Eye  Result Date: 04/26/2021 Time Out 04/26/2021. 10:21 AM. Confirmed correct patient, procedure, site, and patient consented. Anesthesia Topical anesthesia was used. Anesthetic medications included Akten 3.5%. Procedure Preparation included Tobramycin 0.3%, 10% betadine to eyelids, 5% betadine to ocular surface, Ofloxacin . A supplied needle was used. Injection: 2.5 mg Bevacizumab (AVASTIN) 2.5mg /0.4mL SOSY   NDC: 14970-263-78, Lot: [id number]   Route: Intravitreal, Site: Right Eye Post-op Post injection exam found visual acuity of at least counting fingers. The patient tolerated the procedure well. There were no complications. The patient received written and verbal post procedure care education. Post injection medications were not given.   OCT, Retina - OU - Both Eyes  Result Date: 04/26/2021 Right Eye Quality was good. Scan locations included subfoveal. Central Foveal Thickness: 543. Progression has worsened. Findings include retinal drusen , choroidal neovascular membrane, cystoid macular edema, abnormal foveal contour, outer retinal atrophy, central retinal atrophy. Left Eye Quality was good. Scan locations included subfoveal. Central Foveal  Thickness: 321. Progression has been stable. Findings include abnormal foveal contour, subretinal fluid, pigment epithelial detachment. Notes OD with chronically active CNVM, CME are stable centrally  at the 8-week interval today repeat intravitreal Avastin today. 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 Tested positive COVID. She is afebrile, no O2 desaturation. CBC/diff, CMP/eGR stat. Paxlovid 100/150mg  bid x 5 days.   Anxiety  due to dementia [facility]) better, takes Sertraline, prn Lorazepam  PAF (paroxysmal atrial fibrillation) (HCC) eart rate is in control, takes Diltiazem, ASA, Hgb 13 03/18/21  Vascular dementia [facility]) The patient resides in SNF  [facility] for safety, care assistance, on Memantine 10mg  bid for memory.  Essential hypertension blood pressure is controlled, decreased Diltiazem 120mg  03/29/21, ASA 81mg  qd. Bun/creat 20/0.7 04/09/21  Hyperlipidemia takes Atorvastatin 10mg  qd. LDL 88 01/07/21  Osteoporosis without current pathological fracture  takes Ca, Vit D, has been Reclast before.   Chronic constipation takes Colace bid, MiraLax qod.   Hyponatremia Na 134 04/09/21 Na 131 05/14/21 BMP one week.   Benign paroxysmal positional vertigo BPPV, takes prn Meclizine    Family/ staff Communication: plan of care reviewed with the patient and charge nurse.   Labs/tests ordered: CBC/diff, CMP/eGFR  Time spend 35 minutes.

## 2021-05-14 NOTE — Assessment & Plan Note (Signed)
eart rate is in control, takes Diltiazem, ASA, Hgb 13 03/18/21

## 2021-05-14 NOTE — Assessment & Plan Note (Signed)
takes Colace bid, MiraLax qod.  

## 2021-05-14 NOTE — Assessment & Plan Note (Signed)
BPPV, takes prn Meclizine.   

## 2021-05-14 NOTE — Assessment & Plan Note (Signed)
takes Ca, Vit D, has been Reclast before.  °

## 2021-05-14 NOTE — Assessment & Plan Note (Signed)
blood pressure is controlled, decreased Diltiazem 120mg 03/29/21, ASA 81mg qd. Bun/creat 20/0.7 04/09/21 

## 2021-05-14 NOTE — Assessment & Plan Note (Signed)
takes Atorvastatin 10mg qd. LDL 88 01/07/21 °

## 2021-05-14 NOTE — Assessment & Plan Note (Addendum)
Na 134 04/09/21 Na 131 05/14/21 BMP one week.

## 2021-05-14 NOTE — Assessment & Plan Note (Signed)
better, takes Sertraline, prn Lorazepam

## 2021-05-25 DIAGNOSIS — D509 Iron deficiency anemia, unspecified: Secondary | ICD-10-CM | POA: Diagnosis not present

## 2021-05-26 LAB — BASIC METABOLIC PANEL
BUN: 18 (ref 4–21)
CO2: 21 (ref 13–22)
Chloride: 102 (ref 99–108)
Creatinine: 0.7 (ref 0.5–1.1)
Glucose: 70
Potassium: 4.3 (ref 3.4–5.3)
Sodium: 136 — AB (ref 137–147)

## 2021-05-26 LAB — COMPREHENSIVE METABOLIC PANEL: Calcium: 9.6 (ref 8.7–10.7)

## 2021-06-03 DIAGNOSIS — F0391 Unspecified dementia with behavioral disturbance: Secondary | ICD-10-CM | POA: Diagnosis not present

## 2021-06-03 DIAGNOSIS — F329 Major depressive disorder, single episode, unspecified: Secondary | ICD-10-CM | POA: Diagnosis not present

## 2021-06-03 DIAGNOSIS — F419 Anxiety disorder, unspecified: Secondary | ICD-10-CM | POA: Diagnosis not present

## 2021-06-04 ENCOUNTER — Encounter: Payer: Self-pay | Admitting: Nurse Practitioner

## 2021-06-04 ENCOUNTER — Non-Acute Institutional Stay (SKILLED_NURSING_FACILITY): Payer: Medicare PPO | Admitting: Nurse Practitioner

## 2021-06-04 DIAGNOSIS — U071 COVID-19: Secondary | ICD-10-CM

## 2021-06-04 DIAGNOSIS — I480 Paroxysmal atrial fibrillation: Secondary | ICD-10-CM

## 2021-06-04 DIAGNOSIS — M810 Age-related osteoporosis without current pathological fracture: Secondary | ICD-10-CM | POA: Diagnosis not present

## 2021-06-04 DIAGNOSIS — F0394 Unspecified dementia, unspecified severity, with anxiety: Secondary | ICD-10-CM

## 2021-06-04 DIAGNOSIS — E871 Hypo-osmolality and hyponatremia: Secondary | ICD-10-CM | POA: Diagnosis not present

## 2021-06-04 DIAGNOSIS — F0391 Unspecified dementia with behavioral disturbance: Secondary | ICD-10-CM | POA: Diagnosis not present

## 2021-06-04 DIAGNOSIS — F0150 Vascular dementia without behavioral disturbance: Secondary | ICD-10-CM | POA: Diagnosis not present

## 2021-06-04 DIAGNOSIS — I10 Essential (primary) hypertension: Secondary | ICD-10-CM

## 2021-06-04 DIAGNOSIS — K5909 Other constipation: Secondary | ICD-10-CM

## 2021-06-04 DIAGNOSIS — R634 Abnormal weight loss: Secondary | ICD-10-CM

## 2021-06-04 DIAGNOSIS — E782 Mixed hyperlipidemia: Secondary | ICD-10-CM

## 2021-06-04 DIAGNOSIS — H8110 Benign paroxysmal vertigo, unspecified ear: Secondary | ICD-10-CM

## 2021-06-04 NOTE — Assessment & Plan Note (Signed)
COVID infection, s/p Paxilovid

## 2021-06-04 NOTE — Assessment & Plan Note (Signed)
Weight loss about #5Ibs since COVID, observe.

## 2021-06-04 NOTE — Progress Notes (Signed)
Location:   SNF [facility] Room Number: N025 Place of Service:  SNF (31) Provider: Lennie Odor Miyoko Hashimi NP  Skylie Hiott X, NP  Patient Care Team: Carnelia Oscar X, NP as PCP - General (Internal Medicine) Nahser, Wonda Cheng, MD as PCP - Cardiology (Cardiology) Melina Schools, MD as Consulting Physician (Orthopedic Surgery) Leonie Moussa Wiegand, MD as Consulting Physician (Cardiology) Shatora Weatherbee X, NP as Nurse Practitioner (Internal Medicine)  Extended Emergency Contact Information Primary Emergency Contact: [name],Fabian Address: [place] [address] Johnnette Litter of [place] Phone: [phone] Mobile Phone: [phone] Relation: Niece Secondary Emergency Contact: [name],[name]  [place] Mobile Phone: [phone] Relation: Niece  Code Status: DNR Goals of care: Advanced Directive information Advanced Directives 06/04/2021  Does Patient Have a Medical Advance Directive? Yes  Type of Paramedic of [place];Living will;Out of facility DNR (pink MOST or yellow form)  Does patient want to make changes to medical advance directive? No - Patient declined  Copy of [place] in Chart? Yes - validated most recent copy scanned in chart (See row information)  Would patient like information on creating a medical advance directive? -  Pre-existing out of facility DNR order (yellow form or pink MOST form) Yellow form placed in chart (order not valid for inpatient use);Pink MOST form placed in chart (order not valid for inpatient use)     Chief Complaint  Patient presents with   Acute Visit    Patient presents for a review of medications.     HPI:  Pt is a 87 y.o. female seen today for an acute visit for reported the patient's increased anxeity  COVID infection, s/p Paxilovid             Anxiety, relapsed after initial improvement,  takes Sertraline, prn Lorazepam             AFib, heart rate is in control, takes  Diltiazem, ASA, Hgb 11.7 04/09/21             The patient resides in [facility] for safety, care assistance, on Memantine 10mg  bid for memory.             HTN, blood pressure is controlled, decreased Diltiazem 120mg  03/29/21, ASA 81mg  qd. Bun/creat 18/0.67 05/25/21             Hyperlipidemia, takes Atorvastatin 10mg  qd. LDL 88 01/07/21             OP, takes Ca, Vit D, has been Reclast before.              Constipation, takes Colace bid, MiraLax qod.              Hyponatremia, Na 136 05/25/21             BPPV, takes prn Meclizine    Past Medical History:  Diagnosis Date   Arteriosclerotic cardiovascular disease    Bee sting reaction 08/23/2016   Tongue swelling and difficulty swallowing /notes 08/23/2016   Constipation    Degenerative joint disease of right hip 10/15/14   Fracture of multiple pubic rami ([place]) 10/15/14   Right inferior and superior pubic rami   HOH (hard of hearing)    Wears bilateral hearing aids   Hyperlipidemia    Hypertension    Macular degeneration    Memory deficit    Osteoporosis    Pain in right foot  Palpitations    Paroxysmal atrial fibrillation (HCC)    Thyroid nodule    Status post surgery   Ulcer of hard palate    Vertigo    Past Surgical History:  Procedure Laterality Date   ABDOMINAL HYSTERECTOMY  1968   APPENDECTOMY  1940's   COLONOSCOPY     FOOT SURGERY Left    HARDWARE REMOVAL Right 02/16/2016   Procedure: HARDWARE REMOVAL RT HIP;  Surgeon: Renette Butters, MD;  Location: [facility];  Service: Orthopedics;  Laterality: Right;   HIP ARTHROPLASTY Right 02/16/2016   Procedure: ARTHROPLASTY BIPOLAR HIP (HEMIARTHROPLASTY);  Surgeon: Renette Butters, MD;  Location: [facility];  Service: Orthopedics;  Laterality: Right;   INTRAMEDULLARY (IM) NAIL INTERTROCHANTERIC Right 09/11/2015   Procedure: INTRAMEDULLARY (IM) NAIL INTERTROCHANTRIC;  Surgeon: Renette Butters, MD;  Location: [facility];  Service: Orthopedics;  Laterality: Right;   KNEE SURGERY      No Known  Allergies  Allergies as of 06/04/2021   No Known Allergies      Medication List        Accurate as of June 04, 2021  3:54 PM. If you have any questions, ask your nurse or doctor.          STOP taking these medications    LORazepam 0.5 MG tablet Commonly known as: ATIVAN Stopped by: Polina Burmaster X Vicci Reder, NP       TAKE these medications    aspirin 81 MG chewable tablet Chew 81 mg by mouth daily.   atorvastatin 10 MG tablet Commonly known as: LIPITOR Take 10 mg by mouth daily.   Biotin 10 MG Tabs Take by mouth daily.   calcium carbonate 1500 (600 Ca) MG Tabs tablet Commonly known as: OSCAL Take 600 mg of elemental calcium by mouth 2 (two) times daily with a meal.   diltiazem 120 MG tablet Commonly known as: CARDIZEM Take 120 mg by mouth daily.   docusate sodium 100 MG capsule Commonly known as: COLACE Take 100 mg by mouth 2 (two) times daily.   EPINEPHrine 0.3 mg/0.3 mL Soaj injection Commonly known as: EPI-PEN Inject 0.3 mLs (0.3 mg total) into the muscle once as needed (anaphylaxis/allergic reaction).   lactose free nutrition Liqd Take 237 mLs by mouth. Once a day at breakfast   meclizine 12.5 MG tablet Commonly known as: ANTIVERT Take 12.5 mg by mouth daily as needed for dizziness.   melatonin 3 MG Tabs tablet Take 3 mg by mouth at bedtime as needed (sleep).   memantine 10 MG tablet Commonly known as: NAMENDA Take 10 mg by mouth 2 (two) times daily.   polyethylene glycol 17 g packet Commonly known as: MIRALAX / GLYCOLAX Take 17 g by mouth every other day.   sertraline 50 MG tablet Commonly known as: ZOLOFT Take 50 mg by mouth daily.   Therems-M Tabs Take 1 tablet by mouth daily.   PreserVision AREDS Tabs Take 2 tablets by mouth daily.   Vitamin D3 50 MCG (2000 UT) Tabs Take 2,000 Units by mouth daily.        Review of Systems  Constitutional:  Positive for unexpected weight change. Negative for activity change, appetite change and fever.        Pacing, restlessness. Weight loss about #5Ibs since COVID  HENT:  Positive for hearing loss and rhinorrhea. Negative for congestion and voice change.        Hears better left ear  Respiratory:  Negative for cough, shortness of breath and wheezing.  Cardiovascular:  Negative for leg swelling.  Gastrointestinal:  Negative for abdominal pain and constipation.  Genitourinary:  Negative for dysuria and urgency.  Musculoskeletal:  Positive for gait problem.  Skin:  Negative for color change.  Neurological:  Negative for speech difficulty, weakness and light-headedness.       Memory lapses. Chronic on and off dizziness  Psychiatric/Behavioral:  Positive for agitation, behavioral problems, confusion and sleep disturbance. Negative for hallucinations. The patient is nervous/anxious and is hyperactive.    Immunization History  Administered Date(s) Administered   Influenza Whole 08/25/2018   Influenza, High Dose Seasonal PF 08/24/2019   Influenza-Unspecified 09/07/2016, 09/12/2017, 09/02/2020   Moderna Sars-Covid-2 Vaccination 11/23/2019, 12/21/2019, 09/29/2020, 04/20/2021   PPD Test 02/18/2016   Pneumococcal Conjugate-13 09/18/2017   Pneumococcal-Unspecified 10/22/2018   Tdap 10/07/2012   Pertinent  Health Maintenance Due  Topic Date Due   INFLUENZA VACCINE  06/21/2021   DEXA SCAN  Completed   PNA vac Low Risk Adult  Completed   Fall Risk  08/17/2018 08/03/2017 10/24/2014  Falls in the past year? No No Yes  Number falls in past yr: - - 1  Injury with Fall? - - Yes  Risk Factor Category  - - High Fall Risk  Risk for fall due to : - - History of fall(s)   Functional Status Survey:    Vitals:   06/04/21 1429  BP: 120/74  Pulse: 78  Resp: 18  Temp: 97.7 F (36.5 C)  SpO2: 98%  Weight: 117 lb 1.6 oz (53.1 kg)  Height: $Remove'5\' 1"'BoBWEVt$  (1.549 m)   Body mass index is 22.13 kg/m. Physical Exam Vitals and nursing note reviewed.  Constitutional:      Appearance: Normal appearance.   HENT:     Head: Normocephalic and atraumatic.     Mouth/Throat:     Mouth: Mucous membranes are moist.  Eyes:     Extraocular Movements: Extraocular movements intact.     Conjunctiva/sclera: Conjunctivae normal.     Pupils: Pupils are equal, round, and reactive to light.  Cardiovascular:     Rate and Rhythm: Normal rate and regular rhythm.     Heart sounds: No murmur heard. Pulmonary:     Effort: Pulmonary effort is normal.     Breath sounds: No rales.  Abdominal:     General: Bowel sounds are normal.     Palpations: Abdomen is soft.     Tenderness: There is no abdominal tenderness.  Musculoskeletal:     Cervical back: Normal range of motion and neck supple.     Right lower leg: No edema.     Left lower leg: No edema.  Skin:    General: Skin is warm and dry.  Neurological:     General: No focal deficit present.     Mental Status: She is alert. Mental status is at baseline.     Gait: Gait abnormal.     Comments: Oriented to person  Psychiatric:     Comments: Appears anxious.     Labs reviewed: Recent Labs    11/24/20 0000 03/19/21 1221 04/09/21 0000  NA 135* 138 134*  K 4.2 3.9 4.2  CL 103 104 105  CO2 24* 26* 22  BUN 21 25* 20  CREATININE 0.7 0.8 0.7  CALCIUM 9.5 9.9 9.3   Recent Labs    11/24/20 0000 03/19/21 1221 04/09/21 0000  AST $Re'19 21 30  'ujv$ ALT $R'14 15 18  'LX$ ALKPHOS 57 64 62  ALBUMIN 3.9 4.0 3.5  Recent Labs    11/24/20 0000 03/19/21 1221 04/09/21 0000  WBC 6.4 6.9 5.6  NEUTROABS 4,518.00 5,099.00 3,730.00  HGB 12.9 13.0 11.7*  HCT 38 38 35*  PLT 292 251 237   Lab Results  Component Value Date   TSH 2.703 06/03/2020   Lab Results  Component Value Date   HGBA1C 5.4 03/14/2018   Lab Results  Component Value Date   CHOL 178 01/07/2021   HDL 65 01/07/2021   LDLCALC 88 01/07/2021   TRIG 145 01/07/2021   CHOLHDL 2.6 03/14/2018    Significant Diagnostic Results in last 30 days:  No results found.  Assessment/Plan: Anxiety due to  dementia [facility]) relapsed after initial improvement,  takes Sertraline, prn Lorazepam, will add Seroquel 12.5mg  qd to augment anxiety tx. Update CBC/diff, CMP/eGFR.   COVID-19 virus infection COVID infection, s/p Paxilovid  PAF (paroxysmal atrial fibrillation) (HCC) heart rate is in control, takes Diltiazem, ASA, Hgb 11.7 04/09/21  Vascular dementia [facility]) The patient resides in SNF  [facility] for safety, care assistance, on Memantine 10mg  bid for memory.  Essential hypertension blood pressure is controlled, decreased Diltiazem 120mg  03/29/21, ASA 81mg  qd. Bun/creat 18/0.67 05/25/21  Hyperlipidemia takes Atorvastatin 10mg  qd. LDL 88 01/07/21  Osteoporosis without current pathological fracture takes Ca, Vit D, has been Reclast before.   Chronic constipation  takes Colace bid, MiraLax qod.   Hyponatremia Na 136 05/25/21  Benign paroxysmal positional vertigo Prn Meclizine.   Weight loss Weight loss about #5Ibs since COVID, observe.     Family/ staff Communication: plan of care reviewed with the patient and charge nurse.   Labs/tests ordered:  CBC/diff, CMP/eGFR  Time spend 35 minutes.

## 2021-06-04 NOTE — Assessment & Plan Note (Signed)
takes Colace bid, MiraLax qod.  

## 2021-06-04 NOTE — Assessment & Plan Note (Signed)
takes Ca, Vit D, has been Reclast before.  °

## 2021-06-04 NOTE — Assessment & Plan Note (Signed)
takes Atorvastatin 10mg qd. LDL 88 01/07/21 °

## 2021-06-04 NOTE — Assessment & Plan Note (Signed)
The patient resides in [facility] for safety, care assistance, on Memantine 10mg bid for memory.   

## 2021-06-04 NOTE — Assessment & Plan Note (Signed)
relapsed after initial improvement,  takes Sertraline, prn Lorazepam, will add Seroquel 12.$RemoveBeforeD'5mg'rNrGRbMVMaHkoW$  qd to augment anxiety tx. Update CBC/diff, CMP/eGFR.

## 2021-06-04 NOTE — Assessment & Plan Note (Signed)
Prn Meclizine.  

## 2021-06-04 NOTE — Assessment & Plan Note (Signed)
Na 136 05/25/21

## 2021-06-04 NOTE — Assessment & Plan Note (Signed)
blood pressure is controlled, decreased Diltiazem 120mg  03/29/21, ASA 81mg  qd. Bun/creat 18/0.67 05/25/21

## 2021-06-04 NOTE — Assessment & Plan Note (Signed)
heart rate is in control, takes Diltiazem, ASA, Hgb 11.7 04/09/21

## 2021-06-08 ENCOUNTER — Non-Acute Institutional Stay (SKILLED_NURSING_FACILITY): Payer: Medicare PPO | Admitting: Internal Medicine

## 2021-06-08 DIAGNOSIS — N179 Acute kidney failure, unspecified: Secondary | ICD-10-CM | POA: Diagnosis not present

## 2021-06-08 DIAGNOSIS — E782 Mixed hyperlipidemia: Secondary | ICD-10-CM

## 2021-06-08 DIAGNOSIS — I498 Other specified cardiac arrhythmias: Secondary | ICD-10-CM

## 2021-06-08 DIAGNOSIS — F0391 Unspecified dementia with behavioral disturbance: Secondary | ICD-10-CM

## 2021-06-08 DIAGNOSIS — D509 Iron deficiency anemia, unspecified: Secondary | ICD-10-CM | POA: Diagnosis not present

## 2021-06-08 DIAGNOSIS — D649 Anemia, unspecified: Secondary | ICD-10-CM | POA: Diagnosis not present

## 2021-06-08 DIAGNOSIS — M810 Age-related osteoporosis without current pathological fracture: Secondary | ICD-10-CM

## 2021-06-08 DIAGNOSIS — I10 Essential (primary) hypertension: Secondary | ICD-10-CM | POA: Diagnosis not present

## 2021-06-08 DIAGNOSIS — I482 Chronic atrial fibrillation, unspecified: Secondary | ICD-10-CM | POA: Diagnosis not present

## 2021-06-08 LAB — BASIC METABOLIC PANEL
BUN: 21 (ref 4–21)
CO2: 23 — AB (ref 13–22)
Chloride: 104 (ref 99–108)
Creatinine: 0.7 (ref 0.5–1.1)
Glucose: 87
Potassium: 4.4 (ref 3.4–5.3)
Sodium: 135 — AB (ref 137–147)

## 2021-06-08 LAB — COMPREHENSIVE METABOLIC PANEL
Albumin: 3.9 (ref 3.5–5.0)
Calcium: 9.5 (ref 8.7–10.7)
Globulin: 2.9

## 2021-06-08 LAB — HEPATIC FUNCTION PANEL
ALT: 32 (ref 7–35)
AST: 29 (ref 13–35)
Alkaline Phosphatase: 156 — AB (ref 25–125)
Bilirubin, Total: 0.5

## 2021-06-08 LAB — CBC AND DIFFERENTIAL
HCT: 39 (ref 36–46)
Hemoglobin: 12.9 (ref 12.0–16.0)
Neutrophils Absolute: 5254
Platelets: 306 (ref 150–399)
WBC: 7.1

## 2021-06-08 LAB — CBC: RBC: 4.24 (ref 3.87–5.11)

## 2021-06-10 ENCOUNTER — Non-Acute Institutional Stay (SKILLED_NURSING_FACILITY): Payer: Medicare PPO | Admitting: Orthopedic Surgery

## 2021-06-10 ENCOUNTER — Encounter: Payer: Self-pay | Admitting: Orthopedic Surgery

## 2021-06-10 DIAGNOSIS — F0391 Unspecified dementia with behavioral disturbance: Secondary | ICD-10-CM

## 2021-06-10 DIAGNOSIS — R7401 Elevation of levels of liver transaminase levels: Secondary | ICD-10-CM | POA: Diagnosis not present

## 2021-06-10 DIAGNOSIS — F0394 Unspecified dementia, unspecified severity, with anxiety: Secondary | ICD-10-CM

## 2021-06-10 NOTE — Progress Notes (Signed)
Location:   [facility] Room Number: [phone] Place of Service:  SNF (31) Provider:  Hazle Nordmann, NP    Patient Care Team: Mast, Man X, NP as PCP - General (Internal Medicine) Nahser, Deloris Ping, MD as PCP - Cardiology (Cardiology) Venita Lick, MD as Consulting Physician (Orthopedic Surgery) Marykay Lex, MD as Consulting Physician (Cardiology) Mast, Man X, NP as Nurse Practitioner (Internal Medicine)  Extended Emergency Contact Information Primary Emergency Contact: [name],Makala Address: [address]           [place], [place] [address] Darden Amber of [place] Home Phone: [phone] Mobile Phone: [phone] Relation: Niece Secondary Emergency Contact: [name],[name]  [place] Mobile Phone: [phone] Relation: Niece  Code Status:  DNR Goals of care: Advanced Directive information Advanced Directives 06/10/2021  Does Patient Have a Medical Advance Directive? Yes  Type of Estate agent of [place];Living will;Out of facility DNR (pink MOST or yellow form)  Does patient want to make changes to medical advance directive? No - Patient declined  Copy of Healthcare Power of Attorney in Chart? Yes - validated most recent copy scanned in chart (See row information)  Would patient like information on creating a medical advance directive? -  Pre-existing out of facility DNR order (yellow form or pink MOST form) Yellow form placed in chart (order not valid for inpatient use);Pink MOST form placed in chart (order not valid for inpatient use)     Chief Complaint  Patient presents with   Acute Visit    Patient presents for elevated ALT and increased confusion     HPI:  Pt is a 87 y.o. female seen today for an acute visit for elevated ALT and increased confusion.   She currently resides on the skilled unit at [facility] due to dementia. Past medical history includes: HTN, PAF, interstitial lung disease,  constipation, dysphagia, thyroid nodule, BPPV, anxiety and gait abnormality.   She was seen 07/15 for increased anxiety due to dementia. She was started on Seroquel 12.5 mg daily, cbc/diff and cmp ordered. Today, nursing reports she continues to yell at times. She has also been found standing in front of her closet making odd noises, some loud. Cmp 06/08/2021 alkaline phosphatase 156, ALT 32. Cbc/diff unremarkable.   Fall without injury reported within last week.   Nurse does not report any other concerns.    Past Medical History:  Diagnosis Date   Arteriosclerotic cardiovascular disease    Bee sting reaction 08/23/2016   Tongue swelling and difficulty swallowing /notes 08/23/2016   Constipation    Degenerative joint disease of right hip 10/15/14   Fracture of multiple pubic rami (HCC) 10/15/14   Right inferior and superior pubic rami   HOH (hard of hearing)    Wears bilateral hearing aids   Hyperlipidemia    Hypertension    Macular degeneration    Memory deficit    Osteoporosis    Pain in right foot    Palpitations    Paroxysmal atrial fibrillation (HCC)    Thyroid nodule    Status post surgery   Ulcer of hard palate    Vertigo    Past Surgical History:  Procedure Laterality Date   ABDOMINAL HYSTERECTOMY  1968   APPENDECTOMY  1940's   COLONOSCOPY     FOOT SURGERY Left    HARDWARE REMOVAL Right 02/16/2016   Procedure: HARDWARE REMOVAL RT HIP;  Surgeon: Sheral Apley, MD;  [facility];  Service: Orthopedics;  Laterality: Right;   HIP ARTHROPLASTY Right 02/16/2016   Procedure: ARTHROPLASTY BIPOLAR HIP (HEMIARTHROPLASTY);  Surgeon: Sheral Apley, MD;  [facility] OR;  Service: Orthopedics;  Laterality: Right;   INTRAMEDULLARY (IM) NAIL INTERTROCHANTERIC Right 09/11/2015   Procedure: INTRAMEDULLARY (IM) NAIL INTERTROCHANTRIC;  Surgeon: Sheral Apley, MD;  Location: [facility] OR;  Service: Orthopedics;  Laterality: Right;   KNEE SURGERY      No Known  Allergies  Allergies as of 06/10/2021   No Known Allergies      Medication List        Accurate as of June 10, 2021 11:29 AM. If you have any questions, ask your nurse or doctor.          aspirin 81 MG chewable tablet Chew 81 mg by mouth daily.   atorvastatin 10 MG tablet Commonly known as: LIPITOR Take 10 mg by mouth daily.   Biotin 10 MG Tabs Take by mouth daily.   calcium carbonate 1500 (600 Ca) MG Tabs tablet Commonly known as: OSCAL Take 600 mg of elemental calcium by mouth 2 (two) times daily with a meal.   diltiazem 120 MG tablet Commonly known as: CARDIZEM Take 120 mg by mouth daily.   docusate sodium 100 MG capsule Commonly known as: COLACE Take 100 mg by mouth 2 (two) times daily.   EPINEPHrine 0.3 mg/0.3 mL Soaj injection Commonly known as: EPI-PEN Inject 0.3 mLs (0.3 mg total) into the muscle once as needed (anaphylaxis/allergic reaction).   lactose free nutrition Liqd Take 237 mLs by mouth. Once a day at breakfast   meclizine 12.5 MG tablet Commonly known as: ANTIVERT Take 12.5 mg by mouth daily as needed for dizziness.   melatonin 3 MG Tabs tablet Take 3 mg by mouth at bedtime as needed (sleep).   memantine 10 MG tablet Commonly known as: NAMENDA Take 10 mg by mouth 2 (two) times daily.   polyethylene glycol 17 g packet Commonly known as: MIRALAX / GLYCOLAX Take 17 g by mouth every other day.   QUEtiapine 25 MG tablet Commonly known as: SEROQUEL Take 12.5 mg by mouth at bedtime.   sertraline 50 MG tablet Commonly known as: ZOLOFT Take 50 mg by mouth daily.   Therems-M Tabs Take 1 tablet by mouth daily.   PreserVision AREDS Tabs Take 2 tablets by mouth daily.   Vitamin D3 50 MCG (2000 UT) Tabs Take 2,000 Units by mouth daily.        Review of Systems  Unable to perform ROS: Dementia   Immunization History  Administered Date(s) Administered   Influenza Whole 08/25/2018   Influenza, High Dose Seasonal PF 08/24/2019    Influenza-Unspecified 09/07/2016, 09/12/2017, 09/02/2020   Moderna Sars-Covid-2 Vaccination 11/23/2019, 12/21/2019, 09/29/2020, 04/20/2021   PPD Test 02/18/2016   Pneumococcal Conjugate-13 09/18/2017   Pneumococcal-Unspecified 10/22/2018   Tdap 10/07/2012   Pertinent  Health Maintenance Due  Topic Date Due   INFLUENZA VACCINE  06/21/2021   DEXA SCAN  Completed   PNA vac Low Risk Adult  Completed   Fall Risk  08/17/2018 08/03/2017 10/24/2014  Falls in the past year? No No Yes  Number falls in past yr: - - 1  Injury with Fall? - - Yes  Risk Factor Category  - - High Fall Risk  Risk for fall due to : - - History of fall(s)   Functional Status Survey:    Vitals:   06/10/21 1124  BP: (!) 158/81  Pulse: 77  Resp: 18  Temp: (!)  97.4 F (36.3 C)  SpO2: 97%  Weight: 117 lb 1.6 oz (53.1 kg)  Height: 5\' 1"  (1.549 m)   Body mass index is 22.13 kg/m. Physical Exam Vitals reviewed.  Constitutional:      General: She is not in acute distress. Cardiovascular:     Rate and Rhythm: Normal rate. Rhythm irregular.     Pulses: Normal pulses.     Heart sounds: Normal heart sounds. No murmur heard. Pulmonary:     Effort: Pulmonary effort is normal. No respiratory distress.     Breath sounds: Normal breath sounds. No wheezing.  Abdominal:     General: Bowel sounds are normal. There is no distension.     Palpations: Abdomen is soft. There is no mass.     Tenderness: There is no abdominal tenderness. There is no guarding or rebound.     Hernia: No hernia is present.  Musculoskeletal:     Right lower leg: No edema.     Left lower leg: No edema.  Skin:    General: Skin is warm and dry.     Capillary Refill: Capillary refill takes less than 2 seconds.  Neurological:     General: No focal deficit present.     Mental Status: She is alert. Mental status is at baseline.     Motor: Weakness present.     Gait: Gait abnormal.  Psychiatric:        Mood and Affect: Mood normal.        Speech:  Speech is delayed.        Behavior: Behavior normal.        Cognition and Memory: Memory is impaired.     Comments: aphasia    Labs reviewed: Recent Labs    11/24/20 0000 03/19/21 1221 04/09/21 0000  NA 135* 138 134*  K 4.2 3.9 4.2  CL 103 104 105  CO2 24* 26* 22  BUN 21 25* 20  CREATININE 0.7 0.8 0.7  CALCIUM 9.5 9.9 9.3   Recent Labs    11/24/20 0000 03/19/21 1221 04/09/21 0000  AST 19 21 30   ALT 14 15 18   ALKPHOS 57 64 62  ALBUMIN 3.9 4.0 3.5   Recent Labs    11/24/20 0000 03/19/21 1221 04/09/21 0000  WBC 6.4 6.9 5.6  NEUTROABS 4,518.00 5,099.00 3,730.00  HGB 12.9 13.0 11.7*  HCT 38 38 35*  PLT 292 251 237   Lab Results  Component Value Date   TSH 2.703 06/03/2020   Lab Results  Component Value Date   HGBA1C 5.4 03/14/2018   Lab Results  Component Value Date   CHOL 178 01/07/2021   HDL 65 01/07/2021   LDLCALC 88 01/07/2021   TRIG 145 01/07/2021   CHOLHDL 2.6 03/14/2018    Significant Diagnostic Results in last 30 days:  No results found.  Assessment/Plan 1. Anxiety due to dementia [facility]) - still having some behaviors - will increase seroquel to 25 mg qhs - cont skilled nursing   2. Elevated ALT measurement - alkaline phosphatase 156 - ALT 32 - recheck cmp in 2 weeks    Family/ staff Communication: plan discussed with nurse  Labs/tests ordered:  cmp in 2 weeks

## 2021-06-11 ENCOUNTER — Encounter: Payer: Self-pay | Admitting: Internal Medicine

## 2021-06-11 NOTE — Progress Notes (Signed)
Location:   [facility] Room Number: 25 Place of Service:  SNF (31) Provider:  Einar Crow MD  Mast, Man X, NP  Patient Care Team: Mast, Man X, NP as PCP - General (Internal Medicine) Nahser, Deloris Ping, MD as PCP - Cardiology (Cardiology) Venita Lick, MD as Consulting Physician (Orthopedic Surgery) Marykay Lex, MD as Consulting Physician (Cardiology) Mast, Man X, NP as Nurse Practitioner (Internal Medicine)  Extended Emergency Contact Information Primary Emergency Contact: [name],Annakate Address: [address]           [place], [place] [address] [place] of [place] Home Phone: [phone] Mobile Phone: [phone] Relation: Niece Secondary Emergency Contact: [name],[name]  [place] Mobile Phone: [phone] Relation: Niece  Code Status:  DNR Goals of care: Advanced Directive information Advanced Directives 06/11/2021  Does Patient Have a Medical Advance Directive? Yes  Type of Estate agent of [place];Living will;Out of facility DNR (pink MOST or yellow form)  Does patient want to make changes to medical advance directive? No - Patient declined  Copy of Healthcare Power of Attorney in Chart? Yes - validated most recent copy scanned in chart (See row information)  Would patient like information on creating a medical advance directive? -  Pre-existing out of facility DNR order (yellow form or pink MOST form) Yellow form placed in chart (order not valid for inpatient use);Pink MOST form placed in chart (order not valid for inpatient use)     Chief Complaint  Patient presents with   Medical Management of Chronic Issues   Health Maintenance    Shingrix due-Not in [facility]    HPI:  Pt is a 87 y.o. female seen today for medical management of chronic diseases.    She has h/o Hypertension, Hyperlipidemia, Cognitive Impairment, BPPV, Vit D def, Hyponatremia,  Macular degeneration Accelerated Junctional Rhythm  on Cardizem  She is having worsening issues with her Cognition. Getting more agitated in the day time Keep walking around the facility hallway confused Was started on Seroquel to help She had no acute issues Was very anxious today No recent falls Has lost some weight recently    Past Medical History:  Diagnosis Date   Arteriosclerotic cardiovascular disease    Bee sting reaction 08/23/2016   Tongue swelling and difficulty swallowing /notes 08/23/2016   Constipation    Degenerative joint disease of right hip 10/15/14   Fracture of multiple pubic rami (HCC) 10/15/14   Right inferior and superior pubic rami   HOH (hard of hearing)    Wears bilateral hearing aids   Hyperlipidemia    Hypertension    Macular degeneration    Memory deficit    Osteoporosis    Pain in right foot    Palpitations    Paroxysmal atrial fibrillation (HCC)    Thyroid nodule    Status post surgery   Ulcer of hard palate    Vertigo    Past Surgical History:  Procedure Laterality Date   ABDOMINAL HYSTERECTOMY  1968   APPENDECTOMY  1940's   COLONOSCOPY     FOOT SURGERY Left    HARDWARE REMOVAL Right 02/16/2016   Procedure: HARDWARE REMOVAL RT HIP;  Surgeon: Sheral Apley, MD;  Location: [facility] OR;  Service: Orthopedics;  Laterality: Right;   HIP ARTHROPLASTY Right 02/16/2016   Procedure: ARTHROPLASTY BIPOLAR HIP (HEMIARTHROPLASTY);  Surgeon: Sheral Apley, MD;  Location: [facility] OR;  Service: Orthopedics;  Laterality: Right;   INTRAMEDULLARY (IM) NAIL INTERTROCHANTERIC Right 09/11/2015  Procedure: INTRAMEDULLARY (IM) NAIL INTERTROCHANTRIC;  Surgeon: Sheral Apley, MD;  Location: [facility] OR;  Service: Orthopedics;  Laterality: Right;   KNEE SURGERY      No Known Allergies  Allergies as of 06/08/2021   No Known Allergies      Medication List        Accurate as of June 08, 2021 11:59 PM. If you have any questions, ask your nurse or doctor.          aspirin 81 MG chewable tablet Chew 81 mg by  mouth daily.   atorvastatin 10 MG tablet Commonly known as: LIPITOR Take 10 mg by mouth daily.   Biotin 10 MG Tabs Take by mouth daily.   calcium carbonate 1500 (600 Ca) MG Tabs tablet Commonly known as: OSCAL Take 600 mg of elemental calcium by mouth 2 (two) times daily with a meal.   diltiazem 120 MG tablet Commonly known as: CARDIZEM Take 120 mg by mouth daily.   docusate sodium 100 MG capsule Commonly known as: COLACE Take 100 mg by mouth 2 (two) times daily.   EPINEPHrine 0.3 mg/0.3 mL Soaj injection Commonly known as: EPI-PEN Inject 0.3 mLs (0.3 mg total) into the muscle once as needed (anaphylaxis/allergic reaction).   lactose free nutrition Liqd Take 237 mLs by mouth. Once a day at breakfast   meclizine 12.5 MG tablet Commonly known as: ANTIVERT Take 12.5 mg by mouth daily as needed for dizziness.   melatonin 3 MG Tabs tablet Take 3 mg by mouth at bedtime as needed (sleep).   memantine 10 MG tablet Commonly known as: NAMENDA Take 10 mg by mouth 2 (two) times daily.   polyethylene glycol 17 g packet Commonly known as: MIRALAX / GLYCOLAX Take 17 g by mouth every other day.   QUEtiapine 25 MG tablet Commonly known as: SEROQUEL Take 25 mg by mouth at bedtime.   sertraline 50 MG tablet Commonly known as: ZOLOFT Take 50 mg by mouth daily.   Therems-M Tabs Take 1 tablet by mouth daily.   PreserVision AREDS Tabs Take 2 tablets by mouth daily.   Vitamin D3 50 MCG (2000 UT) Tabs Take 2,000 Units by mouth daily.        Review of Systems  Unable to perform ROS: Dementia   Immunization History  Administered Date(s) Administered   Influenza Whole 08/25/2018   Influenza, High Dose Seasonal PF 08/24/2019   Influenza-Unspecified 09/07/2016, 09/12/2017, 09/02/2020   Moderna Sars-Covid-2 Vaccination 11/23/2019, 12/21/2019, 09/29/2020, 04/20/2021   PPD Test 02/18/2016   Pneumococcal Conjugate-13 09/18/2017   Pneumococcal-Unspecified 10/22/2018   Tdap  10/07/2012   Pertinent  Health Maintenance Due  Topic Date Due   INFLUENZA VACCINE  06/21/2021   DEXA SCAN  Completed   PNA vac Low Risk Adult  Completed   Fall Risk  08/17/2018 08/03/2017 10/24/2014  Falls in the past year? No No Yes  Number falls in past yr: - - 1  Injury with Fall? - - Yes  Risk Factor Category  - - High Fall Risk  Risk for fall due to : - - History of fall(s)   Functional Status Survey:    Vitals:   06/11/21 1404  BP: (!) 158/81  Pulse: 77  Resp: 18  Temp: 97.7 F (36.5 C)  SpO2: 95%  Weight: 117 lb 1.6 oz (53.1 kg)  Height: 5\' 1"  (1.549 m)   Body mass index is 22.13 kg/m. Physical Exam Constitutional: . Well-developed and well-nourished.  HENT:  Head: Normocephalic.  Mouth/Throat: Oropharynx is clear and moist.  Eyes: Pupils are equal, round, and reactive to light.  Neck: Neck supple.  Cardiovascular: Normal rate and normal heart sounds.  No murmur heard. Pulmonary/Chest: Effort normal and breath sounds normal. No respiratory distress. No wheezes. She has no rales.  Abdominal: Soft. Bowel sounds are normal. No distension. There is no tenderness. There is no rebound.  Musculoskeletal: No edema.  Lymphadenopathy: none Neurological: No Deficits Walks with her walker Very Anxious today.  Skin: Skin is warm and dry.  Psychiatric: Normal mood and affect. Behavior is normal. Thought content normal.   Labs reviewed: Recent Labs    04/09/21 0000 05/26/21 0000 06/08/21 0000  NA 134* 136* 135*  K 4.2 4.3 4.4  CL 105 102 104  CO2 22 21 23*  BUN 20 18 21   CREATININE 0.7 0.7 0.7  CALCIUM 9.3 9.6 9.5   Recent Labs    03/19/21 1221 04/09/21 0000 06/08/21 0000  AST 21 30 29   ALT 15 18 32  ALKPHOS 64 62 156*  ALBUMIN 4.0 3.5 3.9   Recent Labs    03/19/21 1221 04/09/21 0000 06/08/21 0000  WBC 6.9 5.6 7.1  NEUTROABS 5,099.00 3,730.00 5,254.00  HGB 13.0 11.7* 12.9  HCT 38 35* 39  PLT 251 237 306   Lab Results  Component Value Date    TSH 2.703 06/03/2020   Lab Results  Component Value Date   HGBA1C 5.4 03/14/2018   Lab Results  Component Value Date   CHOL 178 01/07/2021   HDL 65 01/07/2021   LDLCALC 88 01/07/2021   TRIG 145 01/07/2021   CHOLHDL 2.6 03/14/2018    Significant Diagnostic Results in last 30 days:  No results found.  Assessment/Plan Dementia with behavioral disturbance, unspecified dementia type (HCC) Recent worsening symptoms Started on Seroquel Already on Namenda Mixed hyperlipidemia On Statin Consider stopping if she continues to get worse Age-related osteoporosis without current pathological fracture On Calcium and Vit D Accelerated junctional rhythm Per cardiology On Cardizem Depression On Zoloft started recently   Family/ staff Communication:   Labs/tests ordered:

## 2021-06-14 ENCOUNTER — Non-Acute Institutional Stay (SKILLED_NURSING_FACILITY): Payer: Medicare PPO | Admitting: Nurse Practitioner

## 2021-06-14 ENCOUNTER — Encounter: Payer: Self-pay | Admitting: Nurse Practitioner

## 2021-06-14 DIAGNOSIS — I480 Paroxysmal atrial fibrillation: Secondary | ICD-10-CM | POA: Diagnosis not present

## 2021-06-14 DIAGNOSIS — F0150 Vascular dementia without behavioral disturbance: Secondary | ICD-10-CM

## 2021-06-14 DIAGNOSIS — U071 COVID-19: Secondary | ICD-10-CM

## 2021-06-14 DIAGNOSIS — W19XXXA Unspecified fall, initial encounter: Secondary | ICD-10-CM

## 2021-06-14 DIAGNOSIS — K5909 Other constipation: Secondary | ICD-10-CM

## 2021-06-14 DIAGNOSIS — M810 Age-related osteoporosis without current pathological fracture: Secondary | ICD-10-CM

## 2021-06-14 DIAGNOSIS — E871 Hypo-osmolality and hyponatremia: Secondary | ICD-10-CM

## 2021-06-14 DIAGNOSIS — F0391 Unspecified dementia with behavioral disturbance: Secondary | ICD-10-CM | POA: Diagnosis not present

## 2021-06-14 DIAGNOSIS — I10 Essential (primary) hypertension: Secondary | ICD-10-CM | POA: Diagnosis not present

## 2021-06-14 DIAGNOSIS — F0394 Unspecified dementia, unspecified severity, with anxiety: Secondary | ICD-10-CM

## 2021-06-14 DIAGNOSIS — I959 Hypotension, unspecified: Secondary | ICD-10-CM

## 2021-06-14 DIAGNOSIS — E782 Mixed hyperlipidemia: Secondary | ICD-10-CM | POA: Diagnosis not present

## 2021-06-14 DIAGNOSIS — H8110 Benign paroxysmal vertigo, unspecified ear: Secondary | ICD-10-CM

## 2021-06-14 NOTE — Assessment & Plan Note (Signed)
In setting of frequent falls, HR in 70s bpm, will dc Diltiazem, monitor AP.

## 2021-06-14 NOTE — Progress Notes (Signed)
  Location:   [facility] Room Number: [phone] Place of Service:  SNF (31) Provider:  Merisa Julio X Dvon Jiles, NP  Taziah Difatta X, NP  Patient Care Team: Kynadie Yaun X, NP as PCP - General (Internal Medicine) Nahser, Deloris Ping, MD as PCP - Cardiology (Cardiology) Venita Lick, MD as Consulting Physician (Orthopedic Surgery) Marykay Lex, MD as Consulting Physician (Cardiology) Joice Nazario X, NP as Nurse Practitioner (Internal Medicine)  Extended Emergency Contact Information Primary Emergency Contact: [name],Fatimata Address: [address]           [place], [place] [address] [place] of [place] Home Phone: [phone] Mobile Phone: [phone] Relation: Niece Secondary Emergency Contact: [name],[name]  [place] Mobile Phone: [phone] Relation: Niece  Code Status:  DNR Goals of care: Advanced Directive information Advanced Directives 06/11/2021  Does Patient Have a Medical Advance Directive? Yes  Type of Estate agent of [place];Living will;Out of facility DNR (pink MOST or yellow form)  Does patient want to make changes to medical advance directive? No - Patient declined  Copy of Healthcare Power of Attorney in Chart? Yes - validated most recent copy scanned in chart (See row information)  Would patient like information on creating a medical advance directive? -  Pre-existing out of facility DNR order (yellow form or pink MOST form) Yellow form placed in chart (order not valid for inpatient use);Pink MOST form placed in chart (order not valid for inpatient use)     Chief Complaint  Patient presents with   Acute Visit    Patient presents after a fall    HPI:  Pt is a 87 y.o. female seen today for an acute visit for    Past Medical History:  Diagnosis Date   Arteriosclerotic cardiovascular disease    Bee sting reaction 08/23/2016   Tongue swelling and difficulty swallowing /notes 08/23/2016   Constipation    Degenerative  joint disease of right hip 10/15/14   Fracture of multiple pubic rami (HCC) 10/15/14   Right inferior and superior pubic rami   HOH (hard of hearing)    Wears bilateral hearing aids   Hyperlipidemia    Hypertension    Macular degeneration    Memory deficit    Osteoporosis    Pain in right foot    Palpitations    Paroxysmal atrial fibrillation (HCC)    Thyroid nodule    Status post surgery   Ulcer of hard palate    Vertigo    Past Surgical History:  Procedure Laterality Date   ABDOMINAL HYSTERECTOMY  1968   APPENDECTOMY  1940's   COLONOSCOPY     FOOT SURGERY Left    HARDWARE REMOVAL Right 02/16/2016   Procedure: HARDWARE REMOVAL RT HIP;  Surgeon: Sheral Apley, MD;  Location: [facility] OR;  Service: Orthopedics;  Laterality: Right;   HIP ARTHROPLASTY Right 02/16/2016   Procedure: ARTHROPLASTY BIPOLAR HIP (HEMIARTHROPLASTY);  Surgeon: Sheral Apley, MD;  Location: [facility] OR;  Service: Orthopedics;  Laterality: Right;   INTRAMEDULLARY (IM) NAIL INTERTROCHANTERIC Right 09/11/2015   Procedure: INTRAMEDULLARY (IM) NAIL INTERTROCHANTRIC;  Surgeon: Sheral Apley, MD;  Location: [facility] OR;  Service: Orthopedics;  Laterality: Right;   KNEE SURGERY

## 2021-06-14 NOTE — Assessment & Plan Note (Signed)
all reported 06/13/21 when the patient was observed on the floor in front of recliner, resulted in right mid shin skin tear. The patient was noted to have low Bp 85/54.

## 2021-06-14 NOTE — Progress Notes (Signed)
Location:   [facility] Room Number: N025 Place of Service:  SNF (31) Provider: Arna Snipe Rollie Hynek NP  Elber Galyean X, NP  Patient Care Team: Kirtis Challis X, NP as PCP - General (Internal Medicine) Nahser, Deloris Ping, MD as PCP - Cardiology (Cardiology) Venita Lick, MD as Consulting Physician (Orthopedic Surgery) Marykay Lex, MD as Consulting Physician (Cardiology) Lilyanne Mcquown X, NP as Nurse Practitioner (Internal Medicine)  Extended Emergency Contact Information Primary Emergency Contact: [name],Virda Address: [address]           [place], [place] [address] [place] Home Phone: [phone] Mobile Phone: [phone] Relation: Niece Secondary Emergency Contact: [name],[name]  [place] Mobile Phone: [phone] Relation: Niece  Code Status: DNR Goals of care: Advanced Directive information Advanced Directives 06/14/2021  Does Patient Have a Medical Advance Directive? Yes  Type of Estate agent of [place];Living will;Out of facility DNR (pink MOST or yellow form)  Does patient want to make changes to medical advance directive? No - Patient declined  Copy of Healthcare Power of Attorney in Chart? Yes - validated most recent copy scanned in chart (See row information)  Would patient like information on creating a medical advance directive? -  Pre-existing out of facility DNR order (yellow form or pink MOST form) Yellow form placed in chart (order not valid for inpatient use);Pink MOST form placed in chart (order not valid for inpatient use)     Chief Complaint  Patient presents with  . Acute Visit    Patient presents after a fall    HPI:  Pt is a 87 y.o. female seen today for an acute visit for fall reported 06/13/21 when the patient was observed on the floor in front of recliner, resulted in right mid shin skin tear. The patient was noted to have low Bp 85/54.    COVID infection, s/p Paxilovid             Anxiety, relapsed  after initial improvement,  takes Sertraline, prn Lorazepam. Seroquel was increased to 25mg  qhs 06/10/21             AFib, heart rate is in control, takes Diltiazem, ASA, Hgb 12.9 06/08/21             The patient resides in [facility] for safety, care assistance, on Memantine 10mg  bid for memory.             HTN, blood pressure is low decreased Diltiazem 120mg  03/29/21, ASA 81mg  qd. Bun/creat 21/0.7 06/08/21             Hyperlipidemia, takes Atorvastatin 10mg  qd. LDL 88 01/07/21             OP, takes Ca, Vit D, has been Reclast before.              Constipation, takes Colace bid, MiraLax qod.              Hyponatremia, Na 135 06/08/21             BPPV, takes prn Meclizine   Past Medical History:  Diagnosis Date  . Arteriosclerotic cardiovascular disease   . Bee sting reaction 08/23/2016   Tongue swelling and difficulty swallowing /notes 08/23/2016  . Constipation   . Degenerative joint disease of right hip 10/15/14  . Fracture of multiple pubic rami (HCC) 10/15/14   Right inferior and superior pubic rami  . HOH (hard of hearing)    Wears bilateral hearing  aids  . Hyperlipidemia   . Hypertension   . Macular degeneration   . Memory deficit   . Osteoporosis   . Pain in right foot   . Palpitations   . Paroxysmal atrial fibrillation (HCC)   . Thyroid nodule    Status post surgery  . Ulcer of hard palate   . Vertigo    Past Surgical History:  Procedure Laterality Date  . ABDOMINAL HYSTERECTOMY  1968  . APPENDECTOMY  1940's  . COLONOSCOPY    . FOOT SURGERY Left   . HARDWARE REMOVAL Right 02/16/2016   Procedure: HARDWARE REMOVAL RT HIP;  Surgeon: Sheral Apley, MD;  Location: [facility] OR;  Service: Orthopedics;  Laterality: Right;  . HIP ARTHROPLASTY Right 02/16/2016   Procedure: ARTHROPLASTY BIPOLAR HIP (HEMIARTHROPLASTY);  Surgeon: Sheral Apley, MD;  Location: [facility] OR;  Service: Orthopedics;  Laterality: Right;  . INTRAMEDULLARY (IM) NAIL INTERTROCHANTERIC Right 09/11/2015   Procedure:  INTRAMEDULLARY (IM) NAIL INTERTROCHANTRIC;  Surgeon: Sheral Apley, MD;  Location: [facility] OR;  Service: Orthopedics;  Laterality: Right;  . KNEE SURGERY      No Known Allergies  Allergies as of 06/14/2021   No Known Allergies      Medication List        Accurate as of June 14, 2021 11:59 PM. If you have any questions, ask your nurse or doctor.          aspirin 81 MG chewable tablet Chew 81 mg by mouth daily.   atorvastatin 10 MG tablet Commonly known as: LIPITOR Take 10 mg by mouth daily.   Biotin 10 MG Tabs Take by mouth daily.   calcium carbonate 1500 (600 Ca) MG Tabs tablet Commonly known as: OSCAL Take 600 mg of elemental calcium by mouth 2 (two) times daily with a meal.   diltiazem 120 MG tablet Commonly known as: CARDIZEM Take 120 mg by mouth daily.   docusate sodium 100 MG capsule Commonly known as: COLACE Take 100 mg by mouth 2 (two) times daily.   EPINEPHrine 0.3 mg/0.3 mL Soaj injection Commonly known as: EPI-PEN Inject 0.3 mLs (0.3 mg total) into the muscle once as needed (anaphylaxis/allergic reaction).   lactose free nutrition Liqd Take 237 mLs by mouth. Once a day at breakfast   meclizine 12.5 MG tablet Commonly known as: ANTIVERT Take 12.5 mg by mouth daily as needed for dizziness.   melatonin 3 MG Tabs tablet Take 3 mg by mouth at bedtime as needed (sleep).   memantine 10 MG tablet Commonly known as: NAMENDA Take 10 mg by mouth 2 (two) times daily.   polyethylene glycol 17 g packet Commonly known as: MIRALAX / GLYCOLAX Take 17 g by mouth every other day.   QUEtiapine 25 MG tablet Commonly known as: SEROQUEL Take 25 mg by mouth at bedtime.   sertraline 50 MG tablet Commonly known as: ZOLOFT Take 50 mg by mouth daily.   Therems-M Tabs Take 1 tablet by mouth daily.   PreserVision AREDS Tabs Take 2 tablets by mouth daily.   Vitamin D3 50 MCG (2000 UT) Tabs Take 2,000 Units by mouth daily.        Review of Systems   Constitutional:  Positive for activity change. Negative for appetite change and fever.       Sleepy  HENT:  Positive for hearing loss and rhinorrhea. Negative for congestion and voice change.        Hears better left ear  Respiratory:  Negative for cough and  shortness of breath.   Cardiovascular:  Positive for leg swelling.  Gastrointestinal:  Negative for abdominal pain and constipation.  Genitourinary:  Negative for dysuria and urgency.  Musculoskeletal:  Positive for gait problem.  Skin:  Positive for wound.  Neurological:  Negative for speech difficulty, weakness and light-headedness.       Memory lapses. Chronic on and off dizziness  Psychiatric/Behavioral:  Positive for agitation, behavioral problems, confusion and sleep disturbance. Negative for hallucinations. The patient is nervous/anxious and is hyperactive.        Seems more sleepy since Seroquel was increased 4 days ago.    Immunization History  Administered Date(s) Administered  . Influenza Whole 08/25/2018  . Influenza, High Dose Seasonal PF 08/24/2019  . Influenza-Unspecified 09/07/2016, 09/12/2017, 09/02/2020  . Moderna Sars-Covid-2 Vaccination 11/23/2019, 12/21/2019, 09/29/2020, 04/20/2021  . PPD Test 02/18/2016  . Pneumococcal Conjugate-13 09/18/2017  . Pneumococcal-Unspecified 10/22/2018  . Tdap 10/07/2012   Pertinent  Health Maintenance Due  Topic Date Due  . INFLUENZA VACCINE  06/21/2021  . DEXA SCAN  Completed  . PNA vac Low Risk Adult  Completed   Fall Risk  08/17/2018 08/03/2017 10/24/2014  Falls in the past year? No No Yes  Number falls in past yr: - - 1  Injury with Fall? - - Yes  Risk Factor Category  - - High Fall Risk  Risk for fall due to : - - History of fall(s)   Functional Status Survey:    Vitals:   06/14/21 1628  BP: 117/76  Pulse: 82  Resp: 18  Temp: 97.9 F (36.6 C)  SpO2: 98%  Weight: 117 lb 1.6 oz (53.1 kg)  Height: 5\' 1"  (1.549 m)   Body mass index is 22.13 kg/m. Physical  Exam Vitals and nursing note reviewed.  Constitutional:      Comments: sleepy  HENT:     Head: Normocephalic and atraumatic.     Mouth/Throat:     Mouth: Mucous membranes are moist.  Eyes:     Extraocular Movements: Extraocular movements intact.     Conjunctiva/sclera: Conjunctivae normal.     Pupils: Pupils are equal, round, and reactive to light.  Cardiovascular:     Rate and Rhythm: Normal rate and regular rhythm.     Heart sounds: No murmur heard. Pulmonary:     Effort: Pulmonary effort is normal.     Breath sounds: No rales.  Abdominal:     General: Bowel sounds are normal.     Palpations: Abdomen is soft.     Tenderness: no abdominal tenderness  Musculoskeletal:     Cervical back: Normal range of motion and neck supple.     Right lower leg: Edema present.     Left lower leg: No edema.     Comments: Trace swelling RLE  Skin:    General: Skin is warm and dry.     Comments: Right shin skin tear is covered in dressing.   Neurological:     General: No focal deficit present.     Mental Status: She is alert. Mental status is at baseline.     Gait: Gait abnormal.     Comments: Oriented to person  Psychiatric:     Comments: Appears anxious.     Labs reviewed: Recent Labs    04/09/21 0000 05/26/21 0000 06/08/21 0000  NA 134* 136* 135*  K 4.2 4.3 4.4  CL 105 102 104  CO2 22 21 23*  BUN 20 18 21   CREATININE 0.7 0.7 0.7  CALCIUM 9.3 9.6 9.5   Recent Labs    03/19/21 1221 04/09/21 0000 06/08/21 0000  AST 21 30 29   ALT 15 18 32  ALKPHOS 64 62 156*  ALBUMIN 4.0 3.5 3.9   Recent Labs    03/19/21 1221 04/09/21 0000 06/08/21 0000  WBC 6.9 5.6 7.1  NEUTROABS 5,099.00 3,730.00 5,254.00  HGB 13.0 11.7* 12.9  HCT 38 35* 39  PLT 251 237 306   Lab Results  Component Value Date   TSH 2.703 06/03/2020   Lab Results  Component Value Date   HGBA1C 5.4 03/14/2018   Lab Results  Component Value Date   CHOL 178 01/07/2021   HDL 65 01/07/2021   LDLCALC 88  01/07/2021   TRIG 145 01/07/2021   CHOLHDL 2.6 03/14/2018    Significant Diagnostic Results in last 30 days:  No results found.  Assessment/Plan: Fall all reported 06/13/21 when the patient was observed on the floor in front of recliner, resulted in right mid shin skin tear. The patient was noted to have low Bp 85/54.   Hypotension In setting of frequent falls, HR in 70s bpm, will dc Diltiazem, monitor AP.   COVID-19 virus infection COVID infection, s/p Paxilovid  Anxiety due to dementia [facility]) relapsed after initial improvement,  takes Sertraline, prn Lorazepam. Seroquel was increased to 25mg  qhs 06/10/21  PAF (paroxysmal atrial fibrillation) (HCC) heart rate is in control, dc  Diltiazem due to low Bp and falling,  ASA, Hgb 12.9 06/08/21  Vascular dementia [facility]) The patient resides in SNF  [facility] for safety, care assistance, on Memantine 10mg  bid for memory.  Essential hypertension blood pressure is low, dc Diltiazem(decreased Diltiazem 120mg  03/29/21), ASA 81mg  qd. Bun/creat 21/0.7 06/08/21. Will monitor heart rate.   Hyperlipidemia takes Atorvastatin 10mg  qd. LDL 88 01/07/21  Osteoporosis without current pathological fracture takes Ca, Vit D, has been Reclast before.   Chronic constipation takes Colace bid, MiraLax qod.   Hyponatremia Na 135 06/08/21  Benign paroxysmal positional vertigo BPPV, takes prn Meclizine    Family/ staff Communication: plan of care reviewed with the patient and charge nurse.   Labs/tests ordered: none  Time spend 35 minutes.

## 2021-06-15 ENCOUNTER — Encounter: Payer: Self-pay | Admitting: Nurse Practitioner

## 2021-06-15 NOTE — Assessment & Plan Note (Signed)
heart rate is in control, dc  Diltiazem due to low Bp and falling,  ASA, Hgb 12.9 06/08/21

## 2021-06-15 NOTE — Assessment & Plan Note (Signed)
takes Ca, Vit D, has been Reclast before.  °

## 2021-06-15 NOTE — Assessment & Plan Note (Signed)
COVID infection, s/p Paxilovid 

## 2021-06-15 NOTE — Assessment & Plan Note (Signed)
Na 135 06/08/21

## 2021-06-15 NOTE — Assessment & Plan Note (Signed)
relapsed after initial improvement,  takes Sertraline, prn Lorazepam. Seroquel was increased to 25mg  qhs 06/10/21

## 2021-06-15 NOTE — Assessment & Plan Note (Signed)
blood pressure is low, dc Diltiazem(decreased Diltiazem 120mg  03/29/21), ASA 81mg  qd. Bun/creat 21/0.7 06/08/21. Will monitor heart rate.

## 2021-06-15 NOTE — Assessment & Plan Note (Signed)
takes Colace bid, MiraLax qod.  

## 2021-06-15 NOTE — Assessment & Plan Note (Signed)
BPPV, takes prn Meclizine

## 2021-06-15 NOTE — Assessment & Plan Note (Signed)
The patient resides in [facility] for safety, care assistance, on Memantine 10mg bid for memory.   

## 2021-06-15 NOTE — Assessment & Plan Note (Signed)
takes Atorvastatin 10mg qd. LDL 88 01/07/21 °

## 2021-06-17 ENCOUNTER — Encounter: Payer: Self-pay | Admitting: Nurse Practitioner

## 2021-06-17 ENCOUNTER — Non-Acute Institutional Stay (SKILLED_NURSING_FACILITY): Payer: Medicare PPO | Admitting: Nurse Practitioner

## 2021-06-17 DIAGNOSIS — K5909 Other constipation: Secondary | ICD-10-CM

## 2021-06-17 DIAGNOSIS — M810 Age-related osteoporosis without current pathological fracture: Secondary | ICD-10-CM

## 2021-06-17 DIAGNOSIS — F0391 Unspecified dementia with behavioral disturbance: Secondary | ICD-10-CM

## 2021-06-17 DIAGNOSIS — E782 Mixed hyperlipidemia: Secondary | ICD-10-CM

## 2021-06-17 DIAGNOSIS — M25561 Pain in right knee: Secondary | ICD-10-CM | POA: Diagnosis not present

## 2021-06-17 DIAGNOSIS — I10 Essential (primary) hypertension: Secondary | ICD-10-CM | POA: Diagnosis not present

## 2021-06-17 DIAGNOSIS — U071 COVID-19: Secondary | ICD-10-CM

## 2021-06-17 DIAGNOSIS — R296 Repeated falls: Secondary | ICD-10-CM | POA: Diagnosis not present

## 2021-06-17 DIAGNOSIS — H8110 Benign paroxysmal vertigo, unspecified ear: Secondary | ICD-10-CM

## 2021-06-17 DIAGNOSIS — F0394 Unspecified dementia, unspecified severity, with anxiety: Secondary | ICD-10-CM

## 2021-06-17 DIAGNOSIS — I480 Paroxysmal atrial fibrillation: Secondary | ICD-10-CM | POA: Diagnosis not present

## 2021-06-17 DIAGNOSIS — W19XXXA Unspecified fall, initial encounter: Secondary | ICD-10-CM | POA: Diagnosis not present

## 2021-06-17 DIAGNOSIS — F0150 Vascular dementia without behavioral disturbance: Secondary | ICD-10-CM

## 2021-06-17 NOTE — Assessment & Plan Note (Signed)
COVID infection, s/p Paxilovid 

## 2021-06-17 NOTE — Assessment & Plan Note (Signed)
takes Ca, Vit D, has been Reclast before.  °

## 2021-06-17 NOTE — Assessment & Plan Note (Signed)
blood pressure is low, off Diltiazem,  ASA 81mg  qd.

## 2021-06-17 NOTE — Assessment & Plan Note (Signed)
Anxiety, improving, takes Sertraline, prn Lorazepam. Seroquel was increased to 25mg  qhs 06/10/21

## 2021-06-17 NOTE — Assessment & Plan Note (Signed)
takes prn Meclizine °

## 2021-06-17 NOTE — Assessment & Plan Note (Signed)
The patient resides in [facility] for safety, care assistance, on Memantine 10mg bid for memory.   

## 2021-06-17 NOTE — Assessment & Plan Note (Signed)
Stable, takes Colace bid, MiraLax qod.

## 2021-06-17 NOTE — Assessment & Plan Note (Addendum)
requent falls, last fall 06/16/21 when the patient was walking in hallway, resulted R knee pain, swelling, but able to bear weight and walking with walker. The patient's increased frailty, limited safety awareness are contributory. Bp runs low even if off Diltiazem(85/54>>96/73). Close supervision/assistance for safety.

## 2021-06-17 NOTE — Assessment & Plan Note (Signed)
AFib, heart rate is in control, off Diltiazem due to low Bp/falling, ASA, Hgb 12.9 06/08/21

## 2021-06-17 NOTE — Assessment & Plan Note (Signed)
requent falls, last fall 06/16/21 when the patient was walking in hallway, resulted R knee pain, swelling, but able to bear weight and walking with walker. The patient's increased frailty, limited safety awareness are contributory. Bp runs low even if off Diltiazem(85/54>>96/73). X-ray ap/lateral R knee.

## 2021-06-17 NOTE — Assessment & Plan Note (Signed)
takes Atorvastatin 10mg qd. LDL 88 01/07/21 °

## 2021-06-17 NOTE — Progress Notes (Signed)
Location:   [facility] Room Number: [phone] Place of Service:  SNF (31) Provider:  Kieron Kantner X Isayah Ignasiak, NP  Abeer Iversen X, NP  Patient Care Team: Leta Bucklin X, NP as PCP - General (Internal Medicine) Nahser, Deloris Ping, MD as PCP - Cardiology (Cardiology) Venita Lick, MD as Consulting Physician (Orthopedic Surgery) Marykay Lex, MD as Consulting Physician (Cardiology) Eufelia Veno X, NP as Nurse Practitioner (Internal Medicine)  Extended Emergency Contact Information Primary Emergency Contact: [name],Reola Address: [address]           [place], [place] [address] [place] Home Phone: [phone] Mobile Phone: [phone] Relation: Niece Secondary Emergency Contact: [name],[name]  [place] Mobile Phone: [phone] Relation: Significant other  Code Status:  DNR Goals of care: Advanced Directive information Advanced Directives 06/17/2021  Does Patient Have a Medical Advance Directive? Yes  Type of Estate agent of [place];Living will;Out of facility DNR (pink MOST or yellow form)  Does patient want to make changes to medical advance directive? No - Patient declined  Copy of Healthcare Power of Attorney in Chart? Yes - validated most recent copy scanned in chart (See row information)  Would patient like information on creating a medical advance directive? -  Pre-existing out of facility DNR order (yellow form or pink MOST form) Yellow form placed in chart (order not valid for inpatient use);Pink MOST form placed in chart (order not valid for inpatient use)     Chief Complaint  Patient presents with   Acute Visit    Patient complains of right knee pain after a fall.    HPI:  Pt is a 87 y.o. female seen today for an acute visit for frequent falls, last fall 06/16/21 when the patient was walking in hallway, resulted R knee pain, swelling, but able to bear weight and walking with walker. The patient's increased  frailty, limited safety awareness are contributory. Bp runs low even if off Diltiazem(85/54>>96/73).  COVID infection, s/p Paxilovid             Anxiety, improving, takes Sertraline, prn Lorazepam. Seroquel was increased to 25mg  qhs 06/10/21             AFib, heart rate is in control, off Diltiazem due to low Bp/falling, ASA, Hgb 12.9 06/08/21             The patient resides in [facility] for safety, care assistance, on Memantine 10mg  bid for memory.             HTN, blood pressure is low, off Diltiazem,  ASA 81mg  qd.             Hyperlipidemia, takes Atorvastatin 10mg  qd. LDL 88 01/07/21             OP, takes Ca, Vit D, has been Reclast before.              Constipation, takes Colace bid, MiraLax qod.              Hyponatremia, Na 135 06/08/21             BPPV, takes prn Meclizine     Past Medical History:  Diagnosis Date   Arteriosclerotic cardiovascular disease    Bee sting reaction 08/23/2016   Tongue swelling and difficulty swallowing /notes 08/23/2016   Constipation    Degenerative joint disease of right hip 10/15/14   Fracture of multiple pubic rami (HCC) 10/15/14  Right inferior and superior pubic rami   HOH (hard of hearing)    Wears bilateral hearing aids   Hyperlipidemia    Hypertension    Macular degeneration    Memory deficit    Osteoporosis    Pain in right foot    Palpitations    Paroxysmal atrial fibrillation (HCC)    Thyroid nodule    Status post surgery   Ulcer of hard palate    Vertigo    Past Surgical History:  Procedure Laterality Date   ABDOMINAL HYSTERECTOMY  1968   APPENDECTOMY  1940's   COLONOSCOPY     FOOT SURGERY Left    HARDWARE REMOVAL Right 02/16/2016   Procedure: HARDWARE REMOVAL RT HIP;  Surgeon: Sheral Apleyimothy D Murphy, MD;  Location: [facility] OR;  Service: Orthopedics;  Laterality: Right;   HIP ARTHROPLASTY Right 02/16/2016   Procedure: ARTHROPLASTY BIPOLAR HIP (HEMIARTHROPLASTY);  Surgeon: Sheral Apleyimothy D Murphy, MD;  [facility];  Service: Orthopedics;   Laterality: Right;   INTRAMEDULLARY (IM) NAIL INTERTROCHANTERIC Right 09/11/2015   Procedure: INTRAMEDULLARY (IM) NAIL INTERTROCHANTRIC;  Surgeon: Sheral Apleyimothy D Murphy, MD;  Location: [facility] OR;  Service: Orthopedics;  Laterality: Right;   KNEE SURGERY      No Known Allergies  Allergies as of 06/17/2021   No Known Allergies      Medication List        Accurate as of June 17, 2021 11:59 PM. If you have any questions, ask your nurse or doctor.          STOP taking these medications    diltiazem 120 MG tablet Commonly known as: CARDIZEM Stopped by: Rashmi Tallent X Grover Robinson, NP       TAKE these medications    acetaminophen 325 MG tablet Commonly known as: TYLENOL Take 650 mg by mouth every 4 (four) hours as needed.   aspirin 81 MG chewable tablet Chew 81 mg by mouth daily.   atorvastatin 10 MG tablet Commonly known as: LIPITOR Take 10 mg by mouth daily.   Biotin 10 MG Tabs Take by mouth daily.   calcium carbonate 1500 (600 Ca) MG Tabs tablet Commonly known as: OSCAL Take 600 mg of elemental calcium by mouth 2 (two) times daily with a meal.   docusate sodium 100 MG capsule Commonly known as: COLACE Take 100 mg by mouth 2 (two) times daily.   EPINEPHrine 0.3 mg/0.3 mL Soaj injection Commonly known as: EPI-PEN Inject 0.3 mLs (0.3 mg total) into the muscle once as needed (anaphylaxis/allergic reaction).   lactose free nutrition Liqd Take 237 mLs by mouth. Once a day at breakfast   meclizine 12.5 MG tablet Commonly known as: ANTIVERT Take 12.5 mg by mouth daily as needed for dizziness.   melatonin 3 MG Tabs tablet Take 3 mg by mouth at bedtime as needed (sleep).   memantine 10 MG tablet Commonly known as: NAMENDA Take 10 mg by mouth 2 (two) times daily.   polyethylene glycol 17 g packet Commonly known as: MIRALAX / GLYCOLAX Take 17 g by mouth every other day.   QUEtiapine 25 MG tablet Commonly known as: SEROQUEL Take 25 mg by mouth at bedtime.   sertraline 50 MG  tablet Commonly known as: ZOLOFT Take 50 mg by mouth daily.   Therems-M Tabs Take 1 tablet by mouth daily.   PreserVision AREDS Tabs Take 2 tablets by mouth daily.   Vitamin D3 50 MCG (2000 UT) Tabs Take 2,000 Units by mouth daily.  Review of Systems  Constitutional:  Negative for activity change, appetite change and fever.       Sleepy  HENT:  Positive for hearing loss and rhinorrhea. Negative for congestion and voice change.        Hears better left ear  Respiratory:  Negative for shortness of breath.   Cardiovascular:  Positive for leg swelling.  Gastrointestinal:  Negative for abdominal pain and constipation.  Genitourinary:  Negative for dysuria and urgency.  Musculoskeletal:  Positive for gait problem and joint swelling.       Right knee  Skin:  Positive for wound.  Neurological:  Negative for dizziness, speech difficulty, weakness and headaches.       Memory lapses. Chronic on and off dizziness  Psychiatric/Behavioral:  Positive for agitation, behavioral problems, confusion and sleep disturbance. Negative for hallucinations. The patient is nervous/anxious and is hyperactive.        Seems more sleepy since Seroquel was increased 4 days ago.    Immunization History  Administered Date(s) Administered   Influenza Whole 08/25/2018   Influenza, High Dose Seasonal PF 08/24/2019   Influenza-Unspecified 09/07/2016, 09/12/2017, 09/02/2020   Moderna Sars-Covid-2 Vaccination 11/23/2019, 12/21/2019, 09/29/2020, 04/20/2021   PPD Test 02/18/2016   Pneumococcal Conjugate-13 09/18/2017   Pneumococcal-Unspecified 10/22/2018   Tdap 10/07/2012   Pertinent  Health Maintenance Due  Topic Date Due   INFLUENZA VACCINE  06/21/2021   DEXA SCAN  Completed   PNA vac Low Risk Adult  Completed   Fall Risk  08/17/2018 08/03/2017 10/24/2014  Falls in the past year? No No Yes  Number falls in past yr: - - 1  Injury with Fall? - - Yes  Risk Factor Category  - - High Fall Risk  Risk  for fall due to : - - History of fall(s)   Functional Status Survey:    Vitals:   06/17/21 1055  BP: 96/73  Pulse: 81  Resp: 18  Temp: 97.9 F (36.6 C)  SpO2: 96%  Weight: 117 lb 1.6 oz (53.1 kg)  Height: 5\' 1"  (1.549 m)   Body mass index is 22.13 kg/m. Physical Exam Vitals and nursing note reviewed.  Constitutional:      Appearance: Normal appearance.  HENT:     Head: Normocephalic and atraumatic.     Mouth/Throat:     Mouth: Mucous membranes are moist.  Eyes:     Extraocular Movements: Extraocular movements intact.     Conjunctiva/sclera: Conjunctivae normal.     Pupils: Pupils are equal, round, and reactive to light.  Cardiovascular:     Rate and Rhythm: Normal rate and regular rhythm.     Heart sounds: No murmur heard. Pulmonary:     Effort: Pulmonary effort is normal.     Breath sounds: No rales.  Abdominal:     General: Bowel sounds are normal.     Palpations: Abdomen is soft.     Tenderness: There is no abdominal tenderness.  Musculoskeletal:        General: Swelling and signs of injury present.     Cervical back: Normal range of motion and neck supple.     Right lower leg: Edema present.     Left lower leg: No edema.     Comments: Trace swelling RLE. Right knee swelling, mild warmth, c/o pain with weight bearing and movement.   Skin:    General: Skin is warm and dry.     Comments: Right shin skin tear is covered in dressing.   Neurological:  General: No focal deficit present.     Mental Status: She is alert. Mental status is at baseline.     Gait: Gait abnormal.     Comments: Oriented to person  Psychiatric:     Comments: Appears anxious.     Labs reviewed: Recent Labs    04/09/21 0000 05/26/21 0000 06/08/21 0000  NA 134* 136* 135*  K 4.2 4.3 4.4  CL 105 102 104  CO2 22 21 23*  BUN 20 18 21   CREATININE 0.7 0.7 0.7  CALCIUM 9.3 9.6 9.5   Recent Labs    03/19/21 1221 04/09/21 0000 06/08/21 0000  AST 21 30 29   ALT 15 18 32   ALKPHOS 64 62 156*  ALBUMIN 4.0 3.5 3.9   Recent Labs    03/19/21 1221 04/09/21 0000 06/08/21 0000  WBC 6.9 5.6 7.1  NEUTROABS 5,099.00 3,730.00 5,254.00  HGB 13.0 11.7* 12.9  HCT 38 35* 39  PLT 251 237 306   Lab Results  Component Value Date   TSH 2.703 06/03/2020   Lab Results  Component Value Date   HGBA1C 5.4 03/14/2018   Lab Results  Component Value Date   CHOL 178 01/07/2021   HDL 65 01/07/2021   LDLCALC 88 01/07/2021   TRIG 145 01/07/2021   CHOLHDL 2.6 03/14/2018    Significant Diagnostic Results in last 30 days:  OCT, Retina - OU - Both Eyes  Result Date: 06/21/2021 Right Eye Quality was good. Scan locations included subfoveal. Central Foveal Thickness: 558. Progression has worsened. Findings include retinal drusen , choroidal neovascular membrane, cystoid macular edema, abnormal foveal contour, outer retinal atrophy, central retinal atrophy. Left Eye Quality was good. Scan locations included subfoveal. Central Foveal Thickness: 312. Progression has been stable. Findings include abnormal foveal contour, subretinal fluid, pigment epithelial detachment. 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 The patient's increased frailty, limited safety awareness are contributory. Bp runs low even if off Diltiazem(85/54>>96/73). X-ray ap/lateral R knee.   Frequent falls requent falls, last fall 06/16/21 when the patient was walking in hallway, resulted R knee pain, swelling, but able to bear weight and walking with walker. The patient's increased frailty, limited safety awareness are contributory. Bp runs low even if off  Diltiazem(85/54>>96/73). Close supervision/assistance for safety.   COVID-19 virus infection COVID infection, s/p Paxilovid  Anxiety due to dementia [facility]) Anxiety, improving, takes Sertraline, prn Lorazepam. Seroquel was increased to 25mg  qhs 06/10/21  PAF (paroxysmal atrial fibrillation) (HCC)             AFib, heart rate is in control, off Diltiazem due to low Bp/falling, ASA, Hgb 12.9 06/08/21  Vascular dementia [facility]) The patient resides in SNF  [facility] for safety, care assistance, on Memantine 10mg  bid for memory.  Essential hypertension blood pressure is low, off Diltiazem,  ASA 81mg  qd.  Hyperlipidemia takes Atorvastatin 10mg  qd. LDL 88 01/07/21  Osteoporosis without current pathological fracture takes Ca, Vit D, has been Reclast before.   Chronic constipation Stable, takes Colace bid, MiraLax qod.   Benign paroxysmal positional vertigo takes prn Meclizine    Family/ staff Communication: plan of care reviewed with the patient and  charge nurse.   Labs/tests ordered:  X-ray R knee ap/lateral  Time spend 35 minutes.

## 2021-06-21 ENCOUNTER — Encounter (INDEPENDENT_AMBULATORY_CARE_PROVIDER_SITE_OTHER): Payer: Self-pay | Admitting: Ophthalmology

## 2021-06-21 ENCOUNTER — Non-Acute Institutional Stay (SKILLED_NURSING_FACILITY): Payer: Medicare PPO | Admitting: Nurse Practitioner

## 2021-06-21 ENCOUNTER — Encounter: Payer: Self-pay | Admitting: Nurse Practitioner

## 2021-06-21 ENCOUNTER — Ambulatory Visit (INDEPENDENT_AMBULATORY_CARE_PROVIDER_SITE_OTHER): Payer: Medicare PPO | Admitting: Ophthalmology

## 2021-06-21 ENCOUNTER — Other Ambulatory Visit: Payer: Self-pay

## 2021-06-21 DIAGNOSIS — I480 Paroxysmal atrial fibrillation: Secondary | ICD-10-CM | POA: Diagnosis not present

## 2021-06-21 DIAGNOSIS — U071 COVID-19: Secondary | ICD-10-CM | POA: Diagnosis not present

## 2021-06-21 DIAGNOSIS — F0391 Unspecified dementia with behavioral disturbance: Secondary | ICD-10-CM

## 2021-06-21 DIAGNOSIS — H35722 Serous detachment of retinal pigment epithelium, left eye: Secondary | ICD-10-CM | POA: Diagnosis not present

## 2021-06-21 DIAGNOSIS — I10 Essential (primary) hypertension: Secondary | ICD-10-CM | POA: Diagnosis not present

## 2021-06-21 DIAGNOSIS — E782 Mixed hyperlipidemia: Secondary | ICD-10-CM | POA: Diagnosis not present

## 2021-06-21 DIAGNOSIS — L03115 Cellulitis of right lower limb: Secondary | ICD-10-CM | POA: Insufficient documentation

## 2021-06-21 DIAGNOSIS — H353211 Exudative age-related macular degeneration, right eye, with active choroidal neovascularization: Secondary | ICD-10-CM

## 2021-06-21 DIAGNOSIS — M810 Age-related osteoporosis without current pathological fracture: Secondary | ICD-10-CM | POA: Diagnosis not present

## 2021-06-21 DIAGNOSIS — H353212 Exudative age-related macular degeneration, right eye, with inactive choroidal neovascularization: Secondary | ICD-10-CM | POA: Diagnosis not present

## 2021-06-21 DIAGNOSIS — M25561 Pain in right knee: Secondary | ICD-10-CM

## 2021-06-21 DIAGNOSIS — F0394 Unspecified dementia, unspecified severity, with anxiety: Secondary | ICD-10-CM

## 2021-06-21 DIAGNOSIS — K5909 Other constipation: Secondary | ICD-10-CM

## 2021-06-21 DIAGNOSIS — F0150 Vascular dementia without behavioral disturbance: Secondary | ICD-10-CM | POA: Diagnosis not present

## 2021-06-21 DIAGNOSIS — H8110 Benign paroxysmal vertigo, unspecified ear: Secondary | ICD-10-CM

## 2021-06-21 DIAGNOSIS — E871 Hypo-osmolality and hyponatremia: Secondary | ICD-10-CM

## 2021-06-21 DIAGNOSIS — W19XXXA Unspecified fall, initial encounter: Secondary | ICD-10-CM | POA: Diagnosis not present

## 2021-06-21 DIAGNOSIS — R2241 Localized swelling, mass and lump, right lower limb: Secondary | ICD-10-CM | POA: Diagnosis not present

## 2021-06-21 NOTE — Assessment & Plan Note (Signed)
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  Will observe no beneficial effect of ongoing treatment.  Moreover the patient is not lucid and not able to sign herself and is here alone today.

## 2021-06-21 NOTE — Assessment & Plan Note (Signed)
heart rate is in control, off Diltiazem due to low Bp/falling, ASA, Hgb 12.9 06/08/21 °

## 2021-06-21 NOTE — Assessment & Plan Note (Signed)
The patient resides in [facility] for safety, care assistance, on Memantine 10mg bid for memory.   

## 2021-06-21 NOTE — Progress Notes (Signed)
06/21/2021     CHIEF COMPLAINT Patient presents for Retina Follow Up (8 week fu OD and Avastin OD/ oct /Patient has trouble hearing and understanding questions. /Patient states her vision is stable. )   HISTORY OF PRESENT ILLNESS: Candice Willis is a 87 y.o. female who presents to the clinic today for:   HPI     Retina Follow Up           Diagnosis: Wet AMD   Laterality: right eye   Onset: 8 weeks ago   Severity: mild   Duration: 8 weeks   Course: stable   Comments: 8 week fu OD and Avastin OD/ oct  Patient has trouble hearing and understanding questions.  Patient states her vision is stable.        Last edited by Nelva NayKronstein, Anna N, COA on 06/21/2021  9:46 AM.      Referring physician: Mast, Man X, NP [address] [place] [id number]  HISTORICAL INFORMATION:   Selected notes from the MEDICAL RECORD NUMBER    Lab Results  Component Value Date   HGBA1C 5.4 03/14/2018     CURRENT MEDICATIONS: No current outpatient medications on file. (Ophthalmic Drugs)   No current facility-administered medications for this visit. (Ophthalmic Drugs)   Current Outpatient Medications (Other)  Medication Sig   acetaminophen (TYLENOL) 325 MG tablet Take 650 mg by mouth every 4 (four) hours as needed.   aspirin 81 MG chewable tablet Chew 81 mg by mouth daily.   atorvastatin (LIPITOR) 10 MG tablet Take 10 mg by mouth daily.   Biotin 10 MG TABS Take by mouth daily.   calcium carbonate (OSCAL) 1500 (600 Ca) MG TABS tablet Take 600 mg of elemental calcium by mouth 2 (two) times daily with a meal.   Cholecalciferol (VITAMIN D3) 2000 units TABS Take 2,000 Units by mouth daily.   docusate sodium (COLACE) 100 MG capsule Take 100 mg by mouth 2 (two) times daily.    EPINEPHrine 0.3 mg/0.3 mL IJ SOAJ injection Inject 0.3 mLs (0.3 mg total) into the muscle once as needed (anaphylaxis/allergic reaction).   lactose free nutrition (BOOST) LIQD Take 237 mLs by mouth. Once a day at  breakfast   meclizine (ANTIVERT) 12.5 MG tablet Take 12.5 mg by mouth daily as needed for dizziness.    Melatonin 3 MG TABS Take 3 mg by mouth at bedtime as needed (sleep).   memantine (NAMENDA) 10 MG tablet Take 10 mg by mouth 2 (two) times daily.    Multiple Vitamins-Minerals (PRESERVISION AREDS) TABS Take 2 tablets by mouth daily.   Multiple Vitamins-Minerals (THEREMS-M) TABS Take 1 tablet by mouth daily.   polyethylene glycol (MIRALAX / GLYCOLAX) packet Take 17 g by mouth every other day.   QUEtiapine (SEROQUEL) 25 MG tablet Take 25 mg by mouth at bedtime.   sertraline (ZOLOFT) 50 MG tablet Take 50 mg by mouth daily.   No current facility-administered medications for this visit. (Other)      REVIEW OF SYSTEMS:    ALLERGIES No Known Allergies  PAST MEDICAL HISTORY Past Medical History:  Diagnosis Date   Arteriosclerotic cardiovascular disease    Bee sting reaction 08/23/2016   Tongue swelling and difficulty swallowing /notes 08/23/2016   Constipation    Degenerative joint disease of right hip 10/15/14   Fracture of multiple pubic rami (HCC) 10/15/14   Right inferior and superior pubic rami   HOH (hard of hearing)    Wears bilateral hearing aids  Hyperlipidemia    Hypertension    Macular degeneration    Memory deficit    Osteoporosis    Pain in right foot    Palpitations    Paroxysmal atrial fibrillation (HCC)    Thyroid nodule    Status post surgery   Ulcer of hard palate    Vertigo    Past Surgical History:  Procedure Laterality Date   ABDOMINAL HYSTERECTOMY  1968   APPENDECTOMY  1940's   COLONOSCOPY     FOOT SURGERY Left    HARDWARE REMOVAL Right 02/16/2016   Procedure: HARDWARE REMOVAL RT HIP;  Surgeon: Sheral Apley, MD;  Location: [facility] OR;  Service: Orthopedics;  Laterality: Right;   HIP ARTHROPLASTY Right 02/16/2016   Procedure: ARTHROPLASTY BIPOLAR HIP (HEMIARTHROPLASTY);  Surgeon: Sheral Apley, MD;  Location: [facility] OR;  Service: Orthopedics;   Laterality: Right;   INTRAMEDULLARY (IM) NAIL INTERTROCHANTERIC Right 09/11/2015   Procedure: INTRAMEDULLARY (IM) NAIL INTERTROCHANTRIC;  Surgeon: Sheral Apley, MD;  Location: [facility] OR;  Service: Orthopedics;  Laterality: Right;   KNEE SURGERY      FAMILY HISTORY Family History  Problem Relation Age of Onset   Heart disease Father    Cancer Father        prostate   Cancer Maternal Grandmother        colon   Heart disease Paternal Grandmother    Hyperlipidemia Sister    Cancer Sister        breast   Heart disease Brother    Hyperlipidemia Brother    Cancer Brother        prostate    SOCIAL HISTORY Social History   Tobacco Use   Smoking status: Former    Packs/day: 0.50    Types: Cigarettes    Quit date: 10/05/1971    Years since quitting: 49.7   Smokeless tobacco: Never  Vaping Use   Vaping Use: Never used  Substance Use Topics   Alcohol use: Yes    Comment: 1-4 per week; eats 10 raisins daily soaked in Gin   Drug use: No         OPHTHALMIC EXAM:  Base Eye Exam     Visual Acuity (ETDRS)       Right Left   Dist [place] 20/200 20/200   Dist ph [place] NI 20/100 -1         Tonometry (Tonopen, 9:49 AM)       Right Left   Pressure 9 10         Pupils       Pupils Dark Light Shape React APD   Right PERRL 4 4 Round Minimal None   Left PERRL 3 3 Round Minimal None         Visual Fields       Left Right    Full Full         Neuro/Psych     Oriented x3: Yes   Mood/Affect: Normal         Dilation     Right eye: 1.0% Mydriacyl, 2.5% Phenylephrine @ 9:50 AM           Slit Lamp and Fundus Exam     External Exam       Right Left   External Normal Normal         Slit Lamp Exam       Right Left   Lids/Lashes Normal Normal   Conjunctiva/Sclera White and quiet White and quiet   Cornea  Clear Clear   Anterior Chamber Deep and quiet Deep and quiet   Iris Round and reactive Round and reactive   Lens Centered posterior chamber  intraocular lens Centered posterior chamber intraocular lens   Anterior Vitreous Normal Normal         Fundus Exam       Right Left   Posterior Vitreous Central vitreous floaters, Posterior vitreous detachment Posterior vitreous detachmentsurrounds the FAZ   Disc Normal Normal   C/D Ratio 0.1 0.1   Macula Advanced age related macular degeneration, Drusen, no exudates, Macular thickening, Subretinal neovascular membrane, Geographic atrophy , Cystoid macular edema Advanced age related macular degeneration, Drusen, no exudates, Macular thickening, , Choroidal nevus, Geographic atrophy near the FAZ   Vessels Normal Normal   Periphery Normal Normal            IMAGING AND PROCEDURES  Imaging and Procedures for 06/21/21  OCT, Retina - OU - Both Eyes       Right Eye Quality was good. Scan locations included subfoveal. Central Foveal Thickness: 558. Progression has worsened. Findings include retinal drusen , choroidal neovascular membrane, cystoid macular edema, abnormal foveal contour, outer retinal atrophy, central retinal atrophy.   Left Eye Quality was good. Scan locations included subfoveal. Central Foveal Thickness: 312. Progression has been stable. Findings include abnormal foveal contour, subretinal fluid, pigment epithelial detachment.   Notes OD with chronically active CNVM, CME are stable centrally  at the 8-week interval today will observe hereafter   OS with chronic pigment epithelial detachment serous sub-RPE detachment no change for years,  observe             ASSESSMENT/PLAN:  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) CNVM OD with old chronic CME due to markedly geographic atrophy subfoveal location.  Will observe no beneficial effect of ongoing treatment.  Moreover the patient is not lucid and not able to sign herself and is here alone today.  Macular pigment epithelial detachment, left Chronic  active OS, noted no treatment for the last 5 years OS stable condition good acuity will observe     ICD-10-CM   1. 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)  H35.3212     3. Macular pigment epithelial detachment, left  H35.722       1.  Patient here alone, not lucid almost sounds like he has confusion with ability to understand with poor even with poor hearing and unable to lucidly gust condition or reasons for being here.  2.  Observe OU , OS no active disease, and OD with chronic active CNVM in the past now with CME overlying geographic atrophy clinically thus not amenable to ongoing therapy  3.  Ophthalmic Meds Ordered this visit:  No orders of the defined types were placed in this encounter.      Return in about 8 months (around 02/19/2022) for DILATE OU, COLOR FP, OCT.  There are no Patient Instructions on file for this visit.   Explained the diagnoses, plan, and follow up with the patient and they expressed understanding.  Patient expressed understanding of the importance of proper follow up care.   Alford Highland Seif Teichert M.D. Diseases & Surgery of the Retina and Vitreous [facility] 06/21/21     Abbreviations: M myopia (nearsighted); A  astigmatism; H hyperopia (farsighted); P presbyopia; Mrx spectacle prescription;  CTL contact lenses; OD right eye; OS left eye; OU both eyes  XT exotropia; ET esotropia; PEK punctate epithelial keratitis; PEE punctate epithelial erosions; DES dry eye syndrome; MGD meibomian gland dysfunction; ATs artificial tears; PFAT's preservative free artificial tears; NSC nuclear sclerotic cataract; PSC posterior subcapsular cataract; ERM epi-retinal membrane; PVD posterior vitreous detachment; RD retinal detachment; DM diabetes  mellitus; DR diabetic retinopathy; NPDR non-proliferative diabetic retinopathy; PDR proliferative diabetic retinopathy; CSME clinically significant macular edema; DME diabetic macular edema; dbh dot blot hemorrhages; CWS cotton wool spot; POAG primary open angle glaucoma; C/D cup-to-disc ratio; HVF humphrey visual field; GVF goldmann visual field; OCT optical coherence tomography; IOP intraocular pressure; BRVO Branch retinal vein occlusion; CRVO central retinal vein occlusion; CRAO central retinal artery occlusion; BRAO branch retinal artery occlusion; RT retinal tear; SB scleral buckle; PPV pars plana vitrectomy; VH Vitreous hemorrhage; PRP panretinal laser photocoagulation; IVK intravitreal kenalog; VMT vitreomacular traction; MH Macular hole;  NVD neovascularization of the disc; NVE neovascularization elsewhere; AREDS age related eye disease study; ARMD age related macular degeneration; POAG primary open angle glaucoma; EBMD epithelial/anterior basement membrane dystrophy; ACIOL anterior chamber intraocular lens; IOL intraocular lens; PCIOL posterior chamber intraocular lens; Phaco/IOL phacoemulsification with intraocular lens placement; PRK photorefractive keratectomy; LASIK laser assisted in situ keratomileusis; HTN hypertension; DM diabetes mellitus; COPD chronic obstructive pulmonary disease

## 2021-06-21 NOTE — Assessment & Plan Note (Addendum)
redness, warmth, tenderness RLE, s/p fall 06/16/21, resulted in ight knee contusion-swelling, felt fluid,  X-ray 06/17/21 negative fx, R shin skin tear. Able to ambulate with walker, pain palpated superior lateral R knee/distal femur. X-ray R femur, knee, tibia, fibula. [place] R knee to evaluate effusion.

## 2021-06-21 NOTE — Assessment & Plan Note (Signed)
blood pressure is low, off Diltiazem,  ASA 81mg qd. 

## 2021-06-21 NOTE — Assessment & Plan Note (Signed)
takes Colace bid, MiraLax qod.

## 2021-06-21 NOTE — Assessment & Plan Note (Signed)
COVID infection, s/p Paxilovid 

## 2021-06-21 NOTE — Assessment & Plan Note (Signed)
Chronic active OS, noted no treatment for the last 5 years OS stable condition good acuity will observe

## 2021-06-21 NOTE — Assessment & Plan Note (Signed)
redness, warmth, tenderness RLE, s/p fall 06/16/21, resulted in ight knee contusion X-ray, R shin skin tear-well approximated. . Able to ambulate with walker, pain palpated superior lateral R knee/distal femur. US venous RLE to r/o DVT, will treat with Doxycycline 100mg  bid/FloraStor bid x 7 days.

## 2021-06-21 NOTE — Assessment & Plan Note (Signed)
takes Ca, Vit D, has been Reclast before.  °

## 2021-06-21 NOTE — Assessment & Plan Note (Signed)
takes prn Meclizine °

## 2021-06-21 NOTE — Progress Notes (Signed)
Location:   SNF [facility] Room Number: N025 Place of Service:  SNF (31) Provider: Arna Snipe Dorothymae Maciver NP  Kenyada Dosch X, NP  Patient Care Team: Janeah Kovacich X, NP as PCP - General (Internal Medicine) Nahser, Deloris Ping, MD as PCP - Cardiology (Cardiology) Venita Lick, MD as Consulting Physician (Orthopedic Surgery) Marykay Lex, MD as Consulting Physician (Cardiology) Lizzie An X, NP as Nurse Practitioner (Internal Medicine)  Extended Emergency Contact Information Primary Emergency Contact: [name],Suellen Address: [address]           [place], [place] [address] [place] Home Phone: [phone] Mobile Phone: [phone] Relation: Niece Secondary Emergency Contact: [name],[name]  [place] Mobile Phone: [phone] Relation: Significant other  Code Status: DNR Goals of care: Advanced Directive information Advanced Directives 06/21/2021  Does Patient Have a Medical Advance Directive? Yes  Type of Estate agent of [place];Living will;Out of facility DNR (pink MOST or yellow form)  Does patient want to make changes to medical advance directive? No - Patient declined  Copy of Healthcare Power of Attorney in Chart? Yes - validated most recent copy scanned in chart (See row information)  Would patient like information on creating a medical advance directive? -  Pre-existing out of facility DNR order (yellow form or pink MOST form) Yellow form placed in chart (order not valid for inpatient use)     Chief Complaint  Patient presents with   Acute Visit    Patient complains of right lower leg warmth and swelling.      HPI:  Pt is a 87 y.o. female seen today for an acute visit for redness, warmth, tenderness RLE, s/p fall 06/16/21, resulted in ight knee contusion X-ray 06/17/21 negative fx.  R shin skin tear. Able to ambulate with walker, pain palpated superior lateral R knee/distal femur.   COVID infection, s/p Paxilovid              Anxiety, improving, takes Sertraline, prn Lorazepam. Seroquel was increased to 25mg  qhs 06/10/21             AFib, heart rate is in control, off Diltiazem due to low Bp/falling, ASA, Hgb 12.9 06/08/21             The patient resides in [facility] for safety, care assistance, on Memantine 10mg  bid for memory.             HTN, blood pressure is low, off Diltiazem,  ASA 81mg  qd.             Hyperlipidemia, takes Atorvastatin 10mg  qd. LDL 88 01/07/21             OP, takes Ca, Vit D, has been Reclast before.              Constipation, takes Colace bid, MiraLax qod.              Hyponatremia, Na 135 06/08/21             BPPV, takes prn Meclizine    Past Medical History:  Diagnosis Date   Arteriosclerotic cardiovascular disease    Bee sting reaction 08/23/2016   Tongue swelling and difficulty swallowing /notes 08/23/2016   Constipation    Degenerative joint disease of right hip 10/15/14   Fracture of multiple pubic rami (HCC) 10/15/14   Right inferior and superior pubic rami   HOH (hard of hearing)    Wears bilateral hearing aids   Hyperlipidemia  Hypertension    Macular degeneration    Memory deficit    Osteoporosis    Pain in right foot    Palpitations    Paroxysmal atrial fibrillation (HCC)    Thyroid nodule    Status post surgery   Ulcer of hard palate    Vertigo    Past Surgical History:  Procedure Laterality Date   ABDOMINAL HYSTERECTOMY  1968   APPENDECTOMY  1940's   COLONOSCOPY     FOOT SURGERY Left    HARDWARE REMOVAL Right 02/16/2016   Procedure: HARDWARE REMOVAL RT HIP;  Surgeon: Sheral Apley, MD;  Location: [facility] OR;  Service: Orthopedics;  Laterality: Right;   HIP ARTHROPLASTY Right 02/16/2016   Procedure: ARTHROPLASTY BIPOLAR HIP (HEMIARTHROPLASTY);  Surgeon: Sheral Apley, MD;  Location: [facility] OR;  Service: Orthopedics;  Laterality: Right;   INTRAMEDULLARY (IM) NAIL INTERTROCHANTERIC Right 09/11/2015   Procedure: INTRAMEDULLARY (IM) NAIL INTERTROCHANTRIC;  Surgeon:  Sheral Apley, MD;  Location: [facility] OR;  Service: Orthopedics;  Laterality: Right;   KNEE SURGERY      No Known Allergies  Allergies as of 06/21/2021   No Known Allergies      Medication List        Accurate as of June 21, 2021 11:59 PM. If you have any questions, ask your nurse or doctor.          acetaminophen 325 MG tablet Commonly known as: TYLENOL Take 650 mg by mouth every 4 (four) hours as needed.   aspirin 81 MG chewable tablet Chew 81 mg by mouth daily.   atorvastatin 10 MG tablet Commonly known as: LIPITOR Take 10 mg by mouth daily.   Biotin 10 MG Tabs Take by mouth daily.   calcium carbonate 1500 (600 Ca) MG Tabs tablet Commonly known as: OSCAL Take 600 mg of elemental calcium by mouth 2 (two) times daily with a meal.   docusate sodium 100 MG capsule Commonly known as: COLACE Take 100 mg by mouth 2 (two) times daily.   EPINEPHrine 0.3 mg/0.3 mL Soaj injection Commonly known as: EPI-PEN Inject 0.3 mLs (0.3 mg total) into the muscle once as needed (anaphylaxis/allergic reaction).   lactose free nutrition Liqd Take 237 mLs by mouth. Once a day at breakfast   meclizine 12.5 MG tablet Commonly known as: ANTIVERT Take 12.5 mg by mouth daily as needed for dizziness.   melatonin 3 MG Tabs tablet Take 3 mg by mouth at bedtime as needed (sleep).   memantine 10 MG tablet Commonly known as: NAMENDA Take 10 mg by mouth 2 (two) times daily.   polyethylene glycol 17 g packet Commonly known as: MIRALAX / GLYCOLAX Take 17 g by mouth every other day.   QUEtiapine 25 MG tablet Commonly known as: SEROQUEL Take 25 mg by mouth at bedtime.   sertraline 50 MG tablet Commonly known as: ZOLOFT Take 50 mg by mouth daily.   Therems-M Tabs Take 1 tablet by mouth daily.   PreserVision AREDS Tabs Take 2 tablets by mouth daily.   Vitamin D3 50 MCG (2000 UT) Tabs Take 2,000 Units by mouth daily.        Review of Systems  Constitutional:  Negative for  activity change, appetite change and fever.       Sleepy  HENT:  Positive for hearing loss and rhinorrhea. Negative for congestion and voice change.        Hears better left ear  Respiratory:  Negative for shortness of breath.   Cardiovascular:  Positive for leg swelling.  Gastrointestinal:  Negative for abdominal pain and constipation.  Genitourinary:  Negative for dysuria and urgency.  Musculoskeletal:  Positive for gait problem and joint swelling.       Right knee  Skin:  Positive for wound.       R shin  Neurological:  Negative for dizziness, speech difficulty, weakness and headaches.       Memory lapses. Chronic on and off dizziness  Psychiatric/Behavioral:  Positive for agitation, behavioral problems, confusion and sleep disturbance. Negative for hallucinations. The patient is nervous/anxious and is hyperactive.        Seems more sleepy since Seroquel was increased 4 days ago.    Immunization History  Administered Date(s) Administered   Influenza Whole 08/25/2018   Influenza, High Dose Seasonal PF 08/24/2019   Influenza-Unspecified 09/07/2016, 09/12/2017, 09/02/2020   Moderna Sars-Covid-2 Vaccination 11/23/2019, 12/21/2019, 09/29/2020, 04/20/2021   PPD Test 02/18/2016   Pneumococcal Conjugate-13 09/18/2017   Pneumococcal-Unspecified 10/22/2018   Tdap 10/07/2012   Pertinent  Health Maintenance Due  Topic Date Due   INFLUENZA VACCINE  06/21/2021   DEXA SCAN  Completed   PNA vac Low Risk Adult  Completed   Fall Risk  08/17/2018 08/03/2017 10/24/2014  Falls in the past year? No No Yes  Number falls in past yr: - - 1  Injury with Fall? - - Yes  Risk Factor Category  - - High Fall Risk  Risk for fall due to : - - History of fall(s)   Functional Status Survey:    Vitals:   06/21/21 1627  BP: 104/73  Pulse: 75  Resp: 20  Temp: 98 F (36.7 C)  SpO2: 97%  Weight: 118 lb (53.5 kg)  Height: 5\' 1"  (1.549 m)   Body mass index is 22.3 kg/m. Physical Exam Vitals and  nursing note reviewed.  Constitutional:      Appearance: Normal appearance.  HENT:     Head: Normocephalic and atraumatic.     Mouth/Throat:     Mouth: Mucous membranes are moist.  Eyes:     Extraocular Movements: Extraocular movements intact.     Conjunctiva/sclera: Conjunctivae normal.     Pupils: Pupils are equal, round, and reactive to light.  Cardiovascular:     Rate and Rhythm: Normal rate and regular rhythm.     Heart sounds: No murmur heard. Pulmonary:     Effort: Pulmonary effort is normal.     Breath sounds: No rales.  Abdominal:     General: Bowel sounds are normal.     Palpations: Abdomen is soft.     Tenderness: There is no abdominal tenderness.  Musculoskeletal:        General: Swelling and signs of injury present.     Cervical back: Normal range of motion and neck supple.     Right lower leg: Edema present.     Left lower leg: No edema.     Comments: Trace swelling RLE. Right knee swelling, mild warmth, fluid felt R knee, pain palpated lateral right distal femur and knee.   Skin:    General: Skin is warm and dry.     Findings: Bruising and erythema present.     Comments: Right shin skin tear is well approximated, healing. RLE redness, warmth, tenderness-not in calf, and swelling.   Neurological:     General: No focal deficit present.     Mental Status: She is alert. Mental status is at baseline.     Gait: Gait abnormal.  Comments: Oriented to person  Psychiatric:     Comments: Appears anxious.     Labs reviewed: Recent Labs    04/09/21 0000 05/26/21 0000 06/08/21 0000  NA 134* 136* 135*  K 4.2 4.3 4.4  CL 105 102 104  CO2 22 21 23*  BUN 20 18 21   CREATININE 0.7 0.7 0.7  CALCIUM 9.3 9.6 9.5   Recent Labs    03/19/21 1221 04/09/21 0000 06/08/21 0000  AST 21 30 29   ALT 15 18 32  ALKPHOS 64 62 156*  ALBUMIN 4.0 3.5 3.9   Recent Labs    03/19/21 1221 04/09/21 0000 06/08/21 0000  WBC 6.9 5.6 7.1  NEUTROABS 5,099.00 3,730.00 5,254.00   HGB 13.0 11.7* 12.9  HCT 38 35* 39  PLT 251 237 306   Lab Results  Component Value Date   TSH 2.703 06/03/2020   Lab Results  Component Value Date   HGBA1C 5.4 03/14/2018   Lab Results  Component Value Date   CHOL 178 01/07/2021   HDL 65 01/07/2021   LDLCALC 88 01/07/2021   TRIG 145 01/07/2021   CHOLHDL 2.6 03/14/2018    Significant Diagnostic Results in last 30 days:  OCT, Retina - OU - Both Eyes  Result Date: 06/21/2021 Right Eye Quality was good. Scan locations included subfoveal. Central Foveal Thickness: 558. Progression has worsened. Findings include retinal drusen , choroidal neovascular membrane, cystoid macular edema, abnormal foveal contour, outer retinal atrophy, central retinal atrophy. Left Eye Quality was good. Scan locations included subfoveal. Central Foveal Thickness: 312. Progression has been stable. Findings include abnormal foveal contour, subretinal fluid, pigment epithelial detachment. 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 Able to ambulate with walker, pain palpated superior lateral R knee/distal femur. X-ray R femur, knee, tibia, fibula. US R knee to evaluate effusion.   Cellulitis of right lower leg redness, warmth, tenderness RLE, s/p fall 06/16/21, resulted in ight knee contusion X-ray, R shin skin tear-well approximated. . Able to ambulate with walker, pain palpated superior lateral R knee/distal femur. US venous RLE to r/o DVT, will treat with Doxycycline 100mg  bid/FloraStor bid x 7 days.   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 Seroquel was increased to 25mg  qhs 06/10/21  PAF (paroxysmal atrial fibrillation) (HCC) heart rate is in control, off Diltiazem due to low Bp/falling, ASA, Hgb 12.9 06/08/21  Vascular dementia [facility](HCC) [facility], care assistance, on Memantine 10mg  bid for memory.  Essential hypertension blood pressure is low, off Diltiazem,  ASA 81mg  qd.  Hyperlipidemia takes Atorvastatin 10mg  qd. LDL 88 01/07/21  Osteoporosis without current pathological fracture takes Ca, Vit D, has been Reclast before.   Chronic constipation takes Colace bid, MiraLax qod.   Hyponatremia Na 135 06/08/21  Benign paroxysmal positional vertigo takes prn Meclizine    Family/ staff Communication: plan of care reviewed with the patient and charge nurse.   Labs/tests ordered:  X-ray R femur, Knee, Tibia/Fibula, venous US RLL, US R knee,   Time spend 35 minutes.

## 2021-06-21 NOTE — Assessment & Plan Note (Signed)
Na 135 06/08/21 

## 2021-06-21 NOTE — Assessment & Plan Note (Signed)
improving, takes Sertraline, prn Lorazepam. Seroquel was increased to 25mg  qhs 06/10/21

## 2021-06-21 NOTE — Assessment & Plan Note (Signed)
takes Atorvastatin 10mg qd. LDL 88 01/07/21 °

## 2021-06-22 ENCOUNTER — Encounter: Payer: Self-pay | Admitting: Nurse Practitioner

## 2021-06-23 DIAGNOSIS — R2241 Localized swelling, mass and lump, right lower limb: Secondary | ICD-10-CM | POA: Diagnosis not present

## 2021-06-23 DIAGNOSIS — L539 Erythematous condition, unspecified: Secondary | ICD-10-CM | POA: Diagnosis not present

## 2021-06-24 DIAGNOSIS — M25561 Pain in right knee: Secondary | ICD-10-CM | POA: Diagnosis not present

## 2021-06-24 DIAGNOSIS — S93401A Sprain of unspecified ligament of right ankle, initial encounter: Secondary | ICD-10-CM | POA: Diagnosis not present

## 2021-06-24 DIAGNOSIS — I10 Essential (primary) hypertension: Secondary | ICD-10-CM | POA: Diagnosis not present

## 2021-06-24 DIAGNOSIS — N179 Acute kidney failure, unspecified: Secondary | ICD-10-CM | POA: Diagnosis not present

## 2021-06-24 DIAGNOSIS — I482 Chronic atrial fibrillation, unspecified: Secondary | ICD-10-CM | POA: Diagnosis not present

## 2021-06-25 LAB — HEPATIC FUNCTION PANEL
ALT: 51 — AB (ref 7–35)
AST: 38 — AB (ref 13–35)
Alkaline Phosphatase: 135 — AB (ref 25–125)
Bilirubin, Total: 0.6

## 2021-06-25 LAB — COMPREHENSIVE METABOLIC PANEL
Albumin: 3.5 (ref 3.5–5.0)
Calcium: 9.3 (ref 8.7–10.7)
Globulin: 2.9

## 2021-06-25 LAB — BASIC METABOLIC PANEL
BUN: 22 — AB (ref 4–21)
CO2: 27 — AB (ref 13–22)
Chloride: 103 (ref 99–108)
Creatinine: 0.7 (ref 0.5–1.1)
Glucose: 83
Potassium: 4.2 (ref 3.4–5.3)
Sodium: 136 — AB (ref 137–147)

## 2021-06-28 ENCOUNTER — Encounter: Payer: Self-pay | Admitting: Nurse Practitioner

## 2021-06-28 DIAGNOSIS — R748 Abnormal levels of other serum enzymes: Secondary | ICD-10-CM | POA: Insufficient documentation

## 2021-07-01 ENCOUNTER — Encounter: Payer: Self-pay | Admitting: Nurse Practitioner

## 2021-07-01 ENCOUNTER — Non-Acute Institutional Stay (SKILLED_NURSING_FACILITY): Payer: Medicare PPO | Admitting: Nurse Practitioner

## 2021-07-01 DIAGNOSIS — F0391 Unspecified dementia with behavioral disturbance: Secondary | ICD-10-CM

## 2021-07-01 DIAGNOSIS — I10 Essential (primary) hypertension: Secondary | ICD-10-CM | POA: Diagnosis not present

## 2021-07-01 DIAGNOSIS — K5909 Other constipation: Secondary | ICD-10-CM

## 2021-07-01 DIAGNOSIS — M25561 Pain in right knee: Secondary | ICD-10-CM

## 2021-07-01 DIAGNOSIS — E782 Mixed hyperlipidemia: Secondary | ICD-10-CM | POA: Diagnosis not present

## 2021-07-01 DIAGNOSIS — U071 COVID-19: Secondary | ICD-10-CM

## 2021-07-01 DIAGNOSIS — H8110 Benign paroxysmal vertigo, unspecified ear: Secondary | ICD-10-CM

## 2021-07-01 DIAGNOSIS — I480 Paroxysmal atrial fibrillation: Secondary | ICD-10-CM

## 2021-07-01 DIAGNOSIS — F0394 Unspecified dementia, unspecified severity, with anxiety: Secondary | ICD-10-CM

## 2021-07-01 DIAGNOSIS — E871 Hypo-osmolality and hyponatremia: Secondary | ICD-10-CM

## 2021-07-01 DIAGNOSIS — F0150 Vascular dementia without behavioral disturbance: Secondary | ICD-10-CM | POA: Diagnosis not present

## 2021-07-01 DIAGNOSIS — M810 Age-related osteoporosis without current pathological fracture: Secondary | ICD-10-CM

## 2021-07-01 NOTE — Assessment & Plan Note (Signed)
The patient resides in [facility] for safety, care assistance, on Memantine 10mg bid for memory.   

## 2021-07-01 NOTE — Assessment & Plan Note (Signed)
takes Colace bid, MiraLax qod.  

## 2021-07-01 NOTE — Assessment & Plan Note (Addendum)
Fell 06/16/21, resulted in ight knee contusion X-ray 06/17/21 negative fx.  R shin skin tear-healing. Able to ambulate with walker, improving, refused knee splint prescribed by Ortho. 06/24/21 Ortho R knee/ankle brace, PT.

## 2021-07-01 NOTE — Assessment & Plan Note (Signed)
heart rate is in control, off Diltiazem due to low Bp/falling, ASA, Hgb 12.9 06/08/21 °

## 2021-07-01 NOTE — Assessment & Plan Note (Signed)
,   Na 136 06/25/21

## 2021-07-01 NOTE — Progress Notes (Signed)
Location:   [facility] Room Number: [phone] Place of Service:  SNF (31) Provider:  Saraia Platner X Keary Waterson, NP  Nyja Westbrook X, NP  Patient Care Team: Rileyann Florance X, NP as PCP - General (Internal Medicine) Nahser, Deloris Ping, MD as PCP - Cardiology (Cardiology) Venita Lick, MD as Consulting Physician (Orthopedic Surgery) Marykay Lex, MD as Consulting Physician (Cardiology) Allex Madia X, NP as Nurse Practitioner (Internal Medicine)  Extended Emergency Contact Information Primary Emergency Contact: [name],Kamora Address: [address]           [place], [place] [address] D[place] of [place] Home Phone: [phone] Mobile Phone: [phone] Relation: Niece Secondary Emergency Contact: [name],[name]  [place] Mobile Phone: [phone] Relation: Significant other  Code Status:  DNR Goals of care: Advanced Directive information Advanced Directives 07/01/2021  Does Patient Have a Medical Advance Directive? Yes  Type of Estate agent of [place];Living will;Out of facility DNR (pink MOST or yellow form)  Does patient want to make changes to medical advance directive? No - Patient declined  Copy of Healthcare Power of Attorney in Chart? Yes - validated most recent copy scanned in chart (See row information)  Would patient like information on creating a medical advance directive? -  Pre-existing out of facility DNR order (yellow form or pink MOST form) Yellow form placed in chart (order not valid for inpatient use);Pink MOST form placed in chart (order not valid for inpatient use)     Chief Complaint  Patient presents with   Medical Management of Chronic Issues    Routine follow up    Health Maintenance    Discuss need for shingles vaccine and updated influenza vaccine.     HPI:  Pt is a 87 y.o. female seen today for medical management of chronic diseases.      Fell 06/16/21, resulted in ight knee contusion X-ray 06/17/21 negative fx.   R shin skin tear-healing. Able to ambulate with walker, improving, refused knee splint prescribed by Ortho.              COVID infection, s/p Paxilovid             Anxiety, improving, takes Sertraline, prn Lorazepam. Seroquel was increased to 25mg  qhs 06/10/21             AFib, heart rate is in control, off Diltiazem due to low Bp/falling, ASA, Hgb 12.9 06/08/21             The patient resides in [facility] for safety, care assistance, on Memantine 10mg  bid for memory.             HTN, blood pressure is controlled, off Diltiazem,  ASA 81mg  qd.             Hyperlipidemia, takes Atorvastatin 10mg  qd. LDL 88 01/07/21             OP, takes Ca, Vit D, has been Reclast before.              Constipation, takes Colace bid, MiraLax qod.              Hyponatremia, Na 136 06/25/21             BPPV, takes prn Meclizine    Past Medical History:  Diagnosis Date   Arteriosclerotic cardiovascular disease    Bee sting reaction 08/23/2016   Tongue swelling and difficulty swallowing /notes 08/23/2016   Constipation    Degenerative joint  disease of right hip 10/15/14   Fracture of multiple pubic rami (HCC) 10/15/14   Right inferior and superior pubic rami   HOH (hard of hearing)    Wears bilateral hearing aids   Hyperlipidemia    Hypertension    Macular degeneration    Memory deficit    Osteoporosis    Pain in right foot    Palpitations    Paroxysmal atrial fibrillation (HCC)    Thyroid nodule    Status post surgery   Ulcer of hard palate    Vertigo    Past Surgical History:  Procedure Laterality Date   ABDOMINAL HYSTERECTOMY  1968   APPENDECTOMY  1940's   COLONOSCOPY     FOOT SURGERY Left    HARDWARE REMOVAL Right 02/16/2016   Procedure: HARDWARE REMOVAL RT HIP;  Surgeon: Sheral Apley, MD;  Location: [facility] OR;  Service: Orthopedics;  Laterality: Right;   HIP ARTHROPLASTY Right 02/16/2016   Procedure: ARTHROPLASTY BIPOLAR HIP (HEMIARTHROPLASTY);  Surgeon: Sheral Apley, MD;  Location: [facility] OR;   Service: Orthopedics;  Laterality: Right;   INTRAMEDULLARY (IM) NAIL INTERTROCHANTERIC Right 09/11/2015   Procedure: INTRAMEDULLARY (IM) NAIL INTERTROCHANTRIC;  Surgeon: Sheral Apley, MD;  Location: [facility] OR;  Service: Orthopedics;  Laterality: Right;   KNEE SURGERY      No Known Allergies  Allergies as of 07/01/2021   No Known Allergies      Medication List        Accurate as of July 01, 2021 11:59 PM. If you have any questions, ask your nurse or doctor.          acetaminophen 325 MG tablet Commonly known as: TYLENOL Take 650 mg by mouth every 4 (four) hours as needed.   aspirin 81 MG chewable tablet Chew 81 mg by mouth daily.   atorvastatin 10 MG tablet Commonly known as: LIPITOR Take 10 mg by mouth daily.   Biotin 10 MG Tabs Take by mouth daily.   calcium carbonate 1500 (600 Ca) MG Tabs tablet Commonly known as: OSCAL Take 600 mg of elemental calcium by mouth 2 (two) times daily with a meal.   docusate sodium 100 MG capsule Commonly known as: COLACE Take 100 mg by mouth 2 (two) times daily.   EPINEPHrine 0.3 mg/0.3 mL Soaj injection Commonly known as: EPI-PEN Inject 0.3 mLs (0.3 mg total) into the muscle once as needed (anaphylaxis/allergic reaction).   lactose free nutrition Liqd Take 237 mLs by mouth. Once a day at breakfast   meclizine 12.5 MG tablet Commonly known as: ANTIVERT Take 12.5 mg by mouth daily as needed for dizziness.   melatonin 3 MG Tabs tablet Take 3 mg by mouth at bedtime as needed (sleep).   memantine 10 MG tablet Commonly known as: NAMENDA Take 10 mg by mouth 2 (two) times daily.   polyethylene glycol 17 g packet Commonly known as: MIRALAX / GLYCOLAX Take 17 g by mouth every other day.   QUEtiapine 25 MG tablet Commonly known as: SEROQUEL Take 25 mg by mouth at bedtime.   sertraline 50 MG tablet Commonly known as: ZOLOFT Take 50 mg by mouth daily.   Therems-M Tabs Take 1 tablet by mouth daily.   PreserVision AREDS  Tabs Take 2 tablets by mouth daily.   Vitamin D3 50 MCG (2000 UT) Tabs Take 2,000 Units by mouth daily.        Review of Systems  Constitutional:  Negative for fatigue, fever and unexpected weight change.  Sleepy  HENT:  Positive for hearing loss and rhinorrhea. Negative for congestion and voice change.        Hears better left ear  Respiratory:  Negative for cough and shortness of breath.   Cardiovascular:  Positive for leg swelling.  Gastrointestinal:  Negative for abdominal pain and constipation.  Genitourinary:  Negative for dysuria and urgency.  Musculoskeletal:  Positive for gait problem and joint swelling.       Right knee  Skin:  Positive for wound.       R shin  Neurological:  Negative for speech difficulty, weakness and light-headedness.       Memory lapses. Chronic on and off dizziness  Psychiatric/Behavioral:  Positive for confusion. Negative for behavioral problems and sleep disturbance. The patient is nervous/anxious.        Mood is stabilizing.    Immunization History  Administered Date(s) Administered   Influenza Whole 08/25/2018   Influenza, High Dose Seasonal PF 08/24/2019   Influenza-Unspecified 09/07/2016, 09/12/2017, 09/02/2020   Moderna Sars-Covid-2 Vaccination 11/23/2019, 12/21/2019, 09/29/2020, 04/20/2021   PPD Test 02/18/2016   Pneumococcal Conjugate-13 09/18/2017   Pneumococcal-Unspecified 10/22/2018   Tdap 10/07/2012   Pertinent  Health Maintenance Due  Topic Date Due   INFLUENZA VACCINE  06/21/2021   DEXA SCAN  Completed   PNA vac Low Risk Adult  Completed   Fall Risk  08/17/2018 08/03/2017 10/24/2014  Falls in the past year? No No Yes  Number falls in past yr: - - 1  Injury with Fall? - - Yes  Risk Factor Category  - - High Fall Risk  Risk for fall due to : - - History of fall(s)   Functional Status Survey:    Vitals:   07/01/21 1011  BP: 125/66  Pulse: 74  Resp: 18  Temp: (!) 97.1 F (36.2 C)  SpO2: 97%  Weight: 118 lb  (53.5 kg)  Height: 5\' 1"  (1.549 m)   Body mass index is 22.3 kg/m. Physical Exam Vitals and nursing note reviewed.  Constitutional:      Appearance: Normal appearance.  HENT:     Head: Normocephalic and atraumatic.     Mouth/Throat:     Mouth: Mucous membranes are moist.  Eyes:     Extraocular Movements: Extraocular movements intact.     Conjunctiva/sclera: Conjunctivae normal.     Pupils: Pupils are equal, round, and reactive to light.  Cardiovascular:     Rate and Rhythm: Normal rate and regular rhythm.     Heart sounds: No murmur heard. Pulmonary:     Effort: Pulmonary effort is normal.     Breath sounds: No rales.  Abdominal:     General: Bowel sounds are normal.     Palpations: Abdomen is soft.     Tenderness: There is no abdominal tenderness.  Musculoskeletal:        General: Swelling and signs of injury present.     Cervical back: Normal range of motion and neck supple.     Right lower leg: Edema present.     Left lower leg: No edema.     Comments: R knee contusion/effusion resolving, able to ambulate with walker  Skin:    General: Skin is warm and dry.     Findings: Bruising present.     Comments: Right shin skin tear is well approximated, healing.   Neurological:     General: No focal deficit present.     Mental Status: She is alert. Mental status is at baseline.  Gait: Gait abnormal.     Comments: Oriented to person  Psychiatric:     Comments: Mild anxious facial looks during my examination.     Labs reviewed: Recent Labs    05/26/21 0000 06/08/21 0000 06/25/21 0000  NA 136* 135* 136*  K 4.3 4.4 4.2  CL 102 104 103  CO2 21 23* 27*  BUN 18 21 22*  CREATININE 0.7 0.7 0.7  CALCIUM 9.6 9.5 9.3   Recent Labs    04/09/21 0000 06/08/21 0000 06/25/21 0000  AST 30 29 38*  ALT 18 32 51*  ALKPHOS 62 156* 135*  ALBUMIN 3.5 3.9 3.5   Recent Labs    03/19/21 1221 04/09/21 0000 06/08/21 0000  WBC 6.9 5.6 7.1  NEUTROABS 5,099.00 3,730.00  5,254.00  HGB 13.0 11.7* 12.9  HCT 38 35* 39  PLT 251 237 306   Lab Results  Component Value Date   TSH 2.703 06/03/2020   Lab Results  Component Value Date   HGBA1C 5.4 03/14/2018   Lab Results  Component Value Date   CHOL 178 01/07/2021   HDL 65 01/07/2021   LDLCALC 88 01/07/2021   TRIG 145 01/07/2021   CHOLHDL 2.6 03/14/2018    Significant Diagnostic Results in last 30 days:  OCT, Retina - OU - Both Eyes  Result Date: 06/21/2021 Right Eye Quality was good. Scan locations included subfoveal. Central Foveal Thickness: 558. Progression has worsened. Findings include retinal drusen , choroidal neovascular membrane, cystoid macular edema, abnormal foveal contour, outer retinal atrophy, central retinal atrophy. Left Eye Quality was good. Scan locations included subfoveal. Central Foveal Thickness: 312. Progression has been stable. Findings include abnormal foveal contour, subretinal fluid, pigment epithelial detachment. 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 Seroquel was increased to 25mg  qhs 06/10/21  COVID-19 virus infection s/p Paxilovid  Right knee pain 06/12/21 06/16/21, resulted in ight knee contusion X-ray 06/17/21 negative fx.  R shin skin tear-healing. Able to ambulate with walker, improving, refused knee splint prescribed by Ortho. 06/24/21 Ortho R knee/ankle brace, PT.   Vascular dementia [facility]) The patient resides in SNF  [facility] for safety, care assistance, on Memantine 10mg  bid for memory.  Essential hypertension blood pressure is controlled,  off Diltiazem,  ASA 81mg   qd.  Hyperlipidemia  takes Atorvastatin 10mg  qd. LDL 88 01/07/21  Osteoporosis without current pathological fracture takes Ca, Vit D, has been Reclast before.   Chronic constipation takes Colace bid, MiraLax qod.   Hyponatremia , Na 136 06/25/21  Benign paroxysmal positional vertigo Chronic, prn Meclizine available to her.     Family/ staff Communication: plan of care reviewed with the patient and charge nurse.   Labs/tests ordered:  none  Time spend 35 minutes.

## 2021-07-01 NOTE — Assessment & Plan Note (Signed)
s/p Paxilovid

## 2021-07-01 NOTE — Assessment & Plan Note (Signed)
blood pressure is controlled, off Diltiazem,  ASA 81mg qd. 

## 2021-07-01 NOTE — Assessment & Plan Note (Signed)
improving, takes Sertraline, prn Lorazepam. Seroquel was increased to 25mg qhs 06/10/21 

## 2021-07-01 NOTE — Assessment & Plan Note (Signed)
Chronic, prn Meclizine available to her.

## 2021-07-01 NOTE — Assessment & Plan Note (Signed)
takes Atorvastatin 10mg qd. LDL 88 01/07/21 °

## 2021-07-01 NOTE — Assessment & Plan Note (Signed)
takes Ca, Vit D, has been Reclast before.  °

## 2021-07-02 DIAGNOSIS — F329 Major depressive disorder, single episode, unspecified: Secondary | ICD-10-CM | POA: Diagnosis not present

## 2021-07-02 DIAGNOSIS — F419 Anxiety disorder, unspecified: Secondary | ICD-10-CM | POA: Diagnosis not present

## 2021-07-02 DIAGNOSIS — F0391 Unspecified dementia with behavioral disturbance: Secondary | ICD-10-CM | POA: Diagnosis not present

## 2021-07-06 ENCOUNTER — Encounter: Payer: Self-pay | Admitting: Nurse Practitioner

## 2021-07-06 DIAGNOSIS — N179 Acute kidney failure, unspecified: Secondary | ICD-10-CM | POA: Diagnosis not present

## 2021-07-06 DIAGNOSIS — I10 Essential (primary) hypertension: Secondary | ICD-10-CM | POA: Diagnosis not present

## 2021-07-06 DIAGNOSIS — D509 Iron deficiency anemia, unspecified: Secondary | ICD-10-CM | POA: Diagnosis not present

## 2021-07-06 LAB — BASIC METABOLIC PANEL
BUN: 26 — AB (ref 4–21)
CO2: 26 — AB (ref 13–22)
Chloride: 104 (ref 99–108)
Creatinine: 0.8 (ref 0.5–1.1)
Glucose: 75
Potassium: 4.2 (ref 3.4–5.3)
Sodium: 137 (ref 137–147)

## 2021-07-06 LAB — HEPATIC FUNCTION PANEL
ALT: 34 (ref 7–35)
AST: 29 (ref 13–35)
Alkaline Phosphatase: 98 (ref 25–125)
Bilirubin, Total: 0.6

## 2021-07-06 LAB — COMPREHENSIVE METABOLIC PANEL
Albumin: 3.5 (ref 3.5–5.0)
Calcium: 9.5 (ref 8.7–10.7)
Globulin: 2.7

## 2021-07-09 ENCOUNTER — Non-Acute Institutional Stay (SKILLED_NURSING_FACILITY): Payer: Medicare PPO | Admitting: Orthopedic Surgery

## 2021-07-09 ENCOUNTER — Other Ambulatory Visit: Payer: Self-pay | Admitting: Orthopedic Surgery

## 2021-07-09 ENCOUNTER — Encounter: Payer: Self-pay | Admitting: Orthopedic Surgery

## 2021-07-09 DIAGNOSIS — F0391 Unspecified dementia with behavioral disturbance: Secondary | ICD-10-CM

## 2021-07-09 DIAGNOSIS — F0394 Unspecified dementia, unspecified severity, with anxiety: Secondary | ICD-10-CM

## 2021-07-09 NOTE — Progress Notes (Signed)
Location:   [facility] Room Number: [phone] Place of Service:  SNF (31) Provider:  Dionicia Abler  Mast, Man X, NP  Patient Care Team: Mast, Man X, NP as PCP - General (Internal Medicine) Nahser, Deloris Ping, MD as PCP - Cardiology (Cardiology) Venita Lick, MD as Consulting Physician (Orthopedic Surgery) Marykay Lex, MD as Consulting Physician (Cardiology) Mast, Man X, NP as Nurse Practitioner (Internal Medicine)  Extended Emergency Contact Information Primary Emergency Contact: [name],Deztinee Address: [address]           [place], [place] [address] [place] of [place] Home Phone: [phone] Mobile Phone: [phone] Relation: Niece Secondary Emergency Contact: [name],[name]  [place] Mobile Phone: [phone] Relation: Significant other  Code Status:  DNR Goals of care: Advanced Directive information Advanced Directives 07/09/2021  Does Patient Have a Medical Advance Directive? Yes  Type of Estate agent of [place];Living will;Out of facility DNR (pink MOST or yellow form)  Does patient want to make changes to medical advance directive? No - Patient declined  Copy of Healthcare Power of Attorney in Chart? Yes - validated most recent copy scanned in chart (See row information)  Would patient like information on creating a medical advance directive? -  Pre-existing out of facility DNR order (yellow form or pink MOST form) Yellow form placed in chart (order not valid for inpatient use);Pink MOST form placed in chart (order not valid for inpatient use)     Chief Complaint  Patient presents with   Acute Visit    Patient presents for increased anxiety    HPI:  Pt is a 87 y.o. female seen today for an acute visit for increased anxiety.   She currently resides on the skilled unit at [facility] due to dementia. Past medical history includes: HTN, PAF, interstitial lung disease, constipation, dysphagia,  thyroid nodule, BPPV, anxiety and gait abnormality.   Nursing staff reports she has had periods of crying and exit seeking behavior within the past few days. 07/21 her Seroquel was increased to qhs. She also takes sertraline daily. Today, she appears tearful during our encounter. She is a poor historian due to dementia.   No recent falls or injuries.   Nurse does not report ant other concerns, vitals stable.    Past Medical History:  Diagnosis Date   Arteriosclerotic cardiovascular disease    Bee sting reaction 08/23/2016   Tongue swelling and difficulty swallowing /notes 08/23/2016   Constipation    Degenerative joint disease of right hip 10/15/14   Fracture of multiple pubic rami (HCC) 10/15/14   Right inferior and superior pubic rami   HOH (hard of hearing)    Wears bilateral hearing aids   Hyperlipidemia    Hypertension    Macular degeneration    Memory deficit    Osteoporosis    Pain in right foot    Palpitations    Paroxysmal atrial fibrillation (HCC)    Thyroid nodule    Status post surgery   Ulcer of hard palate    Vertigo    Past Surgical History:  Procedure Laterality Date   ABDOMINAL HYSTERECTOMY  1968   APPENDECTOMY  1940's   COLONOSCOPY     FOOT SURGERY Left    HARDWARE REMOVAL Right 02/16/2016   Procedure: HARDWARE REMOVAL RT HIP;  Surgeon: Sheral Apley, MD;  Location: [facility] OR;  Service: Orthopedics;  Laterality: Right;   HIP ARTHROPLASTY Right 02/16/2016   Procedure: ARTHROPLASTY BIPOLAR HIP (  HEMIARTHROPLASTY);  Surgeon: Sheral Apley, MD;  Location: [facility] OR;  Service: Orthopedics;  Laterality: Right;   INTRAMEDULLARY (IM) NAIL INTERTROCHANTERIC Right 09/11/2015   Procedure: INTRAMEDULLARY (IM) NAIL INTERTROCHANTRIC;  Surgeon: Sheral Apley, MD;  Location: [facility] OR;  Service: Orthopedics;  Laterality: Right;   KNEE SURGERY      No Known Allergies  Allergies as of 07/09/2021   No Known Allergies      Medication List        Accurate as of July 09, 2021  3:18 PM. If you have any questions, ask your nurse or doctor.          STOP taking these medications    acetaminophen 325 MG tablet Commonly known as: TYLENOL Stopped by: Octavia Heir, NP   meclizine 12.5 MG tablet Commonly known as: ANTIVERT Stopped by: Octavia Heir, NP       TAKE these medications    aspirin 81 MG chewable tablet Chew 81 mg by mouth daily.   atorvastatin 10 MG tablet Commonly known as: LIPITOR Take 10 mg by mouth daily.   Biotin 10 MG Tabs Take by mouth daily.   calcium carbonate 1500 (600 Ca) MG Tabs tablet Commonly known as: OSCAL Take 600 mg of elemental calcium by mouth 2 (two) times daily with a meal.   docusate sodium 100 MG capsule Commonly known as: COLACE Take 100 mg by mouth 2 (two) times daily.   EPINEPHrine 0.3 mg/0.3 mL Soaj injection Commonly known as: EPI-PEN Inject 0.3 mLs (0.3 mg total) into the muscle once as needed (anaphylaxis/allergic reaction).   lactose free nutrition Liqd Take 237 mLs by mouth. Once a day at breakfast   melatonin 3 MG Tabs tablet Take 3 mg by mouth at bedtime as needed (sleep).   memantine 10 MG tablet Commonly known as: NAMENDA Take 10 mg by mouth 2 (two) times daily.   polyethylene glycol 17 g packet Commonly known as: MIRALAX / GLYCOLAX Take 17 g by mouth every other day.   QUEtiapine 25 MG tablet Commonly known as: SEROQUEL Take 25 mg by mouth at bedtime.   sertraline 50 MG tablet Commonly known as: ZOLOFT Take 50 mg by mouth daily.   Therems-M Tabs Take 1 tablet by mouth daily.   PreserVision AREDS Tabs Take 2 tablets by mouth daily.   Vitamin D3 50 MCG (2000 UT) Tabs Take 2,000 Units by mouth daily.        Review of Systems  Unable to perform ROS: Dementia   Immunization History  Administered Date(s) Administered   Influenza Whole 08/25/2018   Influenza, High Dose Seasonal PF 08/24/2019   Influenza-Unspecified 09/07/2016, 09/12/2017, 09/02/2020   Moderna  Sars-Covid-2 Vaccination 11/23/2019, 12/21/2019, 09/29/2020, 04/20/2021   PPD Test 02/18/2016   Pneumococcal Conjugate-13 09/18/2017   Pneumococcal-Unspecified 10/22/2018   Tdap 10/07/2012   Pertinent  Health Maintenance Due  Topic Date Due   INFLUENZA VACCINE  06/21/2021   DEXA SCAN  Completed   PNA vac Low Risk Adult  Completed   Fall Risk  08/17/2018 08/03/2017 10/24/2014  Falls in the past year? No No Yes  Number falls in past yr: - - 1  Injury with Fall? - - Yes  Risk Factor Category  - - High Fall Risk  Risk for fall due to : - - History of fall(s)   Functional Status Survey:    Vitals:   07/09/21 1513  BP: 139/83  Pulse: 66  Resp: 18  Temp: (!) 97.5  F (36.4 C)  SpO2: 91%  Weight: 118 lb (53.5 kg)  Height: 5\' 1"  (1.549 m)   Body mass index is 22.3 kg/m. Physical Exam Vitals reviewed.  Constitutional:      General: She is not in acute distress. Cardiovascular:     Rate and Rhythm: Normal rate and regular rhythm.     Pulses: Normal pulses.     Heart sounds: Normal heart sounds.  Pulmonary:     Effort: Pulmonary effort is normal. No respiratory distress.     Breath sounds: Normal breath sounds. No wheezing.  Skin:    General: Skin is warm and dry.  Neurological:     General: No focal deficit present.     Mental Status: She is alert. Mental status is at baseline.     Motor: Weakness present.     Gait: Gait abnormal.  Psychiatric:        Mood and Affect: Mood is anxious. Affect is tearful.        Behavior: Behavior normal.        Cognition and Memory: Memory is impaired.    Labs reviewed: Recent Labs    05/26/21 0000 06/08/21 0000 06/25/21 0000  NA 136* 135* 136*  K 4.3 4.4 4.2  CL 102 104 103  CO2 21 23* 27*  BUN 18 21 22*  CREATININE 0.7 0.7 0.7  CALCIUM 9.6 9.5 9.3   Recent Labs    04/09/21 0000 06/08/21 0000 06/25/21 0000  AST 30 29 38*  ALT 18 32 51*  ALKPHOS 62 156* 135*  ALBUMIN 3.5 3.9 3.5   Recent Labs    03/19/21 1221  04/09/21 0000 06/08/21 0000  WBC 6.9 5.6 7.1  NEUTROABS 5,099.00 3,730.00 5,254.00  HGB 13.0 11.7* 12.9  HCT 38 35* 39  PLT 251 237 306   Lab Results  Component Value Date   TSH 2.703 06/03/2020   Lab Results  Component Value Date   HGBA1C 5.4 03/14/2018   Lab Results  Component Value Date   CHOL 178 01/07/2021   HDL 65 01/07/2021   LDLCALC 88 01/07/2021   TRIG 145 01/07/2021   CHOLHDL 2.6 03/14/2018    Significant Diagnostic Results in last 30 days:  OCT, Retina - OU - Both Eyes  Result Date: 06/21/2021 Right Eye Quality was good. Scan locations included subfoveal. Central Foveal Thickness: 558. Progression has worsened. Findings include retinal drusen , choroidal neovascular membrane, cystoid macular edema, abnormal foveal contour, outer retinal atrophy, central retinal atrophy. Left Eye Quality was good. Scan locations included subfoveal. Central Foveal Thickness: 312. Progression has been stable. Findings include abnormal foveal contour, subretinal fluid, pigment epithelial detachment. 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 Anxiety due to dementia [facility]) - appears tearful today - seroquel increased to 25 mg qhs 07/21 - sertraline 50 mg daily - will start ativan 0.5 mg po daily prn x 14 days - report ativan usage to provider in 14 days    Family/ staff Communication: plan discussed with nurse  Labs/tests ordered:  none

## 2021-07-11 MED ORDER — LORAZEPAM 0.5 MG PO TABS
0.5000 mg | ORAL_TABLET | Freq: Every day | ORAL | 0 refills | Status: DC | PRN
Start: 2021-07-11 — End: 2021-07-16

## 2021-07-12 DIAGNOSIS — M25561 Pain in right knee: Secondary | ICD-10-CM | POA: Diagnosis not present

## 2021-07-12 DIAGNOSIS — M7051 Other bursitis of knee, right knee: Secondary | ICD-10-CM | POA: Diagnosis not present

## 2021-07-16 ENCOUNTER — Non-Acute Institutional Stay (SKILLED_NURSING_FACILITY): Payer: Medicare PPO | Admitting: Nurse Practitioner

## 2021-07-16 ENCOUNTER — Encounter: Payer: Self-pay | Admitting: Nurse Practitioner

## 2021-07-16 DIAGNOSIS — I10 Essential (primary) hypertension: Secondary | ICD-10-CM

## 2021-07-16 DIAGNOSIS — E871 Hypo-osmolality and hyponatremia: Secondary | ICD-10-CM

## 2021-07-16 DIAGNOSIS — E782 Mixed hyperlipidemia: Secondary | ICD-10-CM

## 2021-07-16 DIAGNOSIS — M810 Age-related osteoporosis without current pathological fracture: Secondary | ICD-10-CM | POA: Diagnosis not present

## 2021-07-16 DIAGNOSIS — S2242XA Multiple fractures of ribs, left side, initial encounter for closed fracture: Secondary | ICD-10-CM | POA: Diagnosis not present

## 2021-07-16 DIAGNOSIS — U071 COVID-19: Secondary | ICD-10-CM | POA: Diagnosis not present

## 2021-07-16 DIAGNOSIS — F0391 Unspecified dementia with behavioral disturbance: Secondary | ICD-10-CM

## 2021-07-16 DIAGNOSIS — R296 Repeated falls: Secondary | ICD-10-CM

## 2021-07-16 DIAGNOSIS — I480 Paroxysmal atrial fibrillation: Secondary | ICD-10-CM

## 2021-07-16 DIAGNOSIS — K5909 Other constipation: Secondary | ICD-10-CM

## 2021-07-16 DIAGNOSIS — R748 Abnormal levels of other serum enzymes: Secondary | ICD-10-CM

## 2021-07-16 DIAGNOSIS — F0150 Vascular dementia without behavioral disturbance: Secondary | ICD-10-CM | POA: Diagnosis not present

## 2021-07-16 DIAGNOSIS — H8110 Benign paroxysmal vertigo, unspecified ear: Secondary | ICD-10-CM

## 2021-07-16 DIAGNOSIS — M25512 Pain in left shoulder: Secondary | ICD-10-CM | POA: Diagnosis not present

## 2021-07-16 DIAGNOSIS — F0394 Unspecified dementia, unspecified severity, with anxiety: Secondary | ICD-10-CM

## 2021-07-16 NOTE — Assessment & Plan Note (Signed)
takes Atorvastatin 10mg qd. LDL 88 01/07/21 °

## 2021-07-16 NOTE — Assessment & Plan Note (Signed)
blood pressure is controlled, off Diltiazem,  ASA 81mg  qd.

## 2021-07-16 NOTE — Progress Notes (Addendum)
Location:   [facility] Room Number: [phone] Place of Service:  SNF (31) Provider:  Berry Gallacher X Arianah Torgeson, NP  Charmeka Freeburg X, NP  Patient Care Team: Ryan Palermo X, NP as PCP - General (Internal Medicine) Nahser, Wonda Cheng, MD as PCP - Cardiology (Cardiology) Melina Schools, MD as Consulting Physician (Orthopedic Surgery) Leonie Karli Wickizer, MD as Consulting Physician (Cardiology) Danali Marinos X, NP as Nurse Practitioner (Internal Medicine)  Extended Emergency Contact Information Primary Emergency Contact: [name],Mabry Address: [address] [place] [address] Johnnette Litter of [place] Phone: [phone] Mobile Phone: [phone] Relation: Niece Secondary Emergency Contact: [name],[name]  [place] Mobile Phone: [phone] Relation: Significant other  Code Status:  DNR Goals of care: Advanced Directive information Advanced Directives 07/16/2021  Does Patient Have a Medical Advance Directive? Yes  Type of Paramedic of [place];Living will;Out of facility DNR (pink MOST or yellow form)  Does patient want to make changes to medical advance directive? No - Patient declined  Copy of [place] in Chart? Yes - validated most recent copy scanned in chart (See row information)  Would patient like information on creating a medical advance directive? -  Pre-existing out of facility DNR order (yellow form or pink MOST form) Yellow form placed in chart (order not valid for inpatient use);Pink MOST form placed in chart (order not valid for inpatient use)     Chief Complaint  Patient presents with   Acute Visit    Patient presents for left shoulder pain    HPI:  Pt is a 87 y.o. female seen today for an acute visit for c/o posterior left shoulder pain palpated,  no decreased ROM.    Frequent falls, last fall 07/05/21. Fall 06/16/21, resulted in ight knee contusion, s/p Ortho, refused splint.              COVID  infection, s/p Paxilovid             Anxiety, increased Sertraline to 75mg  07/15/21 per psych recommendation, Seroquel was increased to 25mg  qhs 06/10/21             AFib, heart rate is in control, off Diltiazem due to low Bp/falling, ASA, Hgb 12.9 06/08/21             The patient resides in [facility] for safety, care assistance, on Memantine 10mg  bid for memory.             HTN, blood pressure is controlled, off Diltiazem,  ASA 81mg  qd.             Hyperlipidemia, takes Atorvastatin 10mg  qd. LDL 88 01/07/21             OP, takes Ca, Vit D, has been Reclast before.              Constipation, takes Colace bid, MiraLax qod.              Hyponatremia, Na 136 06/25/21             BPPV, takes prn Meclizine Past Medical History:  Diagnosis Date   Arteriosclerotic cardiovascular disease    Bee sting reaction 08/23/2016   Tongue swelling and difficulty swallowing /notes 08/23/2016   Constipation    Degenerative joint disease of right hip 10/15/14   Fracture of multiple pubic rami ([place]) 10/15/14   Right inferior and superior pubic rami  HOH (hard of hearing)    Wears bilateral hearing aids   Hyperlipidemia    Hypertension    Macular degeneration    Memory deficit    Osteoporosis    Pain in right foot    Palpitations    Paroxysmal atrial fibrillation (HCC)    Thyroid nodule    Status post surgery   Ulcer of hard palate    Vertigo    Past Surgical History:  Procedure Laterality Date   ABDOMINAL HYSTERECTOMY  1968   APPENDECTOMY  1940's   COLONOSCOPY     FOOT SURGERY Left    HARDWARE REMOVAL Right 02/16/2016   Procedure: HARDWARE REMOVAL RT HIP;  Surgeon: Renette Butters, MD;  Location: [facility];  Service: Orthopedics;  Laterality: Right;   HIP ARTHROPLASTY Right 02/16/2016   Procedure: ARTHROPLASTY BIPOLAR HIP (HEMIARTHROPLASTY);  Surgeon: Renette Butters, MD;  Location: [facility];  Service: Orthopedics;  Laterality: Right;   INTRAMEDULLARY (IM) NAIL INTERTROCHANTERIC Right 09/11/2015    Procedure: INTRAMEDULLARY (IM) NAIL INTERTROCHANTRIC;  Surgeon: Renette Butters, MD;  Location: [facility];  Service: Orthopedics;  Laterality: Right;   KNEE SURGERY      No Known Allergies  Allergies as of 07/16/2021   No Known Allergies      Medication List        Accurate as of July 16, 2021 11:59 PM. If you have any questions, ask your nurse or doctor.          STOP taking these medications    LORazepam 0.5 MG tablet Commonly known as: ATIVAN Stopped by: Averie Hornbaker X Ailana Cuadrado, NP       TAKE these medications    aspirin 81 MG chewable tablet Chew 81 mg by mouth daily.   atorvastatin 10 MG tablet Commonly known as: LIPITOR Take 10 mg by mouth daily.   Biotin 10 MG Tabs Take by mouth daily.   calcium carbonate 1500 (600 Ca) MG Tabs tablet Commonly known as: OSCAL Take 600 mg of elemental calcium by mouth 2 (two) times daily with a meal.   docusate sodium 100 MG capsule Commonly known as: COLACE Take 100 mg by mouth 2 (two) times daily.   EPINEPHrine 0.3 mg/0.3 mL Soaj injection Commonly known as: EPI-PEN Inject 0.3 mLs (0.3 mg total) into the muscle once as needed (anaphylaxis/allergic reaction).   lactose free nutrition Liqd Take 237 mLs by mouth. Once a day at breakfast   melatonin 3 MG Tabs tablet Take 3 mg by mouth at bedtime as needed (sleep).   memantine 10 MG tablet Commonly known as: NAMENDA Take 10 mg by mouth 2 (two) times daily.   polyethylene glycol 17 g packet Commonly known as: MIRALAX / GLYCOLAX Take 17 g by mouth every other day.   QUEtiapine 25 MG tablet Commonly known as: SEROQUEL Take 25 mg by mouth at bedtime.   sertraline 50 MG tablet Commonly known as: ZOLOFT Take 50 mg by mouth daily.   Therems-M Tabs Take 1 tablet by mouth daily.   PreserVision AREDS Tabs Take 2 tablets by mouth daily.   Vitamin D3 50 MCG (2000 UT) Tabs Take 2,000 Units by mouth daily.        Review of Systems  Constitutional:  Negative for activity  change, appetite change, fatigue and fever.  HENT:  Positive for hearing loss. Negative for congestion, rhinorrhea and voice change.        Hears better left ear  Respiratory:  Negative for cough and shortness of  breath.   Cardiovascular:  Positive for leg swelling.  Gastrointestinal:  Negative for abdominal pain and constipation.  Genitourinary:  Negative for dysuria and urgency.  Musculoskeletal:  Positive for arthralgias, gait problem and joint swelling.       Right knee, left shoulder  Skin:  Positive for wound.       R shin  Neurological:  Negative for speech difficulty, weakness and light-headedness.       Memory lapses. Chronic on and off dizziness  Psychiatric/Behavioral:  Positive for confusion. Negative for behavioral problems and sleep disturbance. The patient is nervous/anxious.        Mood is stabilizing.    Immunization History  Administered Date(s) Administered   Influenza Whole 08/25/2018   Influenza, High Dose Seasonal PF 08/24/2019   Influenza-Unspecified 09/07/2016, 09/12/2017, 09/02/2020   Moderna Sars-Covid-2 Vaccination 11/23/2019, 12/21/2019, 09/29/2020, 04/20/2021   PPD Test 02/18/2016   Pneumococcal Conjugate-13 09/18/2017   Pneumococcal-Unspecified 10/22/2018   Tdap 10/07/2012   Pertinent  Health Maintenance Due  Topic Date Due   INFLUENZA VACCINE  06/21/2021   DEXA SCAN  Completed   PNA vac Low Risk Adult  Completed   Fall Risk  08/17/2018 08/03/2017 10/24/2014  Falls in the past year? No No Yes  Number falls in past yr: - - 1  Injury with Fall? - - Yes  Risk Factor Category  - - High Fall Risk  Risk for fall due to : - - History of fall(s)   Functional Status Survey:    Vitals:   07/16/21 1311  BP: 125/70  Pulse: 68  Resp: 18  Temp: (!) 97.2 F (36.2 C)  SpO2: 95%  Weight: 118 lb (53.5 kg)  Height: $Remove'5\' 1"'tygKdcV$  (1.549 m)   Body mass index is 22.3 kg/m. Physical Exam Vitals and nursing note reviewed.  Constitutional:      Appearance: Normal  appearance.  HENT:     Head: Normocephalic and atraumatic.     Mouth/Throat:     Mouth: Mucous membranes are moist.  Eyes:     Extraocular Movements: Extraocular movements intact.     Conjunctiva/sclera: Conjunctivae normal.     Pupils: Pupils are equal, round, and reactive to light.  Cardiovascular:     Rate and Rhythm: Normal rate and regular rhythm.     Heart sounds: No murmur heard. Pulmonary:     Effort: Pulmonary effort is normal.     Breath sounds: No rales.  Abdominal:     General: Bowel sounds are normal.     Palpations: Abdomen is soft.     Tenderness: no abdominal tenderness  Musculoskeletal:        General: Swelling and signs of injury present.     Cervical back: Normal range of motion and neck supple.     Right lower leg: Edema present.     Left lower leg: No edema.     Comments: R knee contusion/effusion resolving, able to ambulate with walker. Posterior left shoulder pian palpated, no decreased ROM or bruise  Skin:    General: Skin is warm and dry.     Findings: No bruising.     Comments: Right shin skin tear is healed.   Neurological:     General: No focal deficit present.     Mental Status: She is alert. Mental status is at baseline.     Gait: Gait abnormal.     Comments: Oriented to person  Psychiatric:     Comments: Mild anxious facial looks during my  examination.     Labs reviewed: Recent Labs    05/26/21 0000 06/08/21 0000 06/25/21 0000  NA 136* 135* 136*  K 4.3 4.4 4.2  CL 102 104 103  CO2 21 23* 27*  BUN 18 21 22*  CREATININE 0.7 0.7 0.7  CALCIUM 9.6 9.5 9.3   Recent Labs    04/09/21 0000 06/08/21 0000 06/25/21 0000  AST 30 29 38*  ALT 18 32 51*  ALKPHOS 62 156* 135*  ALBUMIN 3.5 3.9 3.5   Recent Labs    03/19/21 1221 04/09/21 0000 06/08/21 0000  WBC 6.9 5.6 7.1  NEUTROABS 5,099.00 3,730.00 5,254.00  HGB 13.0 11.7* 12.9  HCT 38 35* 39  PLT 251 237 306   Lab Results  Component Value Date   TSH 2.703 06/03/2020   Lab  Results  Component Value Date   HGBA1C 5.4 03/14/2018   Lab Results  Component Value Date   CHOL 178 01/07/2021   HDL 65 01/07/2021   LDLCALC 88 01/07/2021   TRIG 145 01/07/2021   CHOLHDL 2.6 03/14/2018    Significant Diagnostic Results in last 30 days:  OCT, Retina - OU - Both Eyes  Result Date: 06/21/2021 Right Eye Quality was good. Scan locations included subfoveal. Central Foveal Thickness: 558. Progression has worsened. Findings include retinal drusen , choroidal neovascular membrane, cystoid macular edema, abnormal foveal contour, outer retinal atrophy, central retinal atrophy. Left Eye Quality was good. Scan locations included subfoveal. Central Foveal Thickness: 312. Progression has been stable. Findings include abnormal foveal contour, subretinal fluid, pigment epithelial detachment. 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 Will apply BioFreeze qid to the left shoulder.  07/17/21 X-ray L shoulder no fx, L ribs acute 6th, 7th ribs fractures. Normal scapula.   Frequent falls Increased frailty, balance/gait problem, poor safety awareness are contributory, close supervision/assistance for safety.   COVID-19 virus infection S/p Paxlovid 04/2021  Anxiety due to dementia [facility]) Anxious appearance, increased Sertraline to 75mg  07/15/21 per psych recommendation, Seroquel was increased to 25mg  qhs 06/10/21  PAF (paroxysmal atrial fibrillation) (HCC) eart rate is in control, off Diltiazem due to low Bp/falling, ASA, Hgb 12.9 06/08/21  Vascular dementia [facility]) The patient resides in SNF  [facility] for safety, care assistance, on  Memantine 10mg  bid for memory.  Essential hypertension blood pressure is controlled, off Diltiazem,  ASA 81mg  qd.  Hyperlipidemia takes Atorvastatin 10mg  qd. LDL 88 01/07/21  Osteoporosis without current pathological fracture akes Ca, Vit D, has been Reclast before.   Chronic constipation  takes Colace bid, MiraLax qod.   Hyponatremia Na 135 06/25/21  Benign paroxysmal positional vertigo Chronic, prn Meclizine.   Increased liver enzymes Update CMP/eGFR     Family/ staff Communication: plan of care reviewed with the patient and charge nurse.   Labs/tests ordered:  X-ray left shoulder, scapula, ribs.  CMP/eGFR  Time spend 35 minutes.

## 2021-07-16 NOTE — Assessment & Plan Note (Signed)
Na 135 06/25/21

## 2021-07-16 NOTE — Assessment & Plan Note (Signed)
takes Colace bid, MiraLax qod.  

## 2021-07-16 NOTE — Assessment & Plan Note (Signed)
The patient resides in [facility] for safety, care assistance, on Memantine 10mg bid for memory.   

## 2021-07-16 NOTE — Assessment & Plan Note (Signed)
Anxious appearance, increased Sertraline to 75mg  07/15/21 per psych recommendation, Seroquel was increased to 25mg  qhs 06/10/21

## 2021-07-16 NOTE — Assessment & Plan Note (Addendum)
Posterior left shoulder pain palpated, no decrease of ROM, will X-ray left shoulder, scapula, ribs. Will apply BioFreeze qid to the left shoulder.  07/17/21 X-ray L shoulder no fx, L ribs acute 6th, 7th ribs fractures. Normal scapula.

## 2021-07-16 NOTE — Assessment & Plan Note (Signed)
eart rate is in control, off Diltiazem due to low Bp/falling, ASA, Hgb 12.9 06/08/21

## 2021-07-16 NOTE — Assessment & Plan Note (Signed)
Update CMP/eGFR 

## 2021-07-16 NOTE — Assessment & Plan Note (Signed)
Increased frailty, balance/gait problem, poor safety awareness are contributory, close supervision/assistance for safety.

## 2021-07-16 NOTE — Assessment & Plan Note (Signed)
S/p Paxlovid 04/2021

## 2021-07-16 NOTE — Assessment & Plan Note (Signed)
Chronic, prn Meclizine.

## 2021-07-16 NOTE — Assessment & Plan Note (Signed)
akes Ca, Vit D, has been Reclast before.

## 2021-07-17 DIAGNOSIS — M25512 Pain in left shoulder: Secondary | ICD-10-CM | POA: Diagnosis not present

## 2021-07-17 DIAGNOSIS — S2242XA Multiple fractures of ribs, left side, initial encounter for closed fracture: Secondary | ICD-10-CM | POA: Diagnosis not present

## 2021-07-17 DIAGNOSIS — M19012 Primary osteoarthritis, left shoulder: Secondary | ICD-10-CM | POA: Diagnosis not present

## 2021-07-20 DIAGNOSIS — I10 Essential (primary) hypertension: Secondary | ICD-10-CM | POA: Diagnosis not present

## 2021-07-20 LAB — HEPATIC FUNCTION PANEL
ALT: 21 (ref 7–35)
AST: 24 (ref 13–35)
Alkaline Phosphatase: 81 (ref 25–125)
Bilirubin, Total: 0.6

## 2021-07-20 LAB — COMPREHENSIVE METABOLIC PANEL
Albumin: 3.6 (ref 3.5–5.0)
Calcium: 9.6 (ref 8.7–10.7)
Globulin: 2.8

## 2021-07-20 LAB — BASIC METABOLIC PANEL
BUN: 23 — AB (ref 4–21)
CO2: 26 — AB (ref 13–22)
Chloride: 104 (ref 99–108)
Creatinine: 0.7 (ref 0.5–1.1)
Glucose: 82
Potassium: 4.1 (ref 3.4–5.3)
Sodium: 137 (ref 137–147)

## 2021-08-05 ENCOUNTER — Encounter: Payer: Self-pay | Admitting: Orthopedic Surgery

## 2021-08-05 ENCOUNTER — Non-Acute Institutional Stay (SKILLED_NURSING_FACILITY): Payer: Medicare PPO | Admitting: Orthopedic Surgery

## 2021-08-05 DIAGNOSIS — F0150 Vascular dementia without behavioral disturbance: Secondary | ICD-10-CM

## 2021-08-05 DIAGNOSIS — I10 Essential (primary) hypertension: Secondary | ICD-10-CM

## 2021-08-05 DIAGNOSIS — M810 Age-related osteoporosis without current pathological fracture: Secondary | ICD-10-CM | POA: Diagnosis not present

## 2021-08-05 DIAGNOSIS — F0394 Unspecified dementia, unspecified severity, with anxiety: Secondary | ICD-10-CM

## 2021-08-05 DIAGNOSIS — S8001XD Contusion of right knee, subsequent encounter: Secondary | ICD-10-CM | POA: Diagnosis not present

## 2021-08-05 DIAGNOSIS — E782 Mixed hyperlipidemia: Secondary | ICD-10-CM | POA: Diagnosis not present

## 2021-08-05 DIAGNOSIS — R1310 Dysphagia, unspecified: Secondary | ICD-10-CM

## 2021-08-05 DIAGNOSIS — I480 Paroxysmal atrial fibrillation: Secondary | ICD-10-CM | POA: Diagnosis not present

## 2021-08-05 DIAGNOSIS — S2242XD Multiple fractures of ribs, left side, subsequent encounter for fracture with routine healing: Secondary | ICD-10-CM

## 2021-08-05 DIAGNOSIS — F0391 Unspecified dementia with behavioral disturbance: Secondary | ICD-10-CM

## 2021-08-05 DIAGNOSIS — R296 Repeated falls: Secondary | ICD-10-CM

## 2021-08-05 DIAGNOSIS — K5909 Other constipation: Secondary | ICD-10-CM

## 2021-08-05 NOTE — Progress Notes (Signed)
Location:  [facility] Room Number: [phone] Place of Service:  SNF (31) Provider:  Hazle Nordmann, AGNP-C  Mast, Man X, NP  Patient Care Team: Mast, Man X, NP as PCP - General (Internal Medicine) Nahser, Deloris Ping, MD as PCP - Cardiology (Cardiology) Venita Lick, MD as Consulting Physician (Orthopedic Surgery) Marykay Lex, MD as Consulting Physician (Cardiology) Mast, Man X, NP as Nurse Practitioner (Internal Medicine)  Extended Emergency Contact Information Primary Emergency Contact: [name],Lucine Address: [address]           [place], [place] [address] [place] Home Phone: [phone] Mobile Phone: [phone] Relation: Niece Secondary Emergency Contact: [name],[name]  [place] Mobile Phone: [phone] Relation: Significant other  Code Status:  DNR Goals of care: Advanced Directive information Advanced Directives 07/16/2021  Does Patient Have a Medical Advance Directive? Yes  Type of Estate agent of [place];Living will;Out of facility DNR (pink MOST or yellow form)  Does patient want to make changes to medical advance directive? No - Patient declined  Copy of Healthcare Power of Attorney in Chart? Yes - validated most recent copy scanned in chart (See row information)  Would patient like information on creating a medical advance directive? -  Pre-existing out of facility DNR order (yellow form or pink MOST form) Yellow form placed in chart (order not valid for inpatient use);Pink MOST form placed in chart (order not valid for inpatient use)     Chief Complaint  Patient presents with   Medical Management of Chronic Issues    HPI:  Pt is a 87 y.o. female seen today for medical management of chronic diseases.    She currently resides on the skilled nursing unit at [facility]. Past medical history includes: HTN, afib, interstitial lung disease, constipation, dysphagia, thyroid nodule,  BPPV, osteoporosis, dementia, and anxiety.   Right knee contusion- resulted from fall 07/27, refused splint, finished PT 09/02, last fall 08/15, continues to ambulate with walker Rib fractures- CXR 08/27 revealed fractures to 6th and 7th left anterolateral side, denies pain today, painful facial expression when taking a deep breath Dementia- MMSE 25/30 2020, CT head 2019 revealed generalized brain atrophy and chronic small-vessel ischemic changes to white matter and pons, remains in skilled care due to poor safety, awareness, she is unable to complete sentences today due to aphasia, namenda daily Anxiety- related to dementia, sertraline increased last month to 75 mg and Seroquel decreased to 25 mg qhs per psych Dysphagia- no recent aspirations, remains on regular diet with honey thick liquids HTN- BUN/creat 22/0.7 06/25/2021, remains off diltiazem due to low bp Afib- heart rate controlled without medication, remains off diltiazem due to low bp, remains on aspirin 81 mg for clot prevention HLD- LDL 88 12/2020, Lipitor 10 mg daily Osteoporosis- DEXA 2019, past treatment with reclast, remains on calcium and vitamin d  Constipation- remains on colace daily and miralax QOD  Recent blood pressures:  09/12- 114/66, 122/88  09/05- 114/68  09/01- 108/57  Recent weights:  09/01- 116.2 lbs  08/01- 118 lbs  07/01- 117.1 lbs  She is scheduled to receive flu vaccine and 3rd covid booster 09/20 per [facility].   Nurse does not report any concerns, vital stable.   Past Medical History:  Diagnosis Date   Arteriosclerotic cardiovascular disease    Bee sting reaction 08/23/2016   Tongue swelling and difficulty swallowing /notes 08/23/2016   Constipation    Degenerative joint disease of right hip  10/15/14   Fracture of multiple pubic rami (HCC) 10/15/14   Right inferior and superior pubic rami   HOH (hard of hearing)    Wears bilateral hearing aids   Hyperlipidemia    Hypertension     Macular degeneration    Memory deficit    Osteoporosis    Pain in right foot    Palpitations    Paroxysmal atrial fibrillation (HCC)    Thyroid nodule    Status post surgery   Ulcer of hard palate    Vertigo    Past Surgical History:  Procedure Laterality Date   ABDOMINAL HYSTERECTOMY  1968   APPENDECTOMY  1940's   COLONOSCOPY     FOOT SURGERY Left    HARDWARE REMOVAL Right 02/16/2016   Procedure: HARDWARE REMOVAL RT HIP;  Surgeon: Sheral Apley, MD;  Location: [facility] OR;  Service: Orthopedics;  Laterality: Right;   HIP ARTHROPLASTY Right 02/16/2016   Procedure: ARTHROPLASTY BIPOLAR HIP (HEMIARTHROPLASTY);  Surgeon: Sheral Apley, MD;  Location[facility] OR;  Service: Orthopedics;  Laterality: Right;   INTRAMEDULLARY (IM) NAIL INTERTROCHANTERIC Right 09/11/2015   Procedure: INTRAMEDULLARY (IM) NAIL INTERTROCHANTRIC;  Surgeon: Sheral Apley, MD;  Location: [facility] OR;  Service: Orthopedics;  Laterality: Right;   KNEE SURGERY      No Known Allergies  Outpatient Encounter Medications as of 08/05/2021  Medication Sig   aspirin 81 MG chewable tablet Chew 81 mg by mouth daily.   atorvastatin (LIPITOR) 10 MG tablet Take 10 mg by mouth daily.   Biotin 10 MG TABS Take by mouth daily.   calcium carbonate (OSCAL) 1500 (600 Ca) MG TABS tablet Take 600 mg of elemental calcium by mouth 2 (two) times daily with a meal.   Cholecalciferol (VITAMIN D3) 2000 units TABS Take 2,000 Units by mouth daily.   docusate sodium (COLACE) 100 MG capsule Take 100 mg by mouth 2 (two) times daily.    EPINEPHrine 0.3 mg/0.3 mL IJ SOAJ injection Inject 0.3 mLs (0.3 mg total) into the muscle once as needed (anaphylaxis/allergic reaction).   lactose free nutrition (BOOST) LIQD Take 237 mLs by mouth. Once a day at breakfast   Melatonin 3 MG TABS Take 3 mg by mouth at bedtime as needed (sleep).   memantine (NAMENDA) 10 MG tablet Take 10 mg by mouth 2 (two) times daily.    Multiple Vitamins-Minerals (PRESERVISION AREDS) TABS  Take 2 tablets by mouth daily.   Multiple Vitamins-Minerals (THEREMS-M) TABS Take 1 tablet by mouth daily.   polyethylene glycol (MIRALAX / GLYCOLAX) packet Take 17 g by mouth every other day.   QUEtiapine (SEROQUEL) 25 MG tablet Take 25 mg by mouth at bedtime.   sertraline (ZOLOFT) 50 MG tablet Take 50 mg by mouth daily.   No facility-administered encounter medications on file as of 08/05/2021.    Review of Systems  Unable to perform ROS: Dementia   Immunization History  Administered Date(s) Administered   Influenza Whole 08/25/2018   Influenza, High Dose Seasonal PF 08/24/2019   Influenza-Unspecified 09/07/2016, 09/12/2017, 09/02/2020   Moderna Sars-Covid-2 Vaccination 11/23/2019, 12/21/2019, 09/29/2020, 04/20/2021   PPD Test 02/18/2016   Pneumococcal Conjugate-13 09/18/2017   Pneumococcal-Unspecified 10/22/2018   Tdap 10/07/2012   Pertinent  Health Maintenance Due  Topic Date Due   INFLUENZA VACCINE  06/21/2021   DEXA SCAN  Completed   PNA vac Low Risk Adult  Completed   Fall Risk  08/17/2018 08/03/2017 10/24/2014  Falls in the past year? No No Yes  Number falls  in past yr: - - 1  Injury with Fall? - - Yes  Risk Factor Category  - - High Fall Risk  Risk for fall due to : - - History of fall(s)   Functional Status Survey:    Vitals:   08/05/21 1602  BP: 114/66  Pulse: 66  Resp: 16  Temp: 98.3 F (36.8 C)  SpO2: 98%  Weight: 116 lb 3.2 oz (52.7 kg)  Height: 5\' 1"  (1.549 m)   Body mass index is 21.96 kg/m. Physical Exam Vitals reviewed.  Constitutional:      General: She is not in acute distress. HENT:     Head: Normocephalic.     Right Ear: There is no impacted cerumen.     Left Ear: There is no impacted cerumen.     Nose: Nose normal.     Mouth/Throat:     Mouth: Mucous membranes are moist.  Eyes:     General:        Right eye: No discharge.        Left eye: No discharge.  Neck:     Vascular: No carotid bruit.  Cardiovascular:     Rate and Rhythm:  Normal rate. Rhythm irregular.     Pulses: Normal pulses.     Heart sounds: Normal heart sounds. No murmur heard. Pulmonary:     Effort: Pulmonary effort is normal. No respiratory distress.     Breath sounds: Normal breath sounds. No wheezing.  Chest:     Chest wall: No lacerations, deformity, swelling or tenderness.     Comments: No bruising to left chest, expresses pain when taking deep breath.  Abdominal:     General: Bowel sounds are normal. There is no distension.     Palpations: Abdomen is soft.     Tenderness: There is no abdominal tenderness.  Musculoskeletal:     Cervical back: Normal range of motion.     Right knee: No swelling, effusion or erythema. Normal range of motion. No tenderness.     Right lower leg: No edema.     Left lower leg: No edema.  Lymphadenopathy:     Cervical: No cervical adenopathy.  Skin:    General: Skin is warm and dry.     Capillary Refill: Capillary refill takes less than 2 seconds.  Neurological:     General: No focal deficit present.     Mental Status: She is alert. Mental status is at baseline.     Motor: Weakness present.     Gait: Gait abnormal.     Comments: walker  Psychiatric:        Mood and Affect: Mood normal.        Behavior: Behavior normal.        Cognition and Memory: Memory is impaired.     Comments: aphasia    Labs reviewed: Recent Labs    05/26/21 0000 06/08/21 0000 06/25/21 0000  NA 136* 135* 136*  K 4.3 4.4 4.2  CL 102 104 103  CO2 21 23* 27*  BUN 18 21 22*  CREATININE 0.7 0.7 0.7  CALCIUM 9.6 9.5 9.3   Recent Labs    04/09/21 0000 06/08/21 0000 06/25/21 0000  AST 30 29 38*  ALT 18 32 51*  ALKPHOS 62 156* 135*  ALBUMIN 3.5 3.9 3.5   Recent Labs    03/19/21 1221 04/09/21 0000 06/08/21 0000  WBC 6.9 5.6 7.1  NEUTROABS 5,099.00 3,730.00 5,254.00  HGB 13.0 11.7* 12.9  HCT 38 35*  39  PLT 251 237 306   Lab Results  Component Value Date   TSH 2.703 06/03/2020   Lab Results  Component Value  Date   HGBA1C 5.4 03/14/2018   Lab Results  Component Value Date   CHOL 178 01/07/2021   HDL 65 01/07/2021   LDLCALC 88 01/07/2021   TRIG 145 01/07/2021   CHOLHDL 2.6 03/14/2018    Significant Diagnostic Results in last 30 days:  No results found.  Assessment/Plan 1. Contusion of right knee, subsequent encounter - exam unremarkable - cont to ambulate with walker  2. Closed fracture of multiple ribs of left side with routine healing, subsequent encounter - CXR 08/27 revealed fractures to left 6th and 7th ribs - lung sounds clear in all fields - expresses pain when taking a deep breath otherwise fine  3. Vascular dementia without behavioral disturbance (HCC) - no recent behavioral outbursts - cont skilled nursing care - cont namenda  4. Anxiety due to dementia (HCC) - no recent panic attacks - sertraline increased to 75 mg last month per psych - Seroquel reduced to 25 mg qhs per psych  5. Dysphagia, unspecified type - no recent aspirations - cont regular diet with honey liquids  6. Essential hypertension - controlled without medication - diltiazem discontinued due to low bp  7. PAF (paroxysmal atrial fibrillation) (HCC) - HR controlled without medication - remains on aspirin 81 mg daily  8. Mixed hyperlipidemia - LDL 88 12/2020 - cont Lipitor 10 mg daily  9. Age-related osteoporosis without current pathological fracture - DEXA 2019 - treated with reclast in past - cont calcium and vitamin D  10. Chronic constipation - stable with colace daily and miralax QOD  11. Frequent falls - poor safety awareness due to advanced dementia - PT completed 09/02 - cont skilled nursing care    Family/ staff Communication: plan discussed with nurse  Labs/tests ordered:  none

## 2021-09-02 ENCOUNTER — Non-Acute Institutional Stay (SKILLED_NURSING_FACILITY): Payer: Medicare PPO | Admitting: Nurse Practitioner

## 2021-09-02 ENCOUNTER — Encounter: Payer: Self-pay | Admitting: Nurse Practitioner

## 2021-09-02 DIAGNOSIS — F411 Generalized anxiety disorder: Secondary | ICD-10-CM | POA: Diagnosis not present

## 2021-09-02 DIAGNOSIS — F02C18 Dementia in other diseases classified elsewhere, severe, with other behavioral disturbance: Secondary | ICD-10-CM | POA: Diagnosis not present

## 2021-09-02 DIAGNOSIS — R1310 Dysphagia, unspecified: Secondary | ICD-10-CM

## 2021-09-02 DIAGNOSIS — E871 Hypo-osmolality and hyponatremia: Secondary | ICD-10-CM | POA: Diagnosis not present

## 2021-09-02 DIAGNOSIS — M159 Polyosteoarthritis, unspecified: Secondary | ICD-10-CM | POA: Insufficient documentation

## 2021-09-02 DIAGNOSIS — M810 Age-related osteoporosis without current pathological fracture: Secondary | ICD-10-CM

## 2021-09-02 DIAGNOSIS — R296 Repeated falls: Secondary | ICD-10-CM | POA: Diagnosis not present

## 2021-09-02 DIAGNOSIS — I10 Essential (primary) hypertension: Secondary | ICD-10-CM | POA: Diagnosis not present

## 2021-09-02 DIAGNOSIS — F329 Major depressive disorder, single episode, unspecified: Secondary | ICD-10-CM | POA: Diagnosis not present

## 2021-09-02 DIAGNOSIS — E782 Mixed hyperlipidemia: Secondary | ICD-10-CM | POA: Diagnosis not present

## 2021-09-02 DIAGNOSIS — K5909 Other constipation: Secondary | ICD-10-CM | POA: Diagnosis not present

## 2021-09-02 DIAGNOSIS — F0394 Unspecified dementia, unspecified severity, with anxiety: Secondary | ICD-10-CM

## 2021-09-02 DIAGNOSIS — F320 Major depressive disorder, single episode, mild: Secondary | ICD-10-CM | POA: Diagnosis not present

## 2021-09-02 DIAGNOSIS — F01C4 Vascular dementia, severe, with anxiety: Secondary | ICD-10-CM

## 2021-09-02 DIAGNOSIS — F419 Anxiety disorder, unspecified: Secondary | ICD-10-CM | POA: Diagnosis not present

## 2021-09-02 DIAGNOSIS — I480 Paroxysmal atrial fibrillation: Secondary | ICD-10-CM

## 2021-09-02 DIAGNOSIS — H8110 Benign paroxysmal vertigo, unspecified ear: Secondary | ICD-10-CM | POA: Diagnosis not present

## 2021-09-02 NOTE — Assessment & Plan Note (Signed)
heart rate is in control, off Diltiazem due to low Bp/falling, ASA, Hgb 12.9 06/08/21 °

## 2021-09-02 NOTE — Assessment & Plan Note (Signed)
Runs low, off meds.

## 2021-09-02 NOTE — Assessment & Plan Note (Signed)
risk for aspiration.  °

## 2021-09-02 NOTE — Assessment & Plan Note (Signed)
takes prn Meclizine °

## 2021-09-02 NOTE — Assessment & Plan Note (Signed)
takes Sertraline,  Seroquel

## 2021-09-02 NOTE — Assessment & Plan Note (Signed)
takes Colace bid, MiraLax qod.  

## 2021-09-02 NOTE — Assessment & Plan Note (Signed)
takes Ca, Vit D, has been Reclast before.  °

## 2021-09-02 NOTE — Assessment & Plan Note (Signed)
Na 136 06/25/21 

## 2021-09-02 NOTE — Assessment & Plan Note (Signed)
The patient resides in [facility] for safety, care assistance, on Memantine 10mg bid for memory.   

## 2021-09-02 NOTE — Assessment & Plan Note (Signed)
takes Atorvastatin 10mg qd. LDL 88 01/07/21 °

## 2021-09-02 NOTE — Progress Notes (Signed)
Location:   SNF [facility] Room Number: 25 Place of Service:  SNF (31) Provider: Arna Snipe Emmalou Hunger NP  Cissy Galbreath X, NP  Patient Care Team: Toribio Seiber X, NP as PCP - General (Internal Medicine) Nahser, Deloris Ping, MD as PCP - Cardiology (Cardiology) Venita Lick, MD as Consulting Physician (Orthopedic Surgery) Marykay Lex, MD as Consulting Physician (Cardiology) Lenville Hibberd X, NP as Nurse Practitioner (Internal Medicine)  Extended Emergency Contact Information Primary Emergency Contact: [name],Randall Address: [address]           [place], [place] [address] [place] Home Phone: [phone] Mobile Phone: [phone] Relation: Niece Secondary Emergency Contact: [name],[name]  [place] Mobile Phone: [phone] Relation: Significant other  Code Status:  DNR Goals of care: Advanced Directive information Advanced Directives 07/16/2021  Does Patient Have a Medical Advance Directive? Yes  Type of Estate agent of [place];Living will;Out of facility DNR (pink MOST or yellow form)  Does patient want to make changes to medical advance directive? No - Patient declined  Copy of Healthcare Power of Attorney in Chart? Yes - validated most recent copy scanned in chart (See row information)  Would patient like information on creating a medical advance directive? -  Pre-existing out of facility DNR order (yellow form or pink MOST form) Yellow form placed in chart (order not valid for inpatient use);Pink MOST form placed in chart (order not valid for inpatient use)     Chief Complaint  Patient presents with   Medical Management of Chronic Issues    HPI:  Pt is a 87 y.o. female seen today for medical management of chronic diseases.      Frequent falls, last fall 07/05/21. Fall 06/16/21, resulted in ight knee contusion, s/p Ortho, refused splint.              COVID infection, s/p Paxilovid             Anxiety, takes Sertraline,  Seroquel               AFib, heart rate is in control, off Diltiazem due to low Bp/falling, ASA, Hgb 12.9 06/08/21             The patient resides in [facility] for safety, care assistance, on Memantine 10mg  bid for memory.             HTN, blood pressure is controlled, off Diltiazem,  ASA 81mg  qd.             Hyperlipidemia, takes Atorvastatin 10mg  qd. LDL 88 01/07/21             OP, takes Ca, Vit D, has been Reclast before.              Constipation, takes Colace bid, MiraLax qod.              Hyponatremia, Na 136 06/25/21             BPPV, takes prn Meclizine  OA, s/p right knee contusion, ribs fxs left 6th, 7th.   Dysphagia, risk for aspiration.   Frequent falls, frailty, gait abnormality, poor safety awareness are contributory.   Past Medical History:  Diagnosis Date   Arteriosclerotic cardiovascular disease    Bee sting reaction 08/23/2016   Tongue swelling and difficulty swallowing /notes 08/23/2016   Constipation    Degenerative joint disease of right hip 10/15/14   Fracture of multiple pubic rami (HCC) 10/15/14   Right inferior  and superior pubic rami   HOH (hard of hearing)    Wears bilateral hearing aids   Hyperlipidemia    Hypertension    Macular degeneration    Memory deficit    Osteoporosis    Pain in right foot    Palpitations    Paroxysmal atrial fibrillation (HCC)    Thyroid nodule    Status post surgery   Ulcer of hard palate    Vertigo    Past Surgical History:  Procedure Laterality Date   ABDOMINAL HYSTERECTOMY  1968   APPENDECTOMY  1940's   COLONOSCOPY     FOOT SURGERY Left    HARDWARE REMOVAL Right 02/16/2016   Procedure: HARDWARE REMOVAL RT HIP;  Surgeon: Sheral Apley, MD;  Location: [facility] OR;  Service: Orthopedics;  Laterality: Right;   HIP ARTHROPLASTY Right 02/16/2016   Procedure: ARTHROPLASTY BIPOLAR HIP (HEMIARTHROPLASTY);  Surgeon: Sheral Apley, MD;  Location: [facility] OR;  Service: Orthopedics;  Laterality: Right;   INTRAMEDULLARY (IM) NAIL INTERTROCHANTERIC  Right 09/11/2015   Procedure: INTRAMEDULLARY (IM) NAIL INTERTROCHANTRIC;  Surgeon: Sheral Apley, MD;  Location: [facility] OR;  Service: Orthopedics;  Laterality: Right;   KNEE SURGERY      No Known Allergies  Allergies as of 09/02/2021   No Known Allergies      Medication List        Accurate as of September 02, 2021 11:59 PM. If you have any questions, ask your nurse or doctor.          aspirin 81 MG chewable tablet Chew 81 mg by mouth daily.   atorvastatin 10 MG tablet Commonly known as: LIPITOR Take 10 mg by mouth daily.   Biotin 10 MG Tabs Take by mouth daily.   calcium carbonate 1500 (600 Ca) MG Tabs tablet Commonly known as: OSCAL Take 600 mg of elemental calcium by mouth 2 (two) times daily with a meal.   docusate sodium 100 MG capsule Commonly known as: COLACE Take 100 mg by mouth 2 (two) times daily.   EPINEPHrine 0.3 mg/0.3 mL Soaj injection Commonly known as: EPI-PEN Inject 0.3 mLs (0.3 mg total) into the muscle once as needed (anaphylaxis/allergic reaction).   lactose free nutrition Liqd Take 237 mLs by mouth. Once a day at breakfast   melatonin 3 MG Tabs tablet Take 3 mg by mouth at bedtime as needed (sleep).   memantine 10 MG tablet Commonly known as: NAMENDA Take 10 mg by mouth 2 (two) times daily.   polyethylene glycol 17 g packet Commonly known as: MIRALAX / GLYCOLAX Take 17 g by mouth every other day.   QUEtiapine 25 MG tablet Commonly known as: SEROQUEL Take 25 mg by mouth at bedtime.   sertraline 50 MG tablet Commonly known as: ZOLOFT Take 50 mg by mouth daily.   Therems-M Tabs Take 1 tablet by mouth daily.   PreserVision AREDS Tabs Take 2 tablets by mouth daily.   Vitamin D3 50 MCG (2000 UT) Tabs Take 2,000 Units by mouth daily.        Review of Systems  Constitutional:  Negative for fatigue, fever and unexpected weight change.  HENT:  Positive for hearing loss. Negative for congestion, rhinorrhea and voice change.         Hears better left ear  Respiratory:  Negative for cough.   Cardiovascular:  Negative for leg swelling.  Gastrointestinal:  Negative for abdominal pain and constipation.  Genitourinary:  Negative for dysuria and urgency.  Musculoskeletal:  Positive for arthralgias  and gait problem. Negative for joint swelling.       Right knee, left shoulder  Skin:  Negative for color change.  Neurological:  Negative for speech difficulty, weakness and headaches.       Memory lapses. Chronic on and off dizziness  Psychiatric/Behavioral:  Positive for confusion. Negative for behavioral problems and sleep disturbance. The patient is nervous/anxious.        Mood is stabilizing.    Immunization History  Administered Date(s) Administered   Influenza Whole 08/25/2018   Influenza, High Dose Seasonal PF 08/24/2019   Influenza-Unspecified 09/07/2016, 09/12/2017, 09/02/2020   Moderna Sars-Covid-2 Vaccination 11/23/2019, 12/21/2019, 09/29/2020, 04/20/2021   PPD Test 02/18/2016   Pneumococcal Conjugate-13 09/18/2017   Pneumococcal-Unspecified 10/22/2018   Tdap 10/07/2012   Pertinent  Health Maintenance Due  Topic Date Due   INFLUENZA VACCINE  06/21/2021   DEXA SCAN  Completed   Fall Risk  08/17/2018 08/03/2017 10/24/2014  Falls in the past year? No No Yes  Number falls in past yr: - - 1  Injury with Fall? - - Yes  Risk Factor Category  - - High Fall Risk  Risk for fall due to : - - History of fall(s)   Functional Status Survey:    Vitals:   09/02/21 1228  BP: (!) 101/56  Pulse: 77  Resp: 18  Temp: (!) 97.3 F (36.3 C)  SpO2: 95%  Weight: 117 lb 11.2 oz (53.4 kg)   Body mass index is 22.24 kg/m. Physical Exam Vitals and nursing note reviewed.  Constitutional:      Appearance: Normal appearance.  HENT:     Head: Normocephalic and atraumatic.     Mouth/Throat:     Mouth: Mucous membranes are moist.  Eyes:     Extraocular Movements: Extraocular movements intact.     Conjunctiva/sclera:  Conjunctivae normal.     Pupils: Pupils are equal, round, and reactive to light.  Cardiovascular:     Rate and Rhythm: Normal rate and regular rhythm.     Heart sounds: No murmur heard. Pulmonary:     Effort: Pulmonary effort is normal.     Breath sounds: No rales.  Abdominal:     General: Bowel sounds are normal.     Palpations: Abdomen is soft.     Tenderness: There is no abdominal tenderness.  Musculoskeletal:     Cervical back: Normal range of motion and neck supple.     Right lower leg: No edema.     Left lower leg: No edema.  Skin:    General: Skin is warm and dry.  Neurological:     General: No focal deficit present.     Mental Status: She is alert. Mental status is at baseline.     Gait: Gait abnormal.     Comments: Oriented to person  Psychiatric:     Comments: Mild anxious facial looks during my examination.     Labs reviewed: Recent Labs    05/26/21 0000 06/08/21 0000 06/25/21 0000  NA 136* 135* 136*  K 4.3 4.4 4.2  CL 102 104 103  CO2 21 23* 27*  BUN 18 21 22*  CREATININE 0.7 0.7 0.7  CALCIUM 9.6 9.5 9.3   Recent Labs    04/09/21 0000 06/08/21 0000 06/25/21 0000  AST 30 29 38*  ALT 18 32 51*  ALKPHOS 62 156* 135*  ALBUMIN 3.5 3.9 3.5   Recent Labs    03/19/21 1221 04/09/21 0000 06/08/21 0000  WBC 6.9 5.6 7.1  NEUTROABS 5,099.00 3,730.00 5,254.00  HGB 13.0 11.7* 12.9  HCT 38 35* 39  PLT 251 237 306   Lab Results  Component Value Date   TSH 2.703 06/03/2020   Lab Results  Component Value Date   HGBA1C 5.4 03/14/2018   Lab Results  Component Value Date   CHOL 178 01/07/2021   HDL 65 01/07/2021   LDLCALC 88 01/07/2021   TRIG 145 01/07/2021   CHOLHDL 2.6 03/14/2018    Significant Diagnostic Results in last 30 days:  No results found.  Assessment/Plan  Essential hypertension Runs low, off meds.   Hyperlipidemia takes Atorvastatin 10mg  qd. LDL 88 01/07/21  Osteoporosis without current pathological fracture takes Ca, Vit D,  has been Reclast before.   Chronic constipation takes Colace bid, MiraLax qod.   Hyponatremia Na 136 06/25/21  Benign paroxysmal positional vertigo  takes prn Meclizine  Generalized osteoarthritis of multiple sites s/p right knee contusion, ribs fxs left 6th, 7th.   Dysphagia  risk for aspiration.   Frequent falls  frailty, gait abnormality, poor safety awareness are contributory.   Vascular dementia [facility])  The patient resides in SNF  [facility] for safety, care assistance, on Memantine 10mg  bid for memory.  PAF (paroxysmal atrial fibrillation) (HCC)  heart rate is in control, off Diltiazem due to low Bp/falling, ASA, Hgb 12.9 06/08/21  Anxiety due to dementia [facility]) takes Sertraline,  Seroquel    Family/ staff Communication: plan of care reviewed with the patient and charge nurse.   Labs/tests ordered: none  Time spend 35 minutes.

## 2021-09-02 NOTE — Assessment & Plan Note (Signed)
frailty, gait abnormality, poor safety awareness are contributory.

## 2021-09-02 NOTE — Assessment & Plan Note (Signed)
s/p right knee contusion, ribs fxs left 6th, 7th.

## 2021-09-03 ENCOUNTER — Encounter: Payer: Self-pay | Admitting: Nurse Practitioner

## 2021-09-06 ENCOUNTER — Non-Acute Institutional Stay (SKILLED_NURSING_FACILITY): Payer: Medicare PPO | Admitting: Nurse Practitioner

## 2021-09-06 ENCOUNTER — Encounter: Payer: Self-pay | Admitting: Nurse Practitioner

## 2021-09-06 DIAGNOSIS — M810 Age-related osteoporosis without current pathological fracture: Secondary | ICD-10-CM

## 2021-09-06 DIAGNOSIS — M159 Polyosteoarthritis, unspecified: Secondary | ICD-10-CM

## 2021-09-06 DIAGNOSIS — I10 Essential (primary) hypertension: Secondary | ICD-10-CM

## 2021-09-06 DIAGNOSIS — H8110 Benign paroxysmal vertigo, unspecified ear: Secondary | ICD-10-CM | POA: Diagnosis not present

## 2021-09-06 DIAGNOSIS — K5909 Other constipation: Secondary | ICD-10-CM | POA: Diagnosis not present

## 2021-09-06 DIAGNOSIS — I480 Paroxysmal atrial fibrillation: Secondary | ICD-10-CM

## 2021-09-06 DIAGNOSIS — F01C4 Vascular dementia, severe, with anxiety: Secondary | ICD-10-CM

## 2021-09-06 DIAGNOSIS — E782 Mixed hyperlipidemia: Secondary | ICD-10-CM | POA: Diagnosis not present

## 2021-09-06 DIAGNOSIS — E871 Hypo-osmolality and hyponatremia: Secondary | ICD-10-CM

## 2021-09-06 DIAGNOSIS — F0394 Unspecified dementia, unspecified severity, with anxiety: Secondary | ICD-10-CM | POA: Diagnosis not present

## 2021-09-06 DIAGNOSIS — R1310 Dysphagia, unspecified: Secondary | ICD-10-CM

## 2021-09-06 NOTE — Assessment & Plan Note (Signed)
takes prn Meclizine °

## 2021-09-06 NOTE — Assessment & Plan Note (Signed)
s/p right knee contusion, ribs fxs left 6th, 7th.  

## 2021-09-06 NOTE — Assessment & Plan Note (Signed)
heart rate is in control, off Diltiazem due to low Bp/falling, ASA, Hgb 12.9 06/08/21 °

## 2021-09-06 NOTE — Assessment & Plan Note (Signed)
Na 136 06/25/21

## 2021-09-06 NOTE — Assessment & Plan Note (Signed)
Will add Senokot S II qhs, continue MiraLax, dc Colace, apply 2.5 % Hydrocortisone cream bid to anal area x 7 days. Observe.

## 2021-09-06 NOTE — Progress Notes (Signed)
Location:   [facility] Room Number: 25 Place of Service:  SNF (31) Provider: Arna Snipe Lalo Tromp NP  Adenike Shidler X, NP  Patient Care Team: Hamzeh Tall X, NP as PCP - General (Internal Medicine) Nahser, Deloris Ping, MD as PCP - Cardiology (Cardiology) Venita Lick, MD as Consulting Physician (Orthopedic Surgery) Marykay Lex, MD as Consulting Physician (Cardiology) Jay Haskew X, NP as Nurse Practitioner (Internal Medicine)  Extended Emergency Contact Information Primary Emergency Contact: [name],Madell Address: [address]           [place], [place] [address] [place] Home Phone: [phone] Mobile Phone: [phone] Relation: Niece Secondary Emergency Contact: [name],[name]  [place] Mobile Phone: [phone] Relation: Significant other  Code Status: DNR Goals of care: Advanced Directive information Advanced Directives 07/16/2021  Does Patient Have a Medical Advance Directive? Yes  Type of Estate agent of [place];Living will;Out of facility DNR (pink MOST or yellow form)  Does patient want to make changes to medical advance directive? No - Patient declined  Copy of Healthcare Power of Attorney in Chart? Yes - validated most recent copy scanned in chart (See row information)  Would patient like information on creating a medical advance directive? -  Pre-existing out of facility DNR order (yellow form or pink MOST form) Yellow form placed in chart (order not valid for inpatient use);Pink MOST form placed in chart (order not valid for inpatient use)     Chief Complaint  Patient presents with   Acute Visit    Constipation, rectal bleed.     HPI:  Pt is a 87 y.o. female seen today for an acute visit for reported rectum bleed after a very very hard large stool, currently taking Colace bid, MiraLax daily.    Frequent falls, last fall 07/05/21. Fall 06/16/21, resulted in ight knee contusion, s/p Ortho, refused splint.               COVID infection, s/p Paxilovid             Anxiety, takes Sertraline,  Seroquel              AFib, heart rate is in control, off Diltiazem due to low Bp/falling, ASA, Hgb 12.9 06/08/21             The patient resides in [facility] for safety, care assistance, on Memantine 10mg  bid for memory.             HTN, blood pressure is controlled, off Diltiazem, takes  ASA 81mg  qd.             Hyperlipidemia, takes Atorvastatin 10mg  qd. LDL 88 01/07/21             OP, takes Ca, Vit D, has been Reclast before.              Constipation, takes MiraLax.              Hyponatremia, Na 136 06/25/21             BPPV, takes prn Meclizine             OA, s/p right knee contusion, ribs fxs left 6th, 7th.              Dysphagia, risk for aspiration.              Frequent falls, frailty, gait abnormality, poor safety awareness are contributory.  Past Medical History:  Diagnosis Date  Arteriosclerotic cardiovascular disease    Bee sting reaction 08/23/2016   Tongue swelling and difficulty swallowing /notes 08/23/2016   Constipation    Degenerative joint disease of right hip 10/15/14   Fracture of multiple pubic rami (HCC) 10/15/14   Right inferior and superior pubic rami   HOH (hard of hearing)    Wears bilateral hearing aids   Hyperlipidemia    Hypertension    Macular degeneration    Memory deficit    Osteoporosis    Pain in right foot    Palpitations    Paroxysmal atrial fibrillation (HCC)    Thyroid nodule    Status post surgery   Ulcer of hard palate    Vertigo    Past Surgical History:  Procedure Laterality Date   ABDOMINAL HYSTERECTOMY  1968   APPENDECTOMY  1940's   COLONOSCOPY     FOOT SURGERY Left    HARDWARE REMOVAL Right 02/16/2016   Procedure: HARDWARE REMOVAL RT HIP;  Surgeon: Sheral Apley, MD;  Location: [facility] OR;  Service: Orthopedics;  Laterality: Right;   HIP ARTHROPLASTY Right 02/16/2016   Procedure: ARTHROPLASTY BIPOLAR HIP (HEMIARTHROPLASTY);  Surgeon: Sheral Apley, MD;   Location: [facility] OR;  Service: Orthopedics;  Laterality: Right;   INTRAMEDULLARY (IM) NAIL INTERTROCHANTERIC Right 09/11/2015   Procedure: INTRAMEDULLARY (IM) NAIL INTERTROCHANTRIC;  Surgeon: Sheral Apley, MD;  Location: [facility] OR;  Service: Orthopedics;  Laterality: Right;   KNEE SURGERY      No Known Allergies  Allergies as of 09/06/2021   No Known Allergies      Medication List        Accurate as of September 06, 2021 11:59 PM. If you have any questions, ask your nurse or doctor.          aspirin 81 MG chewable tablet Chew 81 mg by mouth daily.   atorvastatin 10 MG tablet Commonly known as: LIPITOR Take 10 mg by mouth daily.   Biotin 10 MG Tabs Take by mouth daily.   calcium carbonate 1500 (600 Ca) MG Tabs tablet Commonly known as: OSCAL Take 600 mg of elemental calcium by mouth 2 (two) times daily with a meal.   docusate sodium 100 MG capsule Commonly known as: COLACE Take 100 mg by mouth 2 (two) times daily.   EPINEPHrine 0.3 mg/0.3 mL Soaj injection Commonly known as: EPI-PEN Inject 0.3 mLs (0.3 mg total) into the muscle once as needed (anaphylaxis/allergic reaction).   lactose free nutrition Liqd Take 237 mLs by mouth. Once a day at breakfast   melatonin 3 MG Tabs tablet Take 3 mg by mouth at bedtime as needed (sleep).   memantine 10 MG tablet Commonly known as: NAMENDA Take 10 mg by mouth 2 (two) times daily.   polyethylene glycol 17 g packet Commonly known as: MIRALAX / GLYCOLAX Take 17 g by mouth every other day.   QUEtiapine 25 MG tablet Commonly known as: SEROQUEL Take 25 mg by mouth at bedtime.   sertraline 50 MG tablet Commonly known as: ZOLOFT Take 50 mg by mouth daily.   Therems-M Tabs Take 1 tablet by mouth daily.   PreserVision AREDS Tabs Take 2 tablets by mouth daily.   Vitamin D3 50 MCG (2000 UT) Tabs Take 2,000 Units by mouth daily.        Review of Systems  Constitutional:  Negative for fatigue, fever and unexpected  weight change.  HENT:  Positive for hearing loss. Negative for congestion, rhinorrhea and voice change.  Hears better left ear  Respiratory:  Negative for cough.   Cardiovascular:  Negative for leg swelling.  Gastrointestinal:  Positive for anal bleeding and constipation. Negative for abdominal pain, nausea and vomiting.       Rectal bleed after passing a very large hard stool  Genitourinary:  Negative for dysuria and urgency.  Musculoskeletal:  Positive for arthralgias and gait problem. Negative for joint swelling.       Right knee, left shoulder  Skin:  Negative for color change.  Neurological:  Negative for speech difficulty, weakness and headaches.       Memory lapses. Chronic on and off dizziness  Psychiatric/Behavioral:  Positive for confusion. Negative for behavioral problems and sleep disturbance. The patient is nervous/anxious.        Mood is stabilizing.    Immunization History  Administered Date(s) Administered   Influenza Whole 08/25/2018   Influenza, High Dose Seasonal PF 08/24/2019   Influenza-Unspecified 09/07/2016, 09/12/2017, 09/02/2020   Moderna Sars-Covid-2 Vaccination 11/23/2019, 12/21/2019, 09/29/2020, 04/20/2021   PPD Test 02/18/2016   Pneumococcal Conjugate-13 09/18/2017   Pneumococcal-Unspecified 10/22/2018   Tdap 10/07/2012   Pertinent  Health Maintenance Due  Topic Date Due   INFLUENZA VACCINE  06/21/2021   DEXA SCAN  Completed   Fall Risk  08/17/2018 08/03/2017 10/24/2014  Falls in the past year? No No Yes  Number falls in past yr: - - 1  Injury with Fall? - - Yes  Risk Factor Category  - - High Fall Risk  Risk for fall due to : - - History of fall(s)   Functional Status Survey:    Vitals:   09/06/21 1547  BP: 136/77  Pulse: 73  Resp: 20  Temp: (!) 97.3 F (36.3 C)  SpO2: 98%  Weight: 117 lb 11.2 oz (53.4 kg)   Body mass index is 22.24 kg/m. Physical Exam Vitals and nursing note reviewed.  Constitutional:      Appearance: Normal  appearance.  HENT:     Head: Normocephalic and atraumatic.     Mouth/Throat:     Mouth: Mucous membranes are moist.  Eyes:     Extraocular Movements: Extraocular movements intact.     Conjunctiva/sclera: Conjunctivae normal.     Pupils: Pupils are equal, round, and reactive to light.  Cardiovascular:     Rate and Rhythm: Normal rate and regular rhythm.     Heart sounds: No murmur heard. Pulmonary:     Effort: Pulmonary effort is normal.     Breath sounds: No rales.  Abdominal:     General: Bowel sounds are normal.     Palpations: Abdomen is soft.     Tenderness: There is no abdominal tenderness. There is no right CVA tenderness, left CVA tenderness, guarding or rebound.  Genitourinary:    Rectum: Normal.  Musculoskeletal:     Cervical back: Normal range of motion and neck supple.     Right lower leg: No edema.     Left lower leg: No edema.  Skin:    General: Skin is warm and dry.  Neurological:     General: No focal deficit present.     Mental Status: She is alert. Mental status is at baseline.     Gait: Gait abnormal.     Comments: Oriented to person  Psychiatric:     Comments: Mild anxious facial looks during my examination.     Labs reviewed: Recent Labs    05/26/21 0000 06/08/21 0000 06/25/21 0000  NA 136* 135* 136*  K 4.3 4.4 4.2  CL 102 104 103  CO2 21 23* 27*  BUN 18 21 22*  CREATININE 0.7 0.7 0.7  CALCIUM 9.6 9.5 9.3   Recent Labs    04/09/21 0000 06/08/21 0000 06/25/21 0000  AST 30 29 38*  ALT 18 32 51*  ALKPHOS 62 156* 135*  ALBUMIN 3.5 3.9 3.5   Recent Labs    03/19/21 1221 04/09/21 0000 06/08/21 0000  WBC 6.9 5.6 7.1  NEUTROABS 5,099.00 3,730.00 5,254.00  HGB 13.0 11.7* 12.9  HCT 38 35* 39  PLT 251 237 306   Lab Results  Component Value Date   TSH 2.703 06/03/2020   Lab Results  Component Value Date   HGBA1C 5.4 03/14/2018   Lab Results  Component Value Date   CHOL 178 01/07/2021   HDL 65 01/07/2021   LDLCALC 88 01/07/2021    TRIG 145 01/07/2021   CHOLHDL 2.6 03/14/2018    Significant Diagnostic Results in last 30 days:  No results found.  Assessment/Plan: Chronic constipation Will add Senokot S II qhs, continue MiraLax, dc Colace, apply 2.5 % Hydrocortisone cream bid to anal area x 7 days. Observe.   Anxiety due to dementia [facility]) takes Sertraline,  Seroquel   PAF (paroxysmal atrial fibrillation) (HCC) heart rate is in control, off Diltiazem due to low Bp/falling, ASA, Hgb 12.9 06/08/21  Vascular dementia [facility]) The patient resides in SNF  [facility] for safety, care assistance, on Memantine 10mg  bid for memory.  Essential hypertension  blood pressure is controlled, off Diltiazem, takes  ASA 81mg  qd.  Hyperlipidemia  takes Atorvastatin 10mg  qd. LDL 88 01/07/21  Osteoporosis without current pathological fracture takes Ca, Vit D, has been Reclast before.   Hyponatremia Na 136 06/25/21  Benign paroxysmal positional vertigo  takes prn Meclizine  Generalized osteoarthritis of multiple sites s/p right knee contusion, ribs fxs left 6th, 7th.   Dysphagia risk for aspiration.     Family/ staff Communication: plan of care reviewed with the patient and charge nurse.   Labs/tests ordered:  none  Time spend 35 minutes.

## 2021-09-06 NOTE — Assessment & Plan Note (Signed)
blood pressure is controlled, off Diltiazem, takes  ASA 81mg  qd.

## 2021-09-06 NOTE — Assessment & Plan Note (Signed)
takes Sertraline,  Seroquel  

## 2021-09-06 NOTE — Assessment & Plan Note (Signed)
The patient resides in [facility] for safety, care assistance, on Memantine 10mg bid for memory.   

## 2021-09-06 NOTE — Assessment & Plan Note (Signed)
takes Ca, Vit D, has been Reclast before.  °

## 2021-09-06 NOTE — Assessment & Plan Note (Signed)
takes Atorvastatin 10mg qd. LDL 88 01/07/21 °

## 2021-09-06 NOTE — Assessment & Plan Note (Signed)
risk for aspiration.  °

## 2021-09-07 ENCOUNTER — Encounter: Payer: Self-pay | Admitting: Nurse Practitioner

## 2021-09-28 ENCOUNTER — Non-Acute Institutional Stay (SKILLED_NURSING_FACILITY): Payer: Medicare PPO | Admitting: Internal Medicine

## 2021-09-28 ENCOUNTER — Encounter: Payer: Self-pay | Admitting: Internal Medicine

## 2021-09-28 DIAGNOSIS — F339 Major depressive disorder, recurrent, unspecified: Secondary | ICD-10-CM

## 2021-09-28 DIAGNOSIS — G301 Alzheimer's disease with late onset: Secondary | ICD-10-CM | POA: Diagnosis not present

## 2021-09-28 DIAGNOSIS — M810 Age-related osteoporosis without current pathological fracture: Secondary | ICD-10-CM | POA: Diagnosis not present

## 2021-09-28 DIAGNOSIS — F02C18 Dementia in other diseases classified elsewhere, severe, with other behavioral disturbance: Secondary | ICD-10-CM

## 2021-09-28 DIAGNOSIS — R296 Repeated falls: Secondary | ICD-10-CM

## 2021-09-28 DIAGNOSIS — E782 Mixed hyperlipidemia: Secondary | ICD-10-CM | POA: Diagnosis not present

## 2021-09-28 DIAGNOSIS — I498 Other specified cardiac arrhythmias: Secondary | ICD-10-CM

## 2021-09-28 NOTE — Progress Notes (Signed)
Location:   [facility] Room Number: 25 Place of Service:  SNF (31) Provider:  Einar Crow MD  Mast, Man X, NP  Patient Care Team: Mast, Man X, NP as PCP - General (Internal Medicine) Nahser, Deloris Ping, MD as PCP - Cardiology (Cardiology) Venita Lick, MD as Consulting Physician (Orthopedic Surgery) Marykay Lex, MD as Consulting Physician (Cardiology) Mast, Man X, NP as Nurse Practitioner (Internal Medicine)  Extended Emergency Contact Information Primary Emergency Contact: [name],Sedra Address: [address]           [place], [place] [address] [place] Home Phone: [phone] Mobile Phone: [phone] Relation: Niece Secondary Emergency Contact: [name],[name]  [place] Mobile Phone: [phone] Relation: Significant other  Code Status:  DNR Managed Care Goals of care: Advanced Directive information Advanced Directives 09/28/2021  Does Patient Have a Medical Advance Directive? Yes  Type of Estate agent of [place];Living will;Out of facility DNR (pink MOST or yellow form)  Does patient want to make changes to medical advance directive? No - Patient declined  Copy of Healthcare Power of Attorney in Chart? Yes - validated most recent copy scanned in chart (See row information)  Would patient like information on creating a medical advance directive? -  Pre-existing out of facility DNR order (yellow form or pink MOST form) Yellow form placed in chart (order not valid for inpatient use);Pink MOST form placed in chart (order not valid for inpatient use)     Chief Complaint  Patient presents with   Medical Management of Chronic Issues   Quality Metric Gaps    Shingrix, PCV    HPI:  Pt is a 87 y.o. female seen today for medical management of chronic diseases.    She has h/o Hypertension, Hyperlipidemia, Cognitive Impairment Most likely Alzheimer dementia with Recent Behavior sissues BPPV, Vit D  def, Hyponatremia,  Macular degeneration Accelerated Junctional Rhythm on Cardizem  Was taken off Cardizem due to Low BP and Falls Also on Seroquel for behavior issues Otherwise doing well. Walks with her walker Weight is mostly stable No New issues Past Medical History:  Diagnosis Date   Arteriosclerotic cardiovascular disease    Bee sting reaction 08/23/2016   Tongue swelling and difficulty swallowing /notes 08/23/2016   Constipation    Degenerative joint disease of right hip 10/15/14   Fracture of multiple pubic rami (HCC) 10/15/14   Right inferior and superior pubic rami   HOH (hard of hearing)    Wears bilateral hearing aids   Hyperlipidemia    Hypertension    Macular degeneration    Memory deficit    Osteoporosis    Pain in right foot    Palpitations    Paroxysmal atrial fibrillation (HCC)    Thyroid nodule    Status post surgery   Ulcer of hard palate    Vertigo    Past Surgical History:  Procedure Laterality Date   ABDOMINAL HYSTERECTOMY  1968   APPENDECTOMY  1940's   COLONOSCOPY     FOOT SURGERY Left    HARDWARE REMOVAL Right 02/16/2016   Procedure: HARDWARE REMOVAL RT HIP;  Surgeon: Sheral Apley, MD;  Location: [facility] OR;  Service: Orthopedics;  Laterality: Right;   HIP ARTHROPLASTY Right 02/16/2016   Procedure: ARTHROPLASTY BIPOLAR HIP (HEMIARTHROPLASTY);  Surgeon: Sheral Apley, MD;  Location: [facility] OR;  Service: Orthopedics;  Laterality: Right;   INTRAMEDULLARY (IM) NAIL INTERTROCHANTERIC Right 09/11/2015   Procedure: INTRAMEDULLARY (IM) NAIL INTERTROCHANTRIC;  Surgeon:  Sheral Apley, MD;  Location: [facility] OR;  Service: Orthopedics;  Laterality: Right;   KNEE SURGERY      No Known Allergies  Allergies as of 09/28/2021   No Known Allergies      Medication List        Accurate as of September 28, 2021 10:47 AM. If you have any questions, ask your nurse or doctor.          STOP taking these medications    docusate sodium 100 MG capsule Commonly  known as: COLACE Stopped by: Mahlon Gammon, MD       TAKE these medications    aspirin 81 MG chewable tablet Chew 81 mg by mouth daily.   atorvastatin 10 MG tablet Commonly known as: LIPITOR Take 10 mg by mouth daily.   Biofreeze 4 % Gel Generic drug: Menthol (Topical Analgesic) Apply topically 4 (four) times daily as needed.   Biotin 10 MG Tabs Take by mouth daily.   calcium carbonate 1500 (600 Ca) MG Tabs tablet Commonly known as: OSCAL Take 600 mg of elemental calcium by mouth 2 (two) times daily with a meal.   EPINEPHrine 0.3 mg/0.3 mL Soaj injection Commonly known as: EPI-PEN Inject 0.3 mLs (0.3 mg total) into the muscle once as needed (anaphylaxis/allergic reaction).   lactose free nutrition Liqd Take 237 mLs by mouth. Once a day at breakfast   melatonin 3 MG Tabs tablet Take 3 mg by mouth at bedtime as needed (sleep).   memantine 10 MG tablet Commonly known as: NAMENDA Take 10 mg by mouth 2 (two) times daily.   polyethylene glycol 17 g packet Commonly known as: MIRALAX / GLYCOLAX Take 17 g by mouth every other day.   QUEtiapine 25 MG tablet Commonly known as: SEROQUEL Take 25 mg by mouth at bedtime.   sennosides-docusate sodium 8.6-50 MG tablet Commonly known as: SENOKOT-S Take 1 tablet by mouth at bedtime.   sertraline 50 MG tablet Commonly known as: ZOLOFT Take 50 mg by mouth daily.   Therems-M Tabs Take 1 tablet by mouth daily.   PreserVision AREDS Tabs Take 2 tablets by mouth daily.   Vitamin D3 50 MCG (2000 UT) Tabs Take 2,000 Units by mouth daily.        Review of Systems  Unable to perform ROS: Dementia   Immunization History  Administered Date(s) Administered   Influenza Whole 08/25/2018   Influenza, High Dose Seasonal PF 08/24/2019   Influenza-Unspecified 09/07/2016, 09/12/2017, 09/02/2020, 09/08/2021   Moderna Sars-Covid-2 Vaccination 11/23/2019, 12/21/2019, 09/29/2020, 04/20/2021, 08/10/2021   PPD Test 02/18/2016    Pneumococcal Conjugate-13 09/18/2017   Pneumococcal-Unspecified 10/22/2018   Tdap 10/07/2012   Pertinent  Health Maintenance Due  Topic Date Due   INFLUENZA VACCINE  Completed   DEXA SCAN  Completed   Fall Risk 08/03/2017 08/17/2018 01/21/2019 01/22/2019 01/22/2019  Falls in the past year? No No - - -  Was there an injury with Fall? - - - - -  Patient Fall Risk Level - - Moderate fall risk Moderate fall risk Moderate fall risk  Patient at Risk for Falls Due to - - - - -   Functional Status Survey:    Vitals:   09/28/21 1011  BP: 118/68  Pulse: 74  Resp: 18  Temp: (!) 97.5 F (36.4 C)  SpO2: 94%  Weight: 115 lb 9.6 oz (52.4 kg)  Height: 5\' 1"  (1.549 m)   Body mass index is 21.84 kg/m. Physical Exam Constitutional: Well-developed and well-nourished.  HENT:  Head: Normocephalic.  Mouth/Throat: Oropharynx is clear and moist.  Eyes: Pupils are equal, round, and reactive to light.  Neck: Neck supple.  Cardiovascular: Normal rate and normal heart sounds.  No murmur heard. Pulmonary/Chest: Effort normal and breath sounds normal. No respiratory distress. No wheezes. She has no rales.  Abdominal: Soft. Bowel sounds are normal. No distension. There is no tenderness. There is no rebound.  Musculoskeletal: No edema.  Lymphadenopathy: none Neurological: Alert With No Focal deficits Walks with her walker Skin: Skin is warm and dry.  Psychiatric: Normal mood and affect. Behavior is normal. Thought content normal.   Labs reviewed: Recent Labs    06/25/21 0000 07/06/21 0000 07/20/21 0000  NA 136* 137 137  K 4.2 4.2 4.1  CL 103 104 104  CO2 27* 26* 26*  BUN 22* 26* 23*  CREATININE 0.7 0.8 0.7  CALCIUM 9.3 9.5 9.6   Recent Labs    06/25/21 0000 07/06/21 0000 07/20/21 0000  AST 38* 29 24  ALT 51* 34 21  ALKPHOS 135* 98 81  ALBUMIN 3.5 3.5 3.6   Recent Labs    03/19/21 1221 04/09/21 0000 06/08/21 0000  WBC 6.9 5.6 7.1  NEUTROABS 5,099.00 3,730.00 5,254.00  HGB 13.0  11.7* 12.9  HCT 38 35* 39  PLT 251 237 306   Lab Results  Component Value Date   TSH 2.703 06/03/2020   Lab Results  Component Value Date   HGBA1C 5.4 03/14/2018   Lab Results  Component Value Date   CHOL 178 01/07/2021   HDL 65 01/07/2021   LDLCALC 88 01/07/2021   TRIG 145 01/07/2021   CHOLHDL 2.6 03/14/2018    Significant Diagnostic Results in last 30 days:  No results found.  Assessment/Plan Severe late onset Alzheimer's dementia with other behavioral disturbance (HCC) Walks with her walker On Namenda Also on Seroquel low dose for her behaviors Depression, recurrent (HCC) Continue Zoloft Mixed hyperlipidemia On statin LDL 88 in 2/22 Accelerated junctional rhythm Off Cardizem due to Low BP and Falls Age-related osteoporosis without current pathological fracture Calcium and Vit D Frequent falls Supportive care   Family/ staff Communication:   Labs/tests ordered:

## 2021-09-30 DIAGNOSIS — F320 Major depressive disorder, single episode, mild: Secondary | ICD-10-CM | POA: Diagnosis not present

## 2021-09-30 DIAGNOSIS — F411 Generalized anxiety disorder: Secondary | ICD-10-CM | POA: Diagnosis not present

## 2021-10-29 DIAGNOSIS — F320 Major depressive disorder, single episode, mild: Secondary | ICD-10-CM | POA: Diagnosis not present

## 2021-10-29 DIAGNOSIS — F411 Generalized anxiety disorder: Secondary | ICD-10-CM | POA: Diagnosis not present

## 2021-11-05 ENCOUNTER — Encounter: Payer: Self-pay | Admitting: Nurse Practitioner

## 2021-11-05 ENCOUNTER — Non-Acute Institutional Stay (SKILLED_NURSING_FACILITY): Payer: Medicare PPO | Admitting: Nurse Practitioner

## 2021-11-05 DIAGNOSIS — I480 Paroxysmal atrial fibrillation: Secondary | ICD-10-CM | POA: Diagnosis not present

## 2021-11-05 DIAGNOSIS — E782 Mixed hyperlipidemia: Secondary | ICD-10-CM

## 2021-11-05 DIAGNOSIS — F01C4 Vascular dementia, severe, with anxiety: Secondary | ICD-10-CM | POA: Diagnosis not present

## 2021-11-05 DIAGNOSIS — I10 Essential (primary) hypertension: Secondary | ICD-10-CM

## 2021-11-05 DIAGNOSIS — H8110 Benign paroxysmal vertigo, unspecified ear: Secondary | ICD-10-CM | POA: Diagnosis not present

## 2021-11-05 DIAGNOSIS — K5909 Other constipation: Secondary | ICD-10-CM | POA: Diagnosis not present

## 2021-11-05 DIAGNOSIS — E871 Hypo-osmolality and hyponatremia: Secondary | ICD-10-CM | POA: Diagnosis not present

## 2021-11-05 DIAGNOSIS — F0394 Unspecified dementia, unspecified severity, with anxiety: Secondary | ICD-10-CM

## 2021-11-05 DIAGNOSIS — M159 Polyosteoarthritis, unspecified: Secondary | ICD-10-CM

## 2021-11-05 DIAGNOSIS — M810 Age-related osteoporosis without current pathological fracture: Secondary | ICD-10-CM | POA: Diagnosis not present

## 2021-11-05 DIAGNOSIS — R1310 Dysphagia, unspecified: Secondary | ICD-10-CM

## 2021-11-05 NOTE — Assessment & Plan Note (Signed)
takes MiraLax  

## 2021-11-05 NOTE — Assessment & Plan Note (Signed)
takes Sertraline,  Seroquel, escalated 2/2 moving into Memory Care Unit. Will have prn Lorazepam available to her x 72 hours.

## 2021-11-05 NOTE — Assessment & Plan Note (Signed)
takes Ca, Vit D, has been Reclast before.

## 2021-11-05 NOTE — Assessment & Plan Note (Signed)
healed right knee contusion, ribs fxs left 6th, 7th.

## 2021-11-05 NOTE — Progress Notes (Signed)
Location:   [facility] Room Number: 101 A Place of Service:  SNF (31) Provider: Arna Snipe Tanis Hensarling NP  Charm Stenner X, NP  Patient Care Team: Errin Chewning X, NP as PCP - General (Internal Medicine) Nahser, Deloris Ping, MD as PCP - Cardiology (Cardiology) Venita Lick, MD as Consulting Physician (Orthopedic Surgery) Marykay Lex, MD as Consulting Physician (Cardiology) Meshelle Holness X, NP as Nurse Practitioner (Internal Medicine)  Extended Emergency Contact Information Primary Emergency Contact: [name],Elishia Address: [address]           [place], [place] [address] [place] of [place] Home Phone: [phone] Mobile Phone: [phone] Relation: Niece Secondary Emergency Contact: [name],[name]  [place] Mobile Phone: [phone] Relation: Significant other  Code Status:  DNR Goals of care: Advanced Directive information Advanced Directives 11/05/2021  Does Patient Have a Medical Advance Directive? Yes  Type of Estate agent of [place];Living will;Out of facility DNR (pink MOST or yellow form)  Does patient want to make changes to medical advance directive? No - Patient declined  Copy of Healthcare Power of Attorney in Chart? Yes - validated most recent copy scanned in chart (See row information)  Would patient like information on creating a medical advance directive? -  Pre-existing out of facility DNR order (yellow form or pink MOST form) Yellow form placed in chart (order not valid for inpatient use);Pink MOST form placed in chart (order not valid for inpatient use)     Chief Complaint  Patient presents with   Medical Management of Chronic Issues    Routine Visit   Quality Metric Gaps    Shingrix, Pneumonia Vaccine    HPI:  Pt is a 87 y.o. female seen today for medical management of chronic diseases.     Frequent falls, last fall 07/05/21. Fall 06/16/21, resulted in ight knee contusion, s/p Ortho, refused splint.              COVID  infection, s/p Paxilovid             Anxiety, takes Sertraline,  Seroquel, escalated 2/2 moving into Memory Care Unit.              AFib, heart rate is in control, off Diltiazem due to low Bp/falling, ASA, Hgb 12.9 06/08/21             The patient resides in [facility] for safety, care assistance, ambulates with walker, on Memantine for memory.             HTN, blood pressure is controlled, off Diltiazem, takes  ASA  qd. Bun/creat 23/0.7 07/20/21             Hyperlipidemia, takes Atorvastatin  qd. LDL 88 01/07/21             OP, takes Ca, Vit D, has been Reclast before.              Constipation, takes MiraLax.              Hyponatremia, Na 137 07/20/21             BPPV, takes prn Meclizine             OA, healed right knee contusion, ribs fxs left 6th, 7th.              Dysphagia, risk for aspiration.              Past Medical History:  Diagnosis Date  Arteriosclerotic cardiovascular disease    Bee sting reaction 08/23/2016   Tongue swelling and difficulty swallowing /notes 08/23/2016   Constipation    Degenerative joint disease of right hip 10/15/14   Fracture of multiple pubic rami (HCC) 10/15/14   Right inferior and superior pubic rami   HOH (hard of hearing)    Wears bilateral hearing aids   Hyperlipidemia    Hypertension    Macular degeneration    Memory deficit    Osteoporosis    Pain in right foot    Palpitations    Paroxysmal atrial fibrillation (HCC)    Thyroid nodule    Status post surgery   Ulcer of hard palate    Vertigo    Past Surgical History:  Procedure Laterality Date   ABDOMINAL HYSTERECTOMY  1968   APPENDECTOMY  1940's   COLONOSCOPY     FOOT SURGERY Left    HARDWARE REMOVAL Right 02/16/2016   Procedure: HARDWARE REMOVAL RT HIP;  Surgeon: Sheral Apley, MD;  [facility]: [facility] OR;  Service: Orthopedics;  Laterality: Right;   HIP ARTHROPLASTY Right 02/16/2016   Procedure: ARTHROPLASTY BIPOLAR HIP (HEMIARTHROPLASTY);  Surgeon: Sheral Apley, MD;   [facility] OR;  Service: Orthopedics;  Laterality: Right;   INTRAMEDULLARY (IM) NAIL INTERTROCHANTERIC Right 09/11/2015   Procedure: INTRAMEDULLARY (IM) NAIL INTERTROCHANTRIC;  Surgeon: Sheral Apley, MD;  Location: [facility] OR;  Service: Orthopedics;  Laterality: Right;   KNEE SURGERY      No Known Allergies  Allergies as of 11/05/2021   No Known Allergies      Medication List        Accurate as of November 05, 2021 11:59 PM. If you have any questions, ask your nurse or doctor.          aspirin 81 MG chewable tablet Chew 81 mg by mouth daily.   atorvastatin 10 MG tablet Commonly known as: LIPITOR Take 10 mg by mouth daily.   Biofreeze 4 % Gel Generic drug: Menthol (Topical Analgesic) Apply topically 4 (four) times daily as needed.   Biotin 10 MG Tabs Take by mouth daily.   calcium carbonate 1500 (600 Ca) MG Tabs tablet Commonly known as: OSCAL Take 600 mg of elemental calcium by mouth 2 (two) times daily with a meal.   EPINEPHrine 0.3 mg/0.3 mL Soaj injection Commonly known as: EPI-PEN Inject 0.3 mLs (0.3 mg total) into the muscle once as needed (anaphylaxis/allergic reaction). What changed: when to take this   lactose free nutrition Liqd Take 237 mLs by mouth. Once a day at breakfast   LORazepam 0.5 MG tablet Commonly known as: ATIVAN Take 0.5 mg by mouth every 6 (six) hours as needed for anxiety.   melatonin 3 MG Tabs tablet Take 3 mg by mouth at bedtime as needed (sleep).   memantine 10 MG tablet Commonly known as: NAMENDA Take 10 mg by mouth 2 (two) times daily.   polyethylene glycol 17 g packet Commonly known as: MIRALAX / GLYCOLAX Take 17 g by mouth every other day.   QUEtiapine 25 MG tablet Commonly known as: SEROQUEL Take 25 mg by mouth at bedtime.   sennosides-docusate sodium 8.6-50 MG tablet Commonly known as: SENOKOT-S Take 2 tablets by mouth at bedtime.   sertraline 50 MG tablet Commonly known as: ZOLOFT Take 75 mg by mouth  daily.   Therems-M Tabs Take 1 tablet by mouth daily.   PreserVision AREDS Tabs Take 2 tablets by mouth daily.   Vitamin D3 50 MCG (2000  UT) Tabs Take 2,000 Units by mouth daily.        Review of Systems  Unable to perform ROS: Dementia   Immunization History  Administered Date(s) Administered   Influenza Whole 08/25/2018   Influenza, High Dose Seasonal PF 08/24/2019   Influenza-Unspecified 09/07/2016, 09/12/2017, 09/02/2020, 09/08/2021   Moderna Sars-Covid-2 Vaccination 11/23/2019, 12/21/2019, 09/29/2020, 04/20/2021, 08/10/2021   PPD Test 02/18/2016   Pneumococcal Conjugate-13 09/18/2017   Pneumococcal-Unspecified 10/22/2018   Tdap 10/07/2012   Pertinent  Health Maintenance Due  Topic Date Due   INFLUENZA VACCINE  Completed   DEXA SCAN  Completed   Fall Risk 08/03/2017 08/17/2018 01/21/2019 01/22/2019 01/22/2019  Falls in the past year? No No - - -  Was there an injury with Fall? - - - - -  Patient Fall Risk Level - - Moderate fall risk Moderate fall risk Moderate fall risk  Patient at Risk for Falls Due to - - - - -   Functional Status Survey:    Vitals:   11/05/21 1423  BP: (!) 149/84  Pulse: 65  Resp: 17  Temp: (!) 96.9 F (36.1 C)  SpO2: 95%  Weight: 118 lb 4.8 oz (53.7 kg)  Height: 5\' 1"  (1.549 m)   Body mass index is 22.35 kg/m. Physical Exam Vitals and nursing note reviewed.  Constitutional:      Appearance: Normal appearance.  HENT:     Head: Normocephalic and atraumatic.     Mouth/Throat:     Mouth: Mucous membranes are moist.  Eyes:     Extraocular Movements: Extraocular movements intact.     Conjunctiva/sclera: Conjunctivae normal.     Pupils: Pupils are equal, round, and reactive to light.  Cardiovascular:     Rate and Rhythm: Normal rate and regular rhythm.     Heart sounds: No murmur heard. Pulmonary:     Effort: Pulmonary effort is normal.     Breath sounds: No rales.  Abdominal:     General: Bowel sounds are normal.     Palpations:  Abdomen is soft.     Tenderness: There is no abdominal tenderness.  Genitourinary:    Rectum: Normal.  Musculoskeletal:     Cervical back: Normal range of motion and neck supple.     Right lower leg: No edema.     Left lower leg: No edema.  Skin:    General: Skin is warm and dry.  Neurological:     General: No focal deficit present.     Mental Status: She is alert. Mental status is at baseline.     Gait: Gait abnormal.     Comments: Oriented to person  Psychiatric:     Comments: anxious facial looks during my examination, more confused 2/2 moved into Memory Care Unit today.     Labs reviewed: Recent Labs    06/25/21 0000 07/06/21 0000 07/20/21 0000  NA 136* 137 137  K 4.2 4.2 4.1  CL 103 104 104  CO2 27* 26* 26*  BUN 22* 26* 23*  CREATININE 0.7 0.8 0.7  CALCIUM 9.3 9.5 9.6   Recent Labs    06/25/21 0000 07/06/21 0000 07/20/21 0000  AST 38* 29 24  ALT 51* 34 21  ALKPHOS 135* 98 81  ALBUMIN 3.5 3.5 3.6   Recent Labs    03/19/21 1221 04/09/21 0000 06/08/21 0000  WBC 6.9 5.6 7.1  NEUTROABS 5,099.00 3,730.00 5,254.00  HGB 13.0 11.7* 12.9  HCT 38 35* 39  PLT 251 237 306  Lab Results  Component Value Date   TSH 2.703 06/03/2020   Lab Results  Component Value Date   HGBA1C 5.4 03/14/2018   Lab Results  Component Value Date   CHOL 178 01/07/2021   HDL 65 01/07/2021   LDLCALC 88 01/07/2021   TRIG 145 01/07/2021   CHOLHDL 2.6 03/14/2018    Significant Diagnostic Results in last 30 days:  No results found.  Assessment/Plan  Anxiety due to dementia [facility])  takes Sertraline,  Seroquel, escalated 2/2 moving into Memory Care Unit. Will have prn Lorazepam available to her x 72 hours.   PAF (paroxysmal atrial fibrillation) (HCC) heart rate is in control, off Diltiazem due to low Bp/falling, ASA, Hgb 12.9 06/08/21  Vascular dementia [facility]) The patient resides in SNF  [facility] for safety, care assistance, ambulates with walker, on Memantine for  memory.  Essential hypertension blood pressure is controlled, off Diltiazem, takes  ASA 81mg  qd. Bun/creat 23/0.7 07/20/21  Hyperlipidemia  takes Atorvastatin 10mg  qd. LDL 88 01/07/21  Osteoporosis without current pathological fracture takes Ca, Vit D, has been Reclast before.   Chronic constipation  takes MiraLax.   Hyponatremia Na 137 07/20/21  Benign paroxysmal positional vertigo  takes prn Meclizine  Generalized osteoarthritis of multiple sites healed right knee contusion, ribs fxs left 6th, 7th.   Dysphagia  risk for aspiration.    Family/ staff Communication: plan of care reviewed with the patient and charge nurse.   Labs/tests ordered:  none  Time spend 35 minutes.

## 2021-11-05 NOTE — Assessment & Plan Note (Signed)
takes Atorvastatin 10mg  qd. LDL 88 01/07/21

## 2021-11-05 NOTE — Assessment & Plan Note (Signed)
blood pressure is controlled, off Diltiazem, takes  ASA 81mg  qd. Bun/creat 23/0.7 07/20/21

## 2021-11-05 NOTE — Assessment & Plan Note (Signed)
risk for aspiration.

## 2021-11-05 NOTE — Progress Notes (Signed)
Location:   [facility] Room Number: 101 A Place of Service:  SNF (31) Provider:  Mast, Man, NP  Mast, Man X, NP  Patient Care Team: Mast, Man X, NP as PCP - General (Internal Medicine) Nahser, Deloris Ping, MD as PCP - Cardiology (Cardiology) Venita Lick, MD as Consulting Physician (Orthopedic Surgery) Marykay Lex, MD as Consulting Physician (Cardiology) Mast, Man X, NP as Nurse Practitioner (Internal Medicine)  Extended Emergency Contact Information Primary Emergency Contact: [name],Tyneisha Address: [address]           [place], [place] [address] [place] of [place] Home Phone: [phone] Mobile Phone: [phone] Relation: Niece Secondary Emergency Contact: [name],[name]  [place] Mobile Phone: [phone] Relation: Significant other  Code Status:  DNR Goals of care: Advanced Directive information Advanced Directives 11/05/2021  Does Patient Have a Medical Advance Directive? Yes  Type of Estate agent of [place];Living will;Out of facility DNR (pink MOST or yellow form)  Does patient want to make changes to medical advance directive? No - Patient declined  Copy of Healthcare Power of Attorney in Chart? Yes - validated most recent copy scanned in chart (See row information)  Would patient like information on creating a medical advance directive? -  Pre-existing out of facility DNR order (yellow form or pink MOST form) Yellow form placed in chart (order not valid for inpatient use);Pink MOST form placed in chart (order not valid for inpatient use)     Chief Complaint  Patient presents with   Medical Management of Chronic Issues    Routine Visit   Quality Metric Gaps    Shingrix, Pneumonia Vaccine    HPI:  Pt is a 87 y.o. female seen today for medical management of chronic diseases.     Past Medical History:  Diagnosis Date   Arteriosclerotic cardiovascular disease    Bee sting reaction 08/23/2016    Tongue swelling and difficulty swallowing /notes 08/23/2016   Constipation    Degenerative joint disease of right hip 10/15/14   Fracture of multiple pubic rami (HCC) 10/15/14   Right inferior and superior pubic rami   HOH (hard of hearing)    Wears bilateral hearing aids   Hyperlipidemia    Hypertension    Macular degeneration    Memory deficit    Osteoporosis    Pain in right foot    Palpitations    Paroxysmal atrial fibrillation (HCC)    Thyroid nodule    Status post surgery   Ulcer of hard palate    Vertigo    Past Surgical History:  Procedure Laterality Date   ABDOMINAL HYSTERECTOMY  1968   APPENDECTOMY  1940's   COLONOSCOPY     FOOT SURGERY Left    HARDWARE REMOVAL Right 02/16/2016   Procedure: HARDWARE REMOVAL RT HIP;  Surgeon: Sheral Apley, MD;  Location: [facility] OR;  Service: Orthopedics;  Laterality: Right;   HIP ARTHROPLASTY Right 02/16/2016   Procedure: ARTHROPLASTY BIPOLAR HIP (HEMIARTHROPLASTY);  Surgeon: Sheral Apley, MD;  Location: [facility] OR;  Service: Orthopedics;  Laterality: Right;   INTRAMEDULLARY (IM) NAIL INTERTROCHANTERIC Right 09/11/2015   Procedure: INTRAMEDULLARY (IM) NAIL INTERTROCHANTRIC;  Surgeon: Sheral Apley, MD;  Location: [facility] OR;  Service: Orthopedics;  Laterality: Right;   KNEE SURGERY      No Known Allergies  Allergies as of 11/05/2021   No Known Allergies      Medication List        Accurate as of  November 05, 2021  2:33 PM. If you have any questions, ask your nurse or doctor.          aspirin 81 MG chewable tablet Chew 81 mg by mouth daily.   atorvastatin 10 MG tablet Commonly known as: LIPITOR Take 10 mg by mouth daily.   Biofreeze 4 % Gel Generic drug: Menthol (Topical Analgesic) Apply topically 4 (four) times daily as needed.   Biotin 10 MG Tabs Take by mouth daily.   calcium carbonate 1500 (600 Ca) MG Tabs tablet Commonly known as: OSCAL Take 600 mg of elemental calcium by mouth 2 (two) times daily with a  meal.   EPINEPHrine 0.3 mg/0.3 mL Soaj injection Commonly known as: EPI-PEN Inject 0.3 mLs (0.3 mg total) into the muscle once as needed (anaphylaxis/allergic reaction). What changed: when to take this   lactose free nutrition Liqd Take 237 mLs by mouth. Once a day at breakfast   LORazepam 0.5 MG tablet Commonly known as: ATIVAN Take 0.5 mg by mouth every 6 (six) hours as needed for anxiety.   melatonin 3 MG Tabs tablet Take 3 mg by mouth at bedtime as needed (sleep).   memantine 10 MG tablet Commonly known as: NAMENDA Take 10 mg by mouth 2 (two) times daily.   polyethylene glycol 17 g packet Commonly known as: MIRALAX / GLYCOLAX Take 17 g by mouth every other day.   QUEtiapine 25 MG tablet Commonly known as: SEROQUEL Take 25 mg by mouth at bedtime.   sennosides-docusate sodium 8.6-50 MG tablet Commonly known as: SENOKOT-S Take 2 tablets by mouth at bedtime.   sertraline 50 MG tablet Commonly known as: ZOLOFT Take 75 mg by mouth daily.   Therems-M Tabs Take 1 tablet by mouth daily.   PreserVision AREDS Tabs Take 2 tablets by mouth daily.   Vitamin D3 50 MCG (2000 UT) Tabs Take 2,000 Units by mouth daily.        Review of Systems  Immunization History  Administered Date(s) Administered   Influenza Whole 08/25/2018   Influenza, High Dose Seasonal PF 08/24/2019   Influenza-Unspecified 09/07/2016, 09/12/2017, 09/02/2020, 09/08/2021   Moderna Sars-Covid-2 Vaccination 11/23/2019, 12/21/2019, 09/29/2020, 04/20/2021, 08/10/2021   PPD Test 02/18/2016   Pneumococcal Conjugate-13 09/18/2017   Pneumococcal-Unspecified 10/22/2018   Tdap 10/07/2012   Pertinent  Health Maintenance Due  Topic Date Due   INFLUENZA VACCINE  Completed   DEXA SCAN  Completed   Fall Risk 08/03/2017 08/17/2018 01/21/2019 01/22/2019 01/22/2019  Falls in the past year? No No - - -  Was there an injury with Fall? - - - - -  Patient Fall Risk Level - - Moderate fall risk Moderate fall risk  Moderate fall risk  Patient at Risk for Falls Due to - - - - -   Functional Status Survey:    Vitals:   11/05/21 1423  BP: (!) 149/84  Pulse: 65  Resp: 17  Temp: (!) 96.9 F (36.1 C)  SpO2: 95%  Weight: 118 lb 4.8 oz (53.7 kg)  Height: 5\' 1"  (1.549 m)   Body mass index is 22.35 kg/m. Physical Exam  Labs reviewed: Recent Labs    06/25/21 0000 07/06/21 0000 07/20/21 0000  NA 136* 137 137  K 4.2 4.2 4.1  CL 103 104 104  CO2 27* 26* 26*  BUN 22* 26* 23*  CREATININE 0.7 0.8 0.7  CALCIUM 9.3 9.5 9.6   Recent Labs    06/25/21 0000 07/06/21 0000 07/20/21 0000  AST 38* 29 24  ALT 51* 34 21  ALKPHOS 135* 98 81  ALBUMIN 3.5 3.5 3.6   Recent Labs    03/19/21 1221 04/09/21 0000 06/08/21 0000  WBC 6.9 5.6 7.1  NEUTROABS 5,099.00 3,730.00 5,254.00  HGB 13.0 11.7* 12.9  HCT 38 35* 39  PLT 251 237 306   Lab Results  Component Value Date   TSH 2.703 06/03/2020   Lab Results  Component Value Date   HGBA1C 5.4 03/14/2018   Lab Results  Component Value Date   CHOL 178 01/07/2021   HDL 65 01/07/2021   LDLCALC 88 01/07/2021   TRIG 145 01/07/2021   CHOLHDL 2.6 03/14/2018    Significant Diagnostic Results in last 30 days:  No results found.  Assessment/Plan There are no diagnoses linked to this encounter.   Family/ staff Communication:   Labs/tests ordered:

## 2021-11-05 NOTE — Assessment & Plan Note (Signed)
takes prn Meclizine

## 2021-11-05 NOTE — Assessment & Plan Note (Signed)
Na 137 07/20/21

## 2021-11-05 NOTE — Assessment & Plan Note (Signed)
heart rate is in control, off Diltiazem due to low Bp/falling, ASA, Hgb 12.9 06/08/21

## 2021-11-05 NOTE — Assessment & Plan Note (Signed)
The patient resides in SNF  [facility] for safety, care assistance, ambulates with walker, on Memantine for memory.

## 2021-11-06 ENCOUNTER — Encounter: Payer: Self-pay | Admitting: Nurse Practitioner

## 2021-11-08 ENCOUNTER — Encounter (HOSPITAL_COMMUNITY): Payer: Self-pay

## 2021-11-08 ENCOUNTER — Emergency Department (HOSPITAL_COMMUNITY): Payer: Medicare PPO

## 2021-11-08 ENCOUNTER — Other Ambulatory Visit: Payer: Self-pay

## 2021-11-08 ENCOUNTER — Emergency Department (HOSPITAL_COMMUNITY)
Admission: EM | Admit: 2021-11-08 | Discharge: 2021-11-09 | Disposition: A | Discharge status: Home / Self Care | Payer: Medicare PPO | Source: Home / Self Care | Attending: Emergency Medicine | Admitting: Emergency Medicine

## 2021-11-08 DIAGNOSIS — Z8616 Personal history of COVID-19: Secondary | ICD-10-CM | POA: Diagnosis not present

## 2021-11-08 DIAGNOSIS — I739 Peripheral vascular disease, unspecified: Secondary | ICD-10-CM | POA: Diagnosis not present

## 2021-11-08 DIAGNOSIS — Z66 Do not resuscitate: Secondary | ICD-10-CM | POA: Diagnosis present

## 2021-11-08 DIAGNOSIS — S52201A Unspecified fracture of shaft of right ulna, initial encounter for closed fracture: Secondary | ICD-10-CM

## 2021-11-08 DIAGNOSIS — E782 Mixed hyperlipidemia: Secondary | ICD-10-CM | POA: Diagnosis not present

## 2021-11-08 DIAGNOSIS — Z8249 Family history of ischemic heart disease and other diseases of the circulatory system: Secondary | ICD-10-CM | POA: Diagnosis not present

## 2021-11-08 DIAGNOSIS — S52691D Other fracture of lower end of right ulna, subsequent encounter for closed fracture with routine healing: Secondary | ICD-10-CM | POA: Diagnosis not present

## 2021-11-08 DIAGNOSIS — S52351A Displaced comminuted fracture of shaft of radius, right arm, initial encounter for closed fracture: Secondary | ICD-10-CM | POA: Diagnosis not present

## 2021-11-08 DIAGNOSIS — Z20822 Contact with and (suspected) exposure to covid-19: Secondary | ICD-10-CM | POA: Diagnosis present

## 2021-11-08 DIAGNOSIS — F0390 Unspecified dementia without behavioral disturbance: Secondary | ICD-10-CM | POA: Diagnosis present

## 2021-11-08 DIAGNOSIS — H3530 Unspecified macular degeneration: Secondary | ICD-10-CM | POA: Diagnosis present

## 2021-11-08 DIAGNOSIS — R402431 Glasgow coma scale score 3-8, in the field [EMT or ambulance]: Secondary | ICD-10-CM | POA: Diagnosis not present

## 2021-11-08 DIAGNOSIS — S0083XA Contusion of other part of head, initial encounter: Secondary | ICD-10-CM | POA: Diagnosis not present

## 2021-11-08 DIAGNOSIS — I480 Paroxysmal atrial fibrillation: Secondary | ICD-10-CM | POA: Diagnosis present

## 2021-11-08 DIAGNOSIS — F03911 Unspecified dementia, unspecified severity, with agitation: Secondary | ICD-10-CM | POA: Diagnosis not present

## 2021-11-08 DIAGNOSIS — Z9181 History of falling: Secondary | ICD-10-CM | POA: Diagnosis not present

## 2021-11-08 DIAGNOSIS — S52501A Unspecified fracture of the lower end of right radius, initial encounter for closed fracture: Secondary | ICD-10-CM | POA: Diagnosis not present

## 2021-11-08 DIAGNOSIS — R0902 Hypoxemia: Secondary | ICD-10-CM | POA: Diagnosis not present

## 2021-11-08 DIAGNOSIS — N179 Acute kidney failure, unspecified: Secondary | ICD-10-CM | POA: Diagnosis present

## 2021-11-08 DIAGNOSIS — M40204 Unspecified kyphosis, thoracic region: Secondary | ICD-10-CM | POA: Diagnosis not present

## 2021-11-08 DIAGNOSIS — S52251A Displaced comminuted fracture of shaft of ulna, right arm, initial encounter for closed fracture: Secondary | ICD-10-CM | POA: Diagnosis not present

## 2021-11-08 DIAGNOSIS — Z96641 Presence of right artificial hip joint: Secondary | ICD-10-CM | POA: Insufficient documentation

## 2021-11-08 DIAGNOSIS — I10 Essential (primary) hypertension: Secondary | ICD-10-CM | POA: Diagnosis present

## 2021-11-08 DIAGNOSIS — I4891 Unspecified atrial fibrillation: Secondary | ICD-10-CM | POA: Diagnosis not present

## 2021-11-08 DIAGNOSIS — M154 Erosive (osteo)arthritis: Secondary | ICD-10-CM | POA: Diagnosis not present

## 2021-11-08 DIAGNOSIS — M8588 Other specified disorders of bone density and structure, other site: Secondary | ICD-10-CM | POA: Diagnosis not present

## 2021-11-08 DIAGNOSIS — M25521 Pain in right elbow: Secondary | ICD-10-CM | POA: Diagnosis not present

## 2021-11-08 DIAGNOSIS — Z7982 Long term (current) use of aspirin: Secondary | ICD-10-CM | POA: Insufficient documentation

## 2021-11-08 DIAGNOSIS — R404 Transient alteration of awareness: Secondary | ICD-10-CM | POA: Diagnosis not present

## 2021-11-08 DIAGNOSIS — W19XXXA Unspecified fall, initial encounter: Secondary | ICD-10-CM

## 2021-11-08 DIAGNOSIS — F01C4 Vascular dementia, severe, with anxiety: Secondary | ICD-10-CM | POA: Diagnosis not present

## 2021-11-08 DIAGNOSIS — K7689 Other specified diseases of liver: Secondary | ICD-10-CM | POA: Diagnosis not present

## 2021-11-08 DIAGNOSIS — M4312 Spondylolisthesis, cervical region: Secondary | ICD-10-CM | POA: Diagnosis not present

## 2021-11-08 DIAGNOSIS — G9340 Encephalopathy, unspecified: Secondary | ICD-10-CM | POA: Diagnosis present

## 2021-11-08 DIAGNOSIS — H903 Sensorineural hearing loss, bilateral: Secondary | ICD-10-CM | POA: Diagnosis present

## 2021-11-08 DIAGNOSIS — K8020 Calculus of gallbladder without cholecystitis without obstruction: Secondary | ICD-10-CM | POA: Diagnosis not present

## 2021-11-08 DIAGNOSIS — K8689 Other specified diseases of pancreas: Secondary | ICD-10-CM | POA: Diagnosis not present

## 2021-11-08 DIAGNOSIS — S52691A Other fracture of lower end of right ulna, initial encounter for closed fracture: Secondary | ICD-10-CM | POA: Insufficient documentation

## 2021-11-08 DIAGNOSIS — M25511 Pain in right shoulder: Secondary | ICD-10-CM | POA: Diagnosis not present

## 2021-11-08 DIAGNOSIS — Z83438 Family history of other disorder of lipoprotein metabolism and other lipidemia: Secondary | ICD-10-CM | POA: Diagnosis not present

## 2021-11-08 DIAGNOSIS — M79641 Pain in right hand: Secondary | ICD-10-CM | POA: Diagnosis not present

## 2021-11-08 DIAGNOSIS — I2510 Atherosclerotic heart disease of native coronary artery without angina pectoris: Secondary | ICD-10-CM | POA: Diagnosis present

## 2021-11-08 DIAGNOSIS — S0003XA Contusion of scalp, initial encounter: Secondary | ICD-10-CM | POA: Diagnosis present

## 2021-11-08 DIAGNOSIS — S52591A Other fractures of lower end of right radius, initial encounter for closed fracture: Secondary | ICD-10-CM | POA: Insufficient documentation

## 2021-11-08 DIAGNOSIS — Y92129 Unspecified place in nursing home as the place of occurrence of the external cause: Secondary | ICD-10-CM | POA: Insufficient documentation

## 2021-11-08 DIAGNOSIS — S299XXA Unspecified injury of thorax, initial encounter: Secondary | ICD-10-CM | POA: Diagnosis not present

## 2021-11-08 DIAGNOSIS — D72829 Elevated white blood cell count, unspecified: Secondary | ICD-10-CM | POA: Diagnosis present

## 2021-11-08 DIAGNOSIS — G319 Degenerative disease of nervous system, unspecified: Secondary | ICD-10-CM | POA: Diagnosis not present

## 2021-11-08 DIAGNOSIS — R000 Tachycardia, unspecified: Secondary | ICD-10-CM | POA: Diagnosis not present

## 2021-11-08 DIAGNOSIS — S52591D Other fractures of lower end of right radius, subsequent encounter for closed fracture with routine healing: Secondary | ICD-10-CM | POA: Diagnosis not present

## 2021-11-08 DIAGNOSIS — R4182 Altered mental status, unspecified: Secondary | ICD-10-CM | POA: Diagnosis not present

## 2021-11-08 DIAGNOSIS — S3991XA Unspecified injury of abdomen, initial encounter: Secondary | ICD-10-CM | POA: Diagnosis not present

## 2021-11-08 DIAGNOSIS — I959 Hypotension, unspecified: Secondary | ICD-10-CM | POA: Diagnosis present

## 2021-11-08 DIAGNOSIS — Z87891 Personal history of nicotine dependence: Secondary | ICD-10-CM | POA: Insufficient documentation

## 2021-11-08 DIAGNOSIS — Z515 Encounter for palliative care: Secondary | ICD-10-CM | POA: Diagnosis not present

## 2021-11-08 DIAGNOSIS — Z800 Family history of malignant neoplasm of digestive organs: Secondary | ICD-10-CM | POA: Diagnosis not present

## 2021-11-08 DIAGNOSIS — Z803 Family history of malignant neoplasm of breast: Secondary | ICD-10-CM | POA: Diagnosis not present

## 2021-11-08 DIAGNOSIS — R4020 Unspecified coma: Secondary | ICD-10-CM | POA: Diagnosis not present

## 2021-11-08 DIAGNOSIS — S199XXA Unspecified injury of neck, initial encounter: Secondary | ICD-10-CM | POA: Diagnosis not present

## 2021-11-08 DIAGNOSIS — Z8042 Family history of malignant neoplasm of prostate: Secondary | ICD-10-CM | POA: Diagnosis not present

## 2021-11-08 DIAGNOSIS — M79601 Pain in right arm: Secondary | ICD-10-CM | POA: Diagnosis not present

## 2021-11-08 DIAGNOSIS — E785 Hyperlipidemia, unspecified: Secondary | ICD-10-CM | POA: Diagnosis present

## 2021-11-08 DIAGNOSIS — R6510 Systemic inflammatory response syndrome (SIRS) of non-infectious origin without acute organ dysfunction: Secondary | ICD-10-CM | POA: Diagnosis present

## 2021-11-08 DIAGNOSIS — Z974 Presence of external hearing-aid: Secondary | ICD-10-CM | POA: Diagnosis not present

## 2021-11-08 DIAGNOSIS — S52601A Unspecified fracture of lower end of right ulna, initial encounter for closed fracture: Secondary | ICD-10-CM | POA: Diagnosis present

## 2021-11-08 LAB — PROTIME-INR
INR: 1 (ref 0.8–1.2)
Prothrombin Time: 13.3 seconds (ref 11.4–15.2)

## 2021-11-08 LAB — LACTIC ACID, PLASMA: Lactic Acid, Venous: 1.2 mmol/L (ref 0.5–1.9)

## 2021-11-08 LAB — COMPREHENSIVE METABOLIC PANEL
ALT: 34 U/L (ref 0–44)
AST: 60 U/L — ABNORMAL HIGH (ref 15–41)
Albumin: 3.7 g/dL (ref 3.5–5.0)
Alkaline Phosphatase: 61 U/L (ref 38–126)
Anion gap: 5 (ref 5–15)
BUN: 21 mg/dL (ref 8–23)
CO2: 28 mmol/L (ref 22–32)
Calcium: 10 mg/dL (ref 8.9–10.3)
Chloride: 102 mmol/L (ref 98–111)
Creatinine, Ser: 0.78 mg/dL (ref 0.44–1.00)
GFR, Estimated: >60 mL/min (ref >60)
Glucose, Bld: 101 mg/dL — ABNORMAL HIGH (ref 70–99)
Potassium: 5.6 mmol/L — ABNORMAL HIGH (ref 3.5–5.1)
Sodium: 135 mmol/L (ref 135–145)
Total Bilirubin: 1.5 mg/dL — ABNORMAL HIGH (ref 0.3–1.2)
Total Protein: 6.7 g/dL (ref 6.5–8.1)

## 2021-11-08 LAB — CBC
HCT: 39.9 % (ref 36.0–46.0)
Hemoglobin: 13.2 g/dL (ref 12.0–15.0)
MCH: 30.6 pg (ref 26.0–34.0)
MCHC: 33.1 g/dL (ref 30.0–36.0)
MCV: 92.6 fL (ref 80.0–100.0)
Platelets: UNDETERMINED 10*3/uL (ref 150–400)
RBC: 4.31 MIL/uL (ref 3.87–5.11)
RDW: 14.3 % (ref 11.5–15.5)
WBC: 9.8 10*3/uL (ref 4.0–10.5)
nRBC: 0 % (ref 0.0–0.2)

## 2021-11-08 LAB — CK: Total CK: 117 U/L (ref 38–234)

## 2021-11-08 LAB — URINALYSIS, ROUTINE W REFLEX MICROSCOPIC
Bilirubin Urine: NEGATIVE
Glucose, UA: NEGATIVE mg/dL
Hgb urine dipstick: NEGATIVE
Ketones, ur: NEGATIVE mg/dL
Leukocytes,Ua: NEGATIVE
Nitrite: NEGATIVE
Protein, ur: NEGATIVE mg/dL
Specific Gravity, Urine: 1.015 (ref 1.005–1.030)
pH: 7.5 (ref 5.0–8.0)

## 2021-11-08 LAB — RESP PANEL BY RT-PCR (FLU A&B, COVID) ARPGX2
Influenza A by PCR: NEGATIVE
Influenza B by PCR: NEGATIVE
SARS Coronavirus 2 by RT PCR: NEGATIVE

## 2021-11-08 MED ORDER — FUROSEMIDE 10 MG/ML IJ SOLN
20.0000 mg | Freq: Once | INTRAMUSCULAR | Status: AC
  Administered 2021-11-08: 15:00:00 20 mg via INTRAVENOUS
  Filled 2021-11-08: qty 2

## 2021-11-08 MED ORDER — SODIUM ZIRCONIUM CYCLOSILICATE 10 G PO PACK
10.0000 g | PACK | Freq: Once | ORAL | Status: AC
  Administered 2021-11-08: 15:00:00 10 g via ORAL
  Filled 2021-11-08: qty 1

## 2021-11-08 MED ORDER — ACETAMINOPHEN 500 MG PO TABS
1000.0000 mg | ORAL_TABLET | Freq: Three times a day (TID) | ORAL | Status: DC | PRN
  Administered 2021-11-08: 22:00:00 1000 mg via ORAL
  Filled 2021-11-08: qty 2

## 2021-11-08 MED ORDER — LORAZEPAM 1 MG PO TABS
0.5000 mg | ORAL_TABLET | Freq: Four times a day (QID) | ORAL | Status: DC | PRN
  Administered 2021-11-08: 22:00:00 0.5 mg via ORAL
  Filled 2021-11-08: qty 1

## 2021-11-08 MED ORDER — SODIUM CHLORIDE 0.9 % IV BOLUS
500.0000 mL | Freq: Once | INTRAVENOUS | Status: AC
  Administered 2021-11-08: 15:00:00 500 mL via INTRAVENOUS

## 2021-11-08 MED ORDER — LACTATED RINGERS IV BOLUS
500.0000 mL | Freq: Once | INTRAVENOUS | Status: AC
  Administered 2021-11-08: 10:00:00 500 mL via INTRAVENOUS

## 2021-11-08 MED ORDER — ATORVASTATIN CALCIUM 10 MG PO TABS
10.0000 mg | ORAL_TABLET | Freq: Every day | ORAL | Status: DC
  Administered 2021-11-08: 22:00:00 10 mg via ORAL
  Filled 2021-11-08: qty 1

## 2021-11-08 MED ORDER — HALOPERIDOL LACTATE 5 MG/ML IJ SOLN
INTRAMUSCULAR | Status: AC
  Administered 2021-11-08: 14:00:00 5 mg
  Filled 2021-11-08: qty 1

## 2021-11-08 MED ORDER — QUETIAPINE FUMARATE 25 MG PO TABS
25.0000 mg | ORAL_TABLET | Freq: Every day | ORAL | Status: DC
  Filled 2021-11-08: qty 1

## 2021-11-08 MED ORDER — FENTANYL CITRATE PF 50 MCG/ML IJ SOSY
25.0000 ug | PREFILLED_SYRINGE | INTRAMUSCULAR | Status: DC | PRN
  Administered 2021-11-08 (×3): 25 ug via INTRAVENOUS
  Filled 2021-11-08 (×2): qty 1

## 2021-11-08 NOTE — ED Triage Notes (Signed)
Pt BIB [facility] from [facility] unit s/p unwitnessed fall. Pt arrives with hematoma to R head, bruising to R shoulder, R wrist deformity. 20 G LAC. 50 mcg Fentanyl en route.

## 2021-11-08 NOTE — Progress Notes (Signed)
Orthopedic Tech Progress Note Patient Details:  Candice Willis June 18, 1924 [id number]  Added exxtra padding, helped EMT with cleaning/changing patient and slid patient up in bed   Ortho Devices Type of Ortho Device: Cotton web roll, Ace wrap, Sugartong splint Ortho Device/Splint Location: RUE Ortho Device/Splint Interventions: Ordered, Application, Adjustment   Post Interventions Patient Tolerated: Fair, Well Instructions Provided: Care of device  Donald Pore 11/08/2021, 11:55 AM

## 2021-11-08 NOTE — ED Notes (Signed)
Attempted X 2 to call report to [facility]. No answer at facility.

## 2021-11-08 NOTE — ED Notes (Signed)
Called niece, Mizuki Hoel, and updated on plan of care to be discharged back to facility.

## 2021-11-08 NOTE — ED Notes (Signed)
Pt resting quietly in bed with eyes closed   Respirations even and unlabored

## 2021-11-08 NOTE — ED Notes (Addendum)
Pt transported to CT ?

## 2021-11-08 NOTE — ED Provider Notes (Signed)
[facility] EMERGENCY DEPARTMENT Provider Note   CSN: [id number] Arrival date & time: 11/08/21  0756     History Chief Complaint  Patient presents with   Marletta Lor    Candice Willis is a 87 y.o. female.   Fall Patient with history of dementia, presents for an unwitnessed fall at skilled nursing facility.  She was recently moved into the memory care unit of this facility.  This is a new environment for her.  Patient often spontaneously gets up and moves around.  Per EMS, this is what happened.  She was attempting to ambulate unassisted.  Staff at the facility noted deformity to her distal right arm as well as a hematoma to her right anterior scalp.  At baseline, patient has advanced dementia.  She is reportedly not on any blood thinning medications.     Past Medical History:  Diagnosis Date   Arteriosclerotic cardiovascular disease    Bee sting reaction 08/23/2016   Tongue swelling and difficulty swallowing /notes 08/23/2016   Constipation    Degenerative joint disease of right hip 10/15/14   Fracture of multiple pubic rami (HCC) 10/15/14   Right inferior and superior pubic rami   HOH (hard of hearing)    Wears bilateral hearing aids   Hyperlipidemia    Hypertension    Macular degeneration    Memory deficit    Osteoporosis    Pain in right foot    Palpitations    Paroxysmal atrial fibrillation (HCC)    Thyroid nodule    Status post surgery   Ulcer of hard palate    Vertigo     Patient Active Problem List   Diagnosis Date Noted   Generalized osteoarthritis of multiple sites 09/02/2021   Multiple fractures of ribs, left side, initial encounter for closed fracture 07/16/2021   Increased liver enzymes 06/28/2021   Hypotension 06/14/2021   Weight loss 06/04/2021   COVID-19 virus infection 05/14/2021   Anxiety due to dementia 03/17/2021   Posterior vitreous detachment, right eye 03/01/2021   Macular pigment epithelial detachment, left 10/19/2020    Accelerated junctional rhythm 06/08/2020   Interstitial lung disease (HCC) 06/05/2020   Altered mental status 06/05/2020   Dysphagia 05/28/2020   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) 05/05/2020   Cystoid macular edema of right eye 05/05/2020   Exudative age-related macular degeneration of left eye with inactive choroidal neovascularization (HCC) 05/05/2020   Shingles 03/04/2020   Lung nodule 01/22/2019   Diarrhea 01/21/2019   Ingestion of corrosive chemical 01/21/2019   Advanced care planning/counseling discussion 05/09/2018   Incontinent of urine 02/26/2018   Urinary frequency 02/19/2018   Generalized weakness 02/19/2018   Benign paroxysmal positional vertigo 01/02/2018   Osteoarthritis of right hip 01/02/2018   Gait abnormality 12/21/2016   Anemia, iron deficiency 12/21/2016   Macular degeneration of both eyes 09/08/2016   Sensorineural hearing loss (SNHL) of both ears 09/08/2016   PVC's (premature ventricular contractions) 01/05/2016   Hyponatremia 09/13/2015   PAF (paroxysmal atrial fibrillation) (HCC) 09/11/2015   Frequent falls 09/10/2015   Right knee pain 06/16/2015   Vitamin D deficiency 10/24/2014   Vascular dementia (HCC)    Thyroid nodule    Hyperlipidemia 10/20/2014   Essential hypertension 10/20/2014   Osteoporosis without current pathological fracture 10/20/2014   Chronic constipation 10/20/2014    Past Surgical History:  Procedure Laterality Date   ABDOMINAL HYSTERECTOMY  1968   APPENDECTOMY  1940's   COLONOSCOPY  FOOT SURGERY Left    HARDWARE REMOVAL Right 02/16/2016   Procedure: HARDWARE REMOVAL RT HIP;  Surgeon: Sheral Apley, MD;  Location: [facility] OR;  Service: Orthopedics;  Laterality: Right;   HIP ARTHROPLASTY Right 02/16/2016   Procedure: ARTHROPLASTY BIPOLAR HIP (HEMIARTHROPLASTY);  Surgeon: Sheral Apley, MD;  Location: [facility] OR;  Service: Orthopedics;  Laterality: Right;    INTRAMEDULLARY (IM) NAIL INTERTROCHANTERIC Right 09/11/2015   Procedure: INTRAMEDULLARY (IM) NAIL INTERTROCHANTRIC;  Surgeon: Sheral Apley, MD;  Location: [facility] OR;  Service: Orthopedics;  Laterality: Right;   KNEE SURGERY       OB History   No obstetric history on file.     Family History  Problem Relation Age of Onset   Heart disease Father    Cancer Father        prostate   Cancer Maternal Grandmother        colon   Heart disease Paternal Grandmother    Hyperlipidemia Sister    Cancer Sister        breast   Heart disease Brother    Hyperlipidemia Brother    Cancer Brother        prostate    Social History   Tobacco Use   Smoking status: Former    Packs/day: 0.50    Types: Cigarettes    Quit date: 10/05/1971    Years since quitting: 50.1   Smokeless tobacco: Never  Vaping Use   Vaping Use: Never used  Substance Use Topics   Alcohol use: Not Currently    Comment: 1-4 per week; eats 10 raisins daily soaked in Gin   Drug use: No    Home Medications Prior to Admission medications   Medication Sig Start Date End Date Taking? Authorizing Provider  acetaminophen (TYLENOL) 325 MG tablet Take 650 mg by mouth every 4 (four) hours as needed for mild pain.   Yes [provider]  aspirin 81 MG chewable tablet Chew 81 mg by mouth daily.   Yes [provider]  atorvastatin (LIPITOR) 10 MG tablet Take 10 mg by mouth daily.   Yes [provider]  Biotin 10 MG TABS Take 10 mg by mouth daily.   Yes [provider]  calcium carbonate (OSCAL) 1500 (600 Ca) MG TABS tablet Take 600 mg of elemental calcium by mouth 2 (two) times daily with a meal.   Yes [provider]  Cholecalciferol (VITAMIN D3) 2000 units TABS Take 2,000 Units by mouth daily.   Yes [provider]  EPINEPHrine 0.3 mg/0.3 mL IJ SOAJ injection Inject 0.3 mLs (0.3 mg total) into the muscle once as needed (anaphylaxis/allergic reaction). Patient taking  differently: Inject 0.3 mg into the muscle daily as needed (anaphylaxis/allergic reaction). 08/24/16  Yes Diallo, Abdoulaye, MD  lactose free nutrition (BOOST) LIQD Take 237 mLs by mouth daily.   Yes [provider]  LORazepam (ATIVAN) 0.5 MG tablet Take 0.5 mg by mouth every 6 (six) hours as needed for anxiety. 11/05/21 11/08/21 Yes [provider]  Melatonin 3 MG TABS Take 3 mg by mouth at bedtime as needed (sleep).   Yes [provider]  memantine (NAMENDA) 10 MG tablet Take 10 mg by mouth 2 (two) times daily.    Yes [provider]  Menthol, Topical Analgesic, (BIOFREEZE) 4 % GEL Apply topically 4 (four) times daily as needed.   Yes [provider]  Multiple Vitamins-Minerals (PRESERVISION AREDS) TABS Take 2 tablets by mouth daily.   Yes [provider]  Multiple Vitamins-Minerals (THEREMS-M) TABS Take 1 tablet by mouth daily.   Yes [provider]  polyethylene glycol (MIRALAX / GLYCOLAX) packet Take 17 g by mouth every other day.   Yes [provider]  QUEtiapine (SEROQUEL) 25 MG tablet Take 25 mg by mouth at bedtime.   Yes [provider]  sennosides-docusate sodium (SENOKOT-S) 8.6-50 MG tablet Take 2 tablets by mouth at bedtime.   Yes [provider]  sertraline (ZOLOFT) 50 MG tablet Take 75 mg by mouth daily. 04/09/21  Yes [provider]    Allergies    Patient has no known allergies.  Review of Systems   Review of Systems  Unable to perform ROS: Dementia   Physical Exam Updated Vital Signs BP 131/74    Pulse 66    Temp 98.6 F (37 C) (Oral)    Resp 16    SpO2 98%   Physical Exam Vitals and nursing note reviewed.  Constitutional:      General: She is not in acute distress.    Appearance: Normal appearance. She is well-developed. She is not ill-appearing, toxic-appearing or diaphoretic.  HENT:     Head: Normocephalic.     Comments: Hematoma to right anterior scalp    Right Ear:  External ear normal.     Left Ear: External ear normal.     Nose: Nose normal.     Mouth/Throat:     Mouth: Mucous membranes are moist.     Pharynx: Oropharynx is clear.  Eyes:     General: No scleral icterus.    Extraocular Movements: Extraocular movements intact.     Conjunctiva/sclera: Conjunctivae normal.  Cardiovascular:     Rate and Rhythm: Normal rate and regular rhythm.     Heart sounds: No murmur heard. Pulmonary:     Effort: Pulmonary effort is normal. No respiratory distress.     Breath sounds: Normal breath sounds. No wheezing or rales.  Chest:     Chest wall: No tenderness.  Abdominal:     Palpations: Abdomen is soft.     Tenderness: There is no abdominal tenderness.  Musculoskeletal:        General: Swelling, tenderness, deformity and signs of injury present.     Cervical back: Normal range of motion and neck supple. No rigidity or tenderness.     Comments: Right wrist injury  Skin:    General: Skin is warm and dry.     Capillary Refill: Capillary refill takes less than 2 seconds.     Findings: Bruising present.  Neurological:     General: No focal deficit present.     Mental Status: She is alert. Mental status is at baseline. She is disoriented.     Cranial Nerves: No dysarthria or facial asymmetry.     Motor: No abnormal muscle tone.  Psychiatric:        Mood and Affect: Mood normal.        Behavior: Behavior normal.    ED Results / Procedures / Treatments   Labs (all labs ordered are listed, but only abnormal results are displayed) Labs Reviewed  COMPREHENSIVE METABOLIC PANEL - Abnormal; Notable for the following components:      Result Value   Potassium 5.6 (*)    Glucose, Bld 101 (*)    AST 60 (*)    Total Bilirubin 1.5 (*)    All other components within normal limits  RESP PANEL BY RT-PCR (FLU A&B, COVID) ARPGX2  CBC  PROTIME-INR  LACTIC ACID, PLASMA  CK  URINALYSIS, ROUTINE W REFLEX MICROSCOPIC    EKG EKG  Interpretation  Date/Time:  Monday November 08 2021 10:03:11 EST Ventricular Rate:  58 PR Interval:  265 QRS Duration: 86 QT Interval:  461 QTC Calculation: 453 R Axis:   82 Text Interpretation: Sinus or ectopic atrial rhythm Prolonged PR interval Borderline right axis deviation Confirmed by Gloris Manchester [phone]) on 11/08/2021 10:12:32 AM  Radiology DG Shoulder Right  Result Date: 11/08/2021 CLINICAL DATA:  Pain post fall EXAM: RIGHT SHOULDER - 2+ VIEW COMPARISON:  06/03/2020 FINDINGS: No fracture or dislocation. Mild diffuse osteopenia. Chronic calcifications in the rotator cuff. Right carotid arterial calcifications incidentally noted. IMPRESSION: No acute findings Electronically Signed   By: Corlis Leak M.D.   On: 11/08/2021 09:18   DG Elbow Complete Right  Result Date: 11/08/2021 CLINICAL DATA:  Pain post fall EXAM: RIGHT ELBOW - COMPLETE 3+ VIEW COMPARISON:  None. FINDINGS: No fracture, dislocation, or effusions. Chondrocalcinosis . No significant osseous degenerative change. Regional soft tissues unremarkable. IMPRESSION: No acute findings. Electronically Signed   By: Corlis Leak M.D.   On: 11/08/2021 09:19   DG Forearm Right  Result Date: 11/08/2021 CLINICAL DATA:  Pain post fall EXAM: RIGHT FOREARM - 2 VIEW COMPARISON:  None. FINDINGS: Comminuted distal ulnar shaft fracture with at least 3 mm dorsal displacement and angulation of the distal fracture fragment. Comminuted distal radial shaft fracture with approximately 7 mm dorsal displacement of the distal fracture fragment, with posterior angulation of the distal radial articular surface. Proximal radius and ulna unremarkable. Degenerative changes in the carpus, better evaluated on hand and wrist radiographs. IMPRESSION: Comminuted displaced distal radial and ulnar fractures. There is dorsal angulation of the distal radial articular surface. Electronically Signed   By: Corlis Leak M.D.   On: 11/08/2021 09:23   DG Wrist Complete  Right  Result Date: 11/08/2021 CLINICAL DATA:  Pain post fall EXAM: RIGHT WRIST - COMPLETE 3+ VIEW COMPARISON:  None. FINDINGS: Comminuted distal ulnar shaft fracture with at least 4 mm dorsal displacement and angulation of the distal fracture fragment. Comminuted distal radial shaft fracture with approximately 7 mm dorsal displacement of the distal fracture fragment, with posterior angulation of the distal radial articular surface. There is probably intra-articular extension of the fracture to the radiocarpal joint, with at least 1 mm step-off deformity. Carpal rows intact. Cystic degenerative changes in the lunate, scaphoid, and triquetrum. Advanced DJD at the STT and first [facility] articulations. Diffuse osteopenia. IMPRESSION: Comminuted angulated distal radial and ulnar fractures. Dorsal angulation of the distal radial articular surface, with probable intra-articular fracture extension, minimal step-off deformity. Electronically Signed   By: Corlis Leak M.D.   On: 11/08/2021 09:29   CT HEAD WO CONTRAST  Result Date: 11/08/2021 CLINICAL DATA:  Fall EXAM: CT HEAD WITHOUT CONTRAST TECHNIQUE: Contiguous axial images were obtained from the base of the skull through the vertex without intravenous contrast. COMPARISON:  12/23/2016 FINDINGS: Brain: There is atrophy and chronic small vessel disease changes. No acute intracranial abnormality. Specifically, no hemorrhage, hydrocephalus, mass lesion, acute infarction, or significant intracranial injury. Vascular: No hyperdense vessel or unexpected calcification. Skull: No acute calvarial abnormality. Sinuses/Orbits: No acute findings Other: Soft tissue swelling/hematoma in the lateral right forehead. IMPRESSION: Atrophy, chronic microvascular disease. No acute intracranial abnormality. Electronically Signed   By: Charlett Nose M.D.   On: 11/08/2021 09:57   CT CERVICAL SPINE WO CONTRAST  Result Date: 11/08/2021 CLINICAL DATA:  Fall.  Neck trauma (Age >=  65y) EXAM: CT  CERVICAL SPINE WITHOUT CONTRAST TECHNIQUE: Multidetector CT imaging of the cervical spine was performed without intravenous contrast. Multiplanar CT image reconstructions were also generated. COMPARISON:  None. FINDINGS: Alignment: Slight multilevel degenerative anterolisthesis at C3-4, C4-5. Skull base and vertebrae: No acute fracture. No primary bone lesion or focal pathologic process. Soft tissues and spinal canal: No prevertebral fluid or swelling. No visible canal hematoma. Disc levels: Moderate to advanced diffuse degenerative disc disease and facet disease bilaterally. Upper chest: Biapical scarring.  No acute findings. Other: None IMPRESSION: Diffuse moderate to advanced degenerative disc and facet disease. No acute bony abnormality. Electronically Signed   By: Charlett Nose M.D.   On: 11/08/2021 09:58   DG Hand 2 View Right  Result Date: 11/08/2021 CLINICAL DATA:  Pain post fall EXAM: RIGHT HAND - 2 VIEW COMPARISON:  None. FINDINGS: Diffuse osteopenia. Advanced erosive DJD at the first carpometacarpal articulation, with regional dystrophic soft tissue calcification. Carpal rows intact. Distal radial and ulnar fractures are partially visualized. IMPRESSION: 1. Distal radial and ulnar fractures, incompletely characterized. 2. Diffuse osteopenia with advanced first [facility] DJD. Electronically Signed   By: Corlis Leak M.D.   On: 11/08/2021 09:25   CT T-SPINE NO CHARGE  Result Date: 11/08/2021 CLINICAL DATA:  Unwitnessed fall. EXAM: CT Thoracic and Lumbar spine without contrast TECHNIQUE: Multiplanar CT images of the thoracic and lumbar spine were reconstructed from contemporary CT of the Chest, Abdomen, and Pelvis CONTRAST:  None COMPARISON:  None FINDINGS: CT THORACIC SPINE FINDINGS Alignment: Exaggerated thoracic kyphosis. No traumatic malalignment. Vertebrae: No acute fracture. No incidental bone lesion. Subjective osteopenia Paraspinal and other soft tissues: Reported separately Disc levels: Less than  typical degenerative changes for age. No visible impingement. CT LUMBAR SPINE FINDINGS Alignment: No traumatic malalignment Vertebrae: No acute lumbar spine fracture or bone lesion. Sclerosis and subtle fracture deformity to the bilateral sacral ala and across the S2 body. No evidence of aggressive bone lesion. Paraspinal and other soft tissues: Reported separately on source images. Disc levels: Ordinary degenerative changes. IMPRESSION: 1. No acute finding in the thoracic or lumbar spine. 2. Sacral insufficiency fractures which appear remote. Electronically Signed   By: Tiburcio Pea M.D.   On: 11/08/2021 10:05   CT L-SPINE NO CHARGE  Result Date: 11/08/2021 CLINICAL DATA:  Unwitnessed fall. EXAM: CT Thoracic and Lumbar spine without contrast TECHNIQUE: Multiplanar CT images of the thoracic and lumbar spine were reconstructed from contemporary CT of the Chest, Abdomen, and Pelvis CONTRAST:  None COMPARISON:  None FINDINGS: CT THORACIC SPINE FINDINGS Alignment: Exaggerated thoracic kyphosis. No traumatic malalignment. Vertebrae: No acute fracture. No incidental bone lesion. Subjective osteopenia Paraspinal and other soft tissues: Reported separately Disc levels: Less than typical degenerative changes for age. No visible impingement. CT LUMBAR SPINE FINDINGS Alignment: No traumatic malalignment Vertebrae: No acute lumbar spine fracture or bone lesion. Sclerosis and subtle fracture deformity to the bilateral sacral ala and across the S2 body. No evidence of aggressive bone lesion. Paraspinal and other soft tissues: Reported separately on source images. Disc levels: Ordinary degenerative changes. IMPRESSION: 1. No acute finding in the thoracic or lumbar spine. 2. Sacral insufficiency fractures which appear remote. Electronically Signed   By: Tiburcio Pea M.D.   On: 11/08/2021 10:05   CT CHEST ABDOMEN PELVIS WO CONTRAST  Result Date: 11/08/2021 CLINICAL DATA:  Polytrauma, blunt.  Fall.  Right shoulder  pain EXAM: CT CHEST, ABDOMEN AND PELVIS WITHOUT CONTRAST TECHNIQUE: Multidetector CT imaging of the chest, abdomen and pelvis  was performed following the standard protocol without IV contrast. COMPARISON:  01/21/2019 FINDINGS: CT CHEST FINDINGS Cardiovascular: Moderate aortic calcifications and scattered coronary artery calcifications. Heart is normal size. Aorta is normal caliber. Mediastinum/Nodes: No mediastinal, hilar, or axillary adenopathy. Trachea and esophagus are unremarkable. 12 mm low-density nodule in the left thyroid lobe. Not clinically significant; no follow-up imaging recommended (ref: J Am Coll Radiol. 2015 Feb;12(2): 143-50). Lungs/Pleura: Biapical scarring. Small clustered nodules posteriorly in the right middle lobe, likely inflammatory. Subpleural nodule in the left lower lobe measures 3 mm and is unchanged since prior study. No no effusions or pneumothorax. Musculoskeletal: Chest wall soft tissues are unremarkable. No acute bony abnormality. CT ABDOMEN PELVIS FINDINGS Hepatobiliary: Layering gallstones within the gallbladder. Small scattered hypodensities in the liver, difficult to characterize on this noncontrast study. No evidence of hepatic injury or perihepatic hematoma. Pancreas: Atrophy.  No focal abnormality or ductal dilatation. Spleen: No splenic injury or perisplenic hematoma. Adrenals/Urinary Tract: No adrenal hemorrhage or renal injury identified. Bladder is unremarkable. Stomach/Bowel: Large stool burden in the colon, particularly rectosigmoid colon. Stomach, large and small bowel grossly unremarkable. Vascular/Lymphatic: Diffuse aortoiliac atherosclerosis. No evidence of aneurysm or adenopathy. Reproductive: Prior hysterectomy.  No adnexal masses. Other: No free fluid or free air. Musculoskeletal: Prior right hip replacement. Deformity of the right superior and inferior pubic rami related to old fractures. No acute bony abnormality. IMPRESSION: No evidence of significant traumatic  injury in the chest, abdomen or pelvis. Aortic atherosclerosis, coronary artery disease. No acute cardiopulmonary disease. Cholelithiasis. Large stool burden in the colon, particularly rectosigmoid colon. Cannot exclude fecal impaction. Electronically Signed   By: Charlett Nose M.D.   On: 11/08/2021 10:03    Procedures Procedures   Medications Ordered in ED Medications  fentaNYL (SUBLIMAZE) injection 25 mcg (25 mcg Intravenous Given 11/08/21 1420)  lactated ringers bolus 500 mL (0 mLs Intravenous Stopped 11/08/21 1204)  haloperidol lactate (HALDOL) 5 MG/ML injection (5 mg  Given 11/08/21 1330)  furosemide (LASIX) injection 20 mg (20 mg Intravenous Given 11/08/21 1508)  sodium chloride 0.9 % bolus 500 mL (500 mLs Intravenous New Bag/Given 11/08/21 1508)  sodium zirconium cyclosilicate (LOKELMA) packet 10 g (10 g Oral Given 11/08/21 1508)    ED Course  I have reviewed the triage vital signs and the nursing notes.  Pertinent labs & imaging results that were available during my care of the patient were reviewed by me and considered in my medical decision making (see chart for details).    MDM Rules/Calculators/A&P                         Patient is a 87 year old female presenting after an unwitnessed fall at the memory care unit of her skilled nursing facility.  On arrival, vital signs are notable for hypertension.  Fentanyl as needed was ordered for management of acute pain.  Patient's blood pressure did improve with management of pain.  Imaging studies revealed a fracture to the distal right radius and ulna.  Fracture is comminuted and mildly displaced.  I did discuss the results of imaging with orthopedic surgery.  They agree that no reduction is indicated and injury can be splinted as is.  Sugar-tong splint was applied.  Sling was provided.  Patient's lab work was notable for hyperkalemia.  IV fluids, Lasix, and Lokelma were ordered.  Patient also had an incidental finding of rectal stool  impaction on her imaging studies.  Manual disimpaction was performed at bedside.  At  time of signout, urinalysis studies are pending acquisition of the sample.  Care of patient was signed out to oncoming ED provider.   Final Clinical Impression(s) / ED Diagnoses Final diagnoses:  Fall  Radius/ulna fracture, right, closed, initial encounter    Rx / DC Orders ED Discharge Orders     None        Gloris Manchester, MD 11/08/21 1542

## 2021-11-08 NOTE — ED Notes (Signed)
Jolynne Spurgin (Niece) called and ask for an update on Cristen. Call back at [phone]

## 2021-11-08 NOTE — ED Notes (Signed)
Pt is restless, confused, requested pt home medications and pain meds from EDP

## 2021-11-08 NOTE — ED Notes (Signed)
Fall risk band place on left arm

## 2021-11-08 NOTE — ED Notes (Signed)
[facility] was called, 17 in list

## 2021-11-08 NOTE — Discharge Instructions (Addendum)
When you fell, you sustained a fracture to your distal radius and ulna of your right arm.  This is the area of your wrist.  A splint has been applied.  You will need to follow-up with an orthopedic surgeon to ensure good healing.  Please call the number below to schedule a follow-up appointment soon as possible.  Today, you had also had impacted stool in your rectum.  Increase use of stool softeners and use enemas as needed.    For pain control you may take at 1000 mg of Tylenol every 8 hours scheduled.

## 2021-11-08 NOTE — ED Notes (Signed)
Pt transported to CT and X-ray via stretcher.  

## 2021-11-09 ENCOUNTER — Encounter: Payer: Self-pay | Admitting: Internal Medicine

## 2021-11-09 ENCOUNTER — Non-Acute Institutional Stay (SKILLED_NURSING_FACILITY): Payer: Medicare PPO | Admitting: Internal Medicine

## 2021-11-09 DIAGNOSIS — E782 Mixed hyperlipidemia: Secondary | ICD-10-CM

## 2021-11-09 DIAGNOSIS — I480 Paroxysmal atrial fibrillation: Secondary | ICD-10-CM | POA: Diagnosis not present

## 2021-11-09 DIAGNOSIS — S52591D Other fractures of lower end of right radius, subsequent encounter for closed fracture with routine healing: Secondary | ICD-10-CM | POA: Diagnosis not present

## 2021-11-09 DIAGNOSIS — Z9181 History of falling: Secondary | ICD-10-CM | POA: Diagnosis not present

## 2021-11-09 DIAGNOSIS — I10 Essential (primary) hypertension: Secondary | ICD-10-CM

## 2021-11-09 DIAGNOSIS — S52691D Other fracture of lower end of right ulna, subsequent encounter for closed fracture with routine healing: Secondary | ICD-10-CM | POA: Diagnosis not present

## 2021-11-09 DIAGNOSIS — F01C4 Vascular dementia, severe, with anxiety: Secondary | ICD-10-CM | POA: Diagnosis not present

## 2021-11-09 DIAGNOSIS — F03911 Unspecified dementia, unspecified severity, with agitation: Secondary | ICD-10-CM

## 2021-11-09 MED ORDER — HYDROCODONE-ACETAMINOPHEN 5-325 MG PO TABS: 0.5000 | ORAL_TABLET | Freq: Four times a day (QID) | ORAL | 0 refills | Status: AC | PRN

## 2021-11-09 MED ORDER — LORAZEPAM 0.5 MG PO TABS: 0.5000 mg | ORAL_TABLET | Freq: Three times a day (TID) | ORAL | 0 refills | Status: AC | PRN

## 2021-11-09 NOTE — Progress Notes (Signed)
Provider:  Einar Crow MD Location:   [facility] Room Number: [id number] Place of Service:  SNF (31)  PCP: Mast, Man X, NP Patient Care Team: Mast, Man X, NP as PCP - General (Internal Medicine) Nahser, Deloris Ping, MD as PCP - Cardiology (Cardiology) Venita Lick, MD as Consulting Physician (Orthopedic Surgery) Marykay Lex, MD as Consulting Physician (Cardiology) Mast, Man X, NP as Nurse Practitioner (Internal Medicine)  Extended Emergency Contact Information Primary Emergency Contact: [name],Justine Address: [address]           [place], [place] [address] [place] Home Phone: [phone] Mobile Phone: [phone] Relation: Niece Secondary Emergency Contact: [name],[name]  [place] Mobile Phone: [phone] Relation: Significant other  Code Status: DNR Managed Care Goals of Care: Advanced Directive information Advanced Directives 11/09/2021  Does Patient Have a Medical Advance Directive? Yes  Type of Estate agent of [place];Living will;Out of facility DNR (pink MOST or yellow form)  Does patient want to make changes to medical advance directive? No - Patient declined  Copy of Healthcare Power of Attorney in Chart? Yes - validated most recent copy scanned in chart (See row information)  Would patient like information on creating a medical advance directive? -  Pre-existing out of facility DNR order (yellow form or pink MOST form) Yellow form placed in chart (order not valid for inpatient use);Pink MOST form placed in chart (order not valid for inpatient use)      Chief Complaint  Patient presents with   Readmit To SNF    Readmission to SNF    HPI: Patient is a 87 y.o. female seen today for Readmission to SNF After coming back from ED for Right Distal Ulna and Radial Comminuted Fracture    She has h/o Hypertension, Hyperlipidemia, Cognitive Impairment Most likely Alzheimer dementia with Recent  Behavior sissues BPPV, Vit D def, Hyponatremia,  Macular degeneration Accelerated Junctional Rhythm   Recently moved to memory unit due ot her risk of Elopement Larey Seat when trying to walk with no walker Found to have Radial Ulnar Comminuted Fracture Splinted for now She is very anxious Trying to push the staff Trying to walk  Denies any Pain in her Arm  Very Confused    Past Medical History:  Diagnosis Date   Arteriosclerotic cardiovascular disease    Bee sting reaction 08/23/2016   Tongue swelling and difficulty swallowing /notes 08/23/2016   Constipation    Degenerative joint disease of right hip 10/15/14   Fracture of multiple pubic rami (HCC) 10/15/14   Right inferior and superior pubic rami   HOH (hard of hearing)    Wears bilateral hearing aids   Hyperlipidemia    Hypertension    Macular degeneration    Memory deficit    Osteoporosis    Pain in right foot    Palpitations    Paroxysmal atrial fibrillation (HCC)    Thyroid nodule    Status post surgery   Ulcer of hard palate    Vertigo    Past Surgical History:  Procedure Laterality Date   ABDOMINAL HYSTERECTOMY  1968   APPENDECTOMY  1940's   COLONOSCOPY     FOOT SURGERY Left    HARDWARE REMOVAL Right 02/16/2016   Procedure: HARDWARE REMOVAL RT HIP;  Surgeon: Sheral Apley, MD;  Location: [facility] OR;  Service: Orthopedics;  Laterality: Right;   HIP ARTHROPLASTY Right 02/16/2016   Procedure: ARTHROPLASTY BIPOLAR HIP (HEMIARTHROPLASTY);  Surgeon: Sheral Apley, MD;  Location: [facility] OR;  Service: Orthopedics;  Laterality: Right;   INTRAMEDULLARY (IM) NAIL INTERTROCHANTERIC Right 09/11/2015   Procedure: INTRAMEDULLARY (IM) NAIL INTERTROCHANTRIC;  Surgeon: Sheral Apley, MD;  Location: [facility] OR;  Service: Orthopedics;  Laterality: Right;   KNEE SURGERY      reports that she quit smoking about 50 years ago. Her smoking use included cigarettes. She smoked an average of .5 packs per day. She has never used smokeless tobacco.  She reports that she does not currently use alcohol. She reports that she does not use drugs. Social History   Socioeconomic History   Marital status: Single    Spouse name: Not on file   Number of children: 0   Years of education: 16+   Highest education level: Not on file  Occupational History   Occupation: Retired   Tobacco Use   Smoking status: Former    Packs/day: 0.50    Types: Cigarettes    Quit date: 10/05/1971    Years since quitting: 50.1   Smokeless tobacco: Never  Vaping Use   Vaping Use: Never used  Substance and Sexual Activity   Alcohol use: Not Currently    Comment: 1-4 per week; eats 10 raisins daily soaked in Gin   Drug use: No   Sexual activity: Never  Other Topics Concern   Not on file  Social History Narrative   Lives at [facility]. Moved to [place] 07/06/16   Never married   Caffeine use: 2 cups coffee per day   No tea/soda   Former smoker 1972   Alcohol 1-4 per week. Eats 10 raisins daily soaked in Gin   DNR   Social Determinants of Health   Financial Resource Strain: Not on file  Food Insecurity: Not on file  Transportation Needs: Not on file  Physical Activity: Not on file  Stress: Not on file  Social Connections: Not on file  Intimate Partner Violence: Not on file    Functional Status Survey:    Family History  Problem Relation Age of Onset   Heart disease Father    Cancer Father        prostate   Cancer Maternal Grandmother        colon   Heart disease Paternal Grandmother    Hyperlipidemia Sister    Cancer Sister        breast   Heart disease Brother    Hyperlipidemia Brother    Cancer Brother        prostate    Health Maintenance  Topic Date Due   Zoster Vaccines- Shingrix (1 of 2) Never done   Pneumonia Vaccine 109+ Years old (2 - PPSV23 if available, else PCV20) 10/23/2019   TETANUS/TDAP  10/07/2022   INFLUENZA VACCINE  Completed   DEXA SCAN  Completed   COVID-19 Vaccine  Completed   HPV VACCINES  Aged Out     No Known Allergies  Allergies as of 11/09/2021   No Known Allergies      Medication List        Accurate as of November 09, 2021 11:10 AM. If you have any questions, ask your nurse or doctor.          STOP taking these medications    acetaminophen 325 MG tablet Commonly known as: TYLENOL Stopped by: Mahlon Gammon, MD       TAKE these medications    aspirin 81 MG chewable tablet Chew 81 mg by mouth daily.   atorvastatin 10  MG tablet Commonly known as: LIPITOR Take 10 mg by mouth daily.   Biofreeze 4 % Gel Generic drug: Menthol (Topical Analgesic) Apply topically 4 (four) times daily as needed.   Biotin 10 MG Tabs Take 10 mg by mouth daily.   calcium carbonate 1500 (600 Ca) MG Tabs tablet Commonly known as: OSCAL Take 600 mg of elemental calcium by mouth 2 (two) times daily with a meal.   EPINEPHrine 0.3 mg/0.3 mL Soaj injection Commonly known as: EPI-PEN Inject 0.3 mLs (0.3 mg total) into the muscle once as needed (anaphylaxis/allergic reaction). What changed: when to take this   lactose free nutrition Liqd Take 237 mLs by mouth daily.   melatonin 3 MG Tabs tablet Take 3 mg by mouth at bedtime as needed (sleep).   memantine 10 MG tablet Commonly known as: NAMENDA Take 10 mg by mouth 2 (two) times daily.   polyethylene glycol 17 g packet Commonly known as: MIRALAX / GLYCOLAX Take 17 g by mouth every other day.   QUEtiapine 25 MG tablet Commonly known as: SEROQUEL Take 25 mg by mouth at bedtime.   sennosides-docusate sodium 8.6-50 MG tablet Commonly known as: SENOKOT-S Take 2 tablets by mouth at bedtime.   sertraline 50 MG tablet Commonly known as: ZOLOFT Take 75 mg by mouth daily.   Therems-M Tabs Take 1 tablet by mouth daily.   PreserVision AREDS Tabs Take 2 tablets by mouth daily.   Vitamin D3 50 MCG (2000 UT) Tabs Take 2,000 Units by mouth daily.        Review of Systems  Unable to perform ROS: Dementia   Constitutional:  Positive for activity change. Negative for appetite change.  HENT: Negative.    Respiratory:  Negative for cough and shortness of breath.   Cardiovascular:  Negative for leg swelling.  Gastrointestinal:  Negative for constipation.  Genitourinary: Negative.   Musculoskeletal:  Positive for arthralgias, gait problem and myalgias.  Skin: Negative.   Neurological:  Positive for weakness. Negative for dizziness.  Psychiatric/Behavioral:  Positive for confusion. Negative for dysphoric mood and sleep disturbance. The patient is nervous/anxious.    Vitals:   11/09/21 1057  BP: 128/67  Pulse: 64  Resp: 18  Temp: 98.1 F (36.7 C)  SpO2: 99%  Weight: 118 lb 4.8 oz (53.7 kg)  Height:  (1.549 m)   Body mass index is 22.35 kg/m. Physical Exam Vitals reviewed.  Constitutional:      Appearance: Normal appearance.  HENT:     Head: Normocephalic.     Nose: Nose normal.     Mouth/Throat:     Mouth: Mucous membranes are moist.     Pharynx: Oropharynx is clear.  Eyes:     Pupils: Pupils are equal, round, and reactive to light.  Cardiovascular:     Rate and Rhythm: Normal rate and regular rhythm.     Pulses: Normal pulses.     Heart sounds: Normal heart sounds. No murmur heard. Pulmonary:     Effort: Pulmonary effort is normal.     Breath sounds: Normal breath sounds.  Abdominal:     General: Abdomen is flat. Bowel sounds are normal.     Palpations: Abdomen is soft.  Musculoskeletal:        General: No swelling.     Cervical back: Neck supple.  Skin:    General: Skin is warm.  Neurological:     General: No focal deficit present.     Mental Status: She is alert.  Comments: Upset trying to get up  Right arm in Splint  Psychiatric:        Mood and Affect: Mood normal.        Thought Content: Thought content normal.    Labs reviewed: Basic Metabolic Panel: Recent Labs    07/06/21 0000 07/20/21 0000 11/08/21 0845  NA 137 137 135  K 4.2 4.1 5.6*   CL 104 104 102  CO2 26* 26* 28  GLUCOSE  --   --  101*  BUN 26* 23* 21  CREATININE 0.8 0.7 0.78  CALCIUM 9.5 9.6 10.0   Liver Function Tests: Recent Labs    07/06/21 0000 07/20/21 0000 11/08/21 0845  AST 29 24 60*  ALT 34 21 34  ALKPHOS 98 81 61  BILITOT  --   --  1.5*  PROT  --   --  6.7  ALBUMIN 3.5 3.6 3.7   No results for input(s): LIPASE, AMYLASE in the last 8760 hours. No results for input(s): AMMONIA in the last 8760 hours. CBC: Recent Labs    03/19/21 1221 04/09/21 0000 06/08/21 0000 11/08/21 0845  WBC 6.9 5.6 7.1 9.8  NEUTROABS 5,099.00 3,730.00 5,254.00  --   HGB 13.0 11.7* 12.9 13.2  HCT 38 35* 39 39.9  MCV  --   --   --  92.6  PLT 251 237 306 PLATELET CLUMPS NOTED ON SMEAR, UNABLE TO ESTIMATE   Cardiac Enzymes: Recent Labs    11/08/21 0845  CKTOTAL 117   BNP: Invalid input(s): POCBNP Lab Results  Component Value Date   HGBA1C 5.4 03/14/2018   Lab Results  Component Value Date   TSH 2.703 06/03/2020   No results found for: VITAMINB12 No results found for: FOLATE No results found for: IRON, TIBC, FERRITIN  Imaging and Procedures obtained prior to SNF admission: DG Shoulder Right  Result Date: 11/08/2021 CLINICAL DATA:  Pain post fall EXAM: RIGHT SHOULDER - 2+ VIEW COMPARISON:  06/03/2020 FINDINGS: No fracture or dislocation. Mild diffuse osteopenia. Chronic calcifications in the rotator cuff. Right carotid arterial calcifications incidentally noted. IMPRESSION: No acute findings Electronically Signed   By: Corlis Leak M.D.   On: 11/08/2021 09:18   DG Elbow Complete Right  Result Date: 11/08/2021 CLINICAL DATA:  Pain post fall EXAM: RIGHT ELBOW - COMPLETE 3+ VIEW COMPARISON:  None. FINDINGS: No fracture, dislocation, or effusions. Chondrocalcinosis . No significant osseous degenerative change. Regional soft tissues unremarkable. IMPRESSION: No acute findings. Electronically Signed   By: Corlis Leak M.D.   On: 11/08/2021 09:19   DG Forearm  Right  Result Date: 11/08/2021 CLINICAL DATA:  Pain post fall EXAM: RIGHT FOREARM - 2 VIEW COMPARISON:  None. FINDINGS: Comminuted distal ulnar shaft fracture with at least 3 mm dorsal displacement and angulation of the distal fracture fragment. Comminuted distal radial shaft fracture with approximately 7 mm dorsal displacement of the distal fracture fragment, with posterior angulation of the distal radial articular surface. Proximal radius and ulna unremarkable. Degenerative changes in the carpus, better evaluated on hand and wrist radiographs. IMPRESSION: Comminuted displaced distal radial and ulnar fractures. There is dorsal angulation of the distal radial articular surface. Electronically Signed   By: Corlis Leak M.D.   On: 11/08/2021 09:23   DG Wrist Complete Right  Result Date: 11/08/2021 CLINICAL DATA:  Pain post fall EXAM: RIGHT WRIST - COMPLETE 3+ VIEW COMPARISON:  None. FINDINGS: Comminuted distal ulnar shaft fracture with at least 4 mm dorsal displacement and angulation of the distal fracture  fragment. Comminuted distal radial shaft fracture with approximately 7 mm dorsal displacement of the distal fracture fragment, with posterior angulation of the distal radial articular surface. There is probably intra-articular extension of the fracture to the radiocarpal joint, with at least 1 mm step-off deformity. Carpal rows intact. Cystic degenerative changes in the lunate, scaphoid, and triquetrum. Advanced DJD at the STT and first [facility] articulations. Diffuse osteopenia. IMPRESSION: Comminuted angulated distal radial and ulnar fractures. Dorsal angulation of the distal radial articular surface, with probable intra-articular fracture extension, minimal step-off deformity. Electronically Signed   By: Corlis Leak M.D.   On: 11/08/2021 09:29   CT HEAD WO CONTRAST  Result Date: 11/08/2021 CLINICAL DATA:  Fall EXAM: CT HEAD WITHOUT CONTRAST TECHNIQUE: Contiguous axial images were obtained from the base of  the skull through the vertex without intravenous contrast. COMPARISON:  12/23/2016 FINDINGS: Brain: There is atrophy and chronic small vessel disease changes. No acute intracranial abnormality. Specifically, no hemorrhage, hydrocephalus, mass lesion, acute infarction, or significant intracranial injury. Vascular: No hyperdense vessel or unexpected calcification. Skull: No acute calvarial abnormality. Sinuses/Orbits: No acute findings Other: Soft tissue swelling/hematoma in the lateral right forehead. IMPRESSION: Atrophy, chronic microvascular disease. No acute intracranial abnormality. Electronically Signed   By: Charlett Nose M.D.   On: 11/08/2021 09:57   CT CERVICAL SPINE WO CONTRAST  Result Date: 11/08/2021 CLINICAL DATA:  Fall.  Neck trauma (Age >= 65y) EXAM: CT CERVICAL SPINE WITHOUT CONTRAST TECHNIQUE: Multidetector CT imaging of the cervical spine was performed without intravenous contrast. Multiplanar CT image reconstructions were also generated. COMPARISON:  None. FINDINGS: Alignment: Slight multilevel degenerative anterolisthesis at C3-4, C4-5. Skull base and vertebrae: No acute fracture. No primary bone lesion or focal pathologic process. Soft tissues and spinal canal: No prevertebral fluid or swelling. No visible canal hematoma. Disc levels: Moderate to advanced diffuse degenerative disc disease and facet disease bilaterally. Upper chest: Biapical scarring.  No acute findings. Other: None IMPRESSION: Diffuse moderate to advanced degenerative disc and facet disease. No acute bony abnormality. Electronically Signed   By: Charlett Nose M.D.   On: 11/08/2021 09:58   DG Hand 2 View Right  Result Date: 11/08/2021 CLINICAL DATA:  Pain post fall EXAM: RIGHT HAND - 2 VIEW COMPARISON:  None. FINDINGS: Diffuse osteopenia. Advanced erosive DJD at the first carpometacarpal articulation, with regional dystrophic soft tissue calcification. Carpal rows intact. Distal radial and ulnar fractures are partially  visualized. IMPRESSION: 1. Distal radial and ulnar fractures, incompletely characterized. 2. Diffuse osteopenia with advanced first [facility] DJD. Electronically Signed   By: Corlis Leak M.D.   On: 11/08/2021 09:25   CT T-SPINE NO CHARGE  Result Date: 11/08/2021 CLINICAL DATA:  Unwitnessed fall. EXAM: CT Thoracic and Lumbar spine without contrast TECHNIQUE: Multiplanar CT images of the thoracic and lumbar spine were reconstructed from contemporary CT of the Chest, Abdomen, and Pelvis CONTRAST:  None COMPARISON:  None FINDINGS: CT THORACIC SPINE FINDINGS Alignment: Exaggerated thoracic kyphosis. No traumatic malalignment. Vertebrae: No acute fracture. No incidental bone lesion. Subjective osteopenia Paraspinal and other soft tissues: Reported separately Disc levels: Less than typical degenerative changes for age. No visible impingement. CT LUMBAR SPINE FINDINGS Alignment: No traumatic malalignment Vertebrae: No acute lumbar spine fracture or bone lesion. Sclerosis and subtle fracture deformity to the bilateral sacral ala and across the S2 body. No evidence of aggressive bone lesion. Paraspinal and other soft tissues: Reported separately on source images. Disc levels: Ordinary degenerative changes. IMPRESSION: 1. No acute finding in the thoracic or  lumbar spine. 2. Sacral insufficiency fractures which appear remote. Electronically Signed   By: Tiburcio Pea M.D.   On: 11/08/2021 10:05   CT L-SPINE NO CHARGE  Result Date: 11/08/2021 CLINICAL DATA:  Unwitnessed fall. EXAM: CT Thoracic and Lumbar spine without contrast TECHNIQUE: Multiplanar CT images of the thoracic and lumbar spine were reconstructed from contemporary CT of the Chest, Abdomen, and Pelvis CONTRAST:  None COMPARISON:  None FINDINGS: CT THORACIC SPINE FINDINGS Alignment: Exaggerated thoracic kyphosis. No traumatic malalignment. Vertebrae: No acute fracture. No incidental bone lesion. Subjective osteopenia Paraspinal and other soft tissues: Reported  separately Disc levels: Less than typical degenerative changes for age. No visible impingement. CT LUMBAR SPINE FINDINGS Alignment: No traumatic malalignment Vertebrae: No acute lumbar spine fracture or bone lesion. Sclerosis and subtle fracture deformity to the bilateral sacral ala and across the S2 body. No evidence of aggressive bone lesion. Paraspinal and other soft tissues: Reported separately on source images. Disc levels: Ordinary degenerative changes. IMPRESSION: 1. No acute finding in the thoracic or lumbar spine. 2. Sacral insufficiency fractures which appear remote. Electronically Signed   By: Tiburcio Pea M.D.   On: 11/08/2021 10:05   CT CHEST ABDOMEN PELVIS WO CONTRAST  Result Date: 11/08/2021 CLINICAL DATA:  Polytrauma, blunt.  Fall.  Right shoulder pain EXAM: CT CHEST, ABDOMEN AND PELVIS WITHOUT CONTRAST TECHNIQUE: Multidetector CT imaging of the chest, abdomen and pelvis was performed following the standard protocol without IV contrast. COMPARISON:  01/21/2019 FINDINGS: CT CHEST FINDINGS Cardiovascular: Moderate aortic calcifications and scattered coronary artery calcifications. Heart is normal size. Aorta is normal caliber. Mediastinum/Nodes: No mediastinal, hilar, or axillary adenopathy. Trachea and esophagus are unremarkable. 12 mm low-density nodule in the left thyroid lobe. Not clinically significant; no follow-up imaging recommended (ref: J Am Coll Radiol. 2015 Feb;12(2): 143-50). Lungs/Pleura: Biapical scarring. Small clustered nodules posteriorly in the right middle lobe, likely inflammatory. Subpleural nodule in the left lower lobe measures 3 mm and is unchanged since prior study. No no effusions or pneumothorax. Musculoskeletal: Chest wall soft tissues are unremarkable. No acute bony abnormality. CT ABDOMEN PELVIS FINDINGS Hepatobiliary: Layering gallstones within the gallbladder. Small scattered hypodensities in the liver, difficult to characterize on this noncontrast study. No  evidence of hepatic injury or perihepatic hematoma. Pancreas: Atrophy.  No focal abnormality or ductal dilatation. Spleen: No splenic injury or perisplenic hematoma. Adrenals/Urinary Tract: No adrenal hemorrhage or renal injury identified. Bladder is unremarkable. Stomach/Bowel: Large stool burden in the colon, particularly rectosigmoid colon. Stomach, large and small bowel grossly unremarkable. Vascular/Lymphatic: Diffuse aortoiliac atherosclerosis. No evidence of aneurysm or adenopathy. Reproductive: Prior hysterectomy.  No adnexal masses. Other: No free fluid or free air. Musculoskeletal: Prior right hip replacement. Deformity of the right superior and inferior pubic rami related to old fractures. No acute bony abnormality. IMPRESSION: No evidence of significant traumatic injury in the chest, abdomen or pelvis. Aortic atherosclerosis, coronary artery disease. No acute cardiopulmonary disease. Cholelithiasis. Large stool burden in the colon, particularly rectosigmoid colon. Cannot exclude fecal impaction. Electronically Signed   By: Charlett Nose M.D.   On: 11/08/2021 10:03    Assessment/Plan  closed fracture of distal end of right ulna and Radius with routine healing, subsequent encounter Splinted for now Will order Norco Prn fo rpain control Will start working ewith therapy for gait    At risk for falls Work with therapy  Agitation due to dementia Ativan Prn  Also start her on Vistaril 25 mg in the morning for behaviors  Severe vascular dementia  with anxiety Ativan On Namenda Also oN seroquel in pm    Mixed hyperlipidemia Continue statin   Essential hypertension Stable    Family/ staff Communication:   Labs/tests ordered:

## 2021-11-09 NOTE — ED Notes (Signed)
Complete linen change performed, clean brief and purewick applied to pt by this RN.

## 2021-11-09 NOTE — ED Notes (Signed)
RN attempted to call report to [facility] w/ no success. RN left voicemail

## 2021-11-09 NOTE — ED Notes (Signed)
Patient is curently 4th on the list for pick up through [facility]

## 2021-11-10 ENCOUNTER — Encounter (HOSPITAL_COMMUNITY): Payer: Self-pay | Admitting: Internal Medicine

## 2021-11-10 ENCOUNTER — Other Ambulatory Visit: Payer: Self-pay | Admitting: Orthopedic Surgery

## 2021-11-10 ENCOUNTER — Inpatient Hospital Stay (HOSPITAL_COMMUNITY)
Admission: EM | Admit: 2021-11-10 | Discharge: 2021-11-21 | DRG: 951 | Disposition: E | Payer: Medicare PPO | Attending: Internal Medicine | Admitting: Internal Medicine

## 2021-11-10 DIAGNOSIS — F0390 Unspecified dementia without behavioral disturbance: Secondary | ICD-10-CM | POA: Diagnosis present

## 2021-11-10 DIAGNOSIS — S0003XA Contusion of scalp, initial encounter: Secondary | ICD-10-CM | POA: Diagnosis present

## 2021-11-10 DIAGNOSIS — Z8042 Family history of malignant neoplasm of prostate: Secondary | ICD-10-CM

## 2021-11-10 DIAGNOSIS — I480 Paroxysmal atrial fibrillation: Secondary | ICD-10-CM | POA: Diagnosis present

## 2021-11-10 DIAGNOSIS — H903 Sensorineural hearing loss, bilateral: Secondary | ICD-10-CM | POA: Diagnosis present

## 2021-11-10 DIAGNOSIS — Z800 Family history of malignant neoplasm of digestive organs: Secondary | ICD-10-CM | POA: Diagnosis not present

## 2021-11-10 DIAGNOSIS — Z7982 Long term (current) use of aspirin: Secondary | ICD-10-CM

## 2021-11-10 DIAGNOSIS — R6510 Systemic inflammatory response syndrome (SIRS) of non-infectious origin without acute organ dysfunction: Secondary | ICD-10-CM | POA: Diagnosis present

## 2021-11-10 DIAGNOSIS — I10 Essential (primary) hypertension: Secondary | ICD-10-CM | POA: Diagnosis present

## 2021-11-10 DIAGNOSIS — W19XXXA Unspecified fall, initial encounter: Secondary | ICD-10-CM | POA: Diagnosis present

## 2021-11-10 DIAGNOSIS — E785 Hyperlipidemia, unspecified: Secondary | ICD-10-CM | POA: Diagnosis present

## 2021-11-10 DIAGNOSIS — H3530 Unspecified macular degeneration: Secondary | ICD-10-CM | POA: Diagnosis present

## 2021-11-10 DIAGNOSIS — Z8616 Personal history of COVID-19: Secondary | ICD-10-CM

## 2021-11-10 DIAGNOSIS — R4182 Altered mental status, unspecified: Secondary | ICD-10-CM

## 2021-11-10 DIAGNOSIS — Z20822 Contact with and (suspected) exposure to covid-19: Secondary | ICD-10-CM | POA: Diagnosis present

## 2021-11-10 DIAGNOSIS — Z66 Do not resuscitate: Secondary | ICD-10-CM | POA: Diagnosis present

## 2021-11-10 DIAGNOSIS — I959 Hypotension, unspecified: Secondary | ICD-10-CM | POA: Diagnosis present

## 2021-11-10 DIAGNOSIS — Z515 Encounter for palliative care: Principal | ICD-10-CM

## 2021-11-10 DIAGNOSIS — Z96641 Presence of right artificial hip joint: Secondary | ICD-10-CM | POA: Diagnosis present

## 2021-11-10 DIAGNOSIS — S52601A Unspecified fracture of lower end of right ulna, initial encounter for closed fracture: Secondary | ICD-10-CM | POA: Diagnosis present

## 2021-11-10 DIAGNOSIS — I2510 Atherosclerotic heart disease of native coronary artery without angina pectoris: Secondary | ICD-10-CM | POA: Diagnosis present

## 2021-11-10 DIAGNOSIS — D72829 Elevated white blood cell count, unspecified: Secondary | ICD-10-CM | POA: Diagnosis present

## 2021-11-10 DIAGNOSIS — Z803 Family history of malignant neoplasm of breast: Secondary | ICD-10-CM | POA: Diagnosis not present

## 2021-11-10 DIAGNOSIS — Z79899 Other long term (current) drug therapy: Secondary | ICD-10-CM

## 2021-11-10 DIAGNOSIS — N179 Acute kidney failure, unspecified: Secondary | ICD-10-CM | POA: Diagnosis present

## 2021-11-10 DIAGNOSIS — G9340 Encephalopathy, unspecified: Secondary | ICD-10-CM | POA: Diagnosis present

## 2021-11-10 DIAGNOSIS — Z8249 Family history of ischemic heart disease and other diseases of the circulatory system: Secondary | ICD-10-CM

## 2021-11-10 DIAGNOSIS — Z974 Presence of external hearing-aid: Secondary | ICD-10-CM | POA: Diagnosis not present

## 2021-11-10 DIAGNOSIS — Z83438 Family history of other disorder of lipoprotein metabolism and other lipidemia: Secondary | ICD-10-CM

## 2021-11-10 LAB — TROPONIN I (HIGH SENSITIVITY): Troponin I (High Sensitivity): 135 ng/L (ref <18)

## 2021-11-10 LAB — CBC WITH DIFFERENTIAL/PLATELET
Abs Immature Granulocytes: 0 10*3/uL (ref 0.00–0.07)
Basophils Absolute: 0.1 10*3/uL (ref 0.0–0.1)
Basophils Relative: 1 %
Eosinophils Absolute: 0 10*3/uL (ref 0.0–0.5)
Eosinophils Relative: 0 %
HCT: 40.7 % (ref 36.0–46.0)
Hemoglobin: 13.6 g/dL (ref 12.0–15.0)
Lymphocytes Relative: 4 %
Lymphs Abs: 0.5 10*3/uL — ABNORMAL LOW (ref 0.7–4.0)
MCH: 31.3 pg (ref 26.0–34.0)
MCHC: 33.4 g/dL (ref 30.0–36.0)
MCV: 93.8 fL (ref 80.0–100.0)
Monocytes Absolute: 1.8 10*3/uL — ABNORMAL HIGH (ref 0.1–1.0)
Monocytes Relative: 14 %
Neutro Abs: 10.6 10*3/uL — ABNORMAL HIGH (ref 1.7–7.7)
Neutrophils Relative %: 81 %
Platelets: 169 10*3/uL (ref 150–400)
RBC: 4.34 MIL/uL (ref 3.87–5.11)
RDW: 14.9 % (ref 11.5–15.5)
WBC: 13.1 10*3/uL — ABNORMAL HIGH (ref 4.0–10.5)
nRBC: 0 % (ref 0.0–0.2)
nRBC: 0 /100 WBC

## 2021-11-10 LAB — BASIC METABOLIC PANEL
Anion gap: 24 — ABNORMAL HIGH (ref 5–15)
BUN: 55 mg/dL — ABNORMAL HIGH (ref 8–23)
CO2: 14 mmol/L — ABNORMAL LOW (ref 22–32)
Calcium: 9.9 mg/dL (ref 8.9–10.3)
Chloride: 100 mmol/L (ref 98–111)
Creatinine, Ser: 3.08 mg/dL — ABNORMAL HIGH (ref 0.44–1.00)
GFR, Estimated: 13 mL/min — ABNORMAL LOW (ref >60)
Glucose, Bld: 83 mg/dL (ref 70–99)
Potassium: 3.6 mmol/L (ref 3.5–5.1)
Sodium: 138 mmol/L (ref 135–145)

## 2021-11-10 LAB — HEPATIC FUNCTION PANEL
ALT: 38 U/L (ref 0–44)
AST: 77 U/L — ABNORMAL HIGH (ref 15–41)
Albumin: 3.2 g/dL — ABNORMAL LOW (ref 3.5–5.0)
Alkaline Phosphatase: 71 U/L (ref 38–126)
Bilirubin, Direct: 0.5 mg/dL — ABNORMAL HIGH (ref 0.0–0.2)
Indirect Bilirubin: 1 mg/dL — ABNORMAL HIGH (ref 0.3–0.9)
Total Bilirubin: 1.5 mg/dL — ABNORMAL HIGH (ref 0.3–1.2)
Total Protein: 6.5 g/dL (ref 6.5–8.1)

## 2021-11-10 LAB — RESP PANEL BY RT-PCR (FLU A&B, COVID) ARPGX2
Influenza A by PCR: NEGATIVE
Influenza B by PCR: NEGATIVE
SARS Coronavirus 2 by RT PCR: NEGATIVE

## 2021-11-10 LAB — LACTIC ACID, PLASMA: Lactic Acid, Venous: >9 mmol/L (ref 0.5–1.9)

## 2021-11-10 MED ORDER — LORAZEPAM 2 MG/ML IJ SOLN: 1.0000 mg | INTRAMUSCULAR | Status: DC | PRN

## 2021-11-10 MED ORDER — ACETAMINOPHEN 325 MG PO TABS: 650.0000 mg | ORAL_TABLET | Freq: Four times a day (QID) | ORAL | Status: DC | PRN

## 2021-11-10 MED ORDER — MORPHINE SULFATE (PF) 2 MG/ML IV SOLN: 1.0000 mg | INTRAVENOUS | Status: DC | PRN

## 2021-11-10 MED ORDER — ACETAMINOPHEN 650 MG RE SUPP: 650.0000 mg | Freq: Four times a day (QID) | RECTAL | Status: DC | PRN

## 2021-11-10 MED ORDER — LORAZEPAM 1 MG PO TABS: 1.0000 mg | ORAL_TABLET | ORAL | Status: DC | PRN

## 2021-11-10 MED ORDER — LORAZEPAM 2 MG/ML PO CONC: 1.0000 mg | ORAL | Status: DC | PRN

## 2021-11-10 NOTE — ED Triage Notes (Signed)
Pt from [facility], unresponsive. Unwitnessed fall Monday, hematoma to R head, R arm in splint. Pt was found on couch unresponsive, left alone for unknown period of time. RR 40, 190/120, PERRLA, afebrile. IV L foot

## 2021-11-10 NOTE — ED Provider Notes (Signed)
[facility] EMERGENCY DEPARTMENT Provider Note   CSN: [id number] Arrival date & time: 10/23/2021  1926     History Chief Complaint  Patient presents with   unresponsive   Altered Mental Status    Candice Willis is a 87 y.o. female.  Has durable DNR.  Hyperlipidemia, hypertension, A. fib, dementia.  Presents to ER from nursing home.  Per EMS report, patient was found unresponsive.  Unclear her last known well.  Patient noted to be initially hypertensive, very tachypneic, low end-tidal CO2 (12).  Lengthy goals of care discussion with patient's POA, niece, Candice Willis.  She confirms DNR status.  States patient has had significant functional decline lately.  Would not want any aggressive care and given the current clinical status, she would like to pursue comfort measures only.  Wants her focus to be on minimizing suffering.  HPI     Past Medical History:  Diagnosis Date   Arteriosclerotic cardiovascular disease    Bee sting reaction 08/23/2016   Tongue swelling and difficulty swallowing /notes 08/23/2016   Constipation    Degenerative joint disease of right hip 10/15/14   Fracture of multiple pubic rami (HCC) 10/15/14   Right inferior and superior pubic rami   HOH (hard of hearing)    Wears bilateral hearing aids   Hyperlipidemia    Hypertension    Macular degeneration    Memory deficit    Osteoporosis    Pain in right foot    Palpitations    Paroxysmal atrial fibrillation (HCC)    Thyroid nodule    Status post surgery   Ulcer of hard palate    Vertigo     Patient Active Problem List   Diagnosis Date Noted   Acute encephalopathy 11/14/2021   Generalized osteoarthritis of multiple sites 09/02/2021   Multiple fractures of ribs, left side, initial encounter for closed fracture 07/16/2021   Increased liver enzymes 06/28/2021   Hypotension 06/14/2021   Weight loss 06/04/2021   COVID-19 virus infection 05/14/2021   Anxiety due to dementia  03/17/2021   Posterior vitreous detachment, right eye 03/01/2021   Macular pigment epithelial detachment, left 10/19/2020   Accelerated junctional rhythm 06/08/2020   Interstitial lung disease (HCC) 06/05/2020   Altered mental status 06/05/2020   Dysphagia 05/28/2020   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) 05/05/2020   Cystoid macular edema of right eye 05/05/2020   Exudative age-related macular degeneration of left eye with inactive choroidal neovascularization (HCC) 05/05/2020   Shingles 03/04/2020   Lung nodule 01/22/2019   Diarrhea 01/21/2019   Ingestion of corrosive chemical 01/21/2019   Advanced care planning/counseling discussion 05/09/2018   Incontinent of urine 02/26/2018   Urinary frequency 02/19/2018   Generalized weakness 02/19/2018   Benign paroxysmal positional vertigo 01/02/2018   Osteoarthritis of right hip 01/02/2018   Gait abnormality 12/21/2016   Anemia, iron deficiency 12/21/2016   Macular degeneration of both eyes 09/08/2016   Sensorineural hearing loss (SNHL) of both ears 09/08/2016   PVC's (premature ventricular contractions) 01/05/2016   Hyponatremia 09/13/2015   PAF (paroxysmal atrial fibrillation) (HCC) 09/11/2015   Frequent falls 09/10/2015   Right knee pain 06/16/2015   Vitamin D deficiency 10/24/2014   Vascular dementia (HCC)    Thyroid nodule    Hyperlipidemia 10/20/2014   Essential hypertension 10/20/2014   Osteoporosis without current pathological fracture 10/20/2014   Chronic constipation 10/20/2014    Past Surgical History:  Procedure Laterality Date   ABDOMINAL  HYSTERECTOMY  1968   APPENDECTOMY  1940's   COLONOSCOPY     FOOT SURGERY Left    HARDWARE REMOVAL Right 02/16/2016   Procedure: HARDWARE REMOVAL RT HIP;  Surgeon: Sheral Apley, MD;  Location: [facility] OR;  Service: Orthopedics;  Laterality: Right;   HIP ARTHROPLASTY Right 02/16/2016   Procedure: ARTHROPLASTY BIPOLAR  HIP (HEMIARTHROPLASTY);  Surgeon: Sheral Apley, MD;  Location: [facility] OR;  Service: Orthopedics;  Laterality: Right;   INTRAMEDULLARY (IM) NAIL INTERTROCHANTERIC Right 09/11/2015   Procedure: INTRAMEDULLARY (IM) NAIL INTERTROCHANTRIC;  Surgeon: Sheral Apley, MD;  Location: [facility] OR;  Service: Orthopedics;  Laterality: Right;   KNEE SURGERY       OB History   No obstetric history on file.     Family History  Problem Relation Age of Onset   Heart disease Father    Cancer Father        prostate   Cancer Maternal Grandmother        colon   Heart disease Paternal Grandmother    Hyperlipidemia Sister    Cancer Sister        breast   Heart disease Brother    Hyperlipidemia Brother    Cancer Brother        prostate    Social History   Tobacco Use   Smoking status: Former    Packs/day: 0.50    Types: Cigarettes    Quit date: 10/05/1971    Years since quitting: 50.1   Smokeless tobacco: Never  Vaping Use   Vaping Use: Never used  Substance Use Topics   Alcohol use: Not Currently    Comment: 1-4 per week; eats 10 raisins daily soaked in Gin   Drug use: No    Home Medications Prior to Admission medications   Medication Sig Start Date End Date Taking? Authorizing Provider  acetaminophen (TYLENOL) 325 MG tablet Take 650 mg by mouth every 4 (four) hours as needed for moderate pain, fever or headache.   Yes [provider]  aspirin 81 MG chewable tablet Chew 81 mg by mouth daily.   Yes [provider]  atorvastatin (LIPITOR) 10 MG tablet Take 10 mg by mouth daily.   Yes [provider]  Biotin 10 MG TABS Take 10 mg by mouth daily.   Yes [provider]  calcium carbonate (OSCAL) 1500 (600 Ca) MG TABS tablet Take 600 mg of elemental calcium by mouth 2 (two) times daily with a meal.   Yes [provider]  Cholecalciferol (VITAMIN D3) 2000 units TABS Take 2,000 Units by mouth daily.   Yes [provider]  EPINEPHrine 0.3 mg/0.3  mL IJ SOAJ injection Inject 0.3 mLs (0.3 mg total) into the muscle once as needed (anaphylaxis/allergic reaction). Patient taking differently: Inject 0.3 mg into the muscle daily as needed (anaphylaxis/allergic reaction). 08/24/16  Yes Diallo, Abdoulaye, MD  HYDROcodone-acetaminophen (NORCO) 5-325 MG tablet Take 0.5 tablets by mouth every 6 (six) hours as needed for moderate pain. 11/09/21  Yes Mahlon Gammon, MD  lactose free nutrition (BOOST) LIQD Take 237 mLs by mouth 3 (three) times daily with meals.   Yes [provider]  LORazepam (ATIVAN) 0.5 MG tablet Take 1 tablet (0.5 mg total) by mouth every 8 (eight) hours as needed for anxiety. 11/09/21  Yes Mahlon Gammon, MD  Melatonin 3 MG TABS Take 3 mg by mouth at bedtime as needed (sleep).   Yes [provider]  memantine (NAMENDA) 10 MG tablet Take  10 mg by mouth 2 (two) times daily.    Yes [provider]  Menthol, Topical Analgesic, (BIOFREEZE) 4 % GEL Apply 1 application topically 4 (four) times daily as needed (knee/shoulder pain).   Yes [provider]  Multiple Vitamins-Minerals (PRESERVISION AREDS) TABS Take 2 tablets by mouth daily.   Yes [provider]  Multiple Vitamins-Minerals (THEREMS-M) TABS Take 1 tablet by mouth daily.   Yes [provider]  QUEtiapine (SEROQUEL) 25 MG tablet Take 25 mg by mouth at bedtime.   Yes [provider]  sennosides-docusate sodium (SENOKOT-S) 8.6-50 MG tablet Take 2 tablets by mouth at bedtime.   Yes [provider]  sertraline (ZOLOFT) 50 MG tablet Take 75 mg by mouth daily. 04/09/21  Yes [provider]  hydrOXYzine (VISTARIL) 25 MG capsule Take 25 mg by mouth every morning. Patient not taking: Reported on 11/20/2021    [provider]  polyethylene glycol (MIRALAX / GLYCOLAX) packet Take 17 g by mouth every other day.    [provider]    Allergies    Patient has no known allergies.  Review of  Systems   Review of Systems  Unable to perform ROS: Dementia   Physical Exam Updated Vital Signs BP (!) 68/44    Pulse (!) 51    Temp (!) 97.5 F (36.4 C) (Temporal)    Resp (!) 32    SpO2 94%   Physical Exam Vitals and nursing note reviewed.  Constitutional:      Appearance: She is well-developed.     Comments: unresponsive, lying in the stretcher  HENT:     Head: Normocephalic and atraumatic.  Eyes:     Comments: Pupils equal round and reactive bilaterally  Cardiovascular:     Rate and Rhythm: Regular rhythm. Tachycardia present.     Heart sounds: No murmur heard. Pulmonary:     Comments: Very tachypneic, labored breathing, breath sounds present bilaterally Abdominal:     Palpations: Abdomen is soft.     Tenderness: There is no abdominal tenderness.  Musculoskeletal:     Comments: Right upper extremity splint intact  Skin:    General: Skin is warm and dry.     Capillary Refill: Capillary refill takes less than 2 seconds.  Neurological:     GCS: GCS eye subscore is 1. GCS verbal subscore is 1. GCS motor subscore is 4.     Comments: Eyes do not open to painful stimuli, no verbal response to painful stimuli, withdraws from pain in left lower extremity and bilateral upper extremities  Psychiatric:        Mood and Affect: Mood normal.    ED Results / Procedures / Treatments   Labs (all labs ordered are listed, but only abnormal results are displayed) Labs Reviewed  CBC WITH DIFFERENTIAL/PLATELET - Abnormal; Notable for the following components:      Result Value   WBC 13.1 (*)    Neutro Abs 10.6 (*)    Lymphs Abs 0.5 (*)    Monocytes Absolute 1.8 (*)    All other components within normal limits  BASIC METABOLIC PANEL - Abnormal; Notable for the following components:   CO2 14 (*)    BUN 55 (*)    Creatinine, Ser 3.08 (*)    GFR, Estimated 13 (*)    Anion gap 24 (*)    All other components within normal limits  LACTIC ACID, PLASMA - Abnormal; Notable for the  following components:   Lactic Acid, Venous >  9.0 (*)    All other components within normal limits  HEPATIC FUNCTION PANEL - Abnormal; Notable for the following components:   Albumin 3.2 (*)    AST 77 (*)    Total Bilirubin 1.5 (*)    Bilirubin, Direct 0.5 (*)    Indirect Bilirubin 1.0 (*)    All other components within normal limits  TROPONIN I (HIGH SENSITIVITY) - Abnormal; Notable for the following components:   Troponin I (High Sensitivity) 135 (*)    All other components within normal limits  RESP PANEL BY RT-PCR (FLU A&B, COVID) [id number]    EKG EKG Interpretation  Date/Time:  Wednesday November 10 2021 19:38:21 EST Ventricular Rate:  141 PR Interval:  235 QRS Duration: 107 QT Interval:  337 QTC Calculation: 517 R Axis:   68 Text Interpretation: Supraventricular tachycardia Incomplete left bundle branch block Low voltage, precordial leads Confirmed by Marianna Fuss ([id number]) on 11/09/2021 8:01:08 PM  Radiology No results found.  Procedures .Critical Care Performed by: Milagros Loll, MD Authorized by: Milagros Loll, MD   Critical care provider statement:    Critical care time (minutes):  44   Critical care was necessary to treat or prevent imminent or life-threatening deterioration of the following conditions:  Cardiac failure, circulatory failure, CNS failure or compromise, respiratory failure, sepsis and shock   Critical care was time spent personally by me on the following activities:  Development of treatment plan with patient or surrogate, discussions with consultants, evaluation of patient's response to treatment, examination of patient, ordering and review of laboratory studies, ordering and review of radiographic studies, ordering and performing treatments and interventions, pulse oximetry, re-evaluation of patient's condition and review of old charts   Medications Ordered in ED Medications  acetaminophen (TYLENOL) tablet 650 mg (has no administration in  time range)    Or  acetaminophen (TYLENOL) suppository 650 mg (has no administration in time range)  LORazepam (ATIVAN) tablet 1 mg (has no administration in time range)    Or  LORazepam (ATIVAN) 2 MG/ML concentrated solution 1 mg (has no administration in time range)    Or  LORazepam (ATIVAN) injection 1 mg (has no administration in time range)  morphine 2 MG/ML injection 1 mg (has no administration in time range)    ED Course  I have reviewed the triage vital signs and the nursing notes.  Pertinent labs & imaging results that were available during my care of the patient were reviewed by me and considered in my medical decision making (see chart for details).    MDM Rules/Calculators/A&P                         87 year old lady presented to ER with concern for unresponsiveness.  History of advanced dementia from nursing facility.  Notably had recent fall, extensive trauma work-up was completed at the time and she was found to have right arm fracture, placed in splint sent back to facility.  On arrival to ER today patient is obtunded, GCS 6, hypotensive, tachycardic, tachypneic, low end-tidal CO2.  Noted she seemed to withdraw from from left leg more than right leg.  Patient appears to be critically ill; has very broad differential at this time including but not limited to sepsis, stroke, etc.  Due to her very poor clinical appearance at present, I engaged in goals of care conversation with patient's niece, Candice Willis.  She does not want any aggressive medical interventions performed.  She would like  [place] to pursue only comfort measures at this time.  Based on patient's current clinical appearance and my review of her chart, feel that this is a very reasonable plan.  Discussed case with Dr. Toniann Fail who will admit for comfort measures.    Final Clinical Impression(s) / ED Diagnoses Final diagnoses:  Altered mental status, unspecified altered mental status type  Dementia, unspecified  dementia severity, unspecified dementia type, unspecified whether behavioral, psychotic, or mood disturbance or anxiety [facility])    Rx / DC Orders ED Discharge Orders     None        Milagros Loll, MD 10/24/2021 [id number]

## 2021-11-10 NOTE — H&P (Signed)
History and Physical    Candice Willis:[id number] DOB: 01-Nov-1924 DOA: 11/17/2021  PCP: Mast, Man X, NP  Patient coming from: [facility].  Chief Complaint: Unresponsiveness.  HPI: Candice Willis is a 87 y.o. female with history of dementia recently moved to memory care unit, hypertension, atrial fibrillation who was brought to the ER 3 days ago after patient had an unwitnessed fall at that time work-up in the ER showed patient had a right distal radius and ulna fracture and was discharged back with splint after discussing with orthopedics.  During that visit patient also had stool disimpaction.  Today patient was noticed to be unresponsive and the period of time when patient became unresponsive was unknown.  Patient was brought to the ER.  ED Course: In the ER patient was hypotensive tachycardic with blood work showing acute renal failure and leukocytosis.  Patient's niece Ms. Kahliya Campuzano who is the next of kin and decision maker advised that patient to be placed only on comfort measures.  Patient was started on Ativan and morphine admitted for comfort measures.  Review of Systems: As per HPI, rest all negative.   Past Medical History:  Diagnosis Date   Arteriosclerotic cardiovascular disease    Bee sting reaction 08/23/2016   Tongue swelling and difficulty swallowing /notes 08/23/2016   Constipation    Degenerative joint disease of right hip 10/15/14   Fracture of multiple pubic rami (HCC) 10/15/14   Right inferior and superior pubic rami   HOH (hard of hearing)    Wears bilateral hearing aids   Hyperlipidemia    Hypertension    Macular degeneration    Memory deficit    Osteoporosis    Pain in right foot    Palpitations    Paroxysmal atrial fibrillation (HCC)    Thyroid nodule    Status post surgery   Ulcer of hard palate    Vertigo     Past Surgical History:  Procedure Laterality Date   ABDOMINAL HYSTERECTOMY  1968   APPENDECTOMY  1940's   COLONOSCOPY      FOOT SURGERY Left    HARDWARE REMOVAL Right 02/16/2016   Procedure: HARDWARE REMOVAL RT HIP;  Surgeon: Sheral Apley, MD;  Location: [facility] OR;  Service: Orthopedics;  Laterality: Right;   HIP ARTHROPLASTY Right 02/16/2016   Procedure: ARTHROPLASTY BIPOLAR HIP (HEMIARTHROPLASTY);  Surgeon: Sheral Apley, MD;  Location: [facility] OR;  Service: Orthopedics;  Laterality: Right;   INTRAMEDULLARY (IM) NAIL INTERTROCHANTERIC Right 09/11/2015   Procedure: INTRAMEDULLARY (IM) NAIL INTERTROCHANTRIC;  Surgeon: Sheral Apley, MD;  Location: [facility] OR;  Service: Orthopedics;  Laterality: Right;   KNEE SURGERY       reports that she quit smoking about 50 years ago. Her smoking use included cigarettes. She smoked an average of .5 packs per day. She has never used smokeless tobacco. She reports that she does not currently use alcohol. She reports that she does not use drugs.  No Known Allergies  Family History  Problem Relation Age of Onset   Heart disease Father    Cancer Father        prostate   Cancer Maternal Grandmother        colon   Heart disease Paternal Grandmother    Hyperlipidemia Sister    Cancer Sister        breast   Heart disease Brother    Hyperlipidemia Brother    Cancer Brother        prostate  Prior to Admission medications   Medication Sig Start Date End Date Taking? Authorizing Provider  aspirin 81 MG chewable tablet Chew 81 mg by mouth daily.    [provider]  atorvastatin (LIPITOR) 10 MG tablet Take 10 mg by mouth daily.    [provider]  Biotin 10 MG TABS Take 10 mg by mouth daily.    [provider]  calcium carbonate (OSCAL) 1500 (600 Ca) MG TABS tablet Take 600 mg of elemental calcium by mouth 2 (two) times daily with a meal.    [provider]  Cholecalciferol (VITAMIN D3) 2000 units TABS Take 2,000 Units by mouth daily.    [provider]  EPINEPHrine 0.3 mg/0.3 mL IJ SOAJ injection Inject 0.3 mLs (0.3 mg total) into the  muscle once as needed (anaphylaxis/allergic reaction). Patient taking differently: Inject 0.3 mg into the muscle daily as needed (anaphylaxis/allergic reaction). 08/24/16   Diallo, Earna Coder, MD  HYDROcodone-acetaminophen (NORCO) 5-325 MG tablet Take 0.5 tablets by mouth every 6 (six) hours as needed for moderate pain. 11/09/21   Virgie Dad, MD  hydrOXYzine (VISTARIL) 25 MG capsule Take 25 mg by mouth every morning.    [provider]  lactose free nutrition (BOOST) LIQD Take 237 mLs by mouth daily.    [provider]  LORazepam (ATIVAN) 0.5 MG tablet Take 1 tablet (0.5 mg total) by mouth every 8 (eight) hours as needed for anxiety. 11/09/21   Virgie Dad, MD  Melatonin 3 MG TABS Take 3 mg by mouth at bedtime as needed (sleep).    [provider]  memantine (NAMENDA) 10 MG tablet Take 10 mg by mouth 2 (two) times daily.     [provider]  Menthol, Topical Analgesic, (BIOFREEZE) 4 % GEL Apply topically 4 (four) times daily as needed.    [provider]  Multiple Vitamins-Minerals (PRESERVISION AREDS) TABS Take 2 tablets by mouth daily.    [provider]  Multiple Vitamins-Minerals (THEREMS-M) TABS Take 1 tablet by mouth daily.    [provider]  polyethylene glycol (MIRALAX / GLYCOLAX) packet Take 17 g by mouth every other day.    [provider]  QUEtiapine (SEROQUEL) 25 MG tablet Take 25 mg by mouth at bedtime.    [provider]  sennosides-docusate sodium (SENOKOT-S) 8.6-50 MG tablet Take 2 tablets by mouth at bedtime.    [provider]  sertraline (ZOLOFT) 50 MG tablet Take 75 mg by mouth daily. 04/09/21   [provider]    Physical Exam: Constitutional: Moderately built and nourished. Vitals:   11/07/2021 1930 10/21/2021 2037  BP: (!) 63/35 97/86  Pulse: (!) 35 (!) 135  Resp: (!) 41 (!) 35  Temp: (!) 97.5 F (36.4 C)   TempSrc: Temporal   SpO2: 92% 95%   Eyes: Anicteric no  pallor. ENMT: No discharge from the ears eyes nose and mouth. Neck: No neck rigidity. Respiratory: No rhonchi or crepitations. Cardiovascular: S1-S2 heard. Abdomen: Soft nontender bowel sound present. Musculoskeletal: Right upper extremity splint. Skin: Multiple ecchymotic areas. Neurologic: Patient is unresponsive pupils are reacting.  Awake does not follow commands. Psychiatric: Unresponsive.   Labs on Admission: I have personally reviewed following labs and imaging studies  CBC: Recent Labs  Lab 11/08/21 0845 11/14/2021 1952  WBC 9.8 13.1*  NEUTROABS  --  10.6*  HGB 13.2 13.6  HCT 39.9 40.7  MCV 92.6 93.8  PLT PLATELET CLUMPS NOTED ON SMEAR, UNABLE TO ESTIMATE 169  Basic Metabolic Panel: Recent Labs  Lab 11/08/21 0845  NA 135  K 5.6*  CL 102  CO2 28  GLUCOSE 101*  BUN 21  CREATININE 0.78  CALCIUM 10.0   GFR: Estimated Creatinine Clearance: 31.7 mL/min (by C-G formula based on SCr of 0.78 mg/dL). Liver Function Tests: Recent Labs  Lab 11/08/21 0845  AST 60*  ALT 34  ALKPHOS 61  BILITOT 1.5*  PROT 6.7  ALBUMIN 3.7   No results for input(s): LIPASE, AMYLASE in the last 168 hours. No results for input(s): AMMONIA in the last 168 hours. Coagulation Profile: Recent Labs  Lab 11/08/21 0845  INR 1.0   Cardiac Enzymes: Recent Labs  Lab 11/08/21 0845  CKTOTAL 117   BNP (last 3 results) No results for input(s): PROBNP in the last 8760 hours. HbA1C: No results for input(s): HGBA1C in the last 72 hours. CBG: No results for input(s): GLUCAP in the last 168 hours. Lipid Profile: No results for input(s): CHOL, HDL, LDLCALC, TRIG, CHOLHDL, LDLDIRECT in the last 72 hours. Thyroid Function Tests: No results for input(s): TSH, T4TOTAL, FREET4, T3FREE, THYROIDAB in the last 72 hours. Anemia Panel: No results for input(s): VITAMINB12, FOLATE, FERRITIN, TIBC, IRON, RETICCTPCT in the last 72 hours. Urine analysis:    Component Value Date/Time   COLORURINE  YELLOW 11/08/2021 0845   APPEARANCEUR CLEAR 11/08/2021 0845   LABSPEC 1.015 11/08/2021 0845   PHURINE 7.5 11/08/2021 0845   GLUCOSEU NEGATIVE 11/08/2021 0845   HGBUR NEGATIVE 11/08/2021 0845   BILIRUBINUR NEGATIVE 11/08/2021 0845   KETONESUR NEGATIVE 11/08/2021 0845   PROTEINUR NEGATIVE 11/08/2021 0845   NITRITE NEGATIVE 11/08/2021 0845   LEUKOCYTESUR NEGATIVE 11/08/2021 0845   Sepsis Labs: @LABRCNTIP (procalcitonin:4,lacticidven:4) ) Recent Results (from the past 240 hour(s))  Resp Panel by RT-PCR (Flu A&B, Covid) Nasopharyngeal Swab     Status: None   Collection Time: 11/08/21  8:45 AM   Specimen: Nasopharyngeal Swab; Nasopharyngeal(NP) swabs in vial transport medium  Result Value Ref Range Status   SARS Coronavirus 2 by RT PCR NEGATIVE NEGATIVE Final    Comment: (NOTE) SARS-CoV-2 target nucleic acids are NOT DETECTED.  The SARS-CoV-2 RNA is generally detectable in upper respiratory specimens during the acute phase of infection. The lowest concentration of SARS-CoV-2 viral copies this assay can detect is 138 copies/mL. A negative result does not preclude SARS-Cov-2 infection and should not be used as the sole basis for treatment or other patient management decisions. A negative result may occur with  improper specimen collection/handling, submission of specimen other than nasopharyngeal swab, presence of viral mutation(s) within the areas targeted by this assay, and inadequate number of viral copies(<138 copies/mL). A negative result must be combined with clinical observations, patient history, and epidemiological information. The expected result is Negative.  Fact Sheet for Patients:  11/17/2021  Fact Sheet for Healthcare Providers:  BloggerCourse.com  This test is no t yet approved or cleared by the [employer] FDA and  has been authorized for detection and/or diagnosis of SARS-CoV-2 by FDA under an Emergency Use  Authorization (EUA). This EUA will remain  in effect (meaning this test can be used) for the duration of the COVID-19 declaration under Section 564(b)(1) of the Act, 21 U.S.C.section 360bbb-3(b)(1), unless the authorization is terminated  or revoked sooner.       Influenza A by PCR NEGATIVE NEGATIVE Final   Influenza B by PCR NEGATIVE NEGATIVE Final    Comment: (NOTE) The Xpert Xpress SARS-CoV-2/FLU/RSV plus assay is intended as an aid  in the diagnosis of influenza from Nasopharyngeal swab specimens and should not be used as a sole basis for treatment. Nasal washings and aspirates are unacceptable for Xpert Xpress SARS-CoV-2/FLU/RSV testing.  Fact Sheet for Patients: EntrepreneurPulse.com.au  Fact Sheet for Healthcare Providers: IncredibleEmployment.be  This test is not yet approved or cleared by the [place] FDA and has been authorized for detection and/or diagnosis of SARS-CoV-2 by FDA under an Emergency Use Authorization (EUA). This EUA will remain in effect (meaning this test can be used) for the duration of the COVID-19 declaration under Section 564(b)(1) of the Act, 21 U.S.C. section 360bbb-3(b)(1), unless the authorization is terminated or revoked.  Performed at [facility], [address], [place], [place] [address]      Radiological Exams on Admission: No results found.  EKG: Independently reviewed.  SVT.  Assessment/Plan Principal Problem:   Acute encephalopathy    SIRS -patient is tachycardic hypotensive with significantly elevated lactic acid and acute renal failure.  Because of which is not clear.  At this time patient's next of kin and patient decision maker has requested only comfort measures. Acute renal failure could be from prerenal causes. Acute encephalopathy could be from SIRS and metabolic reasons. History of dementia presently encephalopathy. History of hypertension presently hypotensive.  Plan  -at this time patient's next of kin who is patient's niece lives in [place] over the phone requested that patient to be placed on comfort measures and no further labs or tests.  Patient was started on Ativan and morphine for comfort measures.   Since patient has SIRS with acute renal failure encephalopathy and multiorgan failure will need inpatient status.   DVT prophylaxis: SCDs. Code Status: DNR as discussed with patient's niece. Family Communication: Patient's niece. Disposition Plan: Likely terminal. Consults called: Palliative care. Admission status: Inpatient.   Rise Patience MD [facility] Pager [phone].  If 7PM-7AM, please contact night-coverage [email or web address] Password [facility]  11/01/2021, 8:58 PM

## 2021-11-11 ENCOUNTER — Ambulatory Visit (HOSPITAL_BASED_OUTPATIENT_CLINIC_OR_DEPARTMENT_OTHER): Payer: Medicare PPO | Admitting: Orthopaedic Surgery

## 2021-11-21 NOTE — ED Notes (Addendum)
Toniann Fail MD returned page, advised of pt condition, to come see pt. This RN & Eugenio Hoes at bedside when pt went asystole 0022; TOD 0022

## 2021-11-21 NOTE — ED Notes (Signed)
This RN attempted to call POA Agustina Witzke [phone], no answer. MD Toniann Fail paged to update family as able.

## 2021-11-21 NOTE — Progress Notes (Signed)
Chaplain responded to request from pt's nurse to sit with pt who was in the state of actively dying so she wouldn't die alone.  Chaplain attended patient at bedside until she died.  Vernell Morgans Chaplain

## 2021-11-21 NOTE — ED Notes (Signed)
Pt's cardiac rhythm noted to be sinus brady. This RN & Irving Burton RN went in to assess pt for pulse, none found. Pt appears to be in PEA. Admitting MD paged regarding same.

## 2021-11-21 DEATH — deceased

## 2021-11-25 ENCOUNTER — Ambulatory Visit (HOSPITAL_BASED_OUTPATIENT_CLINIC_OR_DEPARTMENT_OTHER): Payer: Medicare PPO | Admitting: Orthopaedic Surgery

## 2022-02-21 ENCOUNTER — Encounter (INDEPENDENT_AMBULATORY_CARE_PROVIDER_SITE_OTHER): Payer: Medicare PPO | Admitting: Ophthalmology
